# Patient Record
Sex: Female | Born: 1960 | Race: White | Hispanic: No | Marital: Married | Smoking: Current some day smoker
Health system: Southern US, Community
[De-identification: ages and names within clinical notes are randomized; demographics above are authoritative.]

## PROBLEM LIST (undated history)

## (undated) DIAGNOSIS — F329 Major depressive disorder, single episode, unspecified: Secondary | ICD-10-CM

## (undated) DIAGNOSIS — N189 Chronic kidney disease, unspecified: Secondary | ICD-10-CM

## (undated) DIAGNOSIS — E119 Type 2 diabetes mellitus without complications: Secondary | ICD-10-CM

## (undated) DIAGNOSIS — R21 Rash and other nonspecific skin eruption: Secondary | ICD-10-CM

## (undated) DIAGNOSIS — Z9981 Dependence on supplemental oxygen: Secondary | ICD-10-CM

## (undated) DIAGNOSIS — Z7901 Long term (current) use of anticoagulants: Secondary | ICD-10-CM

## (undated) DIAGNOSIS — Z9989 Dependence on other enabling machines and devices: Secondary | ICD-10-CM

## (undated) DIAGNOSIS — J449 Chronic obstructive pulmonary disease, unspecified: Secondary | ICD-10-CM

## (undated) DIAGNOSIS — K219 Gastro-esophageal reflux disease without esophagitis: Secondary | ICD-10-CM

## (undated) DIAGNOSIS — M549 Dorsalgia, unspecified: Secondary | ICD-10-CM

## (undated) DIAGNOSIS — I10 Essential (primary) hypertension: Secondary | ICD-10-CM

## (undated) DIAGNOSIS — J45909 Unspecified asthma, uncomplicated: Secondary | ICD-10-CM

## (undated) DIAGNOSIS — Z9229 Personal history of other drug therapy: Secondary | ICD-10-CM

## (undated) DIAGNOSIS — R519 Headache, unspecified: Secondary | ICD-10-CM

## (undated) DIAGNOSIS — J42 Unspecified chronic bronchitis: Secondary | ICD-10-CM

## (undated) DIAGNOSIS — I509 Heart failure, unspecified: Secondary | ICD-10-CM

## (undated) DIAGNOSIS — G4733 Obstructive sleep apnea (adult) (pediatric): Secondary | ICD-10-CM

## (undated) DIAGNOSIS — M51369 Other intervertebral disc degeneration, lumbar region without mention of lumbar back pain or lower extremity pain: Secondary | ICD-10-CM

## (undated) DIAGNOSIS — F32A Depression, unspecified: Secondary | ICD-10-CM

## (undated) DIAGNOSIS — F419 Anxiety disorder, unspecified: Secondary | ICD-10-CM

## (undated) DIAGNOSIS — I739 Peripheral vascular disease, unspecified: Secondary | ICD-10-CM

## (undated) DIAGNOSIS — R51 Headache: Secondary | ICD-10-CM

## (undated) DIAGNOSIS — G8929 Other chronic pain: Secondary | ICD-10-CM

## (undated) DIAGNOSIS — I519 Heart disease, unspecified: Secondary | ICD-10-CM

## (undated) DIAGNOSIS — M5136 Other intervertebral disc degeneration, lumbar region: Secondary | ICD-10-CM

## (undated) HISTORY — DX: Peripheral vascular disease, unspecified: I73.9

## (undated) HISTORY — DX: Morbid (severe) obesity due to excess calories: E66.01

## (undated) HISTORY — DX: Chronic obstructive pulmonary disease, unspecified: J44.9

## (undated) HISTORY — PX: TONSILLECTOMY: SUR1361

## (undated) HISTORY — PX: BREAST BIOPSY: SHX20

## (undated) HISTORY — DX: Anxiety disorder, unspecified: F41.9

## (undated) HISTORY — PX: CORONARY ANGIOPLASTY: SHX604

## (undated) HISTORY — DX: Heart failure, unspecified: I50.9

## (undated) HISTORY — DX: Major depressive disorder, single episode, unspecified: F32.9

## (undated) HISTORY — DX: Gastro-esophageal reflux disease without esophagitis: K21.9

## (undated) HISTORY — DX: Depression, unspecified: F32.A

## (undated) HISTORY — PX: CHOLECYSTECTOMY: SHX55

---

## 1998-10-18 ENCOUNTER — Inpatient Hospital Stay (HOSPITAL_COMMUNITY): Admission: EM | Admit: 1998-10-18 | Discharge: 1998-10-22 | Payer: Self-pay | Admitting: Emergency Medicine

## 1998-10-18 ENCOUNTER — Encounter: Payer: Self-pay | Admitting: Surgery

## 1998-10-20 ENCOUNTER — Encounter: Payer: Self-pay | Admitting: Surgery

## 1998-11-30 ENCOUNTER — Ambulatory Visit (HOSPITAL_COMMUNITY): Admission: RE | Admit: 1998-11-30 | Discharge: 1998-11-30 | Payer: Self-pay | Admitting: Surgery

## 1998-11-30 ENCOUNTER — Encounter: Payer: Self-pay | Admitting: Surgery

## 1999-07-11 ENCOUNTER — Observation Stay (HOSPITAL_COMMUNITY): Admission: EM | Admit: 1999-07-11 | Discharge: 1999-07-12 | Payer: Self-pay | Admitting: Emergency Medicine

## 1999-07-26 ENCOUNTER — Encounter: Payer: Self-pay | Admitting: Family Medicine

## 1999-07-26 ENCOUNTER — Encounter: Admission: RE | Admit: 1999-07-26 | Discharge: 1999-07-26 | Payer: Self-pay | Admitting: Family Medicine

## 1999-08-21 ENCOUNTER — Encounter: Admission: RE | Admit: 1999-08-21 | Discharge: 1999-11-19 | Payer: Self-pay | Admitting: Family Medicine

## 1999-08-21 ENCOUNTER — Ambulatory Visit: Admission: RE | Admit: 1999-08-21 | Discharge: 1999-08-21 | Payer: Self-pay | Admitting: Critical Care Medicine

## 1999-10-04 ENCOUNTER — Encounter: Admission: RE | Admit: 1999-10-04 | Discharge: 1999-10-04 | Payer: Self-pay | Admitting: Family Medicine

## 1999-10-04 ENCOUNTER — Encounter: Payer: Self-pay | Admitting: Family Medicine

## 2000-02-17 ENCOUNTER — Ambulatory Visit (HOSPITAL_BASED_OUTPATIENT_CLINIC_OR_DEPARTMENT_OTHER): Admission: RE | Admit: 2000-02-17 | Discharge: 2000-02-17 | Payer: Self-pay | Admitting: Family Medicine

## 2000-09-16 ENCOUNTER — Encounter: Payer: Self-pay | Admitting: Family Medicine

## 2000-09-16 ENCOUNTER — Encounter: Admission: RE | Admit: 2000-09-16 | Discharge: 2000-09-16 | Payer: Self-pay | Admitting: Family Medicine

## 2000-12-18 ENCOUNTER — Inpatient Hospital Stay (HOSPITAL_COMMUNITY): Admission: AD | Admit: 2000-12-18 | Discharge: 2000-12-21 | Payer: Self-pay | Admitting: Cardiology

## 2000-12-18 ENCOUNTER — Encounter: Payer: Self-pay | Admitting: Cardiology

## 2000-12-20 ENCOUNTER — Encounter: Payer: Self-pay | Admitting: Cardiology

## 2001-04-09 ENCOUNTER — Ambulatory Visit: Admission: RE | Admit: 2001-04-09 | Discharge: 2001-04-09 | Payer: Self-pay

## 2001-08-14 ENCOUNTER — Ambulatory Visit (HOSPITAL_COMMUNITY): Admission: RE | Admit: 2001-08-14 | Discharge: 2001-08-14 | Payer: Self-pay | Admitting: Cardiology

## 2001-08-15 ENCOUNTER — Ambulatory Visit (HOSPITAL_COMMUNITY): Admission: RE | Admit: 2001-08-15 | Discharge: 2001-08-15 | Payer: Self-pay | Admitting: Critical Care Medicine

## 2001-08-15 ENCOUNTER — Encounter: Payer: Self-pay | Admitting: Critical Care Medicine

## 2001-08-22 ENCOUNTER — Inpatient Hospital Stay (HOSPITAL_COMMUNITY): Admission: EM | Admit: 2001-08-22 | Discharge: 2001-08-25 | Payer: Self-pay | Admitting: Critical Care Medicine

## 2001-08-22 ENCOUNTER — Encounter: Payer: Self-pay | Admitting: Critical Care Medicine

## 2002-06-29 ENCOUNTER — Encounter: Payer: Self-pay | Admitting: Neurosurgery

## 2002-06-29 ENCOUNTER — Encounter: Admission: RE | Admit: 2002-06-29 | Discharge: 2002-06-29 | Payer: Self-pay | Admitting: Neurosurgery

## 2002-06-29 ENCOUNTER — Encounter: Payer: Self-pay | Admitting: Radiology

## 2002-07-16 ENCOUNTER — Encounter: Payer: Self-pay | Admitting: Neurosurgery

## 2002-07-16 ENCOUNTER — Encounter: Admission: RE | Admit: 2002-07-16 | Discharge: 2002-07-16 | Payer: Self-pay | Admitting: Neurosurgery

## 2002-07-27 ENCOUNTER — Encounter: Admission: RE | Admit: 2002-07-27 | Discharge: 2002-10-25 | Payer: Self-pay | Admitting: Endocrinology

## 2002-10-15 ENCOUNTER — Encounter: Admission: RE | Admit: 2002-10-15 | Discharge: 2002-10-15 | Payer: Self-pay | Admitting: Surgery

## 2002-10-15 ENCOUNTER — Encounter: Payer: Self-pay | Admitting: Surgery

## 2003-01-05 ENCOUNTER — Encounter: Admission: RE | Admit: 2003-01-05 | Discharge: 2003-01-27 | Payer: Self-pay | Admitting: Surgery

## 2003-01-14 ENCOUNTER — Encounter: Admission: RE | Admit: 2003-01-14 | Discharge: 2003-04-14 | Payer: Self-pay | Admitting: Endocrinology

## 2003-01-27 ENCOUNTER — Encounter: Admission: RE | Admit: 2003-01-27 | Discharge: 2003-01-27 | Payer: Self-pay | Admitting: Surgery

## 2003-02-03 ENCOUNTER — Encounter: Admission: RE | Admit: 2003-02-03 | Discharge: 2003-02-03 | Payer: Self-pay | Admitting: Surgery

## 2003-03-29 ENCOUNTER — Inpatient Hospital Stay (HOSPITAL_COMMUNITY): Admission: RE | Admit: 2003-03-29 | Discharge: 2003-04-02 | Payer: Self-pay | Admitting: Surgery

## 2003-05-20 ENCOUNTER — Encounter: Admission: RE | Admit: 2003-05-20 | Discharge: 2003-08-18 | Payer: Self-pay | Admitting: Endocrinology

## 2004-01-30 HISTORY — PX: ROUX-EN-Y GASTRIC BYPASS: SHX1104

## 2004-12-08 ENCOUNTER — Ambulatory Visit: Payer: Self-pay | Admitting: Cardiology

## 2004-12-14 ENCOUNTER — Ambulatory Visit: Payer: Self-pay

## 2004-12-15 ENCOUNTER — Ambulatory Visit: Payer: Self-pay

## 2004-12-18 ENCOUNTER — Ambulatory Visit: Payer: Self-pay | Admitting: Pulmonary Disease

## 2005-01-04 ENCOUNTER — Ambulatory Visit (HOSPITAL_COMMUNITY): Admission: RE | Admit: 2005-01-04 | Discharge: 2005-01-04 | Payer: Self-pay | Admitting: Cardiology

## 2005-01-04 ENCOUNTER — Ambulatory Visit: Payer: Self-pay | Admitting: Cardiology

## 2007-02-03 ENCOUNTER — Other Ambulatory Visit: Payer: Self-pay | Admitting: Emergency Medicine

## 2007-02-04 ENCOUNTER — Ambulatory Visit: Payer: Self-pay | Admitting: Emergency Medicine

## 2007-02-04 ENCOUNTER — Inpatient Hospital Stay (HOSPITAL_COMMUNITY): Admission: EM | Admit: 2007-02-04 | Discharge: 2007-02-08 | Payer: Self-pay | Admitting: Emergency Medicine

## 2007-03-13 DIAGNOSIS — R51 Headache: Secondary | ICD-10-CM

## 2007-03-13 DIAGNOSIS — I519 Heart disease, unspecified: Secondary | ICD-10-CM | POA: Insufficient documentation

## 2007-03-13 DIAGNOSIS — E119 Type 2 diabetes mellitus without complications: Secondary | ICD-10-CM | POA: Insufficient documentation

## 2007-03-13 DIAGNOSIS — I10 Essential (primary) hypertension: Secondary | ICD-10-CM | POA: Insufficient documentation

## 2007-03-13 DIAGNOSIS — E669 Obesity, unspecified: Secondary | ICD-10-CM

## 2007-03-13 DIAGNOSIS — R519 Headache, unspecified: Secondary | ICD-10-CM | POA: Insufficient documentation

## 2007-03-13 DIAGNOSIS — G4730 Sleep apnea, unspecified: Secondary | ICD-10-CM | POA: Insufficient documentation

## 2007-03-13 DIAGNOSIS — I200 Unstable angina: Secondary | ICD-10-CM

## 2007-03-13 DIAGNOSIS — I509 Heart failure, unspecified: Secondary | ICD-10-CM | POA: Insufficient documentation

## 2007-03-13 DIAGNOSIS — K219 Gastro-esophageal reflux disease without esophagitis: Secondary | ICD-10-CM | POA: Insufficient documentation

## 2007-04-16 ENCOUNTER — Encounter: Admission: RE | Admit: 2007-04-16 | Discharge: 2007-04-16 | Payer: Self-pay | Admitting: Family Medicine

## 2008-01-16 ENCOUNTER — Encounter: Admission: RE | Admit: 2008-01-16 | Discharge: 2008-01-16 | Payer: Self-pay | Admitting: Family Medicine

## 2008-09-23 ENCOUNTER — Encounter: Admission: RE | Admit: 2008-09-23 | Discharge: 2008-09-23 | Payer: Self-pay | Admitting: Family Medicine

## 2008-09-29 ENCOUNTER — Encounter: Admission: RE | Admit: 2008-09-29 | Discharge: 2008-11-15 | Payer: Self-pay | Admitting: Family Medicine

## 2009-09-23 ENCOUNTER — Encounter: Admission: RE | Admit: 2009-09-23 | Discharge: 2009-09-23 | Payer: Self-pay | Admitting: Gastroenterology

## 2009-09-28 ENCOUNTER — Ambulatory Visit (HOSPITAL_COMMUNITY): Admission: RE | Admit: 2009-09-28 | Discharge: 2009-09-28 | Payer: Self-pay | Admitting: Gastroenterology

## 2010-02-18 ENCOUNTER — Encounter: Payer: Self-pay | Admitting: Surgery

## 2010-02-19 ENCOUNTER — Encounter: Payer: Self-pay | Admitting: Surgery

## 2010-06-13 NOTE — H&P (Signed)
NAMEDENNICE, TINDOL                ACCOUNT NO.:  [id number]   MEDICAL RECORD NO.:  [id number]          PATIENT TYPE:  INP   LOCATION:  1825                         FACILITY:  [facility]   PHYSICIAN:  Leslye Peer, MD    DATE OF BIRTH:  03-12-1960   DATE OF ADMISSION:  02/04/2007  DATE OF DISCHARGE:                              HISTORY & PHYSICAL   HISTORY OF PRESENT ILLNESS:  Ms. Ketchem is a 50 year old Caucasian lady  with a history of diabetes mellitus, hypertension, obstructive sleep  apnea, diastolic dysfunction with ejection fraction of 60%, minimal  coronary artery disease with last catheterization done in December 2006,  showing 30% stenosis of the circumflex artery, morbid obesity with BMI  of 48, and tobacco abuse; was referred to the emergency department of  the [facility] by her primary care doctor, Dr. Gerri Spore.  It all  started with the flu-like symptoms about a week and half ago.  She has  fever, chills, sweating, crampy pain all over her body, especially in  the abdomen and the legs.  She also has increased frequency of urination  and burning micturition.  For the last 24 hours, she was also delirious  according to her husband's own words.  She was also very weak and dizzy,  but there is no loss of consciousness.  She also has a right flank pain,  which is achy in nature, which is 10/10 and radiates to the groin.  There is no aggravating or relieving factor.  She has nausea, but no  vomiting.  Her bowel movement is okay.  She also complains of having a  continuous vaginal bleeding since January 13, 2007, which is about 20  days.  The last menstrual period prior to this bleeding was about 2  years ago.  She does have a history of regular menses in the past.   ALLERGY HISTORY:  She is allergic to CODEINE, which makes her sick.   PAST MEDICAL HISTORY:  1. Diabetes mellitus.  2. Hypertension.  3. Obstructive sleep apnea.  4. Diastolic dysfunction with EF of 60%.  5. Minimal CAD with 30% stenosis of the circumflex artery per cardiac      catheterization on December 2006.  6. Morbid obesity.  7. Tobacco abuse.  8. GERD.  9. Fatty liver.  10.Status post cholecystectomy and status post laparoscopic Roux-en-Y      gastric bypass.  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  12.Complex left ovarian cyst diagnosed in 2002, by transvaginal      ultrasound.   HOME MEDICATIONS:  Neither the patient nor her husband recall what  medication she takes and they will bring the list when her family comes  to the hospital next time.   SOCIAL HISTORY:  Ms. Brasington smokes about a pack per day and had been  doing so since last 10 years.  There is a normal regular use of alcohol.  There is no illicit drugs use.   FAMILY HISTORY:  Significant for her father who was deceased at 16  because of heart attack and mother who was deceased at 23s  because of  breast cancer.  Her one sister is healthy.  She does not have any  children.   REVIEW OF SYSTEMS:  According to history of present illness and in  addition positive for sick contact, her whole family was having flu-like  symptoms recently.  Some chest pain, shortness of breath with cough, and  on and off nature of sputum without any color.  There is no bleeding  from anywhere else and there is no rash.   PHYSICAL EXAMINATION:  VITAL SIGNS:  Temperature 102.1, respirations 28,  heart rate 127, and blood pressure 101/69.  Saturating 90 on room air.  GENERAL:  Toxic looking, sweaty, in mild to moderate distress.  ENT:  Occluded tongue, dehydrated oropharynx, but otherwise no any  discharge.  NECK:  No mass.  No cervical lymphadenopathy.  CHEST:  Bilaterally clear to auscultation.  No crepitation or wheezes.  HEART:  First and second heart sounds are normal.  Regular rate and  rhythm.  No rubs, murmurs, or gallops.  ABDOMEN:  Bowel sound is normal.  Soft and nontender.  No organomegaly.  No masses.  SKIN:  No rashes.   LYMPH NODE:  Negative in the cervical area and the axillary area.  NEUROLOGIC:  She is slightly confused, but easily arousable.  She is  oriented to person and to the situation, but not to time and place.  Motor examination, power is 5/5 of all 4 limbs.  Bilateral light touch  sensory examination is symmetrical and normal.  Cranial nerves II  through XII was normal.  Cerebellar signs:  Finger-to-nose bilaterally  is normal and symmetrical.  The meningeal irritation sign is negative.  Negative Brudzinski and Kernig signs.  Neck is supple.  Babinski sign is  bilaterally downgoing.   LABS ON ADMISSION:  Blood gas with pH of 7.84, PCO2 29.1, PO2 79,  bicarbonate 21.6, and serum creatinine of 2.1.  UA positive for orange  color; turbid in appearance; 100 glucose, moderate bilirubin, 15  ketones, moderate blood, 100 protein, positive nitrite, and large  leukocytes.  Urine microscopy has few squamous epithelial cells with WBC  of 21-50 and RBC 7-10 with bacteria many.  Her CBC with the diff; WBC  29.6, hemoglobin 15.2, hematocrit 44.6, platelets are 456,000,  neutrophils 92% with absolute neutrophil count 27.2, and absolute  monocyte count high at 1.2.  Her CMP; sodium 133, potassium 3.3,  chloride 99, bicarb 23, and glucose 288, BUN 28, creatinine 2, total  bilirubin 1.4, alkaline phosphatase 64, AST 15, ALT 21, total protein  7.6, blood albumin 3.1, and calcium 8.7.  LDH 139, blood acetaminophen  level less than 10, salicylate level less than 4, alcohol level less  than 5.  Urine drug screen negative.  Urine tricyclic screen also  negative.  Venous lactic acid level 2.  First set of cardiac enzymes of  troponin 0.4, total CK 102, CK-MB 3.5 with relative index of 3.4.  BNP  38 and random cortisol level 18.1.  Portable chest x-ray showed  symmetric and bibasilar opacities.   ASSESSMENT AND PLAN:  1. Severe sepsis.  A 50 year old lady coming with urinary frequency,      dysuria, right flank  pain radiating to groin, fever, chills,      sweating, nausea, increased WBC count, increased WBC in urine,      positive leukocyte esterase and plenty of bacteria in urine, all      are in favor of urosepsis.  She also has a little  confusion and is      oriented only to person and situation as well as she has rise of      creatinine from 1.21 yesterday to 2 today and fluctuating systolic      blood pressure between 90-150, indicating some evidence of organ      dysfunction including the kidney, brain, and the cardiovascular      system.  She is not floridly in shock, but she is at the brink.  So      we will send blood cultures x2, urine culture, sputum program, and      stain culture.  We will also send urine antigen for pneumococcus      and Legionella.  She also has mild bibasilar opacities, which can      be infective in nature, but I do not believe it is the main cause      of the septic picture.  I will treat her presently empirically for      urosepsis, but we will also cover for the possible pneumonia with      Levaquin and switch the antibiotics following the sensitivity      result.  I will also put her on ICU with sepsis protocol and let me      remind if she is admitted to the ICU.  We will also check cardiac      enzymes and check an EKG once she is in the unit and also given      aspirin.  We will also give her moderate amount of fluid, and the      target is central venous pressure is between 8-12 and adjust the      fluid accordingly.  If the target could not be obtained after the      fluids, we will start her on pressors.  I will also give her oxygen      and I will put her on CPAP at night time.  2. Vaginal bleeding.  Her hemoglobin is okay at presentation, but she      is bleeding for the last 20 days and her last menstrual period was      2 years prior to this one.  So, I will just type and cross 2 units      of packed RBC, but we will not transfuse her right now and  my      threshold would be hemoglobin of 8 for transfusion.  She does need      a Gynecology referral once she is stable most probably at the time      of discharge.  3. Sleep apnea.  She will continue with the CPAP at night time.  4. Chronic obstructive pulmonary disease.  She will use the albuterol      and Atrovent nebulizers p.r.n. and I will also start her on Spiriva      inhaler.  5. Diabetes.  I will put her on sliding scale sensitive regimen.  6. Tobacco abuse.  She need some form of counseling and motivation for      smoking cessation after she is stable.  7. Hypertension and diastolic dysfunction.  We will follow her blood      pressure.  8. Gastroesophageal esophageal reflux disease.  We will give her      Protonix.  9. Degenerative spinal disease.  We will not be that moderate to give      her pain medications, as her blood pressure  still runs sometimes in      the lower range of normal.  10.Prophylaxis.  We will put her on Lovenox for DVT prophylaxis and      also put her on Protonix for a stress ulcer.  We will also check      lipase and amylase just to rule out pancreatitis for the cause of      abdominal pain, which is very unlikely.      Jason Coop, MD  Electronically Signed      Leslye Peer, MD  Electronically Signed    YP/MEDQ  D:  02/04/2007  T:  02/05/2007  Job:  [id number]   cc:   Otilio Connors. Gerri Spore, M.D.  Leslye Peer, MD

## 2010-06-13 NOTE — Discharge Summary (Signed)
NAMETACHE, BOBST                ACCOUNT NO.:  [id number]   MEDICAL RECORD NO.:  [id number]          PATIENT TYPE:  INP   LOCATION:  2622                         FACILITY:  [facility]   PHYSICIAN:  Nelda Bucks, MD DATE OF BIRTH:  1960/10/15   DATE OF ADMISSION:  02/04/2007  DATE OF DISCHARGE:  02/08/2007                               DISCHARGE SUMMARY   DISCHARGE DIAGNOSES:  1. Septic shock with urosepsis, possibly pyelonephritis.  2. Acute renal failure.  3. Vaginal bleeding unknown cause.  4. Diabetes mellitus.   OTHER INACTIVE DIAGNOSES:  1. Hypertension.  2. Obstructive sleep apnea.  3. Diastolic dysfunction with ejection fraction of 60%.  4. Minimal coronary artery disease with 30% stenosis of the circumflex      artery with cardiac catheterization done in December 2006.  5. Morbid obesity with body mass index of 48.  6. Tobacco abuse.  7. Gastroesophageal reflux disease.  8. Fatty liver, status post cholecystectomy and status post      laparoscopic Roux-en-Y gastric bypass.  9. Degenerative joint disease of the spine.   DISCHARGE MEDICATIONS:  1. Prevacid 30 mg p.o. b.i.d.  2. Ciprofloxacin 500 mg p.o. b.i.d. for a total of 7 days.  3. Spiriva 18 mcg inhalation p.o. daily.  4. Neurontin 600 mg p.o. t.i.d.  5. Percocet 5/325 mg p.o. p.r.n. q.i.d.  6. Promethazine 25 mg p.o. p.r.n. for nausea and vomiting every 6      hours.  7. Cymbalta 60 mg p.o. daily.  8. Metformin XR 500 mg p.o. 2 pills b.i.d.  9. NitroQuick 0.4 mg p.o. p.r.n. every 5 minutes x3.  10.Tizanidine HCl 2 mg p.o. b.i.d.  11.Continue her home daily vitamin B12 and iron pills.  12.Tylenol 650 mg p.o. p.r.n. for temperature more than 108 Fahrenheit      every 6 hours.   DISPOSITION AND FOLLOWUP:  Ms. Ryer is discharged home.  She will  follow up with her primary care doctor on coming Monday which is on  February 10, 2007.  His primary care doctor needs to follow up on certain  issues including  resolution of the septic shock and any urinary symptoms  following on blood pressure, temperature, and heart rate.  Ms. Gonsalves  also needs a CBC on her followup visit with BMET.  On CBC, please check  on WBC count as well as hemoglobin.  On BMET, please check on potassium  and creatinine.   She needs to be seen by Dr. Noland Fordyce from [facility] for having  vaginal bleed since January 13, 2007, after not having periods for  about 2 years.  During this hospitalization, she had transvaginal  ultrasound done which shows normal left ovary and uterus, right ovary  not visualized.  No masses or free fluid.  Her TSH was normal at 1.046,  LH at 2.2, and FSH at 4.2.  There is a concern for cancer because of the  postmenopausal bleed.   Ms. Mount is instructed to call the doctor's office if she has a  temperature of 101, and this has been greatly emphasized to  her.   PROCEDURES PERFORMED DURING THIS HOSPITALIZATION:  Renal ultrasound done  on February 05, 2007, is negative for any hydronephrosis, but there is  fatty infiltration of the liver.  Urine culture was positive for E.  coli, which the patient was sensitive to cefazolin, ceftriaxone,  ciprofloxacin, gentamicin, levofloxacin, nitrofurantoin, tobramycin, and  resistant to Bactrim and ampicillin.  Her blood cultures x2 was also  positive for E. coli, which was resistant to Bactrim and ampicillin, but  sensitive to a combination of ampicillin, sulbactam, cefazolin,  cefepime, cefotetan, ceftazidime, ceftriaxone, ciprofloxacin,  gentamicin, imipenem, Zosyn, and tobramycin.  She also had right IJ  central venous line placed on February 04, 2007, which was discharged on  February 08, 2007.   CONSULTATIONS:  Pharmacy PT/OT.   ADMITTING HISTORY AND PHYSICAL:  Ms. Chinchilla is a 50 year old Caucasian  lady with a history of diabetes mellitus, hypertension, obstructive  sleep apnea, diastolic dysfunction with EF of 60%, minimal coronary  artery  disease with last cath done in December 2006 with 30% stenosis of  the circumflex artery, morbid obesity with BMI of 48, tobacco abuse, was  referred to the emergency department of [facility] by her primary  care doctor, Dr. Gerri Spore.  It also started with flu-like symptoms  about a week and a half ago.  She has fever, chills, sweating, and  crampy pain all over her body, especially in the abdomen and the legs.  She also has a decreased frequency of urination and burning micturition.  For the last 24 hours, she was also delirious according to her husband's  own words.  She was also very weak and dizzy.  There is no loss of  consciousness.  She also had right flank pain, which is achy in nature,  which is 10/10 and radiates to the groin.  There is no aggravating or  relieving factors.  She has nausea, but no vomiting.  Her bowel movement  is okay.  She also complains of having continuous vaginal bleed since  January 13, 2007, which is about 20 days prior to admission.  The last  menstrual period prior to this bleeding was about 2 years ago.  She does  have a history of irregular menses in the past.   PHYSICAL EXAMINATION:  VITAL SIGNS:  Temperature 102.1, respirations 28,  heart rate 127, and blood pressure 101/69.  Satting 90% on room air.  GENERAL:  She was toxic looking, sweaty, in mild to moderate distress.  ENT:  Tongue was coated and dehydrated, oropharynx with otherwise no any  discharge.  NECK:  No mass.  No cervical lymphadenopathy.  CHEST:  Bilaterally clear to auscultation.  No crackles or wheezes.  HEART:  First and second heart sounds are normal.  Regular rate and  rhythm.  No rubs.  Normal gallops.  ABDOMEN:  Bowel sounds normal.  Soft and nontender.  No organomegaly.  No masses.  SKIN:  No rashes.  LYMPH NODE:  Negative in the cervical and axillary area.  NEUROLOGIC:  Confused slightly, but easily arousable.  She is oriented  to person at this situation, but not to  time and place.  Motor  examination, power is 5/5 in all 4 limbs and bilateral light touch.  Sensory examination is symmetrical and normal.  Cranial nerves II  through XII are normal.  Cerebellar signs, finger-to-nose bilaterally  normal and symmetrical.  The meningeal irritation sign is negative.  Negative Babinski and Kernig sign.   LABS AT ADMISSION:  Blood gas;  pH 7.84, pCO2 of 29.1, pO2 of 79, bicarb  21.6, and creatinine 2.1.  UA positive for orange color turbid  appearance, no glucose, moderate bilirubin, 15 ketones, moderate blood,  positive nitrite, and large leukocytes.  Urine microscopy had a few  squamous epithelial cells with wbc of 21-50 and rbc of 7-15 with  bacteria many.  CBC with WBC of 29.6, hemoglobin 15.2, hematocrit 44.6,  platelets 456,000, and absolute neutrophil count 27.2.  Sodium 133,  potassium 3.3, chloride 99, bicarb 23, glucose 288, BUN 28, creatinine  2, total bilirubin 1.4, alk phos 64, AST 15, ALT 21, total protein 7.6,  albumin 3.1, calcium 8.7, LDH 139, acetaminophen level less than 10,  salicylate level less than 4, and alcohol level less than 5.  UDS  negative.  Urine tricyclic screen negative, venous lactic acid level 2,  cardiac enzymes first set within normal limits.  BNP 13 and random  cortisol 18.1.  Portable chest x-ray showed symmetric and bibasilar  opacities.   HOSPITAL COURSE:  1. Septic shock? Pyelonephritis:  The patient's blood pressure was low      as in the lower 80s systolic, but she did not require any      pressures, and she did overall well with IV fluids and antibiotics.      She was initially started on Levaquin because her WBC count was      persistently high.  Vancomycin and ceftazidime was also added.      Consequently, her urine culture and blood culture both were      positive for E. Coli.  On day 3 of admission, antibiotics were      changed to ciprofloxacin IV, which was one of the sensitive      antibiotics.  We did an  renal ultrasound, which was negative for      any obstruction or hydronephrosis.  We also checked random cortisol      level to rule out hypercortisolism as a cause of shock, but it was      normal.  We also checked cardiac enzymes which was normal.  Her EKG      was also not impressive.  Urine drug screen including acetaminophen      blood level and alcohol level was all within normal limits.  Urine      tricyclic screen was also within normal limits.  Her lactic acid      level was also within normal limits.  Her BNP was also within      normal limits.  Her amylase and lipase level was also within normal      limits.  We also did streptococcus pneumoniae urinary antigen and      Legionella urinary antigen, both were within normal limits.  2. Acute renal failure:  When she came, her creatinine was 2.1 and on      the day of discharge, her creatinine was 1.19 and it gradually      resolved with fluid repletion and treatment of shock.  3. Vaginal bleeding:  She had vaginal bleeding for about last 28 days      after not having periods for about 2 years.  Transvaginal      ultrasound was within normal limits except the right ovary was not      visualized.  The uterus was within normal limits.  The left ovary      was within normal limits.  We also obtained FSH, LH, and TSH level.  FSH and LH were at the lower limits of normal.  TSH was also      normal.  She will follow up with Dr. Noland Fordyce from [facility]      [facility] in 2 weeks to 1 month after discharge.  4. Diabetes mellitus type 2:  We did not continue her home oral      medication.  We rather started her on a sliding scale and also      added Lantus the dose of which has to be increased because of      higher CBGs.   LABS ON DISCHARGE:  WBC 16.7, hemoglobin 11.4, MCV 95.6, RDW 13.2, and  platelets 410,000.  Sodium 137, potassium 3.6, chloride 107, bicarb 23,  glucose 223, BUN 16, creatinine 1.19, and calcium 8.2.  UA normal  except  large amount of blood.  Urine microscopy, rbc 11-20 with wbc of 3-6.   VITALS ON DISCHARGE:  Temperature 96.6, pulse 73, respirations 22, and  blood pressure 150/76.  Satting 98% on room air.   CONDITION ON DISCHARGE:  Had 1 spike of fever of 100; otherwise making a  lot amount of urine and looking a lot better than at the time of  admission.      Jason Coop, MD  Electronically Signed      Nelda Bucks, MD  Electronically Signed    YP/MEDQ  D:  02/08/2007  T:  02/09/2007  Job:  [id number]   cc:   Otilio Connors. Gerri Spore, M.D.  Leslye Peer, MD  Lendon Colonel, MD  Nelda Bucks, MD

## 2010-06-16 NOTE — H&P (Signed)
[place]. [facility]  Patient:    Stephanie Peck, Stephanie Peck                       MRN: [id number] Adm. Date:  [id number] Attending:  Doug Peck Dictator:   Stephanie Peck, N.P. CC:         Stephanie Peck. Stephanie Peck, M.D./[facility]                         History and Physical  DATE OF BIRTH:  September 01, 2060.  HISTORY OF PRESENT ILLNESS:   Stephanie Peck is a pleasant obese 50 year old female  with positive family history of CAD and positive personal use tobacco.  She has a one week complaint of malaise with initial mid-back pain described as sharp and  associated with deep inspiration.  That discomfort lasted approximately two days, then resolved.  She had no associated radiation, discomfort or diaphoresis.  She has been complaining of nausea over the last week, more severe yesterday.  Dizziness over the weekend (two days earlier), which has also resolved.  She noted her hands swelling, but denied abdominal swelling nor pedal edema.  This morning, she awoke acutely short of breath with subsequent orthopnea.  Noted mild anterior chest tightness, thought  related to her shortness of breath.  Denied palpitations.  She reported to her primary M.D.s office where a chest x-ray revealed CHF.  EMS was summoned and she was given IV Lasix 90 mg and aspirin as well as sublingual nitrates.  She subsequently diuresed approximately three liters of urine and felt much improved. O2 sat improved to 96% room air from initial 88% room air and currently feeling  okay and pain-free.  CARDIAC RISK FACTORS:  Positive family history for CAD, positive ongoing tobacco use and obesity.  PAST MEDICAL HISTORY: 1. Diverticulosis. 2. Cholecystectomy. 3. Right breast biopsy, which was benign.  The patient denies a history of diabetes mellitus, thyroid disease nor cancer.  ALLERGIES:  CODEINE causing nausea; she has photo-sensitivity causing  hives.  MEDICATIONS:  Prevacid 30 mg p.o. q.d.  SOCIAL HISTORY:  She has been married for eight years without children. Tobacco: 1/2 to 2 packs q.d. for 20 years.  ETOH negative.  Caffeine not excessive.  The  patient works for [employer].  FAMILY HISTORY:  Father deceased, age 69; myocardial infarction.  He had prior bypass surgery, with a history of diabetes mellitus and hypertension.  Mother deceased, age 66; breast cancer.  Sister is alive and well.  REVIEW OF SYSTEMS:  Full upper dentures and partial lower.  Episodic dizziness associated with decreased p.o. intake.  Denies dysphagia with food or fluids. Negative melena, no bright red blood p.r.  Episodic constipation and diarrhea.  Does complain of nocturnal leg cramps/restless legs.  Negative arthritis. Denies pedal edema.  Denies orthopnea but does sleep with head of bed elevated somewhat. Rare episodic palpitations, nonsustained, and no associated symptoms.  Denies dysuria or hematuria.  PHYSICAL EXAMINATION:  GENERAL:  She is an obese, pleasantly conversant middle-aged female in no apparent distress.  Her husband is in attendance.  VITAL SIGNS:  Blood pressure 140/90 initially and subsequently 198/105, respiratory rate 24 and room air O2 saturation initially 88% and currently 96%  HEAD, EARS, EYES, NOSE AND THROAT:  Brisk bilateral carotid upstroke without bruit.  NECK:  No significant JVD,  no thyromegaly.  CHEST:  Left base crackles; otherwise clear.  Negative CVA tenderness. Inspiratory squeak, mid and upper right lobe.  CARDIAC:  Soft S4 at base; regular rate and rhythm.  Normal S1 and S2.  ABDOMEN:  Obese, nondistended, normoactive bowel sounds.  Negative abdominal aorta, renal nor femoral bruit.  Nontender to palpation.  EXTREMITIES:  +1-2/4 pedal, femoral, dorsalis pedis and posterior tibial. Negative pedal edema.  NEUROLOGIC:  Cranial nerves II-XII grossly intact; alert and oriented x  3.  GENITORECTAL:  Exam deferred.  LABORATORY:  Today - sodium 138, K 3.6, chloride 102, CO2 was 28, BUN 7, creatinine 0.7, glucose 109.  LFTs within normal range.  CBC revealed WBC elevated at 12.7, hemoglobin 14.7, platelets elevated at 499,000.  EKG reveals NSR at 96 beats/minute with T-wave inversion in AVL; no acute ischemic changes.  CK #1 was 457 with MB fraction 1.7 and Troponin-I of 0.03.  Chest x-ray reveals CHF.  IMPRESSION:  A 50 year old female with: 1. Acute congestive heart failure - with a history of pleuritic chest pain one eek prior; some anterior chest tightness this morning.  Cardiac risk factors include: obesity and positive family history of coronary artery disease and ongoing tobacco abuse.  Differential diagnoses includes myocardial ischemia and pulmonary emboli. 2. History of diverticulosis. 3. Borderline low potassium; likely exacerbated after diuresis. 4. Thrombocytosis with platelets of 399,000.  PLAN: 1. Admit to telemetry; rule out MI protocol with serial cardiac enzymes/daily EKG. Follow O2 saturations and diurese p.r.n. 2. Topical nitrates and aspirin. 3. Will plan for coronary angiography in the morning, with possible percutaneous intervention.  Diagnostic by Stephanie Peck with possible percutaneous intervention with Stephanie Peck, if necessary. The risks, potential complications, benefits and alternatives of the procedure ere discussed in detail.  [name] indicates her questions and concerns have been  addressed and is agreeable to proceed. 4. Will check D-dimer; if positive, will anticipate chest CT - rule out pulmonary emboli protocol if cath negative. 5. Supplement K. 6. Follow-up BMET and CBC in the morning. DD:  07/11/99 TD:  07/11/99 Job: [id number]

## 2010-06-16 NOTE — Discharge Summary (Signed)
Stephanie Peck, Stephanie Peck                          ACCOUNT NO.:  [id number]   MEDICAL RECORD NO.:  [id number]                   PATIENT TYPE:  INP   LOCATION:  0479                                 FACILITY:  [facility]   PHYSICIAN:  Selina Cooley, M.D.               DATE OF BIRTH:  08-20-1960   DATE OF ADMISSION:  08/22/2001  DATE OF DISCHARGE:  08/24/2001                                 DISCHARGE SUMMARY   DISCHARGE DIAGNOSES:  1. Nausea, vomiting and lower quadrant abdominal pain, probable viral     gastroenteritis.     a. Abdominal CT negative for diverticulitis, revealing fatty infiltration        of the liver.     b. Lipase and amylase normal.     c. C. difficile study negative x 1.     d. Blood cultures no growth to date.  2. Dehydration.  3. Severe hypokalemia.  Potassium was 2.6 on admission.  3.2 at the time of     discharge.  4. Dyspnea on exertion, chronic progressive.     a. Extensive workup including a CT of the chest on July 18 negative for        PE revealing mediastinal lymphadenopathy.     b. According to patient, formal sleep study revealing sleep apnea but no        need for CPAP.     c. Admission blood gas 7.55/33/74/29 96%.     d. Cardiac workup including catheterization in 2001 with normal coronary        arteries.  Echocardiogram revealing severe left ventricular diastolic        dysfunction, EF of 50 with mild diffuse hypokinesis.  5. Obesity.  6. History of hypertension.  7. Tobacco use.  8. Fatty liver by CT this admission.  9. Status post cholecystectomy.  10.      Diabetes mellitus type 2.  11.      Gastroesophageal reflux disease.  12.      Ventral hernia by exam and confirmed by CT this admission.  13.      Admission leukocytosis of 15.7.  14.      Intentional 30 pound weight loss.   DISCHARGE MEDICATIONS:  Increase potassium chloride to 40 mEq b.i.d.  No  other medication changes.  Prevacid 30 mg p.o. b.i.d., Celexa 40 mg p.o.  q.d., Glucophage XR  1000 mg p.o. b.i.d., Diovan 80 mg p.o. q.d., Clarinex 5  mg p.o. q.d., Reglan 10 mg p.o. b.i.d. p.r.n., Lasix 80 mg p.o. t.i.d. start  7/29, Zaroxolyn 2.5 mg p.o. q.d., start 7/29, Tussionex as needed.  ADA diet  of 2000 calories and the patient was advised to stop smoking.  Follow up  with Dr. Gerri Spore on Friday, August 1 at 10:20 A.M.  Would recommend  checking a BMET specifically potassium.  Discharge potassium at 3.3.   CONDITION ON DISCHARGE:  The patient  was stable tolerating p.o. with stable  vital signs.   REASON FOR ADMISSION:  The patient is a 50 year old white female with above  mentioned medical problems who was admitted with acute onset of lower  quadrant abdominal pain, nausea and vomiting.   HOSPITAL COURSE:  1. Lower quadrant abdominal pain, nausea and vomiting.  The patient has a     diagnosis of diverticulosis with prior episodes of diverticulitis.  For     this reason she underwent an abdominopelvic CT which was negative for     diverticulitis.  It did confirm fatty liver and a ventral hernia but no     acute process.  The patient was empirically started on IV Flagyl and     Cipro.  She did improve.  The patient also reported relatively recent     antibiotics prior to admission for URI type symptoms.  C. difficile was     negative x 1.  The patient at the time of discharge was tolerating a p.o.     diet endorsing that all of her admitting symptoms of nausea, vomiting,     diarrhea, and abdominal pain had resolved.  It was felt that this is most     likely due to a viral gastroenteritis.  Therefore, antibiotics were not     continued at the time of discharge.  She will follow up with her primary     care Giann Obara to confirm cessation of these symptoms.   1. Marked dehydration with severe hypokalemia.  The patient did require a     lot of potassium chloride replacement.  She is still slightly hypokalemic     at 3.3 at the time of discharge, but will continue doubling  up on her     potassium chloride at home to 40 mEq twice a day.  I advised her to     continue doing that until she sees her physician.   1. Dyspnea on exertion.  The patient describes progressive decline in her     stamina and worsening dyspnea on exertion especially over the last year.     Reviewing her records, it seems that she has had a pretty extensive     cardio and pulmonary workup including echocardiogram, catheterization,     sleep studies, chest CT as well as blood work.  I did review all of this     data and suggested that she continue in her efforts to lose weight as I     think her obesity is contributing to certainly her pulmonary issues and     her exercise stamina.  I do not have records of her sleep study.  It may     merit being repeated if it is long ago enough so that things may have     worsened and she may benefit from BiPAP or CPAP.  I will defer to her     primary care Kenney Going to make this decision given that she probably has     those records.   Other issues were relatively stable this admission.  The patient will be  discharged to home.                                                Selina Cooley, M.D.    AB/MEDQ  D:  08/25/2001  T:  08/30/2001  Job:  [id number]   cc:   Carola J. Gerri Spore, M.D.   Charlcie Cradle Delford Field, M.D. [facility]

## 2010-06-16 NOTE — Op Note (Signed)
[name], Stephanie Peck                          ACCOUNT NO.:  [id number]   MEDICAL RECORD NO.:  [id number]                   PATIENT TYPE:  INP   LOCATION:  0153                                 FACILITY:  [facility]   PHYSICIAN:  Thornton Park. Daphine Deutscher, M.D.             DATE OF BIRTH:  September 21, 1960   DATE OF PROCEDURE:  03/30/2003  DATE OF DISCHARGE:                                 OPERATIVE REPORT   PREOPERATIVE DIAGNOSIS:  Postoperative nausea and vomiting and incarcerated  umbilical hernia.   POSTOPERATIVE DIAGNOSIS:  Incarcerated umbilical hernia with small bowel  obstruction.   SURGEON:  Thornton Park. Daphine Deutscher, M.D.   ASSISTANT:  Gita Kudo, M.D.   ANESTHESIA:  General endotracheal anesthesia.   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  She had had some nausea and  vomiting during the night and on her upper GI this morning, her pouch filled  and only partially emptied, and she had __________ and dilated loops of  small bowel.  After reviewing this and on examination, it felt like she had  periumbilical mass consistent with a recurrent incarcerated umbilical  hernia.  I discussed this with her and her husband and am bringing her to  the OR at this time to repair that.   DESCRIPTION OF PROCEDURE:  She was taken back to room #6 and given general  anesthesia.  Abdomen was prepped with Betadine and draped sterilely.  A  longitudinal incision was made over this mass and I cut down to the sac and  entered it carefully.  I found two loops of bowel; one which was more of a  purplish and a contused look was the point of obstruction along with another  dilated loop of bowel.  I opened the fascia inferiorly enough to allow this  to be free and brought the bowel up into the wound and looked at it.  I  could see the cut-off point where it was obstructed completely.  The bowel,  with a little bit of time, pinked up and  looked much better.  I returned  everything to the abdominal cavity.  I did not see any other problems that I  could appreciate including problems with trocar sites and this was well away  from her gastric anastomosis.  Jackson-Pratt drainage has been  serosanguineous.  I went ahead and then closed her with interrupted #1  Prolene from above, also closing the small umbilical hernia with this.  I  put my finger in as I tied to prevent entrapment of bowel and all sutures  were placed and examined and were in good position prior to being tied.  Multiple square knots were thrown and a good secure closure was present.  The wound was irrigated and 4-0 Vicryl was used in the subcutaneous tissue  and then the skin was closed with a  stapler.  The patient seemed to  tolerated the procedure well and will be taken to the recovery room and then  back to the ICU/step-down unit.   FINAL DIAGNOSIS:  Incarcerated umbilical hernia producing small bowel  obstruction.                                               Thornton Park Daphine Deutscher, M.D.    MBM/MEDQ  D:  03/30/2003  T:  03/30/2003  Job:  [id number]

## 2010-06-16 NOTE — H&P (Signed)
[facility]  Patient:    Stephanie Peck, Stephanie Peck Visit Number: [id number] MRN: [id number]          Service Type: MED Location: [id number] Attending Physician:  Katy Apo. Dictated by:   Charlcie Cradle Delford Field, M.D. [facility] Admit Date:  08/22/2001   CC:         Carola J. Gerri Spore, M.D.   History and Physical  CHIEF COMPLAINT:  Nausea, vomiting, diarrhea and abdominal pain.  HISTORY OF PRESENT ILLNESS:  This is a 50 year old white female whom I have been following in the pulmonary clinic for evaluation of dyspnea.  The patient was first seen in evaluation for this dyspnea on August 16, 1999.  At that time, we felt the patient had dyspnea on the basis of cardiomyopathy, also felt that the patient had evidence of reflux and did not have any primary lung process. We also thought the angiotensin converting enzyme inhibitor that she had been on previously for the cardiomyopathy resulted in a chronic cough.  The patient was seen again in March of 2003, still having dyspnea, still having cough. Again, I felt it was diastolic dysfunction, cardiomyopathy restrictive chest wall disease, obesity, gastroesophageal reflux disease and vocal cord dysfunction syndrome.  Did not think the patient had any primary intrinsic lung disease.  Subsequent to that March 2003 office visit, she was seen again in June of 2003, and at that time she was doing better. There were no new problems.  We kept her off the ACE inhibitor, doubled her Prevacid and maintained her on the Prevacid 30 mg b.i.d. which she states has helped significantly her reflux.  The lisinopril has been off and she has maintained Diovan 80 mg daily.  However, last week she had increasing shortness of breath and increasing chest congestion and a chest cold for the past three weeks. She saw a nurse practitioner who prescribed Levaquin for seven days. She was finishing, already, a Z pack from her primary care physician. She  was bringing up thick, yellow mucus. She also had a spiral CT scan done of her chest at [facility]. This showed no evidence of pulmonary emboli and no evidence of consolidation, pleural effusions, or pneumonitis.  The patient did not actually have her antibiotic filled until today and just started her first day of the antibiotic today.  Now notes increasing nausea, diarrhea, weakness, abdominal pain, unable to sit up, extreme weakness, not able to keep any food down whatsoever.  She, thus, comes to the office today as a work-in in a markedly worse state and because of this, requires hospitalization.  CURRENT MEDICATIONS:  1. Lasix 80 mg t.i.d.  2. Celexa 40 mg daily.  3. Prevacid 30 mg b.i.d.  4. Potassium 20 mEq b.i.d.  5. Glucophage XR 500 mg tablet four daily.  6. Micronase has been discontinued.  7. Aspirin has been discontinued.  8. Clarinex 5 mg daily.  9. Diovan 80 mg daily. 10. Zaroxolyn 2.5 mg daily. 11. She also was recently started on Advair one spray once daily at     100/50 strength. 12. She also has metoclopramide p.r.n. and Tussionex p.r.n.  PAST MEDICAL HISTORY:  Severe left ventricular diastolic dysfunction with history of congestive heart failure, history of type 2 diabetes, mild obstructive sleep apnea, diverticulosis, previous cholecystectomy and former smoker with 20-year smoking history.  ALLERGIES:  CODEINE.  FAMILY HISTORY:  Heart disease in the father.  SOCIAL HISTORY:  The patient smoked for 23 years, 10 cigarettes  a day.  Worked in health care in the past.  CURRENT PHYSICAL EXAMINATION:  GENERAL APPEARANCE:  This is an ill-appearing obese white female in mild distress owing to abdominal pain and severe nausea.  VITAL SIGNS:  Blood pressure 132/78, temperature 96.7, pulse 99, saturation 98% on room air.  HEENT:  Oropharynx was dry, mucous membranes dry.  Nares clear.  NECK:  Supple with no lymphadenopathy seen.  CHEST:  This was  entirely clear to auscultation and percussion.  CARDIOVASCULAR:  Regular rate and rhythm with S3, normal S1 and S2.  ABDOMEN:  Protuberant, tender, bowel sounds hypoactive, tender particularly in the lower quadrants.  RECTAL:  Examination was deferred in the office.  EXTREMITIES:  Cool with mottling.  LABORATORY DATA:  All pending at the time of dictation.  IMPRESSION AND RECOMMENDATION:  Nausea, vomiting and abdominal pain.  The patient with history of diverticular disease in the past.  This patient may have active diverticular disease at this time.  Plan will be for the patient to be admitted to the intensive care unit, give IV fluids, obtain abdominal CT scan, check amylase, lipase and routine admission labs.  Begin Cipro and Flagyl IV empirically.  May need to consider gastroenterology consultation.  From the standpoint of the cardiomyopathy and dyspnea, will monitor this while in the hospital and obtain other consultations as necessary. Dictated by:   Charlcie Cradle Delford Field, M.D. [facility] Attending Physician:  Renford Dills D. DD:  08/22/01 TD:  08/26/01 Job: [id number]

## 2010-06-16 NOTE — Op Note (Signed)
NAMECAITLAN, CHAUCA                          ACCOUNT NO.:  [id number]   MEDICAL RECORD NO.:  [id number]                   PATIENT TYPE:  INP   LOCATION:  0001                                 FACILITY:  [facility]   PHYSICIAN:  Thornton Park. Daphine Deutscher, M.D.             DATE OF BIRTH:  1961/01/21   DATE OF PROCEDURE:  03/29/2003  DATE OF DISCHARGE:                                 OPERATIVE REPORT   PREOPERATIVE DIAGNOSES:  1. Morbid obesity, body mass index 48.  2. Diabetes.   POSTOPERATIVE DIAGNOSIS:  1. Morbid obesity, body mass index 48.  2. Diabetes.   PROCEDURE:  Laparoscopic Roux-en-Y gastric bypass.   SURGEON:  Thornton Park. Daphine Deutscher, M.D.   ASSISTANT:  Sandria Bales. Ezzard Standing, M.D.   ANESTHESIA:  General endotracheal.   OPERATIVE TIME:  4 hours 30 minutes.   DESCRIPTION OF PROCEDURE:  Nolyn Eilert is a 50 year old white female who is  followed by Dr. Clyde Canterbury and has a number of medical comorbid conditions,  including a history of congestive heart failure, adult-onset diabetes  mellitus, some diastolic dysfunction of the left ventricle followed by Dr.  Andee Lineman.  She has also been struggling with this morbid obesity and has had a  laparoscopic cholecystectomy in the past with an umbilical hernia  complicating that and also was admitted back in 2000 with left-sided  diverticulitis.  The patient had extensive informed consent regarding  laparoscopic as well as open gastric bypass.   Ms. Morris was taken to room 1 and given general anesthesia.  The abdomen  was prepped widely with Betadine and draped sterilely.  I came in through  her left upper quadrant with the Optiview technique and this was performed  without difficulty.  It was surprising that her left lateral segment was so  large, extending down with this trocar site.  Multiple 10s and then, more  commonly, 12 mm trocars were placed slightly to the right of midline but  well above the umbilicus, as well as a 10 down below for camera.   Initially  we were able to pull the omentum down out of it, which was incarcerated up  in this umbilical hernia, get that out totally.  I did have to use a  Harmonic to get that out.  Then we retracted the colon up superiorly,  identified the ligament of Treitz, and then measured 40 cm and then divided  the small intestine at that point.  A Penrose drain was placed on the  ventral gastro-Roux limb side and then we measured 100 cm upstream with the  Roux.  The parallel limbs of bowel were then laid side to side, again  maintaining the Roux limb to the left of the bilopancreatic limb, and these  were sutured together and then a common channel was created with the Endo-  GIA.  The common defect was closed with two sutures of 4-0 Vicryl with some  Tisseel  on that.  The common defect between the two was closed with a  running 3-0 silk.   Next I placed the [facility] liver retractor without much avail in terms of  exposure of the liver.  I then added from the right upper quadrant another  12 mm retractor and inserted the [employer] fan that has a membrane of  fabric over it, and this is a nice, wide retractor.  This was able to  elevate the liver.  Even with this we had difficulty seeing the  esophagogastric junction.  We did, however, then place some upper ports and  we then identified the esophagogastric junction and the angle of His.  We  attempted to open that a bit.  Next we went down and measured what we  thought was about 5 cm along the lesser curvature and dissected in at that  point, got around behind the stomach, and then began creating retrogastric  space.  Again using the Endo-GIA, we applied several applications again with  all the tubes removed, fired across the stomach, and then going up to create  what looked to be more of a tubular gastric pouch, eventually exposing the  angle of His with the long finger blunt dissector and then transecting it at  the top.  There then  appeared to be completely transected stomach high.  I  did apply some Tisseel along this.  There were some bleeders along the  stomach remnant, which I controlled with the Harmonic scalpel.  An anticolic  Roux limb was then brought up with the candy cane pointed to the right and  was sutured to the back staple row of the stomach with a running 0 Vicryl  suture.  The initial one I had to place was a simple, and then I placed a  running back row using the Laparo-Ties to approximate these.  I then opened  along the stomach and the small intestine and then inserted the Endo-GIA  firing, creating the common channel.  The common defect was then closed  first from the right side, closing that toward the left and then from the  left closing down, although I had to interrupt that and eventually patch  that but got that closed.  The second row of Vicryl was used free-handed  with the Laparo-Tie on the end suturing along to imbricate this over that  staple line with the Ewald tube down and across the anastomosis.  The  patient was then scoped and saw a linear pouch, although it looked like it  was probably more like 7-8 cm long but, again, with the size of her liver  and as high as everything was, we felt that that was an adequate length for  a safe anastomosis.  The endoscope went down to the anastomosis and it was  submerged, and no bubbles were noted anteriorly.  Some gas did get through  where I had attempted to clamp off the bowel distally.  Tisseel was then  applied to the anterior anastomosis and along the Roux-Y end piece.  The  anastomoses looked good with good, healthy-looking tissue.  I placed a 19  Blake drain in the upper abdomen and brought it out through one of the ports  on the left side laterally and secured it with a 3-0 nylon.  The wounds were  then closed with 4-0 Vicryl.  The one from the left lowermost one did have  some bleeding, which required the electrocautery.  All were  closed with 4-0 Vicryl with Benzoin and Steri-Strips.  The patient seemed to tolerate the  procedure well and was taken to the recovery room in satisfactory condition.  She will go to the intensive care unit postop.                                               Thornton Park Daphine Deutscher, M.D.    MBM/MEDQ  D:  03/29/2003  T:  03/29/2003  Job:  [id number]   cc:   Otilio Connors. Gerri Spore, M.D.  [address]  [place]  [place] [address]  Fax: [phone]

## 2010-06-16 NOTE — Discharge Summary (Signed)
[name], [name]                          ACCOUNT NO.:  [id number]   MEDICAL RECORD NO.:  [id number]                   PATIENT TYPE:  INP   LOCATION:  0460                                 FACILITY:  [facility]   PHYSICIAN:  Thornton Park. Daphine Deutscher, M.D.             DATE OF BIRTH:  [birth year]   DATE OF ADMISSION:  03/29/2003  DATE OF DISCHARGE:  04/02/2003                                 DISCHARGE SUMMARY   ADMITTING DIAGNOSES:  1. Morbid obesity.  2. Sleep apnea.  3. Insulin resistant diabetes mellitus.  4. Hypertension.  5. Chronic obstructive pulmonary disease.   COURSE IN THE HOSPITAL:  Stephanie Peck is a 50 year old, morbidly obese  patient, who underwent a laparoscopic Roux-en-Y gastric bypass on March 29, 2003.  She tolerated this procedure well but postoperatively got  nauseated.  Because she did have an umbilical hernia which because of her  size, we had tried to put off and fix after she had lost weight.  But,  indeed, she became obstructed from this, and I took her back to the OR on  March 1 and did an open repair of incarcerated umbilical hernia.  Following  that, she did well.  Her diabetes was controlled, and she was prepared for  discharge on April 02, 2003.  She was given Roxicet Elixir to take for pain.  Her condition was much improved.  She was instructed to return to the office  in approximately one week for drainage removal.   FINAL DIAGNOSIS:  Morbid obesity, status post laparoscopic Roux-en-Y gastric  bypass followed by repair of incarcerated umbilical hernia.                                               Thornton Park Daphine Deutscher, M.D.    MBM/MEDQ  D:  05/03/2003  T:  05/04/2003  Job:  [id number]

## 2010-06-16 NOTE — Discharge Summary (Signed)
. [facility]  Patient:    Stephanie Peck, SWARTZENDRUBER                       MRN: [id number] Adm. Date:  [id number] Disc. Date: [id number] Attending:  Meade Maw A Dictator:   Anselm Lis, N.P. CC:         Meade Maw, M.D.             Carola J. Gerri Spore, M.D.                           Discharge Summary  DISCHARGE DIAGNOSES: 1. Chest pain, noncardiac in etiology, ruled out for myocardial infarction by    negative serial cardiac enzymes.  Nonischemic electrocardiogram. 2. Cardiac catheterization on July 12, 1999.  Left ventriculogram with mild    diffuse hypokinesis.  Ejection fraction 50%. 3. Coronary angiography with left main bifurcates with no significant disease.    Left anterior descending large diagonal I, small diagonal II, small    diagonal III which ends as apical recurrent, no significance disease.    Circumflex with moderate sized vessel, small obtuse marginal, no    significance disease. 4. 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 5. Congestive heart failure.  Admission chest x-ray revealing congestive heart    failure, admission O2 saturations of 88%.  Improvement of room air O2    saturations to 97-98% after diuresis of 3 liters.  Congestive heart failure    in setting of hypertension with systolic blood pressure ranging from    140s-200s/75-105.  Will be discharged home on hydrochlorothiazide for    improvement of blood pressure control. 6. Hypertension, discharged on hydrochlorothiazide.  Blood pressures ranging    approximately 130/70.  The patient was instructed and given guidelines for    weight reduction and no added salt with 4 g sodium per day diet.  COMPLICATIONS:  Lost placement of sheath prior to injection of right coronary artery.  We elected not proceed with replacement of sheath secondary to risk/benefit ratio.  DISPOSITION:  The patient was discharged home in stable condition.  DISCHARGE  MEDICATIONS: 1. Hydrochlorothiazide 25 mg once daily. 2. Prevacid 30 mg one p.o. q.d.  ACTIVITY:  No heavy lifting or pushing greater than 10-15 pounds.  No driving.  DIET:  Low fat, low cholesterol with no added salt recommended.  WOUND CARE:  May shower.  SPECIAL INSTRUCTIONS:  The patient should call if she develops large amounts of swelling or bruising in groin area.  FOLLOWUP:  Follow up with Dr. Clyde Canterbury at [facility] a next available appointment.  LABORATORY DATA AND X-RAY FINDINGS:  Cardiac enzymes:  Serial cardiac enzymes and troponin I negative x 3.  Admission sodium 138, potassium 3.6, supplemented for diuresis, serum potassium 4.2 on June 13, chloride 102, CO2 28, glucose 109, BUN 7, creatinine 0.7.  LFTs within normal limits.  Calcium 8.9.  PT 13.5 with INR of 1.1 and PTT of 28.  D-dimer elevated at 1.41.  WBC on admission was 12.7 with 9.2 on June 13, hemoglobin 14.7, hematocrit 42.7, platelets initially 499, 424 on June 13.  Chest x-ray showed mild CHF. Admission EKG showed normal sinus rhythm without ischemic changes.  QTC of 490 msec. DD:  07/12/99 TD:  07/16/99 Job: [id number]

## 2010-06-16 NOTE — Discharge Summary (Signed)
[place]  Patient:    Stephanie Peck, Stephanie Peck Visit Number: [id number] MRN: [id number]          Service Type: MED Location: [phone] Attending Physician:  Learta Codding Dictated by:   Lavella Hammock, P.A.-C. Admit Date:  12/18/2000 Discharge Date: 12/21/2000   CC:         Stephanie Peck, M.D.   Discharge Summary  DATE OF BIRTH:  Dec 25, 1960  PROCEDURES:  None.  HOSPITAL COURSE:  [name] is a 50 year old female with a history of severe left ventricular diastolic dysfunction and normal coronaries in June 2001, who was evaluated by Dr. Andee Lineman for worsening dyspnea, orthopnea, weight gain, and admitted to the hospital.  She was diuresed aggressively, and she had an admission weight of 339 but lost more than 10 pounds by the time of her discharge.  Her discharge weight was 338.6.  She required potassium supplementation during this for hypokalemia.  She was put on Lasix 80 mg IV t.i.d., and this did a good job of diuresing her.  Her BUN and creatinine showed no significant changes.  She tolerated the diuresis well.  She had a mildly elevated white count but was afebrile.  Per the patient, a mildly elevated white count is sometimes the first symptom or sign of diverticulitis, and she is to follow up with her primary care physician on that.  By 12/21/00, the patient was breathing much better.  She still complained of some cough and said that she was not quite at her baseline but felt she had improved significantly.  She was evaluated by the physician, and it was felt she could continue diuresis as an outpatient.  She was considered stable for discharge on 12/21/00.  LABORATORY VALUES:  Hemoglobin 14.6, hematocrit 43.3, WBC 11.6, platelets 528. Sodium 139, potassium 4.0, chloride 106, CO2 25, BUN 13, creatinine 0.7, glucose 96.  BMP 8.1.  DISCHARGE CONDITION:  Improved.  CONSULTS:  None.  COMPLICATIONS:  None.  DISCHARGE  DIAGNOSES: 1. Chest pain, cough, shortness of breath and weight gain, congestive heart    failure.  The patient is being discharged on an increased dose of Demadex. 2. Hypokalemia:  The patient is on her home dose of potassium with a potassium    of 4.1 at discharge.  She is to check a basic metabolic panel next week    when she sees her family physician. 3. Negative D-dimer, no pulmonary embolism. 4. Non-insulin dependent diabetes mellitus:  The patient is to continue her    home medications and follow up as an outpatient. 5. Mild leukocytosis:  The patient is to follow up with her family physician    for the possibility of diverticulitis, no signs of acute infection in    hospital. 6. Obesity/obstructive sleep apnea.  DISCHARGE INSTRUCTIONS: 1. Her activity level is to be as tolerated. 2. She is to call and get a BMET done next week when she sees Dr. Gerri Peck. 3. She is to follow up with Dr. Andee Lineman and call for an appointment.  DISCHARGE MEDICATIONS:  1. Demadex 100 mg q.d.  2. Lisinopril is on hold for now.  3. Glucotrol XL 20 mg q.d.  4. Aspirin 81 mg q.d.  5. Celexa 20 mg q.d.  6. K-Dur 20 mEq b.i.d.  7. Advair q.d.  8. Atrovent p.r.n.  9. Micronor q.d. 10. Prevacid 30 mg q.d. Dictated by:   Lavella Hammock, P.A.-C. Attending Physician:  Learta Codding DD:  12/21/00 TD:  12/21/00 Job: [id number]

## 2010-06-16 NOTE — Cardiovascular Report (Signed)
Stephanie Peck, Stephanie Peck                ACCOUNT NO.:  [id number]   MEDICAL RECORD NO.:  [id number]          PATIENT TYPE:  OIB   LOCATION:  2858                         FACILITY:  [facility]   PHYSICIAN:  Charlies Constable, M.D. [facility] DATE OF BIRTH:  [birth year]   DATE OF PROCEDURE:  01/04/2005  DATE OF DISCHARGE:                              CARDIAC CATHETERIZATION   CLINICAL HISTORY:  Ms. Rozas is 50 years old and had a catheterization in  2001 that shows no evidence of coronary disease.  She was recently seen in  consultation by Dr. Andee Lineman with increasing symptoms of chest pain.  He did a  Cardiolite scan which was described as being low-risk but because of  persistent symptoms, she was brought in today for catheterization.   She has had previous gastric bypass surgery for obesity.   PROCEDURE:  The procedure was performed via the right femoral artery using  an arterial sheath and 6 French preformed coronary catheters.  A front wall  arterial puncture was performed and Omnipaque contrast was used.  A distal  aortogram was performed to rule out renovascular causes for hypertension.  The patient tolerated the procedure well and left the laboratory in  satisfactory condition.   RESULTS:  1.  Left main coronary artery:  The left main coronary artery was free of      significant disease.  2.  Left anterior descending artery:  The left anterior descending gave rise      to a large septal perforator and two diagonal branches.  These and the      LAD proper were free of significant disease.  3.  Circumflex artery:  The circumflex artery gave rise to a large ramus      branch and posterolateral branch.  There was 30% narrowing in the mid-      to distal circumflex artery.  4.  Right coronary artery:  The right coronary artery was a moderate-sized      vessel that gave rise to right ventricular branches, a posterior      descending branch and a posterolateral branch.  These vessels were free      of  significant disease.  5.  Left ventriculogram:  The left ventriculogram performed in the RAO      projection showed good wall motion but no evidence of hypokinesis.  The      estimated ejection fraction was 60%.  6.  Distal aortogram:  A distal aortogram was performed, which showed patent      renal arteries.  We did not visualize the iliac vessels.  7.  The aortic pressure was 165/95 with a mean of 122, and left ventricular      pressure was 165/24.   CONCLUSION:  Minimal nonobstructive coronary artery disease with 30%  narrowing in the mid- to distal circumflex artery and no significant  obstruction in the LAD and right coronary artery, and normal left  ventricular function.   RECOMMENDATIONS:  Reassurance.  We were unable to close the right femoral  artery due to a stick below the bifurcation.  Will plan to let  the patient  go home later tonight and follow up with Dr. Andee Lineman next week to decide  about further evaluation of her chest pain.           ______________________________  Charlies Constable, M.D. [facility]     BB/MEDQ  D:  01/04/2005  T:  01/05/2005  Job:  [id number]   cc:   Learta Codding, M.D. [facility]  [address]  [address]  [place]  [place] [address]   Cardiopulmonary Lab   Otilio Connors. Gerri Spore, M.D.  Fax: [phone]

## 2010-06-16 NOTE — Cardiovascular Report (Signed)
[place]  Patient:    Stephanie Peck, Stephanie Peck                       MRN: [id number] Proc. Date: 07/12/99 Adm. Date:  [id number] Disc. Date: [id number] Attending:  Meade Maw A CC:         Carola J. Gerri Spore, M.D.                        Cardiac Catheterization  INDICATIONS FOR PROCEDURE:  Congestive heart failure.  REFERRING PHYSICIAN:  Carola J. Gerri Spore, M.D.  HISTORY:  The patient is a pleasant 50 year old female with positive family history of coronary artery disease and tobacco abuse.  She presented with complaints of severe shortness of breath at approximately 4 a.m.  She had mild anterior chest tightness thought to be related primarily to her shortness of breath.  Denied palpitations.  Chest x-ray reveals congestive heart failure. EMS was summoned.  The patient was given Lasix 90 mg, aspirin, and sublingual nitroglycerin.  She subsequently diuresed approximately 3 L and felt much improved.  Initial O2 saturation was 88%; this improved to 96% on room air.  Risks, benefits, and options were discussed with the patient and it was elected to proceed with left heart catheterization for evaluation and it was felt that Cardiolite imaging would not be successful at demonstrating the patients EF and possible ischemia secondary to the patients weight.  PROCEDURE:  After obtaining written informed consent, the patient was brought to the cardiac catheterization lab in a postabsorptive state.  Preop sedation was achieved using IV Versed and IV fentanyl.  The right femoral head was identified using radiographic technique.  Local anesthesia was achieved using 1% Xylocaine.  A #6 French hemostasis sheath was placed into the right femoral artery without difficulty using the modified Seldinger technique.  Selective coronary angiography was performed of the left coronary artery system and single-plane ventriculogram was performed using a #6 French pigtail  curved catheter.  Following engagement of the left coronary artery, the hemostasis sheath was removed following attempts to insert the JR4 while resistance was felt.  The hemostasis sheath was reviewed and was inadvertently noted to be displaced from the right femoral artery.  Based on the normal ejection fraction and no coronary artery disease in the left system, it was elected to abort the procedure based on the risk/benefit ratio.  The hemostasis sheath was removed and was inspected.  There were no holes identified in the hemostasis sheath and it was felt that the weight of the abdomen had displaced the sheath from the artery.  FINDINGS:  Aortic pressure was 194/106, LV pressure was 194/39, with an EDP of 24.  The single-plane ventriculogram revealed mild diffuse hypokinesis, ejection fraction of approximately 50%.  Left main coronary artery bifurcated into the left anterior descending and circumflex vessel.  There was no significant disease in the left main coronary artery.  The left anterior descending gave rise to large D1, small D2, small D3, ended as an apical recurrent branch. There was no significant disease in the left anterior descending.  Circumflex vessel was a moderate size vessel.  It gave rise to a small OM-1.  There was no significant disease in the circumflex vessel.  IMPRESSION:  Mild diffuse hypokinesis with an ejection fraction of 50%.  It is felt that her congestive heart failure is based on diastolic dysfunction, which is a reflection of her obesity and hypertension.  There was no significant coronary artery disease.  The patient will be transferred to her room.  She will be observed overnight due to inadvertent sheath displacement. DD:  07/12/99 TD:  07/15/99 Job: [id number]

## 2010-06-16 NOTE — Op Note (Signed)
NAMELEXI, CONATY                          ACCOUNT NO.:  [id number]   MEDICAL RECORD NO.:  [id number]                   PATIENT TYPE:  INP   LOCATION:  0001                                 FACILITY:  [facility]   PHYSICIAN:  Sandria Bales. Ezzard Standing, M.D.               DATE OF BIRTH:  10-25-1960   DATE OF PROCEDURE:  03/29/2003  DATE OF DISCHARGE:                                 OPERATIVE REPORT   PREOPERATIVE DIAGNOSIS:  Morbidly obese, status post Roux-en-Y gastric  bypass.   POSTOPERATIVE DIAGNOSIS:  Morbidly obese, status post Roux-en-Y gastric  bypass.   PROCEDURE:  Esophagogastrojejunoscopy.   SURGEON:  Sandria Bales. Ezzard Standing, M.D.   ANESTHESIA:  General.   INDICATION FOR PROCEDURE:  Ms. Fairbairn is a 50 year old white female who is  undergoing a laparoscopic Roux-en-Y gastric bypass by Thornton Park. Daphine Deutscher,  M.D.  She has a BMI of 50, and I am now performing an upper endoscopy to  document the patency of the anastomosis, check for leaks, and check for  bleeding.   The patient is in a supine position under general anesthesia.  Dr. Daphine Deutscher is  manning the upper endoscope while I do the procedure.   A flexible Olympus endoscope is passed without difficulty down her esophagus  into her stomach pouch.  I identified the gastrojejunostomy with a patent  anastomosis and took a picture of this.  Her anastomosis is about 50 cm from  her incisors.  Her stomach looked good.  I could see the staple line.  There  was no evidence of bleeding.  While I was endoscoping the patient, Dr.  Daphine Deutscher clamped off the jejunum.  I then insufflated the stomach pouch.  He  flooded the upper abdomen with saline, and we saw no evidence of any air  leak.   I then withdrew the scope back and found the GE junction at about 42-43 cm,  so she has a pouch of about 7-8 cm in length.  There was no other  abnormality of the stomach.  The scope was withdrawn into the esophagus,  which was normal.   She tolerated the procedure  well.  Dr. Daphine Deutscher will dictate the main portion  of the Roux-en-Y gastric bypass.                                               Sandria Bales. Ezzard Standing, M.D.   DHN/MEDQ  D:  03/29/2003  T:  03/29/2003  Job:  [id number]   cc:   Thornton Park Daphine Deutscher, M.D.  [address], [address]  [place]  [place] [address]  Fax: [phone]

## 2010-10-18 LAB — BASIC METABOLIC PANEL
BUN: 16
BUN: 23
CO2: 22
Calcium: 8 — ABNORMAL LOW
Calcium: 8.2 — ABNORMAL LOW
GFR calc non Af Amer: 47 — ABNORMAL LOW
GFR calc non Af Amer: 48 — ABNORMAL LOW
Glucose, Bld: 163 — ABNORMAL HIGH
Glucose, Bld: 223 — ABNORMAL HIGH
Potassium: 3.6
Sodium: 137

## 2010-10-18 LAB — URINALYSIS, ROUTINE W REFLEX MICROSCOPIC
Bilirubin Urine: NEGATIVE
Ketones, ur: 15 — AB
Nitrite: NEGATIVE
Specific Gravity, Urine: 1.012
Specific Gravity, Urine: 1.023
Urobilinogen, UA: 0.2
pH: 5
pH: 6.5

## 2010-10-18 LAB — COMPREHENSIVE METABOLIC PANEL
ALT: 22
ALT: 30
AST: 15
AST: 27
Albumin: 2.1 — ABNORMAL LOW
Albumin: 3.1 — ABNORMAL LOW
Albumin: 3.3 — ABNORMAL LOW
Alkaline Phosphatase: 55
Alkaline Phosphatase: 64
BUN: 28 — ABNORMAL HIGH
BUN: 33 — ABNORMAL HIGH
CO2: 23
Calcium: 7.8 — ABNORMAL LOW
Calcium: 8.7
Calcium: 9.2
Chloride: 110
Creatinine, Ser: 1.21 — ABNORMAL HIGH
Creatinine, Ser: 2 — ABNORMAL HIGH
GFR calc Af Amer: 51 — ABNORMAL LOW
GFR calc Af Amer: 57 — ABNORMAL LOW
GFR calc non Af Amer: 47 — ABNORMAL LOW
Glucose, Bld: 286 — ABNORMAL HIGH
Glucose, Bld: 288 — ABNORMAL HIGH
Potassium: 3.3 — ABNORMAL LOW
Potassium: 4
Sodium: 136
Sodium: 139
Total Bilirubin: 0.7
Total Protein: 6.2
Total Protein: 6.6
Total Protein: 7.6
Total Protein: 7.7

## 2010-10-18 LAB — CBC
HCT: 33.6 — ABNORMAL LOW
HCT: 35.3 — ABNORMAL LOW
HCT: 39.1
HCT: 44.6
Hemoglobin: 11.4 — ABNORMAL LOW
Hemoglobin: 13.6
Hemoglobin: 15.2 — ABNORMAL HIGH
MCHC: 34.1
MCHC: 34.3
MCHC: 34.7
MCHC: 35.1
MCV: 93.3
MCV: 93.8
MCV: 95
Platelets: 410 — ABNORMAL HIGH
Platelets: 434 — ABNORMAL HIGH
Platelets: 438 — ABNORMAL HIGH
Platelets: 456 — ABNORMAL HIGH
Platelets: 528 — ABNORMAL HIGH
RDW: 13
RDW: 13.2
RDW: 13.3
RDW: 13.3
RDW: 13.4
WBC: 16.7 — ABNORMAL HIGH
WBC: 28 — ABNORMAL HIGH

## 2010-10-18 LAB — DIFFERENTIAL
Basophils Absolute: 0
Basophils Relative: 0
Basophils Relative: 0
Basophils Relative: 0
Eosinophils Absolute: 0
Eosinophils Relative: 0
Eosinophils Relative: 0
Lymphocytes Relative: 4 — ABNORMAL LOW
Lymphocytes Relative: 8 — ABNORMAL LOW
Lymphs Abs: 1.8
Monocytes Absolute: 1.8 — ABNORMAL HIGH
Monocytes Relative: 4
Monocytes Relative: 4
Neutro Abs: 27.2 — ABNORMAL HIGH
Neutrophils Relative %: 77
Neutrophils Relative %: 92 — ABNORMAL HIGH

## 2010-10-18 LAB — TRICYCLICS SCREEN, URINE: TCA Scrn: NOT DETECTED

## 2010-10-18 LAB — CULTURE, RESPIRATORY W GRAM STAIN

## 2010-10-18 LAB — LACTATE DEHYDROGENASE: LDH: 139

## 2010-10-18 LAB — CARDIAC PANEL(CRET KIN+CKTOT+MB+TROPI)
CK, MB: 1.2
CK, MB: 1.5
CK, MB: 2.2
Relative Index: INVALID
Total CK: 67
Troponin I: 0.02
Troponin I: 0.04

## 2010-10-18 LAB — POCT I-STAT 3, ART BLOOD GAS (G3+)
O2 Saturation: 96
pCO2 arterial: 29.1 — ABNORMAL LOW
pH, Arterial: 7.484 — ABNORMAL HIGH
pO2, Arterial: 79 — ABNORMAL LOW

## 2010-10-18 LAB — PROTIME-INR: INR: 1.3

## 2010-10-18 LAB — CORTISOL: Cortisol, Plasma: 18.1

## 2010-10-18 LAB — I-STAT 8, (EC8 V) (CONVERTED LAB)
Chloride: 105
HCT: 49 — ABNORMAL HIGH
Hemoglobin: 16.7 — ABNORMAL HIGH
Operator id: 198171
Potassium: 5.4 — ABNORMAL HIGH
Sodium: 132 — ABNORMAL LOW
pCO2, Ven: 27.7 — ABNORMAL LOW

## 2010-10-18 LAB — SALICYLATE LEVEL: Salicylate Lvl: <4

## 2010-10-18 LAB — CARBOXYHEMOGLOBIN
Methemoglobin: 1.7 — ABNORMAL HIGH
Total hemoglobin: 13.9

## 2010-10-18 LAB — CULTURE, BLOOD (ROUTINE X 2)

## 2010-10-18 LAB — CROSSMATCH: ABO/RH(D): O POS

## 2010-10-18 LAB — CK TOTAL AND CKMB (NOT AT ARMC): Relative Index: 3.4 — ABNORMAL HIGH

## 2010-10-18 LAB — URINE CULTURE

## 2010-10-18 LAB — URINE MICROSCOPIC-ADD ON

## 2010-10-18 LAB — ETHANOL: Alcohol, Ethyl (B): <5

## 2010-10-18 LAB — RAPID URINE DRUG SCREEN, HOSP PERFORMED: Barbiturates: NOT DETECTED

## 2010-10-18 LAB — FIBRINOGEN: Fibrinogen: >800 — ABNORMAL HIGH

## 2010-10-18 LAB — POCT I-STAT CREATININE
Creatinine, Ser: 2.1 — ABNORMAL HIGH
Operator id: 198171

## 2010-10-18 LAB — LEGIONELLA ANTIGEN, URINE

## 2010-10-18 LAB — AMYLASE: Amylase: 17 — ABNORMAL LOW

## 2010-10-18 LAB — LACTIC ACID, PLASMA: Lactic Acid, Venous: 1.6

## 2010-11-21 ENCOUNTER — Other Ambulatory Visit (HOSPITAL_COMMUNITY)
Admission: RE | Admit: 2010-11-21 | Discharge: 2010-11-21 | Disposition: A | Discharge status: Home / Self Care | Payer: Medicare Other | Source: Ambulatory Visit | Attending: Obstetrics and Gynecology | Admitting: Obstetrics and Gynecology

## 2010-11-21 ENCOUNTER — Other Ambulatory Visit: Payer: Self-pay | Admitting: Nurse Practitioner

## 2010-11-21 ENCOUNTER — Other Ambulatory Visit: Payer: Self-pay | Admitting: Obstetrics and Gynecology

## 2010-11-21 DIAGNOSIS — Z1159 Encounter for screening for other viral diseases: Secondary | ICD-10-CM | POA: Insufficient documentation

## 2010-11-21 DIAGNOSIS — Z1231 Encounter for screening mammogram for malignant neoplasm of breast: Secondary | ICD-10-CM

## 2010-11-21 DIAGNOSIS — Z01419 Encounter for gynecological examination (general) (routine) without abnormal findings: Secondary | ICD-10-CM | POA: Insufficient documentation

## 2010-12-08 ENCOUNTER — Ambulatory Visit: Payer: Self-pay

## 2010-12-12 ENCOUNTER — Ambulatory Visit: Payer: Self-pay

## 2011-06-12 DIAGNOSIS — E1129 Type 2 diabetes mellitus with other diabetic kidney complication: Secondary | ICD-10-CM | POA: Diagnosis not present

## 2011-06-12 DIAGNOSIS — M79609 Pain in unspecified limb: Secondary | ICD-10-CM | POA: Diagnosis not present

## 2011-06-12 DIAGNOSIS — Z79899 Other long term (current) drug therapy: Secondary | ICD-10-CM | POA: Diagnosis not present

## 2011-06-15 ENCOUNTER — Encounter: Payer: Self-pay | Admitting: Physical Medicine and Rehabilitation

## 2011-06-20 DIAGNOSIS — F17200 Nicotine dependence, unspecified, uncomplicated: Secondary | ICD-10-CM | POA: Diagnosis not present

## 2011-06-20 DIAGNOSIS — I519 Heart disease, unspecified: Secondary | ICD-10-CM | POA: Diagnosis not present

## 2011-06-20 DIAGNOSIS — E669 Obesity, unspecified: Secondary | ICD-10-CM | POA: Diagnosis not present

## 2011-06-20 DIAGNOSIS — I10 Essential (primary) hypertension: Secondary | ICD-10-CM | POA: Diagnosis not present

## 2011-06-27 ENCOUNTER — Encounter
Payer: Medicare Other | Attending: Physical Medicine and Rehabilitation | Admitting: Physical Medicine and Rehabilitation

## 2011-06-27 ENCOUNTER — Encounter: Payer: Self-pay | Admitting: Physical Medicine and Rehabilitation

## 2011-06-27 VITALS — BP 134/58 | HR 88 | Resp 16 | Ht 66.0 in | Wt 280.0 lb

## 2011-06-27 DIAGNOSIS — M549 Dorsalgia, unspecified: Secondary | ICD-10-CM | POA: Diagnosis not present

## 2011-06-27 DIAGNOSIS — M79604 Pain in right leg: Secondary | ICD-10-CM

## 2011-06-27 DIAGNOSIS — M545 Low back pain, unspecified: Secondary | ICD-10-CM | POA: Diagnosis not present

## 2011-06-27 DIAGNOSIS — M79605 Pain in left leg: Secondary | ICD-10-CM | POA: Insufficient documentation

## 2011-06-27 DIAGNOSIS — M79609 Pain in unspecified limb: Secondary | ICD-10-CM | POA: Insufficient documentation

## 2011-06-27 DIAGNOSIS — M25561 Pain in right knee: Secondary | ICD-10-CM

## 2011-06-27 DIAGNOSIS — M25562 Pain in left knee: Secondary | ICD-10-CM

## 2011-06-27 DIAGNOSIS — M25569 Pain in unspecified knee: Secondary | ICD-10-CM | POA: Diagnosis not present

## 2011-06-27 DIAGNOSIS — M542 Cervicalgia: Secondary | ICD-10-CM | POA: Insufficient documentation

## 2011-06-27 NOTE — Patient Instructions (Signed)
I have ordered x-rays of your neck back and knees.  We have discussed that I will consider and MRI as well after I reviewed her x-rays.  Follow back up in my clinic in 2-3 weeks

## 2011-06-27 NOTE — Progress Notes (Signed)
Addended by: Judd Gaudier on: 06/27/2011 11:46 AM   Modules accepted: Orders

## 2011-06-27 NOTE — Progress Notes (Addendum)
Subjective:    Patient ID: Stephanie Peck, female    DOB: 1960-09-29, 51 y.o.   MRN: [id number] The patient is a 51 year old Caucasian lady  with a history of diabetes mellitus, hypertension, obstructive sleep  apnea, diastolic dysfunction with ejection fraction of 60%, minimal  coronary artery disease with last catheterization done in December 2006,  showing 30% stenosis of the circumflex artery, morbid obesity with BMI  of 48, and tobacco abuse.   Patient presents with a very long history of low back pain, she states however it got a lot worse in about 2001.  Her chief complaint is low back pain which is located at the lumbosacral junction and radiates bilaterally to the lateral hips. The pain is essentially bilateral. She reports approximately 3 month history of bilateral leg pain involving the entire leg to the toes which occurs when she is up walking  40 less than 5 minutes. Pain persists even after she sits down. No persistent numbness or tingling. Denies problems with bowel or bladder control. There is no history of trauma, motor vehicle accident, falls in the last 6 months.  Her second problem is bilateral knee pain left greater than right. She is seeing Dr. Rennis Chris who has told her she has osteo arthritis.she has undergone bilateral knee injections per Dr. Rennis Chris. These have helped the right knee but she still having problems with the left knee. She's been using a cane for ambulation for more than 10 years initially for the back down helps with her knees as well.  Her third problem is neck pain which began approximately 3 years ago. There is no history of injury. Pain is localized to the posterior neck radiates anteriorly to an area between the scapula. There is no referral of pain down either arm. Particularly painful for her approximately 3 days a week   Anselmo Pickler is present in the room for the history and physical with the permission of the patient  PAST MEDICAL HISTORY:  1.  Diabetes mellitus.  2. Hypertension.  3. Obstructive sleep apnea.  4. Diastolic dysfunction with EF of 60%.  5. Minimal CAD with 30% stenosis of the circumflex artery per cardiac  catheterization on December 2006.  6. Morbid obesity.  7. Tobacco abuse.  8. GERD.  9. Fatty liver.  10.Status post cholecystectomy and status post laparoscopic Roux-en-Y  gastric bypass.  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  12.Complex left ovarian cyst diagnosed in 2002, by transvaginal  ultrasound.      HPI Pain Inventory Average Pain 7 Pain Right Now 8 My pain is sharp, burning, stabbing and aching  In the last 24 hours, has pain interfered with the following? General activity 4 Relation with others 4 Enjoyment of life 9 What TIME of day is your pain at its worst? night Sleep (in general) Poor  Pain is worse with: walking and standing Pain improves with: rest, medication and TENS Relief from Meds: 3  Mobility walk with assistance use a cane how many minutes can you walk? 3-5 ability to climb steps?  yes do you drive?  yes transfers alone  Function disabled: date disabled 2002 I need assistance with the following:  meal prep, household duties and shopping  Neuro/Psych weakness trouble walking dizziness confusion depression  Prior Studies x-rays CT/MRI  Physicians involved in your care Primary care Dr. Clyde Canterbury Orthopedist Dr. Rennis Chris   Family History  Problem Relation Age of Onset  . Cancer Mother   . Depression Mother   . Heart disease  Father   . Diabetes Father   . Hypertension Father   . Cancer Maternal Grandmother    History   Social History  . Marital Status: Married    Spouse Name: N/A    Number of Children: N/A  . Years of Education: N/A   Social History Main Topics  . Smoking status: Current Everyday Smoker  . Smokeless tobacco: None  . Alcohol Use: None  . Drug Use: None  . Sexually Active: None   Other Topics Concern  . None   Social  History Narrative  . None   Past Surgical History  Procedure Date  . Cholecystectomy   . Gastric bypass   . Tonsillectomy    Past Medical History  Diagnosis Date  . Diabetes mellitus   . GERD (gastroesophageal reflux disease)   . Anxiety   . Depression    BP 134/58  Pulse 88  Resp 16  Ht 5\' 6"  (1.676 m)  Wt 280 lb (127.007 kg)  BMI 45.19 kg/m2  SpO2 99%      Review of Systems  Constitutional: Negative.   HENT: Negative.   Eyes: Negative.   Respiratory: Positive for apnea and shortness of breath.   Cardiovascular: Positive for leg swelling.  Gastrointestinal: Positive for nausea, abdominal pain and diarrhea.  Genitourinary: Negative.   Musculoskeletal: Positive for back pain.  Neurological: Negative.   Hematological: Negative.   Psychiatric/Behavioral: Positive for confusion.       Objective:   Physical Exam The patient is an obese white female in no apparent distress.  She is oriented x3 her speech is clear her affect is bright she's alert cooperative and pleasant  She follows commands without difficulty answers questions appropriately  Cranial nerves II through XII are grossly intact  Coordination is grossly intact  Motor strength 5/5 upper and lower extremities without obvious focal deficit  Sensory exam decreased sensation below the knees, decreased pinprick L5 distribution  Decreased vibratory sensation in feet compared to hands  Upper extremity reflexes 3+  2-3+ at the knees, absent at ankles bilaterally  Straight leg raise negative  Rises from chair slowly, gait is slow and careful, antalgic, typically uses cane  Tandem gait not tested due to safety concerns (patient reports knee pain), Romberg test performed adequately  Decreased cervical range of motion noted  Diminished lumbar range of motion, after flexing forward from the lumbar spine has difficulty returning to upright secondary to low back pain  Diffuse tenderness in the  lumbosacral area specially paraspinal musculature  No tenderness over trochanter's bilaterally  Knee exam without significant deformity or effusion, medial joint line and lateral joint line nontender, crepitus noted with flexion extension, no obvious instability appreciated AP or ML.    12/24/08 Clinical Data: Severe mid back pain.  THORACOLUMBAR SPINE - 2 VIEW  Comparison: Chest x-ray of 01/16/2008  Findings: There are degenerative disc disease changes in the mid  thoracic spine with anterior flowing osteophytes. Normal  alignment. No fracture.  Mild degenerative changes in the lower lumbar spine with anterior  osteophytes. Normal alignment. No fracture. SI joints are  symmetric.  IMPRESSION:  Mild thoracic and lumbar spondylosis.  No acute findings.  Provider: Candice Demaegd      Assessment & Plan:   1. Low back pain  2. Bilateral knee pain  3. Cervicalgia/Mid back pain  4. Gait disorder  5. Bilateral leg pain which radiates from the back to the feet especially with ambulation: considering neurogenic versus vascular causes.  Previous radiographs show flowing anterior osteophytes in the thoracic region. These were done a couple years ago, I would like to obtain radiographs of cervical and lumbar spine. I'm wondering if this is early DISH.  I think her leg pain may be multifactorial in nature. On exam she is some signs of peripheral neuropathy, her history is suggestive of claudication but she also has an overlay of some knee involvement as well.  I'm considering MRI to sort out the leg pain issue, but may consider other testing. For example EMG and NCV, possibly vascular workup.  I think she would benefit from physical therapy with an emphasis on a balance program and lower extremity strengthening. This would help prevent falls.  I discussed the above with the patient and her sister and they are in agreement with this plan so far.  I'll see her back in 2-3  weeks.

## 2011-07-02 NOTE — Progress Notes (Signed)
Stephanie Peck there are no results attached to this UDS screen note

## 2011-07-04 ENCOUNTER — Ambulatory Visit: Payer: Medicare Other | Admitting: Physical Medicine and Rehabilitation

## 2011-07-09 ENCOUNTER — Ambulatory Visit (HOSPITAL_COMMUNITY)
Admission: RE | Admit: 2011-07-09 | Discharge: 2011-07-09 | Disposition: A | Discharge status: Home / Self Care | Payer: Medicare Other | Source: Ambulatory Visit | Attending: Physical Medicine and Rehabilitation | Admitting: Physical Medicine and Rehabilitation

## 2011-07-09 DIAGNOSIS — M25562 Pain in left knee: Secondary | ICD-10-CM

## 2011-07-09 DIAGNOSIS — M47812 Spondylosis without myelopathy or radiculopathy, cervical region: Secondary | ICD-10-CM | POA: Diagnosis not present

## 2011-07-09 DIAGNOSIS — M4120 Other idiopathic scoliosis, site unspecified: Secondary | ICD-10-CM | POA: Diagnosis not present

## 2011-07-09 DIAGNOSIS — M542 Cervicalgia: Secondary | ICD-10-CM | POA: Diagnosis not present

## 2011-07-09 DIAGNOSIS — M5137 Other intervertebral disc degeneration, lumbosacral region: Secondary | ICD-10-CM | POA: Insufficient documentation

## 2011-07-09 DIAGNOSIS — M545 Low back pain, unspecified: Secondary | ICD-10-CM | POA: Insufficient documentation

## 2011-07-09 DIAGNOSIS — M25569 Pain in unspecified knee: Secondary | ICD-10-CM | POA: Diagnosis not present

## 2011-07-09 DIAGNOSIS — M79609 Pain in unspecified limb: Secondary | ICD-10-CM | POA: Insufficient documentation

## 2011-07-09 DIAGNOSIS — M51379 Other intervertebral disc degeneration, lumbosacral region without mention of lumbar back pain or lower extremity pain: Secondary | ICD-10-CM | POA: Insufficient documentation

## 2011-07-09 DIAGNOSIS — M79605 Pain in left leg: Secondary | ICD-10-CM

## 2011-07-09 DIAGNOSIS — M47817 Spondylosis without myelopathy or radiculopathy, lumbosacral region: Secondary | ICD-10-CM | POA: Diagnosis not present

## 2011-07-18 DIAGNOSIS — M79609 Pain in unspecified limb: Secondary | ICD-10-CM | POA: Diagnosis not present

## 2011-07-18 DIAGNOSIS — I10 Essential (primary) hypertension: Secondary | ICD-10-CM | POA: Diagnosis not present

## 2011-07-18 DIAGNOSIS — E7800 Pure hypercholesterolemia, unspecified: Secondary | ICD-10-CM | POA: Diagnosis not present

## 2011-07-18 DIAGNOSIS — E1129 Type 2 diabetes mellitus with other diabetic kidney complication: Secondary | ICD-10-CM | POA: Diagnosis not present

## 2011-07-30 ENCOUNTER — Encounter: Payer: Medicare Other | Admitting: Physical Medicine and Rehabilitation

## 2011-08-03 ENCOUNTER — Encounter
Payer: Medicare Other | Attending: Physical Medicine and Rehabilitation | Admitting: Physical Medicine and Rehabilitation

## 2011-08-03 ENCOUNTER — Encounter: Payer: Self-pay | Admitting: Physical Medicine and Rehabilitation

## 2011-08-03 VITALS — BP 126/62 | HR 98 | Resp 18 | Ht 66.0 in | Wt 286.0 lb

## 2011-08-03 DIAGNOSIS — M545 Low back pain, unspecified: Secondary | ICD-10-CM

## 2011-08-03 DIAGNOSIS — E119 Type 2 diabetes mellitus without complications: Secondary | ICD-10-CM | POA: Diagnosis not present

## 2011-08-03 DIAGNOSIS — M48062 Spinal stenosis, lumbar region with neurogenic claudication: Secondary | ICD-10-CM | POA: Diagnosis not present

## 2011-08-03 DIAGNOSIS — M25562 Pain in left knee: Secondary | ICD-10-CM

## 2011-08-03 DIAGNOSIS — M238X9 Other internal derangements of unspecified knee: Secondary | ICD-10-CM

## 2011-08-03 DIAGNOSIS — M79604 Pain in right leg: Secondary | ICD-10-CM

## 2011-08-03 DIAGNOSIS — S83006A Unspecified dislocation of unspecified patella, initial encounter: Secondary | ICD-10-CM | POA: Diagnosis not present

## 2011-08-03 DIAGNOSIS — R2689 Other abnormalities of gait and mobility: Secondary | ICD-10-CM

## 2011-08-03 DIAGNOSIS — R269 Unspecified abnormalities of gait and mobility: Secondary | ICD-10-CM

## 2011-08-03 DIAGNOSIS — M79609 Pain in unspecified limb: Secondary | ICD-10-CM

## 2011-08-03 DIAGNOSIS — R29818 Other symptoms and signs involving the nervous system: Secondary | ICD-10-CM

## 2011-08-03 DIAGNOSIS — G9519 Other vascular myelopathies: Secondary | ICD-10-CM

## 2011-08-03 DIAGNOSIS — M25569 Pain in unspecified knee: Secondary | ICD-10-CM

## 2011-08-03 DIAGNOSIS — M542 Cervicalgia: Secondary | ICD-10-CM | POA: Diagnosis not present

## 2011-08-03 DIAGNOSIS — K219 Gastro-esophageal reflux disease without esophagitis: Secondary | ICD-10-CM | POA: Diagnosis not present

## 2011-08-03 DIAGNOSIS — S83003A Unspecified subluxation of unspecified patella, initial encounter: Secondary | ICD-10-CM

## 2011-08-03 DIAGNOSIS — M25561 Pain in right knee: Secondary | ICD-10-CM

## 2011-08-03 DIAGNOSIS — M25361 Other instability, right knee: Secondary | ICD-10-CM

## 2011-08-03 MED ORDER — DICLOFENAC SODIUM 1 % TD GEL: 1.0000 "application " | Freq: Four times a day (QID) | TRANSDERMAL | Status: DC

## 2011-08-03 NOTE — Patient Instructions (Addendum)
I have reviewed your x-rays with you today  I have ordered physical therapy to work on your balance and to help you with your knee pain  I have ordered an MRI of your lumbar spine  Will see you back in 6 weeks  I will call you if there is any thing significant on your MRI  Have ordered Voltaren gel for your knees

## 2011-08-03 NOTE — Progress Notes (Signed)
Subjective:    Patient ID: Stephanie Peck, female    DOB: May 30, 1960, 51 y.o.   MRN: [id number]  HPI  The patient is a 51 year old Caucasian lady  with a history of diabetes mellitus, hypertension, obstructive sleep  apnea, diastolic dysfunction with ejection fraction of 60%, minimal  coronary artery disease with last catheterization done in December 2006,  showing 30% stenosis of the circumflex artery, morbid obesity with BMI  of 48, and tobacco abuse.     The patient has a long history of low back pain, she states however it got a lot worse in about 2001. Her chief complaint is low back pain which is located at the lumbosacral junction and radiates bilaterally to the lateral hips. The pain is essentially bilateral. She reports approximately 3 month history of bilateral leg pain involving the entire leg to the toes which occurs when she is up walking 40 less than 5 minutes. Pain persists even after she sits down. No persistent numbness or tingling. Denies problems with bowel or bladder control. There is no history of trauma, motor vehicle accident, falls in the last 6 months.  Her second problem is bilateral knee pain left greater than right. She is seeing Dr. Rennis Chris who has told her she has osteo arthritis.she has undergone bilateral knee injections per Dr. Rennis Chris. These have helped the right knee but she still having problems with the left knee. She's been using a cane for ambulation for more than 10 years initially for the back down helps with her knees as well.  Her third problem is neck pain which began approximately 3 years ago. There is no history of injury. Pain is localized to the posterior neck radiates anteriorly to an area between the scapula. There is no referral of pain down either arm. Particularly painful for her approximately 3 days a week   Further discussion today with the patient after reviewing x-rays of neck knees and low back, patient reports she feels her biggest issue at  this time is pain in the leg including the lower leg when she is and ambulating. She differentiates this from knee pain per se. At this time she can walk 3-5 minutes before she gets a deep ache , crampy, intense pain from her thighs to her calves and into the feet. This is been worsening over the last 6 months for her affecting her function.         Pain Inventory Average Pain 7 Pain Right Now 8 My pain is sharp, stabbing and aching  In the last 24 hours, has pain interfered with the following? General activity 8 Relation with others 7 Enjoyment of life 7 What TIME of day is your pain at its worst? evening Sleep (in general) Poor  Pain is worse with: walking and standing Pain improves with: medication Relief from Meds: 5  Mobility use a cane ability to climb steps?  no do you drive?  yes  Function disabled: date disabled 2004 I need assistance with the following:  meal prep, household duties and shopping  Neuro/Psych weakness trouble walking spasms confusion depression  Prior Studies Any changes since last visit?  no  Physicians involved in your care Any changes since last visit?  no   Family History  Problem Relation Age of Onset  . Cancer Mother   . Depression Mother   . Heart disease Father   . Diabetes Father   . Hypertension Father   . Cancer Maternal Grandmother    History  Social History  . Marital Status: Married    Spouse Name: N/A    Number of Children: N/A  . Years of Education: N/A   Social History Main Topics  . Smoking status: Current Everyday Smoker  . Smokeless tobacco: None  . Alcohol Use: None  . Drug Use: None  . Sexually Active: None   Other Topics Concern  . None   Social History Narrative  . None   Past Surgical History  Procedure Date  . Cholecystectomy   . Gastric bypass   . Tonsillectomy    Past Medical History  Diagnosis Date  . Diabetes mellitus   . GERD (gastroesophageal reflux disease)   . Anxiety     . Depression    BP 126/62  Pulse 98  Resp 18  Ht 5\' 6"  (1.676 m)  Wt 286 lb (129.729 kg)  BMI 46.16 kg/m2  SpO2 99%      Review of Systems  HENT: Negative.   Eyes: Negative.   Respiratory: Negative.   Cardiovascular: Negative.   Gastrointestinal: Positive for nausea and diarrhea.  Genitourinary: Negative.   Musculoskeletal: Positive for back pain and gait problem.  Skin: Negative.   Neurological: Positive for weakness.       Spasms  Hematological: Negative.   Psychiatric/Behavioral: Positive for confusion.       Objective:   Physical Exam  The patient is an obese white female in no apparent distress.  She is oriented x3 her speech is clear her affect is bright she's alert cooperative and pleasant  She follows commands without difficulty answers questions appropriately  Cranial nerves II through XII are grossly intact  Coordination is grossly intact  Motor strength 5/5 upper and lower extremities without obvious focal deficit  Sensory exam decreased sensation below the knees, decreased pinprick L5 distribution  Decreased vibratory sensation in feet compared to hands  Upper extremity reflexes 3+  2-3+ at the knees, absent at ankles bilaterally  Straight leg raise negative  Rises from chair slowly, gait is slow and careful, antalgic, typically uses cane  Tandem gait not tested due to safety concerns (patient reports knee pain), Romberg test performed adequately    Decreased cervical range of motion noted   Increased pain in low back with extension rather than flexion Diminished lumbar range of motion, after flexing forward from the lumbar spine has difficulty returning to upright secondary to low back pain    Diffuse tenderness in the lumbosacral area specially paraspinal musculature  No tenderness over trochanter's bilaterally   Patella alta Knee exam without significant deformity or effusion, medial joint line and lateral joint line nontender, crepitus noted  with flexion extension, no obvious instability appreciated AP or ML.  12/24/08    Difficulty appreciating DP or PT pulses, feet are warm without open areas, toes are slightly dusky how ever.  Clinical Data: Severe mid back pain.  THORACOLUMBAR SPINE - 2 VIEW  Comparison: Chest x-ray of 01/16/2008  Findings: There are degenerative disc disease changes in the mid  thoracic spine with anterior flowing osteophytes. Normal  alignment. No fracture.  Mild degenerative changes in the lower lumbar spine with anterior  osteophytes. Normal alignment. No fracture. SI joints are  symmetric.  IMPRESSION:  Mild thoracic and lumbar spondylosis.  No acute findings.  Provider: Candice Demaegd RADIOLOGY REPORT*  Clinical Data: Chronic low back pain and leg pain.  LUMBAR SPINE - COMPLETE WITH BENDING VIEWS  Comparison: None.  Findings: Minimal levoscoliosis. No definitive pars defect  (difficult  to completely exclude a left L5 pars defect).  Degenerative changes facet joints L4-5 and L5 S1. Mild L3-4 and L5-  S1 disc space narrowing. No abnormal motion between flexion and  extension.  Mild bilateral sacroiliac joint degenerative changes.  Vascular calcifications.  IMPRESSION:  Minimal levoscoliosis.  Degenerative changes facet joints L4-5 and L5 S1.  Mild L3-4 and L5-S1 disc space narrowing.  No abnormal motion between flexion and extension.  Mild bilateral sacroiliac joint degenerative changes.  Vascular calcifications.  Original Report Authenticated By: Fuller Canada, M.D.       *RADIOLOGY REPORT*  Clinical Data: Chronic neck pain. No injury.  CERVICAL SPINE COMPLETE WITH FLEXION AND EXTENSION VIEWS  Comparison: None.  Findings: Mild degenerative changes C6-7.  No bony neural foraminal narrowing.  No abnormal motion between flexion and extension.  Lung apices clear  Degenerative changes thoracic spine.  IMPRESSION:  Mild degenerative changes C6-7.  Original Report Authenticated  By: Fuller Canada, M.D.      *RADIOLOGY REPORT*  Clinical Data: Knee pain. No injury.  BILATERAL KNEES STANDING - 1 VIEW  Comparison: None.  Findings: Single frontal standing view of both knees reveals  minimal left-sided medial tibiofemoral joint degenerative changes.  In the standing position, the patellar are subluxed laterally  bilaterally.  IMPRESSION:  Single frontal standing view of both knees reveals minimal left-  sided medial tibiofemoral joint degenerative changes.  In the standing position, the patellar are subluxed laterally  bilaterally.  Original Report Authenticated By: Fuller Canada, M.D.           Assessment & Plan:  1. Low back pain  2. Bilateral knee pain  3. Cervicalgia/Mid back pain  4. Gait disorder  5. Bilateral leg pain which radiates from the back to the feet especially with ambulation: considering neurogenic versus vascular causes.     Further discussion today with the patient after reviewing x-rays of neck knees and low back, patient reports she feels her biggest issue at this time is pain in the leg including the lower leg when she is and ambulating. She differentiates this from knee pain per se. At this time she can walk 3-5 minutes before she gets a deep ache , crampy, intense pain from her thighs to her calves and into the feet. This is been worsening over the last 6 months for her affecting her function.   I think her leg pain may be multifactorial in nature. On exam she is some signs of peripheral neuropathy, her history is suggestive of claudication but she also has an overlay of some knee involvement as well.   I would like to order MRI of the lumbar spine to evaluate for lumbar spinal stenosis and moving toward lumbar epidural steroid injection. I am also still considering vascular etiology as possible causes for lower extremity pain I will start with MRI.   She is also going to be starting physical therapy to address her knee pain  which is likely related to her lateral patellar subluxation. His knee pain is separated from her lower extremity pain which extends from thighs to foot.   I think she would benefit from physical therapy with an emphasis on a balance program and lower extremity strengthening. This would help prevent falls.    I discussed the above with the patient and her sister and they are in agreement with this plan so far.  I'll see her back in 6 weeks.

## 2011-08-13 ENCOUNTER — Ambulatory Visit
Admission: RE | Admit: 2011-08-13 | Discharge: 2011-08-13 | Disposition: A | Discharge status: Home / Self Care | Payer: Medicare Other | Source: Ambulatory Visit | Attending: Physical Medicine and Rehabilitation | Admitting: Physical Medicine and Rehabilitation

## 2011-08-13 DIAGNOSIS — M48062 Spinal stenosis, lumbar region with neurogenic claudication: Secondary | ICD-10-CM

## 2011-08-13 DIAGNOSIS — M5126 Other intervertebral disc displacement, lumbar region: Secondary | ICD-10-CM | POA: Diagnosis not present

## 2011-08-13 DIAGNOSIS — M47817 Spondylosis without myelopathy or radiculopathy, lumbosacral region: Secondary | ICD-10-CM | POA: Diagnosis not present

## 2011-08-13 DIAGNOSIS — R2689 Other abnormalities of gait and mobility: Secondary | ICD-10-CM

## 2011-08-13 DIAGNOSIS — M5137 Other intervertebral disc degeneration, lumbosacral region: Secondary | ICD-10-CM | POA: Diagnosis not present

## 2011-08-13 MED ORDER — GADOBENATE DIMEGLUMINE 529 MG/ML IV SOLN
20.0000 mL | Freq: Once | INTRAVENOUS | Status: AC | PRN
  Administered 2011-08-13: 20 mL via INTRAVENOUS

## 2011-08-29 ENCOUNTER — Ambulatory Visit: Payer: Medicare Other | Attending: Physical Medicine and Rehabilitation

## 2011-08-29 DIAGNOSIS — M6281 Muscle weakness (generalized): Secondary | ICD-10-CM | POA: Insufficient documentation

## 2011-08-29 DIAGNOSIS — M25569 Pain in unspecified knee: Secondary | ICD-10-CM | POA: Insufficient documentation

## 2011-08-29 DIAGNOSIS — R269 Unspecified abnormalities of gait and mobility: Secondary | ICD-10-CM | POA: Insufficient documentation

## 2011-08-29 DIAGNOSIS — IMO0001 Reserved for inherently not codable concepts without codable children: Secondary | ICD-10-CM | POA: Diagnosis not present

## 2011-08-29 DIAGNOSIS — R262 Difficulty in walking, not elsewhere classified: Secondary | ICD-10-CM | POA: Insufficient documentation

## 2011-09-03 ENCOUNTER — Ambulatory Visit: Payer: Medicare Other | Attending: Family Medicine

## 2011-09-03 DIAGNOSIS — R262 Difficulty in walking, not elsewhere classified: Secondary | ICD-10-CM | POA: Diagnosis not present

## 2011-09-03 DIAGNOSIS — IMO0001 Reserved for inherently not codable concepts without codable children: Secondary | ICD-10-CM | POA: Insufficient documentation

## 2011-09-03 DIAGNOSIS — M6281 Muscle weakness (generalized): Secondary | ICD-10-CM | POA: Insufficient documentation

## 2011-09-03 DIAGNOSIS — M25569 Pain in unspecified knee: Secondary | ICD-10-CM | POA: Insufficient documentation

## 2011-09-03 DIAGNOSIS — R269 Unspecified abnormalities of gait and mobility: Secondary | ICD-10-CM | POA: Insufficient documentation

## 2011-09-10 ENCOUNTER — Encounter
Payer: Medicare Other | Attending: Physical Medicine and Rehabilitation | Admitting: Physical Medicine and Rehabilitation

## 2011-09-10 ENCOUNTER — Encounter: Payer: Self-pay | Admitting: Physical Medicine and Rehabilitation

## 2011-09-10 VITALS — BP 129/49 | HR 103 | Resp 16 | Ht 67.0 in | Wt 288.0 lb

## 2011-09-10 DIAGNOSIS — M79605 Pain in left leg: Secondary | ICD-10-CM

## 2011-09-10 DIAGNOSIS — M545 Low back pain, unspecified: Secondary | ICD-10-CM | POA: Insufficient documentation

## 2011-09-10 DIAGNOSIS — M542 Cervicalgia: Secondary | ICD-10-CM | POA: Diagnosis not present

## 2011-09-10 DIAGNOSIS — M25561 Pain in right knee: Secondary | ICD-10-CM

## 2011-09-10 DIAGNOSIS — M79609 Pain in unspecified limb: Secondary | ICD-10-CM | POA: Diagnosis not present

## 2011-09-10 DIAGNOSIS — M25569 Pain in unspecified knee: Secondary | ICD-10-CM | POA: Insufficient documentation

## 2011-09-10 DIAGNOSIS — M549 Dorsalgia, unspecified: Secondary | ICD-10-CM | POA: Insufficient documentation

## 2011-09-10 DIAGNOSIS — M25562 Pain in left knee: Secondary | ICD-10-CM

## 2011-09-10 DIAGNOSIS — M79604 Pain in right leg: Secondary | ICD-10-CM

## 2011-09-10 NOTE — Patient Instructions (Signed)
I reviewed your lumbar MRI  We discussed various treatment options for your low back and your leg pain. I have given you 2 brochure's today to review on epidural steroid injections and medial branch blocks. I would like you to try physical therapy before we consider injections. Your currently working on your knees and physical therapy but I think we will need to focus on your back in the upcoming months.  You have decided to focus on your knee pain at this time in our engaged in a physical therapy program.  I understand that it is important that you pace yourself in physical therapy so that you can continue to participate.  We discussed changing your gabapentin from 2 600 mg pills in the morning and 600 mg at night to taking 600 mg 3 times a day.  We have discussed your oxycodone and I understand that at this time you would prefer your primary care to continue to fill your prescription for you.

## 2011-09-10 NOTE — Progress Notes (Signed)
Subjective:    Patient ID: Stephanie Peck, female    DOB: 1960/05/30, 51 y.o.   MRN: [id number]  HPI  The patient is a 51 year old Caucasian lady  with a history of diabetes mellitus, hypertension, obstructive sleep  apnea, diastolic dysfunction with ejection fraction of 60%, minimal  coronary artery disease with last catheterization done in December 2006,  showing 30% stenosis of the circumflex artery, morbid obesity with BMI  of 48, and tobacco abuse.    The patient has a long history of low back pain, she states however it got a lot worse in about 2001. Her chief complaint is low back pain which is located at the lumbosacral junction and radiates bilaterally to the lateral hips. The pain is essentially bilateral. She reports approximately 3 month history of bilateral leg pain involving the entire leg to the toes which occurs when she is up walking 40 less than 5 minutes. Pain persists even after she sits down. No persistent numbness or tingling. Denies problems with bowel or bladder control. There is no history of trauma, motor vehicle accident, falls in the last 6 months.    Her second problem is bilateral knee pain left greater than right. She is seeing Dr. Rennis Chris who has told her she has osteo arthritis.she has undergone bilateral knee injections per Dr. Rennis Chris. These have helped the right knee but she still having problems with the left knee. She's been using a cane for ambulation for more than 10 years initially for the back down helps with her knees as well.  Her third problem is neck pain which began approximately 3 years ago. There is no history of injury. Pain is localized to the posterior neck radiates anteriorly to an area between the scapula. There is no referral of pain down either arm. Particularly painful for her approximately 3 days a week  Further discussion today with the patient after reviewing x-rays of neck knees and low back, patient reports she feels her biggest issue at  this time is pain in the leg including the lower leg when she is and ambulating. She differentiates this from knee pain per se. At this time she can walk 3-5 minutes before she gets a deep ache , crampy, intense pain from her thighs to her calves and into the feet. This is been worsening over the last 6 months for her affecting her function.            Pain Inventory Average Pain 7 Pain Right Now 8 My pain is constant and sharp  In the last 24 hours, has pain interfered with the following? General activity 10 Relation with others 0 Enjoyment of life 10 What TIME of day is your pain at its worst? night Sleep (in general) Poor  Pain is worse with: standing and some activites Pain improves with: medication Relief from Meds: 6  Mobility walk with assistance use a cane use a walker how many minutes can you walk? 3-5 ability to climb steps?  yes do you drive?  yes Do you have any goals in this area?  yes  Function disabled: date disabled 2005  Neuro/Psych weakness numbness tingling trouble walking dizziness confusion depression anxiety  Prior Studies CT/MRI  Physicians involved in your care Any changes since last visit?  no   Family History  Problem Relation Age of Onset  . Cancer Mother   . Depression Mother   . Heart disease Father   . Diabetes Father   . Hypertension Father   .  Cancer Maternal Grandmother    History   Social History  . Marital Status: Married    Spouse Name: N/A    Number of Children: N/A  . Years of Education: N/A   Social History Main Topics  . Smoking status: Current Everyday Smoker  . Smokeless tobacco: None  . Alcohol Use: None  . Drug Use: None  . Sexually Active: None   Other Topics Concern  . None   Social History Narrative  . None   Past Surgical History  Procedure Date  . Cholecystectomy   . Gastric bypass   . Tonsillectomy    Past Medical History  Diagnosis Date  . Diabetes mellitus   . GERD  (gastroesophageal reflux disease)   . Anxiety   . Depression    BP 129/49  Pulse 103  Resp 16  Ht 5\' 7"  (1.702 m)  Wt 288 lb (130.636 kg)  BMI 45.11 kg/m2  SpO2 99%     Review of Systems  HENT: Positive for neck pain.   Respiratory: Positive for shortness of breath.   Musculoskeletal: Positive for myalgias, back pain, arthralgias and gait problem.  Neurological: Positive for dizziness, weakness and numbness.  Psychiatric/Behavioral: Positive for confusion and dysphoric mood. The patient is nervous/anxious.   All other systems reviewed and are negative.       Objective:   Physical Exam The patient is an obese white female in no apparent distress.  She is oriented x3 her speech is clear her affect is bright she's alert cooperative and pleasant  She follows commands without difficulty answers questions appropriately  Cranial nerves II through XII are grossly intact  Coordination is grossly intact  Motor strength 5/5 upper and lower extremities without obvious focal deficit  Sensory exam decreased sensation below the knees, decreased pinprick L5 distribution  Decreased vibratory sensation in feet compared to hands  Upper extremity reflexes 3+  2-3+ at the knees, absent at ankles bilaterally  Straight leg raise negative  Rises from chair slowly, gait is slow and careful, antalgic, typically uses cane  Tandem gait not tested due to safety concerns (patient reports knee pain), Romberg test performed adequately  Decreased cervical range of motion noted  Increased pain in low back with extension rather than flexion  Diminished lumbar range of motion, after flexing forward from the lumbar spine has difficulty returning to upright secondary to low back pain  Diffuse tenderness in the lumbosacral area specially paraspinal musculature  No tenderness over trochanter's bilaterally  Patella alta  Knee exam without significant deformity or effusion, medial joint line and lateral joint  line nontender, crepitus noted with flexion extension, no obvious instability appreciated AP or ML.  12/24/08  Difficulty appreciating DP or PT pulses, feet are warm without open areas, toes are slightly dusky how ever.  Clinical Data: Severe mid back pain.  THORACOLUMBAR SPINE - 2 VIEW  Comparison: Chest x-ray of 01/16/2008  Findings: There are degenerative disc disease changes in the mid  thoracic spine with anterior flowing osteophytes. Normal  alignment. No fracture.  Mild degenerative changes in the lower lumbar spine with anterior  osteophytes. Normal alignment. No fracture. SI joints are  symmetric.  IMPRESSION:  Mild thoracic and lumbar spondylosis.  No acute findings.  Provider: Candice Demaegd RADIOLOGY REPORT*  Clinical Data: Chronic low back pain and leg pain.  LUMBAR SPINE - COMPLETE WITH BENDING VIEWS  Comparison: None.  Findings: Minimal levoscoliosis. No definitive pars defect  (difficult to completely exclude a left L5  pars defect).  Degenerative changes facet joints L4-5 and L5 S1. Mild L3-4 and L5-  S1 disc space narrowing. No abnormal motion between flexion and  extension.  Mild bilateral sacroiliac joint degenerative changes.  Vascular calcifications.  IMPRESSION:  Minimal levoscoliosis.  Degenerative changes facet joints L4-5 and L5 S1.  Mild L3-4 and L5-S1 disc space narrowing.  No abnormal motion between flexion and extension.  Mild bilateral sacroiliac joint degenerative changes.  Vascular calcifications.  Original Report Authenticated By: Fuller Canada, M.D.  *RADIOLOGY REPORT*  Clinical Data: Chronic neck pain. No injury.  CERVICAL SPINE COMPLETE WITH FLEXION AND EXTENSION VIEWS  Comparison: None.  Findings: Mild degenerative changes C6-7.  No bony neural foraminal narrowing.  No abnormal motion between flexion and extension.  Lung apices clear  Degenerative changes thoracic spine.  IMPRESSION:  Mild degenerative changes C6-7.  Original Report  Authenticated By: Fuller Canada, M.D.     RADIOLOGY REPORT*  Clinical Data: Knee pain. No injury.  BILATERAL KNEES STANDING - 1 VIEW  Comparison: None.  Findings: Single frontal standing view of both knees reveals  minimal left-sided medial tibiofemoral joint degenerative changes.  In the standing position, the patellar are subluxed laterally  bilaterally.  IMPRESSION:  Single frontal standing view of both knees reveals minimal left-  sided medial tibiofemoral joint degenerative changes.  In the standing position, the patellar are subluxed laterally  bilaterally.  Original Report Authenticated By: Fuller Canada, M.D.  Lumbar MRI  Clinical Data: Chronic low back pain. Bilateral leg pain and  weakness.  MRI LUMBAR SPINE WITHOUT AND WITH CONTRAST  Technique: Multiplanar and multiecho pulse sequences of the lumbar  spine were obtained without and with intravenous contrast.  Contrast: 20mL MULTIHANCE GADOBENATE DIMEGLUMINE 529 MG/ML IV SOLN  Comparison: 07/09/2011  Findings: The lowest full intervertebral disk space is labeled L5-  S1. If procedural intervention is to be performed, careful  correlation with this numbering strategy is recommended.  The conus medullaris appears unremarkable. Conus level: T12-L1.  No significant vertebral subluxation.  Inversion recovery weighted images demonstrate no significant  abnormal vertebral or periligamentous edema.  No significant abnormal vertebral enhancement noted.  Additional findings at individual levels are as follows:  L2-3: No impingement. Minimal facet arthropathy.  L3-4: No impingement. Minimal facet arthropathy.  L4-5: Mild bilateral foraminal stenosis and borderline left  subarticular lateral recess stenosis due to disc bulge.  L5-S1: Mild bilateral foraminal stenosis and mild left  subarticular lateral recess stenosis due to disc bulge, facet  arthropathy, and a left lateral recess and foraminal disc  protrusion.    IMPRESSION:  1. Mild impingement at L4-5 and L5-S1 secondary to degenerative  disc disease and facet arthropathy. A left lateral recess and  foraminal disc protrusion at L5-S1 is contributory.  Original Report Authenticated By: Dellia Cloud, M.D.      Assessment & Plan:  1. Low back pain   2. Bilateral knee pain   3. Cervicalgia/Mid back pain   4. Gait disorder   5. Bilateral leg pain which radiates from the back to the feet especially with ambulation: considering neurogenic versus vascular causes.     Biggest issue at this time is pain in the leg including the lower leg when she is and ambulating.  She differentiates this from knee pain per se. At this time she can walk 3-5 minutes before she gets a deep ache , crampy, intense pain from her thighs to her calves and into the feet. This is been  worsening over the last 6 months for her affecting her function.    I think her leg pain may be multifactorial in nature. On exam she is some signs of peripheral neuropathy, her history is suggestive of claudication but she also has an overlay of some knee involvement as well.    She is  going to physical therapy to address her knee pain which is likely related to her lateral patellar subluxation. She has had approximately 3 sessions with the physical therapist and they are addressing muscular imbalance and flexibility deficits in her lower extremities.   This knee pain is separated from her lower extremity pain which extends from thighs to foot.  Today I briefly discussed lumbar epidural steroid injections and medial branch blocks of the lumbar spine. I also talked about physical therapy for spinal stenosis. However we discussed the best option would be to change her dozing schedule of her gabapentin from 1200 mg q. a.m. and 600 mg at at bedtime to 600 mg 3 times a day.   I think she would benefit from physical therapy with an emphasis on a balance program and lower extremity  strengthening. This would help prevent falls.   Is using a cane appropriately now.  We also discussed her oxycodone prescription. At this time she prefers to have her primary care filled prescription if he is willing to continue to do so for her. She tells me she needs 1 prescription every 3 months or so.  I will see her back in one month.    I discussed the above with the patient and her sister and they are in agreement with this plan so far.  I'll see her back in 6 weeks.

## 2011-09-12 ENCOUNTER — Ambulatory Visit: Payer: Medicare Other | Admitting: Physical Therapy

## 2011-09-12 DIAGNOSIS — IMO0001 Reserved for inherently not codable concepts without codable children: Secondary | ICD-10-CM | POA: Diagnosis not present

## 2011-09-12 DIAGNOSIS — M6281 Muscle weakness (generalized): Secondary | ICD-10-CM | POA: Diagnosis not present

## 2011-09-12 DIAGNOSIS — R262 Difficulty in walking, not elsewhere classified: Secondary | ICD-10-CM | POA: Diagnosis not present

## 2011-09-12 DIAGNOSIS — R269 Unspecified abnormalities of gait and mobility: Secondary | ICD-10-CM | POA: Diagnosis not present

## 2011-09-12 DIAGNOSIS — M25569 Pain in unspecified knee: Secondary | ICD-10-CM | POA: Diagnosis not present

## 2011-09-17 ENCOUNTER — Encounter: Payer: Medicare Other | Admitting: Physical Therapy

## 2011-09-19 ENCOUNTER — Ambulatory Visit: Payer: Medicare Other

## 2011-09-19 DIAGNOSIS — R269 Unspecified abnormalities of gait and mobility: Secondary | ICD-10-CM | POA: Diagnosis not present

## 2011-09-19 DIAGNOSIS — R262 Difficulty in walking, not elsewhere classified: Secondary | ICD-10-CM | POA: Diagnosis not present

## 2011-09-19 DIAGNOSIS — M25569 Pain in unspecified knee: Secondary | ICD-10-CM | POA: Diagnosis not present

## 2011-09-19 DIAGNOSIS — IMO0001 Reserved for inherently not codable concepts without codable children: Secondary | ICD-10-CM | POA: Diagnosis not present

## 2011-09-19 DIAGNOSIS — M6281 Muscle weakness (generalized): Secondary | ICD-10-CM | POA: Diagnosis not present

## 2011-09-24 ENCOUNTER — Ambulatory Visit: Payer: Medicare Other

## 2011-10-03 DIAGNOSIS — E1129 Type 2 diabetes mellitus with other diabetic kidney complication: Secondary | ICD-10-CM | POA: Diagnosis not present

## 2011-10-03 DIAGNOSIS — IMO0002 Reserved for concepts with insufficient information to code with codable children: Secondary | ICD-10-CM | POA: Diagnosis not present

## 2011-10-03 DIAGNOSIS — I10 Essential (primary) hypertension: Secondary | ICD-10-CM | POA: Diagnosis not present

## 2011-10-03 DIAGNOSIS — E669 Obesity, unspecified: Secondary | ICD-10-CM | POA: Diagnosis not present

## 2011-10-03 DIAGNOSIS — Z23 Encounter for immunization: Secondary | ICD-10-CM | POA: Diagnosis not present

## 2011-10-03 DIAGNOSIS — E1165 Type 2 diabetes mellitus with hyperglycemia: Secondary | ICD-10-CM | POA: Diagnosis not present

## 2011-10-03 DIAGNOSIS — E7800 Pure hypercholesterolemia, unspecified: Secondary | ICD-10-CM | POA: Diagnosis not present

## 2011-10-08 ENCOUNTER — Encounter: Payer: Medicare Other | Admitting: Physical Medicine and Rehabilitation

## 2011-10-10 ENCOUNTER — Encounter
Payer: Medicare Other | Attending: Physical Medicine and Rehabilitation | Admitting: Physical Medicine and Rehabilitation

## 2011-10-10 ENCOUNTER — Encounter: Payer: Self-pay | Admitting: Physical Medicine and Rehabilitation

## 2011-10-10 VITALS — BP 105/47 | HR 88 | Resp 18 | Ht 67.0 in | Wt 286.0 lb

## 2011-10-10 DIAGNOSIS — M25569 Pain in unspecified knee: Secondary | ICD-10-CM

## 2011-10-10 DIAGNOSIS — M25562 Pain in left knee: Secondary | ICD-10-CM

## 2011-10-10 DIAGNOSIS — M79605 Pain in left leg: Secondary | ICD-10-CM

## 2011-10-10 DIAGNOSIS — M79604 Pain in right leg: Secondary | ICD-10-CM

## 2011-10-10 DIAGNOSIS — M79609 Pain in unspecified limb: Secondary | ICD-10-CM | POA: Insufficient documentation

## 2011-10-10 DIAGNOSIS — M25551 Pain in right hip: Secondary | ICD-10-CM | POA: Insufficient documentation

## 2011-10-10 DIAGNOSIS — M25559 Pain in unspecified hip: Secondary | ICD-10-CM

## 2011-10-10 DIAGNOSIS — M25552 Pain in left hip: Secondary | ICD-10-CM | POA: Insufficient documentation

## 2011-10-10 DIAGNOSIS — M25561 Pain in right knee: Secondary | ICD-10-CM

## 2011-10-10 NOTE — Progress Notes (Addendum)
Subjective:    Patient ID: Stephanie Peck, female    DOB: [birth year], 51 y.o.   MRN: [id number]  HPI  The patient is a 51 year old Caucasian lady  with a history of diabetes mellitus, hypertension, obstructive sleep  apnea, diastolic dysfunction with ejection fraction of 60%, minimal  coronary artery disease with last catheterization done in December 2006,  showing 30% stenosis of the circumflex artery, morbid obesity with BMI  of 48, and tobacco abuse.  The patient has a long history of low back pain, she states however it got a lot worse in about 2001. Her chief complaint is low back pain which is located at the lumbosacral junction and radiates bilaterally to the lateral hips. The pain is essentially bilateral. She reports approximately 3 month history of bilateral leg pain involving the entire leg to the toes which occurs when she is up walking 40 less than 5 minutes. Pain persists even after she sits down. No persistent numbness or tingling. Denies problems with bowel or bladder control. There is no history of trauma, motor vehicle accident, falls in the last 6 months.  Her second problem is bilateral knee pain left greater than right. She is seeing Dr. Rennis Chris who has told her she has osteo arthritis.she has undergone bilateral knee injections per Dr. Rennis Chris. These have helped the right knee but she still having problems with the left knee. She's been using a cane for ambulation for more than 10 years initially for the back down helps with her knees as well.  Her third problem is neck pain which began approximately 3 years ago. There is no history of injury. Pain is localized to the posterior neck radiates anteriorly to an area between the scapula. There is no referral of pain down either arm. Particularly painful for her approximately 3 days a week  Further discussion today with the patient after reviewing x-rays of neck knees and low back, patient reports she feels her biggest issue at this time  is pain in the leg including the lower leg when she is and ambulating. She differentiates this from knee pain per se. At this time she can walk 3-5 minutes before she gets a deep ache , crampy, intense pain from her thighs to her calves and into the feet. This is been worsening over the last 6 months for her affecting her function.    Pain Inventory Average Pain 6 Pain Right Now 7 My pain is sharp, tingling and aching  In the last 24 hours, has pain interfered with the following? General activity 7 Relation with others 6 Enjoyment of life 7 What TIME of day is your pain at its worst? night Sleep (in general) Poor  Pain is worse with: walking, bending and standing Pain improves with: Nothing Relief from Meds: 0  Mobility walk with assistance use a cane use a walker ability to climb steps?  no do you drive?  yes  Function disabled: date disabled 2002 I need assistance with the following:  meal prep, household duties and shopping  Neuro/Psych weakness numbness trouble walking dizziness confusion depression  Prior Studies Any changes since last visit?  no  Physicians involved in your care Any changes since last visit?  no   Family History  Problem Relation Age of Onset  . Cancer Mother   . Depression Mother   . Heart disease Father   . Diabetes Father   . Hypertension Father   . Cancer Maternal Grandmother    History   Social  History  . Marital Status: Married    Spouse Name: N/A    Number of Children: N/A  . Years of Education: N/A   Social History Main Topics  . Smoking status: Current Every Day Smoker  . Smokeless tobacco: None  . Alcohol Use: None  . Drug Use: None  . Sexually Active: None   Other Topics Concern  . None   Social History Narrative  . None   Past Surgical History  Procedure Date  . Cholecystectomy   . Gastric bypass   . Tonsillectomy    Past Medical History  Diagnosis Date  . Diabetes mellitus   . GERD  (gastroesophageal reflux disease)   . Anxiety   . Depression    BP 105/47  Pulse 88  Resp 18  Ht 5\' 7"  (1.702 m)  Wt 286 lb (129.729 kg)  BMI 44.79 kg/m2  SpO2 97%      Review of Systems  HENT: Negative.   Eyes: Negative.   Respiratory: Negative.   Cardiovascular: Negative.   Gastrointestinal: Negative.   Genitourinary: Negative.   Musculoskeletal: Positive for back pain and gait problem.  Skin: Negative.   Neurological: Positive for dizziness, weakness and numbness.  Hematological: Negative.   Psychiatric/Behavioral: Positive for confusion.       Objective:   Physical Exam  The patient is an obese white female in no apparent distress.  She is oriented x3 her speech is clear her affect is bright she's alert cooperative and pleasant  She follows commands without difficulty answers questions appropriately  Cranial nerves II through XII are grossly intact  Coordination is grossly intact  Motor strength 5/5 upper and lower extremities without obvious focal deficit  Sensory exam decreased sensation below the knees, decreased pinprick L5 distribution  Decreased vibratory sensation in feet compared to hands  Upper extremity reflexes 3+  2-3+ at the knees, absent at ankles bilaterally  Straight leg raise negative  Rises from chair slowly, gait is slow and careful, antalgic, typically uses cane  Tandem gait not tested due to safety concerns (patient reports knee pain), Romberg test performed adequately  Decreased cervical range of motion noted  Increased pain in low back with extension rather than flexion  Diminished lumbar range of motion, after flexing forward from the lumbar spine has difficulty returning to upright secondary to low back pain  Diffuse tenderness in the lumbosacral area specially paraspinal musculature  No tenderness over trochanter's bilaterally  Patella alta  Knee exam without significant deformity or effusion, medial joint line and lateral joint line  nontender, crepitus noted with flexion extension, no obvious instability appreciated AP or ML.  Hip exam reveals significant decreased range of motion with internal rotation causing groin and posterior hip pain bilaterally.   12/24/08  Difficulty appreciating DP or PT pulses, feet are warm without open areas, toes are slightly dusky how ever.  Clinical Data: Severe mid back pain.     THORACOLUMBAR SPINE - 2 VIEW  Comparison: Chest x-ray of 01/16/2008  Findings: There are degenerative disc disease changes in the mid  thoracic spine with anterior flowing osteophytes. Normal  alignment. No fracture.  Mild degenerative changes in the lower lumbar spine with anterior  osteophytes. Normal alignment. No fracture. SI joints are  symmetric.  IMPRESSION:  Mild thoracic and lumbar spondylosis.  No acute findings.  Provider: Candice Demaegd RADIOLOGY REPORT*  Clinical Data: Chronic low back pain and leg pain.    LUMBAR SPINE - COMPLETE WITH BENDING VIEWS  Comparison:  None.  Findings: Minimal levoscoliosis. No definitive pars defect  (difficult to completely exclude a left L5 pars defect).  Degenerative changes facet joints L4-5 and L5 S1. Mild L3-4 and L5-  S1 disc space narrowing. No abnormal motion between flexion and  extension.  Mild bilateral sacroiliac joint degenerative changes.  Vascular calcifications.  IMPRESSION:  Minimal levoscoliosis.  Degenerative changes facet joints L4-5 and L5 S1.  Mild L3-4 and L5-S1 disc space narrowing.  No abnormal motion between flexion and extension.  Mild bilateral sacroiliac joint degenerative changes.  Vascular calcifications.  Original Report Authenticated By: Fuller Canada, M.D.  *RADIOLOGY REPORT*  Clinical Data: Chronic neck pain. No injury.  CERVICAL SPINE COMPLETE WITH FLEXION AND EXTENSION VIEWS  Comparison: None.  Findings: Mild degenerative changes C6-7.  No bony neural foraminal narrowing.  No abnormal motion between flexion  and extension.  Lung apices clear  Degenerative changes thoracic spine.  IMPRESSION:  Mild degenerative changes C6-7.  Original Report Authenticated By: Fuller Canada, M.D.   RADIOLOGY REPORT*  Clinical Data: Knee pain. No injury.      BILATERAL KNEES STANDING - 1 VIEW  Comparison: None.  Findings: Single frontal standing view of both knees reveals  minimal left-sided medial tibiofemoral joint degenerative changes.  In the standing position, the patellar are subluxed laterally  bilaterally.  IMPRESSION:  Single frontal standing view of both knees reveals minimal left-  sided medial tibiofemoral joint degenerative changes.  In the standing position, the patellar are subluxed laterally  bilaterally.  Original Report Authenticated By: Fuller Canada, M.D.  Lumbar MRI  Clinical Data: Chronic low back pain. Bilateral leg pain and  weakness.     MRI LUMBAR SPINE WITHOUT AND WITH CONTRAST  Technique: Multiplanar and multiecho pulse sequences of the lumbar  spine were obtained without and with intravenous contrast.  Contrast: 20mL MULTIHANCE GADOBENATE DIMEGLUMINE 529 MG/ML IV SOLN  Comparison: 07/09/2011  Findings: The lowest full intervertebral disk space is labeled L5-  S1. If procedural intervention is to be performed, careful  correlation with this numbering strategy is recommended.  The conus medullaris appears unremarkable. Conus level: T12-L1.  No significant vertebral subluxation.  Inversion recovery weighted images demonstrate no significant  abnormal vertebral or periligamentous edema.  No significant abnormal vertebral enhancement noted.  Additional findings at individual levels are as follows:  L2-3: No impingement. Minimal facet arthropathy.  L3-4: No impingement. Minimal facet arthropathy.  L4-5: Mild bilateral foraminal stenosis and borderline left  subarticular lateral recess stenosis due to disc bulge.  L5-S1: Mild bilateral foraminal stenosis and mild  left  subarticular lateral recess stenosis due to disc bulge, facet  arthropathy, and a left lateral recess and foraminal disc  protrusion.        Assessment & Plan:  1. Low back pain    2. Bilateral knee pain    3. Cervicalgia/Mid back pain     4. Gait disorder     5. Bilateral leg pain which radiates from the back to the feet especially with ambulation: considering neurogenic versus vascular causes. We'll discuss further with primary care vascular calcifications noted on recent x-rays.  6. Hip pain with decreased internal range of motion and pain with range of motion will obtain hip radiographs  Biggest issue at this time is pain in the leg including the lower leg when she is and ambulating.  She differentiates this from knee pain per se. At this time she can walk 3-5 minutes before she gets a deep ache ,  crampy, intense pain from her thighs to her calves and into the feet. This is been worsening over the last 6 months for her affecting her function.    I think her leg pain may be multifactorial in nature. On exam she is some signs of peripheral neuropathy, her history is suggestive of claudication but she also has an overlay of some knee involvement as well.    She is going to physical therapy to address her knee pain which is likely related to her lateral patellar subluxation.They are addressing muscular imbalance and flexibility deficits in her lower extremities.    This knee pain is separated from her lower extremity pain which extends from thighs to foot.   I think she would benefit from physical therapy with an emphasis on a balance program and lower extremity strengthening. This would help prevent falls.  Is using a cane appropriately now.    We also discussed her oxycodone prescription. At this time she prefers to have her primary care filled prescription if he is willing to continue to do so for her. She tells me she needs 1 prescription every 3 months or so.     I will see her back in one month.  I discussed the above with the patient and her sister and they are in agreement with this plan so far.  I'll see her back in 6 weeks.   Discussed case with Dr. Rhona Leavens nurse at 2:00pm 10/10/11

## 2011-10-10 NOTE — Patient Instructions (Signed)
I am ordering hip x-rays  I am encouraging you to continue physical therapy  I will see you back in one month  I. will try to get a hold of Dr. Clyde Canterbury at [facility]

## 2011-10-17 ENCOUNTER — Other Ambulatory Visit: Payer: Self-pay | Admitting: Family Medicine

## 2011-10-17 DIAGNOSIS — I739 Peripheral vascular disease, unspecified: Secondary | ICD-10-CM

## 2011-10-18 ENCOUNTER — Inpatient Hospital Stay: Admission: RE | Admit: 2011-10-18 | Payer: Medicare Other | Source: Ambulatory Visit

## 2011-10-23 ENCOUNTER — Ambulatory Visit
Admission: RE | Admit: 2011-10-23 | Discharge: 2011-10-23 | Disposition: A | Discharge status: Home / Self Care | Payer: Medicare Other | Source: Ambulatory Visit | Attending: Family Medicine | Admitting: Family Medicine

## 2011-10-23 DIAGNOSIS — I739 Peripheral vascular disease, unspecified: Secondary | ICD-10-CM | POA: Diagnosis not present

## 2011-10-23 DIAGNOSIS — I999 Unspecified disorder of circulatory system: Secondary | ICD-10-CM | POA: Diagnosis not present

## 2011-10-25 ENCOUNTER — Other Ambulatory Visit: Payer: Self-pay

## 2011-10-25 DIAGNOSIS — I70219 Atherosclerosis of native arteries of extremities with intermittent claudication, unspecified extremity: Secondary | ICD-10-CM

## 2011-11-07 ENCOUNTER — Encounter: Payer: Medicare Other | Admitting: Physical Medicine and Rehabilitation

## 2011-11-16 ENCOUNTER — Encounter: Payer: Self-pay | Admitting: Surgery

## 2011-11-19 ENCOUNTER — Encounter: Payer: Self-pay | Admitting: Surgery

## 2011-11-19 ENCOUNTER — Ambulatory Visit (INDEPENDENT_AMBULATORY_CARE_PROVIDER_SITE_OTHER): Payer: Medicare Other | Admitting: Vascular Surgery

## 2011-11-19 ENCOUNTER — Ambulatory Visit (INDEPENDENT_AMBULATORY_CARE_PROVIDER_SITE_OTHER): Payer: Medicare Other | Admitting: Surgery

## 2011-11-19 ENCOUNTER — Encounter (INDEPENDENT_AMBULATORY_CARE_PROVIDER_SITE_OTHER): Payer: Medicare Other | Admitting: Vascular Surgery

## 2011-11-19 VITALS — BP 127/60 | HR 93 | Ht 67.0 in | Wt 277.0 lb

## 2011-11-19 DIAGNOSIS — I70219 Atherosclerosis of native arteries of extremities with intermittent claudication, unspecified extremity: Secondary | ICD-10-CM | POA: Diagnosis not present

## 2011-11-19 NOTE — Progress Notes (Unsigned)
Bilateral lower extremity arterial duplex performed@ [facility] 11/19/2011

## 2011-11-19 NOTE — Progress Notes (Signed)
Ankle brachial indices performed @ [facility] 11/19/2011

## 2011-11-19 NOTE — Progress Notes (Signed)
[facility] [place]   Patient name: Stephanie Peck MRN: [id number] DOB: 10-01-1960 Sex: female   Referred by: Dr. Clyde Canterbury  Reason for referral:  Chief Complaint  Patient presents with  . PVD    New pt, intermittent claudication/Dr, [facility]    HISTORY OF PRESENT ILLNESS: This is a very pleasant 51 year old female who is referred for evaluation of leg pain. The patient has suffered from chronic leg pain secondary to lower back issues, however approximately 6 months ago she began experiencing a new kind of leg pain. She states that her legs give out with walking at approximately 50 feet. Both legs were unaffected the same. Pain does improve with rest. She describes pain going down both legs and her legs becoming numb.  The patient is a diabetic. Her most recent hemoglobin A1c was 7.3. She is medically managed for hypertension. She suffers from congestive heart failure with diastolic dysfunction. She has COPD from smoking but quit recently.  Past Medical History  Diagnosis Date  . Diabetes mellitus   . GERD (gastroesophageal reflux disease)   . Anxiety   . Depression   . CHF (congestive heart failure)   . COPD (chronic obstructive pulmonary disease)     Past Surgical History  Procedure Date  . Cholecystectomy   . Gastric bypass 2006  . Tonsillectomy     History   Social History  . Marital Status: Married    Spouse Name: N/A    Number of Children: N/A  . Years of Education: N/A   Occupational History  . Not on file.   Social History Main Topics  . Smoking status: Former Smoker    Types: Cigarettes    Start date: 09/30/2011  . Smokeless tobacco: Never Used  . Alcohol Use: No  . Drug Use: No  . Sexually Active: Not on file   Other Topics Concern  . Not on file   Social History Narrative  . No narrative on file    Family History  Problem Relation Age of Onset  . Cancer Mother   . Depression Mother   . Heart disease Father    before age 60  . Diabetes Father   . Hypertension Father   . Hyperlipidemia Father   . Heart attack Father   . Cancer Maternal Grandmother     Allergies as of 11/19/2011 - Review Complete 11/19/2011  Allergen Reaction Noted  . Codeine      Current Outpatient Prescriptions on File Prior to Visit  Medication Sig Dispense Refill  . buPROPion (WELLBUTRIN XL) 300 MG 24 hr tablet Take 300 mg by mouth daily.      . calcium carbonate (OS-CAL) 600 MG TABS Take 600 mg by mouth 2 (two) times daily with a meal.      . diclofenac sodium (VOLTAREN) 1 % GEL Apply 1 application topically 4 (four) times daily.  3 Tube  1  . DULoxetine (CYMBALTA) 60 MG capsule Take 60 mg by mouth daily.      . ferrous fumarate (HEMOCYTE - 106 MG FE) 325 (106 FE) MG TABS Take 1 tablet by mouth.      . furosemide (LASIX) 80 MG tablet Take 80 mg by mouth as needed.      . gabapentin (NEURONTIN) 600 MG tablet Take 600 mg by mouth 4 (four) times daily.      Marland Kitchen glipiZIDE (GLUCOTROL XL) 5 MG 24 hr tablet Take 5 mg by mouth daily.      Marland Kitchen  lansoprazole (PREVACID) 30 MG capsule Take 30 mg by mouth 2 (two) times daily.      . Multiple Vitamin (MULTIVITAMIN) tablet Take 1 tablet by mouth daily.      Marland Kitchen oxycodone (OXY-IR) 5 MG capsule Take 5 mg by mouth as needed.      . promethazine (PHENERGAN) 25 MG tablet Take 25 mg by mouth as needed.      . simvastatin (ZOCOR) 20 MG tablet Take 20 mg by mouth every evening.      . sitaGLIPtan-metformin (JANUMET) 50-1000 MG per tablet Take 1 tablet by mouth 2 (two) times daily with a meal.      . SUMAtriptan (IMITREX) 25 MG tablet Take 25 mg by mouth as needed.      Marland Kitchen telmisartan-hydrochlorothiazide (MICARDIS HCT) 80-25 MG per tablet Take 1 tablet by mouth daily.      Marland Kitchen topiramate (TOPAMAX) 50 MG tablet Take 50 mg by mouth 2 (two) times daily.      . vitamin B-12 (CYANOCOBALAMIN) 250 MCG tablet Take 250 mcg by mouth daily.         REVIEW OF SYSTEMS: Cardiovascular: No chest pain, chest  pressure, palpitations, orthopnea, or dyspnea on exertion.  Pulmonary: No productive cough, asthma or wheezing. Neurologic: No weakness, paresthesias, aphasia, or amaurosis. No dizziness. Hematologic: No bleeding problems or clotting disorders. Musculoskeletal: No joint pain or joint swelling. Chronic back pain Gastrointestinal: No blood in stool or hematemesis Genitourinary: No dysuria or hematuria. Psychiatric:: No history of major depression. Integumentary: No rashes or ulcers. Constitutional: No fever or chills.  PHYSICAL EXAMINATION: General: The patient appears their stated age.  Vital signs are BP 127/60  Pulse 93  Ht 5\' 7"  (1.702 m)  Wt 277 lb (125.646 kg)  BMI 43.38 kg/m2  SpO2 99% HEENT:  No gross abnormalities Pulmonary: Respirations are non-labored Abdomen: Soft and non-tender , obese Musculoskeletal: There are no major deformities.   Neurologic: No focal weakness or paresthesias are detected, Skin: There are no ulcer or rashes noted. Psychiatric: The patient has normal affect. Cardiovascular: There is a regular rate and rhythm without significant murmur appreciated. Palpable right femoral pulse. I had difficulty palpating the left femoral pulse. Pedal pulses are not palpable.  Diagnostic Studies: Duplex was performed today which is been ordered and reviewed ABI on the right is 0.89 on the left is 0.79. There appears to be a high-grade stenosis in the mid right superficial femoral artery and within the left common femoral artery.  Outside Studies/Documentation Historical records were reviewed.  They showed peripheral vascular disease and diminished ABIs bilaterally  Medication Changes: None  Assessment:  Bilateral leg pain , somewhat atypical for vascular insufficiency Plan: The patient does describe exercised dependent leg pain. Her symptoms are somewhat atypical for claudication as she does not experience cramping. She also has symptoms from her hips down as well  as numbness. However, she does have episodes where her legs give out with activity. Ultrasound today revealed slightly diminished ABI on the right and an ABI in the claudication range on the left. Because the patient has had chronic back pain I suspected there is a component of that within her symptoms. However given that she experienced new symptoms about 6 months ago which are now related to exercise , there is probably a component of vascular insufficiency. For that reason I have recommended proceeding with angiography to better define her anatomy and to see what her options are. If she is a candidate for stenting I will  proceed at that time. We discussed the risks of the procedure including the risk of embolization as well as the risk of bleeding. I will plan on accessing the left groin, studying both legs and intervening in the right superficial femoral artery if necessary. Her procedure has been scheduled for Tuesday, October 29     V. Charlena Cross, M.D. [facility] Office: [phone] Pager:  [phone]

## 2011-11-20 ENCOUNTER — Encounter (HOSPITAL_COMMUNITY): Payer: Self-pay | Admitting: Pharmacy Technician

## 2011-11-21 ENCOUNTER — Other Ambulatory Visit: Payer: Self-pay

## 2011-11-21 DIAGNOSIS — J019 Acute sinusitis, unspecified: Secondary | ICD-10-CM | POA: Diagnosis not present

## 2011-11-21 DIAGNOSIS — J441 Chronic obstructive pulmonary disease with (acute) exacerbation: Secondary | ICD-10-CM | POA: Diagnosis not present

## 2011-12-04 ENCOUNTER — Other Ambulatory Visit: Payer: Self-pay | Admitting: *Deleted

## 2011-12-05 ENCOUNTER — Ambulatory Visit (HOSPITAL_COMMUNITY)
Admission: RE | Admit: 2011-12-05 | Discharge: 2011-12-05 | Disposition: A | Discharge status: Home / Self Care | Payer: Medicare Other | Source: Ambulatory Visit | Attending: Surgery | Admitting: Surgery

## 2011-12-05 ENCOUNTER — Encounter (HOSPITAL_COMMUNITY)
Admission: RE | Disposition: A | Discharge status: Home / Self Care | Payer: Self-pay | Source: Ambulatory Visit | Attending: Surgery

## 2011-12-05 DIAGNOSIS — J449 Chronic obstructive pulmonary disease, unspecified: Secondary | ICD-10-CM | POA: Diagnosis not present

## 2011-12-05 DIAGNOSIS — E119 Type 2 diabetes mellitus without complications: Secondary | ICD-10-CM | POA: Diagnosis not present

## 2011-12-05 DIAGNOSIS — F411 Generalized anxiety disorder: Secondary | ICD-10-CM | POA: Diagnosis not present

## 2011-12-05 DIAGNOSIS — Z79899 Other long term (current) drug therapy: Secondary | ICD-10-CM | POA: Insufficient documentation

## 2011-12-05 DIAGNOSIS — I509 Heart failure, unspecified: Secondary | ICD-10-CM | POA: Insufficient documentation

## 2011-12-05 DIAGNOSIS — I70219 Atherosclerosis of native arteries of extremities with intermittent claudication, unspecified extremity: Secondary | ICD-10-CM | POA: Insufficient documentation

## 2011-12-05 DIAGNOSIS — F329 Major depressive disorder, single episode, unspecified: Secondary | ICD-10-CM | POA: Diagnosis not present

## 2011-12-05 DIAGNOSIS — I10 Essential (primary) hypertension: Secondary | ICD-10-CM | POA: Diagnosis not present

## 2011-12-05 DIAGNOSIS — K219 Gastro-esophageal reflux disease without esophagitis: Secondary | ICD-10-CM | POA: Insufficient documentation

## 2011-12-05 DIAGNOSIS — I5030 Unspecified diastolic (congestive) heart failure: Secondary | ICD-10-CM | POA: Insufficient documentation

## 2011-12-05 DIAGNOSIS — J4489 Other specified chronic obstructive pulmonary disease: Secondary | ICD-10-CM | POA: Insufficient documentation

## 2011-12-05 DIAGNOSIS — Z7902 Long term (current) use of antithrombotics/antiplatelets: Secondary | ICD-10-CM | POA: Insufficient documentation

## 2011-12-05 DIAGNOSIS — M79609 Pain in unspecified limb: Secondary | ICD-10-CM

## 2011-12-05 DIAGNOSIS — F3289 Other specified depressive episodes: Secondary | ICD-10-CM | POA: Insufficient documentation

## 2011-12-05 HISTORY — PX: LOWER EXTREMITY ANGIOGRAM: SHX5508

## 2011-12-05 HISTORY — PX: FEMORAL ARTERY STENT: SHX1583

## 2011-12-05 LAB — POCT I-STAT, CHEM 8
BUN: 12 mg/dL (ref 6–23)
Calcium, Ion: 1.14 mmol/L (ref 1.12–1.23)
Creatinine, Ser: 1.3 mg/dL — ABNORMAL HIGH (ref 0.50–1.10)
Glucose, Bld: 200 mg/dL — ABNORMAL HIGH (ref 70–99)
Hemoglobin: 15.3 g/dL — ABNORMAL HIGH (ref 12.0–15.0)
Sodium: 141 mEq/L (ref 135–145)
TCO2: 22 mmol/L (ref 0–100)

## 2011-12-05 SURGERY — ANGIOGRAM, LOWER EXTREMITY
Anesthesia: LOCAL | Laterality: Bilateral

## 2011-12-05 MED ORDER — ACETAMINOPHEN 325 MG RE SUPP: 325.0000 mg | RECTAL | Status: DC | PRN

## 2011-12-05 MED ORDER — CLOPIDOGREL BISULFATE 75 MG PO TABS: 75.0000 mg | ORAL_TABLET | Freq: Every day | ORAL | Status: DC

## 2011-12-05 MED ORDER — HYDRALAZINE HCL 20 MG/ML IJ SOLN: 10.0000 mg | INTRAMUSCULAR | Status: DC | PRN

## 2011-12-05 MED ORDER — HEPARIN SODIUM (PORCINE) 1000 UNIT/ML IJ SOLN
INTRAMUSCULAR | Status: AC
  Filled 2011-12-05: qty 1

## 2011-12-05 MED ORDER — ACETAMINOPHEN 325 MG PO TABS
ORAL_TABLET | ORAL | Status: AC
  Filled 2011-12-05: qty 2

## 2011-12-05 MED ORDER — ALUM & MAG HYDROXIDE-SIMETH 200-200-20 MG/5ML PO SUSP: 15.0000 mL | ORAL | Status: DC | PRN

## 2011-12-05 MED ORDER — METOPROLOL TARTRATE 1 MG/ML IV SOLN: 2.0000 mg | INTRAVENOUS | Status: DC | PRN

## 2011-12-05 MED ORDER — MIDAZOLAM HCL 2 MG/2ML IJ SOLN
INTRAMUSCULAR | Status: AC
  Filled 2011-12-05: qty 2

## 2011-12-05 MED ORDER — HEPARIN (PORCINE) IN NACL 2-0.9 UNIT/ML-% IJ SOLN
INTRAMUSCULAR | Status: AC
  Filled 2011-12-05: qty 500

## 2011-12-05 MED ORDER — LIDOCAINE HCL (PF) 1 % IJ SOLN
INTRAMUSCULAR | Status: AC
  Filled 2011-12-05: qty 30

## 2011-12-05 MED ORDER — ONDANSETRON HCL 4 MG/2ML IJ SOLN: 4.0000 mg | Freq: Four times a day (QID) | INTRAMUSCULAR | Status: DC | PRN

## 2011-12-05 MED ORDER — GUAIFENESIN-DM 100-10 MG/5ML PO SYRP: 15.0000 mL | ORAL_SOLUTION | ORAL | Status: DC | PRN

## 2011-12-05 MED ORDER — LABETALOL HCL 5 MG/ML IV SOLN: 10.0000 mg | INTRAVENOUS | Status: DC | PRN

## 2011-12-05 MED ORDER — FENTANYL CITRATE 0.05 MG/ML IJ SOLN
INTRAMUSCULAR | Status: AC
  Filled 2011-12-05: qty 2

## 2011-12-05 MED ORDER — PHENOL 1.4 % MT LIQD: 1.0000 | OROMUCOSAL | Status: DC | PRN

## 2011-12-05 MED ORDER — FENTANYL CITRATE 0.05 MG/ML IJ SOLN
25.0000 ug | INTRAMUSCULAR | Status: DC | PRN
  Administered 2011-12-05: 25 ug via INTRAVENOUS

## 2011-12-05 MED ORDER — ACETAMINOPHEN 325 MG PO TABS
325.0000 mg | ORAL_TABLET | ORAL | Status: DC | PRN
  Administered 2011-12-05: 650 mg via ORAL

## 2011-12-05 MED ORDER — SODIUM CHLORIDE 0.9 % IV SOLN: 1.0000 mL/kg/h | INTRAVENOUS | Status: DC

## 2011-12-05 MED ORDER — SODIUM CHLORIDE 0.9 % IV SOLN
INTRAVENOUS | Status: DC
  Administered 2011-12-05: 07:00:00 via INTRAVENOUS

## 2011-12-05 NOTE — Interval H&P Note (Signed)
History and Physical Interval Note:  12/05/2011 8:50 AM  Stephanie Peck  has presented today for surgery, with the diagnosis of PVD  The various methods of treatment have been discussed with the patient and family. After consideration of risks, benefits and other options for treatment, the patient has consented to  Procedure(s) (LRB) with comments: LOWER EXTREMITY ANGIOGRAM (Bilateral) as a surgical intervention .  The patient's history has been reviewed, patient examined, no change in status, stable for surgery.  I have reviewed the patient's chart and labs.  Questions were answered to the patient's satisfaction.     BRABHAM IV, V. WELLS

## 2011-12-05 NOTE — H&P (View-Only) (Signed)
[facility]   Patient name: Stephanie Peck MRN: [id number] DOB: 05/14/1960 Sex: female   Referred by: Dr. Westerman  Reason for referral:  Chief Complaint  Patient presents with  . PVD    New pt, intermittent claudication/Dr, [place]    HISTORY OF PRESENT ILLNESS: This is a very pleasant 51-year-old female who is referred for evaluation of leg pain. The patient has suffered from chronic leg pain secondary to lower back issues, however approximately 6 months ago she began experiencing a new kind of leg pain. She states that her legs give out with walking at approximately 50 feet. Both legs were unaffected the same. Pain does improve with rest. She describes pain going down both legs and her legs becoming numb.  The patient is a diabetic. Her most recent hemoglobin A1c was 7.3. She is medically managed for hypertension. She suffers from congestive heart failure with diastolic dysfunction. She has COPD from smoking but quit recently.  Past Medical History  Diagnosis Date  . Diabetes mellitus   . GERD (gastroesophageal reflux disease)   . Anxiety   . Depression   . CHF (congestive heart failure)   . COPD (chronic obstructive pulmonary disease)     Past Surgical History  Procedure Date  . Cholecystectomy   . Gastric bypass 2006  . Tonsillectomy     History   Social History  . Marital Status: Married    Spouse Name: N/A    Number of Children: N/A  . Years of Education: N/A   Occupational History  . Not on file.   Social History Main Topics  . Smoking status: Former Smoker    Types: Cigarettes    Start date: 09/30/2011  . Smokeless tobacco: Never Used  . Alcohol Use: No  . Drug Use: No  . Sexually Active: Not on file   Other Topics Concern  . Not on file   Social History Narrative  . No narrative on file    Family History  Problem Relation Age of Onset  . Cancer Mother   . Depression Mother   . Heart disease Father    before age 60  . Diabetes Father   . Hypertension Father   . Hyperlipidemia Father   . Heart attack Father   . Cancer Maternal Grandmother     Allergies as of 11/19/2011 - Review Complete 11/19/2011  Allergen Reaction Noted  . Codeine      Current Outpatient Prescriptions on File Prior to Visit  Medication Sig Dispense Refill  . buPROPion (WELLBUTRIN XL) 300 MG 24 hr tablet Take 300 mg by mouth daily.      . calcium carbonate (OS-CAL) 600 MG TABS Take 600 mg by mouth 2 (two) times daily with a meal.      . diclofenac sodium (VOLTAREN) 1 % GEL Apply 1 application topically 4 (four) times daily.  3 Tube  1  . DULoxetine (CYMBALTA) 60 MG capsule Take 60 mg by mouth daily.      . ferrous fumarate (HEMOCYTE - 106 MG FE) 325 (106 FE) MG TABS Take 1 tablet by mouth.      . furosemide (LASIX) 80 MG tablet Take 80 mg by mouth as needed.      . gabapentin (NEURONTIN) 600 MG tablet Take 600 mg by mouth 4 (four) times daily.      . glipiZIDE (GLUCOTROL XL) 5 MG 24 hr tablet Take 5 mg by mouth daily.      .   lansoprazole (PREVACID) 30 MG capsule Take 30 mg by mouth 2 (two) times daily.      . Multiple Vitamin (MULTIVITAMIN) tablet Take 1 tablet by mouth daily.      . oxycodone (OXY-IR) 5 MG capsule Take 5 mg by mouth as needed.      . promethazine (PHENERGAN) 25 MG tablet Take 25 mg by mouth as needed.      . simvastatin (ZOCOR) 20 MG tablet Take 20 mg by mouth every evening.      . sitaGLIPtan-metformin (JANUMET) 50-1000 MG per tablet Take 1 tablet by mouth 2 (two) times daily with a meal.      . SUMAtriptan (IMITREX) 25 MG tablet Take 25 mg by mouth as needed.      . telmisartan-hydrochlorothiazide (MICARDIS HCT) 80-25 MG per tablet Take 1 tablet by mouth daily.      . topiramate (TOPAMAX) 50 MG tablet Take 50 mg by mouth 2 (two) times daily.      . vitamin B-12 (CYANOCOBALAMIN) 250 MCG tablet Take 250 mcg by mouth daily.         REVIEW OF SYSTEMS: Cardiovascular: No chest pain, chest  pressure, palpitations, orthopnea, or dyspnea on exertion.  Pulmonary: No productive cough, asthma or wheezing. Neurologic: No weakness, paresthesias, aphasia, or amaurosis. No dizziness. Hematologic: No bleeding problems or clotting disorders. Musculoskeletal: No joint pain or joint swelling. Chronic back pain Gastrointestinal: No blood in stool or hematemesis Genitourinary: No dysuria or hematuria. Psychiatric:: No history of major depression. Integumentary: No rashes or ulcers. Constitutional: No fever or chills.  PHYSICAL EXAMINATION: General: The patient appears their stated age.  Vital signs are BP 127/60  Pulse 93  Ht 5' 7" (1.702 m)  Wt 277 lb (125.646 kg)  BMI 43.38 kg/m2  SpO2 99% HEENT:  No gross abnormalities Pulmonary: Respirations are non-labored Abdomen: Soft and non-tender , obese Musculoskeletal: There are no major deformities.   Neurologic: No focal weakness or paresthesias are detected, Skin: There are no ulcer or rashes noted. Psychiatric: The patient has normal affect. Cardiovascular: There is a regular rate and rhythm without significant murmur appreciated. Palpable right femoral pulse. I had difficulty palpating the left femoral pulse. Pedal pulses are not palpable.  Diagnostic Studies: Duplex was performed today which is been ordered and reviewed ABI on the right is 0.89 on the left is 0.79. There appears to be a high-grade stenosis in the mid right superficial femoral artery and within the left common femoral artery.  Outside Studies/Documentation Historical records were reviewed.  They showed peripheral vascular disease and diminished ABIs bilaterally  Medication Changes: None  Assessment:  Bilateral leg pain , somewhat atypical for vascular insufficiency Plan: The patient does describe exercised dependent leg pain. Her symptoms are somewhat atypical for claudication as she does not experience cramping. She also has symptoms from her hips down as well  as numbness. However, she does have episodes where her legs give out with activity. Ultrasound today revealed slightly diminished ABI on the right and an ABI in the claudication range on the left. Because the patient has had chronic back pain I suspected there is a component of that within her symptoms. However given that she experienced new symptoms about 6 months ago which are now related to exercise , there is probably a component of vascular insufficiency. For that reason I have recommended proceeding with angiography to better define her anatomy and to see what her options are. If she is a candidate for stenting I will   proceed at that time. We discussed the risks of the procedure including the risk of embolization as well as the risk of bleeding. I will plan on accessing the left groin, studying both legs and intervening in the right superficial femoral artery if necessary. Her procedure has been scheduled for Tuesday, October 29     V. Wells Yaniv Lage IV, M.D. [facility] Office: [phone] Pager:  [phone]   

## 2011-12-05 NOTE — Op Note (Signed)
[facility]  Patient name: Stephanie Peck MRN: [id number] DOB: 1960/04/17 Sex: female  12/05/2011 Pre-operative Diagnosis: Bilateral leg pain Post-operative diagnosis:  Same Surgeon:  Jorge Ny Procedure Performed:  1.  ultrasound access left femoral artery  2.  abdominal aortogram  3.  bilateral lower extremity runoff  4.  stent, right superficial femoral artery     Indications:  I have been following the patient for atypical bilateral lower extremity leg pain. She does have evidence of arterial insufficiency however her symptoms are somewhat atypical. She is looking for any kind of relief, and therefore I have decided to proceed with angiography to further evaluate her arterial stenosis and to possibly intervene.  Procedure:  The patient was identified in the holding area and taken to room 8.  The patient was then placed supine on the table and prepped and draped in the usual sterile fashion.  A time out was called.  Ultrasound was used to evaluate the left common femoral artery.  It was patent .  A digital ultrasound image was acquired.  A micropuncture needle was used to access the left common femoral artery under ultrasound guidance.  An 018 wire was advanced without resistance and a micropuncture sheath was placed.  The 018 wire was removed and a benson wire was placed.  The micropuncture sheath was exchanged for a 5 french sheath.  An omniflush catheter was advanced over the wire to the level of L-1.  An abdominal angiogram was obtained.  Next, using the omniflush catheter and a benson wire, the aortic bifurcation was crossed and the catheter was placed into theright external iliac artery and right runoff was obtained.  left runoff was performed via retrograde sheath injections.  Findings:   Aortogram:  The visualized portions of the suprarenal abdominal aorta showed no significant disease. There is no evidence of renal artery stenosis. The  infrarenal abdominal aorta is widely patent. Bilateral common iliac arteries are widely patent. The right external iliac artery and hypogastric arteries are widely patent. The left external iliac artery is patent however the sheath is nearly occlusive.  Right Lower Extremity:  The right common femoral artery is widely patent. The right fund femoral artery is widely patent. The right superficial femoral artery is patent however there is a approximately 80% stenosis within the adductor canal for a distance of about 20 mm. There is three-vessel runoff.  Left Lower Extremity:  The left common femoral artery is occluded. The left superficial femoral and profunda femoral artery are reconstituted and widely patent. The popliteal artery is patent throughout it's course. There is three-vessel runoff.  Intervention:  Over a Amplatz superstiff wire a 5 French 45 cm Ansel 1 sheath was advanced into the right external iliac artery. The patient was fully heparinized. I then used a 014 wire to access the right superficial femoral artery. It was then advanced across the stenosis. A Cook Zilver 6 x 40 was deployed across the stenosis and dilated using a 5 mm balloon. Followup angiography revealed resolution of the stenosis. The sheath was withdrawn into the left external iliac artery. She'll be taken the holding area for sheath pull once her coag  profile corrects.  Impression:  #1   successful stenting of the  Right superficial femoral artery stenosis using a Cook Zilver 6 x 40 self-expanding stent   #2   left common femoral artery occlusion    V. Durene Cal, M.D. [facility] Office:  [phone] Pager:  [phone]

## 2011-12-06 ENCOUNTER — Telehealth: Payer: Self-pay | Admitting: Surgery

## 2011-12-06 NOTE — Telephone Encounter (Signed)
Message copied by Margaretmary Eddy on Thu Dec 06, 2011 11:10 AM ------      Message from: Melene Plan      Created: Wed Dec 05, 2011 10:18 AM                   ----- Message -----         From: Nada Libman, MD         Sent: 12/05/2011  10:07 AM           To: Reuel Derby, Melene Plan, RN            12/05/2011:             1.  ultrasound access left femoral artery       2.  abdominal aortogram       3.  bilateral lower extremity runoff       4.  stent, right superficial femoral artery                   Please schedule the patient to see me in the office in 2 weeks for discussions of a left femoral endarterectomy

## 2011-12-21 ENCOUNTER — Encounter: Payer: Self-pay | Admitting: Surgery

## 2011-12-24 ENCOUNTER — Encounter: Payer: Self-pay | Admitting: Surgery

## 2011-12-24 ENCOUNTER — Ambulatory Visit (INDEPENDENT_AMBULATORY_CARE_PROVIDER_SITE_OTHER): Payer: Medicare Other | Admitting: Surgery

## 2011-12-24 VITALS — BP 128/67 | HR 91 | Ht 67.0 in | Wt 280.0 lb

## 2011-12-24 DIAGNOSIS — I70219 Atherosclerosis of native arteries of extremities with intermittent claudication, unspecified extremity: Secondary | ICD-10-CM

## 2011-12-24 NOTE — Progress Notes (Signed)
[facility] [place]   Patient name: Stephanie Peck MRN: [id number] DOB: December 28, 1973 Sex: female     Chief Complaint  Patient presents with  . PVD    discuss left fem-endarterectomy, s/p rt superficial fem sten 12/05/2011    HISTORY OF PRESENT ILLNESS: The patient is back today for followup. She is status post right superficial femoral artery stenting on October 29. This was done in the setting of bilateral claudication. She has multiple issues for leg pain, and has been dealing with back issues for many years. She states that her pain as of late has been affecting her mobility. She is only able to ambulate approximately 50 feet before her legs give out. For that reason proceeded with her stenting. Today she states she has noticed a dramatic improvement in her right leg. Her left leg still limits her.  Past Medical History  Diagnosis Date  . Diabetes mellitus   . GERD (gastroesophageal reflux disease)   . Anxiety   . Depression   . CHF (congestive heart failure)   . COPD (chronic obstructive pulmonary disease)     Past Surgical History  Procedure Date  . Cholecystectomy   . Gastric bypass 2006  . Tonsillectomy   . Femoral artery stent 12/05/2011    right superficial     History   Social History  . Marital Status: Married    Spouse Name: N/A    Number of Children: N/A  . Years of Education: N/A   Occupational History  . Not on file.   Social History Main Topics  . Smoking status: Former Smoker    Types: Cigarettes    Start date: 09/30/2011  . Smokeless tobacco: Never Used  . Alcohol Use: No  . Drug Use: No  . Sexually Active: Not on file   Other Topics Concern  . Not on file   Social History Narrative  . No narrative on file    Family History  Problem Relation Age of Onset  . Cancer Mother   . Depression Mother   . Heart disease Father     before age 62  . Diabetes Father   . Hypertension Father   . Hyperlipidemia Father   . Heart  attack Father   . Cancer Maternal Grandmother     Allergies as of 12/24/2011 (Patient 51) - Review Complete 12/24/2011  Allergen Reaction Noted  . Codeine      Current Outpatient Prescriptions on File Prior to Visit  Medication Sig Dispense Refill  . buPROPion (WELLBUTRIN XL) 300 MG 24 hr tablet Take 300 mg by mouth daily.      . calcium carbonate (OS-CAL) 600 MG TABS Take 600 mg by mouth 2 (two) times daily with a meal.      . diclofenac sodium (VOLTAREN) 1 % GEL Apply 1 application topically 4 (four) times daily.  3 Tube  1  . DULoxetine (CYMBALTA) 60 MG capsule Take 60 mg by mouth daily.      . ferrous fumarate (HEMOCYTE - 106 MG FE) 325 (106 FE) MG TABS Take 1 tablet by mouth.      . furosemide (LASIX) 80 MG tablet Take 80 mg by mouth as needed.      . gabapentin (NEURONTIN) 600 MG tablet Take 600 mg by mouth 4 (four) times daily.      Marland Kitchen glipiZIDE (GLUCOTROL XL) 5 MG 24 hr tablet Take 5 mg by mouth daily.      . lansoprazole (PREVACID) 30 MG capsule  Take 30 mg by mouth 2 (two) times daily.      . Multiple Vitamin (MULTIVITAMIN) tablet Take 1 tablet by mouth daily.      Marland Kitchen oxycodone (OXY-IR) 5 MG capsule Take 5 mg by mouth as needed.      . promethazine (PHENERGAN) 25 MG tablet Take 25 mg by mouth as needed.      . simvastatin (ZOCOR) 20 MG tablet Take 20 mg by mouth every evening.      . sitaGLIPtan-metformin (JANUMET) 50-1000 MG per tablet Take 1 tablet by mouth 2 (two) times daily with a meal.      . SUMAtriptan (IMITREX) 25 MG tablet Take 25 mg by mouth as needed.      Marland Kitchen telmisartan-hydrochlorothiazide (MICARDIS HCT) 80-25 MG per tablet Take 1 tablet by mouth daily.      Marland Kitchen topiramate (TOPAMAX) 50 MG tablet Take 50 mg by mouth 2 (two) times daily.      . vitamin B-12 (CYANOCOBALAMIN) 250 MCG tablet Take 250 mcg by mouth daily.         REVIEW OF SYSTEMS: No changes from prior visit  PHYSICAL EXAMINATION:   Vital signs are BP 128/67  Pulse 91  Ht 5\' 7"  (1.702 m)  Wt 280 lb (127.007 kg)   BMI 43.85 kg/m2  SpO2 100% General: The patient appears their stated age. HEENT:  No gross abnormalities Pulmonary:  Non labored breathing Abdomen: Soft and non-tender Musculoskeletal: There are no major deformities. Neurologic: No focal weakness or paresthesias are detected, Skin: There are no ulcer or rashes noted. Psychiatric: The patient has normal affect. Cardiovascular: There is a regular rate and rhythm without significant murmur appreciated.   Diagnostic Studies None  Assessment: Bilateral claudication Plan: The patient has noticed a significant improvement in her right leg symptoms and is here to pursue left leg revascularization. I discussed with her that because the stenosis is in her left common femoral artery, this would require surgical intervention. Because of her body habitus she is at extremely high risk for wound complications. I stressed this with her today. Her lifestyle has been severely hindered by her inability caused from her arterial insufficiency. We discussed trying to delay this procedure however her symptoms are such that she really wants to pursue it. She is well aware of the risks of 4 and complication as well as the need for surveillance, and possible future interventions. I have scheduled her for a left femoral endarterectomy to be performed on Thursday, December 12. She'll stop her Plavix 5 days prior. I'll as her cardiologist, Dr. Anne Fu to clear her for the operation.  Jorge Ny, M.D. [facility] [place] Office: [phone] Pager:  [phone]

## 2011-12-26 ENCOUNTER — Other Ambulatory Visit: Payer: Self-pay | Admitting: *Deleted

## 2011-12-26 DIAGNOSIS — J449 Chronic obstructive pulmonary disease, unspecified: Secondary | ICD-10-CM | POA: Diagnosis not present

## 2011-12-26 DIAGNOSIS — F17200 Nicotine dependence, unspecified, uncomplicated: Secondary | ICD-10-CM | POA: Diagnosis not present

## 2011-12-26 DIAGNOSIS — I739 Peripheral vascular disease, unspecified: Secondary | ICD-10-CM | POA: Diagnosis not present

## 2011-12-26 DIAGNOSIS — R0989 Other specified symptoms and signs involving the circulatory and respiratory systems: Secondary | ICD-10-CM | POA: Diagnosis not present

## 2011-12-26 DIAGNOSIS — Z01810 Encounter for preprocedural cardiovascular examination: Secondary | ICD-10-CM | POA: Diagnosis not present

## 2011-12-26 DIAGNOSIS — I10 Essential (primary) hypertension: Secondary | ICD-10-CM | POA: Diagnosis not present

## 2011-12-26 DIAGNOSIS — I5032 Chronic diastolic (congestive) heart failure: Secondary | ICD-10-CM | POA: Diagnosis not present

## 2012-01-03 DIAGNOSIS — R0609 Other forms of dyspnea: Secondary | ICD-10-CM | POA: Diagnosis not present

## 2012-01-03 DIAGNOSIS — I5032 Chronic diastolic (congestive) heart failure: Secondary | ICD-10-CM | POA: Diagnosis not present

## 2012-01-03 DIAGNOSIS — R0989 Other specified symptoms and signs involving the circulatory and respiratory systems: Secondary | ICD-10-CM | POA: Diagnosis not present

## 2012-01-04 ENCOUNTER — Encounter (HOSPITAL_COMMUNITY): Payer: Self-pay | Admitting: Pharmacy Technician

## 2012-01-07 ENCOUNTER — Ambulatory Visit (HOSPITAL_COMMUNITY)
Admission: RE | Admit: 2012-01-07 | Discharge: 2012-01-07 | Disposition: A | Discharge status: Home / Self Care | Payer: Medicare Other | Source: Ambulatory Visit | Attending: Anesthesiology | Admitting: Anesthesiology

## 2012-01-07 ENCOUNTER — Encounter (HOSPITAL_COMMUNITY)
Admission: RE | Admit: 2012-01-07 | Discharge: 2012-01-07 | Disposition: A | Discharge status: Home / Self Care | Payer: Medicare Other | Source: Ambulatory Visit | Attending: Surgery | Admitting: Surgery

## 2012-01-07 ENCOUNTER — Encounter (HOSPITAL_COMMUNITY): Payer: Self-pay

## 2012-01-07 ENCOUNTER — Other Ambulatory Visit: Payer: Self-pay | Admitting: *Deleted

## 2012-01-07 DIAGNOSIS — I70209 Unspecified atherosclerosis of native arteries of extremities, unspecified extremity: Secondary | ICD-10-CM | POA: Diagnosis not present

## 2012-01-07 DIAGNOSIS — Z01818 Encounter for other preprocedural examination: Secondary | ICD-10-CM | POA: Diagnosis not present

## 2012-01-07 DIAGNOSIS — Z01811 Encounter for preprocedural respiratory examination: Secondary | ICD-10-CM | POA: Diagnosis not present

## 2012-01-07 DIAGNOSIS — B373 Candidiasis of vulva and vagina: Secondary | ICD-10-CM

## 2012-01-07 DIAGNOSIS — J42 Unspecified chronic bronchitis: Secondary | ICD-10-CM | POA: Diagnosis not present

## 2012-01-07 LAB — COMPREHENSIVE METABOLIC PANEL
ALT: 20 U/L (ref 0–35)
BUN: 14 mg/dL (ref 6–23)
CO2: 25 mEq/L (ref 19–32)
Calcium: 10.2 mg/dL (ref 8.4–10.5)
Creatinine, Ser: 1.19 mg/dL — ABNORMAL HIGH (ref 0.50–1.10)
GFR calc Af Amer: 59 mL/min — ABNORMAL LOW (ref >90)
GFR calc non Af Amer: 51 mL/min — ABNORMAL LOW (ref >90)
Glucose, Bld: 119 mg/dL — ABNORMAL HIGH (ref 70–99)

## 2012-01-07 LAB — CBC
HCT: 43.5 % (ref 36.0–46.0)
Hemoglobin: 14.7 g/dL (ref 12.0–15.0)
MCH: 33.2 pg (ref 26.0–34.0)
MCV: 98.2 fL (ref 78.0–100.0)
RBC: 4.43 MIL/uL (ref 3.87–5.11)

## 2012-01-07 LAB — URINALYSIS, ROUTINE W REFLEX MICROSCOPIC
Bilirubin Urine: NEGATIVE
Nitrite: NEGATIVE
Specific Gravity, Urine: 1.011 (ref 1.005–1.030)
Urobilinogen, UA: 0.2 mg/dL (ref 0.0–1.0)

## 2012-01-07 MED ORDER — NYSTATIN-TRIAMCINOLONE 100000-0.1 UNIT/GM-% EX OINT: TOPICAL_OINTMENT | Freq: Two times a day (BID) | CUTANEOUS | Status: DC

## 2012-01-07 MED ORDER — FLUCONAZOLE 200 MG PO TABS: 200.0000 mg | ORAL_TABLET | Freq: Every day | ORAL | Status: DC

## 2012-01-07 NOTE — Progress Notes (Addendum)
1420    Left message with Verdon Cummins for copy of Stress test results. (pt had it done Friday 6th at Dr. Michele Rockers office--[facility]  [phone]).  1425  Have requested sleep study from [place] at [place] ?2008...had to [facility]...DA  1500  Pt has a new yeast infection in right groin area, with some new boils appearing in perineal area.  Spoke with Dr. Carmelina Dane patient to use Nystatin Cream and he will call a script in for Diflucan...and to call his office Wednesday to tell them if its cleared....the patient understands and is aware...da

## 2012-01-07 NOTE — Pre-Procedure Instructions (Addendum)
20 Stephanie Peck  01/07/2012   Your procedure is scheduled on: Thursday, December 12TH   Report to [facility] at  5:30 AM.  Call this number if you have problems the morning of surgery: [phone]   Remember:   Do not eat food or drink any liquids:After Midnight Wednesday.    Take these medicines the morning of surgery with A SIP OF WATER: Inhalers , Neb tx, Neurontin, Prevacid, topamax   Do not wear jewelry, make-up or nail polish.  Do not wear lotions, powders, or perfumes. You may NOT wear deodorant.  Do not shave 48 hours prior to surgery.    Do not bring valuables to the hospital.  Contacts, dentures or bridgework may not be worn into surgery.   Leave suitcase in the car. After surgery it may be brought to your room.  For patients admitted to the hospital, checkout time is 11:00 AM the day of discharge.   Patients discharged the day of surgery will not be allowed to drive home.  Name and phone number of your driver:  Sammie Bench --  [place]  Special Instructions: Shower using CHG 2 nights before surgery and the night before surgery.  If you shower the day of surgery use CHG.  Use special wash - you have one bottle of CHG for all showers.  You should use approximately 1/3 of the bottle for each shower.   Please read over the following fact sheets that you were given: Pain Booklet, Coughing and Deep Breathing, MRSA Information and Surgical Site Infection Prevention                   Dr. Myra Gianotti will call in a prescription for DiFlucan.Marland KitchenMarland KitchenIn the meantime, please use Nystatin Cream....            Then call Dr. Myra Gianotti on Wednesday to tell him if infection has cleared...Marland KitchenMarland Kitchen621 -[phone]

## 2012-01-08 ENCOUNTER — Encounter (HOSPITAL_COMMUNITY): Payer: Self-pay | Admitting: Vascular Surgery

## 2012-01-08 NOTE — Consult Note (Addendum)
Anesthesia chart review: Patient is a 51 year old female scheduled for left femoral endarterectomy by Dr. Myra Gianotti on 01/10/2012. History includes former smoker, morbid obesity, diabetes mellitus type 2, diastolic CHF, OSA (severe by 10/2006 sleep study), anxiety, GERD, COPD, cholecystectomy, tonsillectomy, gastric bypass, PAD s/p right SFA stent 12/05/11. Her PAT RN notes indicate that Dr. Myra Gianotti recently started her on treatment for candidiasis in her right groin region.  She also reported new "boils" in her perineal region.  She is on Nystatin and Diflucan, and she is suppose to contact [facility] (per Dr. Myra Gianotti) on 01/09/12 if these regions have not cleared.  PCP is Dr. Carolin Coy.  She was referred to Cardiologist Dr. Donato Schultz [facility]) on 12/26/11 for a pre-operative cardiology evaluation.  He follows her for diastolic dysfunction.  EKG on 12/05/11 showed sinus tachycardia, low voltage QRS, cannot rule out inferior infarct, age undetermined, cannot rule out anterior infarct, age undetermined.  It was not felt significant ly changed since her EKG on 09/27/09.  She had a stress test done at [facility] on 01/03/12, but the report is still pending.  Echo on 11/17/10 [facility]) showed normal LV size and function, EF 60-65%, no regional wall motion abnormalities, borderline left atrial enlargement, trivial tricuspid regurgitation, findings suggestive of grade 1 diastolic dysfunction without elevated left atrial pressure.  Cardiac cath on 01/04/2005 showed: "Minimal nonobstructive coronary artery disease with 30%  narrowing in the mid- to distal circumflex artery and no significant obstruction in the LAD and right coronary artery, and normal left ventricular function."  Chest x-ray on 01/07/2012 showed chronic bronchitic changes. No acute disease.  Preoperative labs noted.  T&S for the day of surgery.  I'll follow-up cardiology records once available.  Shonna Chock, PA-C 01/08/12  1017  Addendum: 01/09/12 1540 Nuclear stress test on 01/03/2012 showed moderate breast attenuation. No significant ischemia identified. Sensitivity and specificity of study reduced by noted attenuation. Normal LV ejection fraction. EF 66%. Overall low risk nuclear stress test. Based on this study, Dr. Anne Fu felt patient may proceed with surgery with risk of MI less than 2%.  Of note, Dr. Myra Gianotti has rescheduled this procedure for 02/22/2012 to allow more time for patient's groin candidiasis to clear.

## 2012-01-10 DIAGNOSIS — I70219 Atherosclerosis of native arteries of extremities with intermittent claudication, unspecified extremity: Secondary | ICD-10-CM | POA: Diagnosis not present

## 2012-01-10 DIAGNOSIS — E119 Type 2 diabetes mellitus without complications: Secondary | ICD-10-CM | POA: Diagnosis not present

## 2012-01-10 DIAGNOSIS — Z6841 Body Mass Index (BMI) 40.0 and over, adult: Secondary | ICD-10-CM | POA: Diagnosis not present

## 2012-01-10 DIAGNOSIS — I743 Embolism and thrombosis of arteries of the lower extremities: Secondary | ICD-10-CM | POA: Diagnosis not present

## 2012-01-16 DIAGNOSIS — I10 Essential (primary) hypertension: Secondary | ICD-10-CM | POA: Diagnosis not present

## 2012-01-16 DIAGNOSIS — I739 Peripheral vascular disease, unspecified: Secondary | ICD-10-CM | POA: Diagnosis not present

## 2012-01-16 DIAGNOSIS — E1129 Type 2 diabetes mellitus with other diabetic kidney complication: Secondary | ICD-10-CM | POA: Diagnosis not present

## 2012-01-16 DIAGNOSIS — E669 Obesity, unspecified: Secondary | ICD-10-CM | POA: Diagnosis not present

## 2012-01-16 DIAGNOSIS — IMO0002 Reserved for concepts with insufficient information to code with codable children: Secondary | ICD-10-CM | POA: Diagnosis not present

## 2012-01-16 DIAGNOSIS — F329 Major depressive disorder, single episode, unspecified: Secondary | ICD-10-CM | POA: Diagnosis not present

## 2012-01-16 DIAGNOSIS — J449 Chronic obstructive pulmonary disease, unspecified: Secondary | ICD-10-CM | POA: Diagnosis not present

## 2012-01-16 DIAGNOSIS — E1165 Type 2 diabetes mellitus with hyperglycemia: Secondary | ICD-10-CM | POA: Diagnosis not present

## 2012-02-01 MED ORDER — MORPHINE SULFATE 2 MG/ML IJ SOLN
INTRAMUSCULAR | Status: AC
  Filled 2012-02-01: qty 1

## 2012-02-06 ENCOUNTER — Ambulatory Visit (INDEPENDENT_AMBULATORY_CARE_PROVIDER_SITE_OTHER): Payer: Medicare Other | Admitting: Internal Medicine

## 2012-02-06 DIAGNOSIS — R0602 Shortness of breath: Secondary | ICD-10-CM | POA: Diagnosis not present

## 2012-02-06 LAB — PULMONARY FUNCTION TEST

## 2012-02-06 NOTE — Progress Notes (Signed)
PFT done today. 

## 2012-02-12 ENCOUNTER — Encounter (HOSPITAL_COMMUNITY): Payer: Self-pay

## 2012-02-12 ENCOUNTER — Encounter (HOSPITAL_COMMUNITY)
Admission: RE | Admit: 2012-02-12 | Discharge: 2012-02-12 | Disposition: A | Discharge status: Home / Self Care | Payer: Medicare Other | Source: Ambulatory Visit | Attending: Surgery | Admitting: Surgery

## 2012-02-12 ENCOUNTER — Other Ambulatory Visit: Payer: Self-pay | Admitting: *Deleted

## 2012-02-12 LAB — URINE MICROSCOPIC-ADD ON

## 2012-02-12 LAB — COMPREHENSIVE METABOLIC PANEL
AST: 18 U/L (ref 0–37)
Albumin: 3.7 g/dL (ref 3.5–5.2)
Alkaline Phosphatase: 71 U/L (ref 39–117)
BUN: 16 mg/dL (ref 6–23)
Chloride: 101 mEq/L (ref 96–112)
Potassium: 4.1 mEq/L (ref 3.5–5.1)
Sodium: 140 mEq/L (ref 135–145)
Total Protein: 7.6 g/dL (ref 6.0–8.3)

## 2012-02-12 LAB — SURGICAL PCR SCREEN: Staphylococcus aureus: NEGATIVE

## 2012-02-12 LAB — CBC
HCT: 42.9 % (ref 36.0–46.0)
MCHC: 32.9 g/dL (ref 30.0–36.0)
Platelets: 490 10*3/uL — ABNORMAL HIGH (ref 150–400)
RDW: 13.6 % (ref 11.5–15.5)
WBC: 13.1 10*3/uL — ABNORMAL HIGH (ref 4.0–10.5)

## 2012-02-12 LAB — URINALYSIS, ROUTINE W REFLEX MICROSCOPIC
Glucose, UA: 100 mg/dL — AB
Hgb urine dipstick: NEGATIVE
Ketones, ur: NEGATIVE mg/dL
pH: 5.5 (ref 5.0–8.0)

## 2012-02-12 LAB — PROTIME-INR
INR: 0.96 (ref 0.00–1.49)
Prothrombin Time: 12.7 seconds (ref 11.6–15.2)

## 2012-02-12 LAB — APTT: aPTT: 35 seconds (ref 24–37)

## 2012-02-12 NOTE — Pre-Procedure Instructions (Addendum)
Stephanie Peck  02/12/2012   Your procedure is scheduled on:  January 24  Report to [facility] at 05:30 AM.  Call this number if you have problems the morning of surgery: [phone]   Remember:   Do not eat food or drink liquids after midnight.   Take these medicines the morning of surgery with A SIP OF WATER: Albuterol, Wellbutrin, Cymbalta, Prevacid, Topamax, Oxycodone (if needed), Promethazine (if needed),      STOP Multiple Vitamin, Iron, Calcium after January 17  Do not wear jewelry, make-up or nail polish.  Do not wear lotions, powders, or perfumes. You may wear deodorant.  Do not shave 48 hours prior to surgery. Men may shave face and neck.  Do not bring valuables to the hospital.  Contacts, dentures or bridgework may not be worn into surgery.  Leave suitcase in the car. After surgery it may be brought to your room.  For patients admitted to the hospital, checkout time is 11:00 AM the day of discharge.   Special Instructions: Shower using CHG 2 nights before surgery and the night before surgery.  If you shower the day of surgery use CHG.  Use special wash - you have one bottle of CHG for all showers.  You should use approximately 1/3 of the bottle for each shower.   Please read over the following fact sheets that you were given: Pain Booklet, Coughing and Deep Breathing, Blood Transfusion Information and Surgical Site Infection Prevention

## 2012-02-13 LAB — URINE CULTURE

## 2012-02-21 MED ORDER — DEXTROSE 5 % IV SOLN
1.5000 g | INTRAVENOUS | Status: AC
  Administered 2012-02-22: 1.5 g via INTRAVENOUS
  Filled 2012-02-21: qty 1.5

## 2012-02-22 ENCOUNTER — Encounter (HOSPITAL_COMMUNITY): Payer: Self-pay | Admitting: *Deleted

## 2012-02-22 ENCOUNTER — Encounter (HOSPITAL_COMMUNITY)
Admission: RE | Disposition: A | Discharge status: Home / Self Care | Payer: Self-pay | Source: Ambulatory Visit | Attending: Surgery

## 2012-02-22 ENCOUNTER — Inpatient Hospital Stay (HOSPITAL_COMMUNITY)
Admission: RE | Admit: 2012-02-22 | Discharge: 2012-02-23 | DRG: 253 | Disposition: A | Discharge status: Home / Self Care | Payer: Medicare Other | Source: Ambulatory Visit | Attending: Surgery | Admitting: Surgery

## 2012-02-22 ENCOUNTER — Inpatient Hospital Stay (HOSPITAL_COMMUNITY): Payer: Medicare Other | Admitting: Vascular Surgery

## 2012-02-22 ENCOUNTER — Inpatient Hospital Stay (HOSPITAL_COMMUNITY): Payer: Medicare Other

## 2012-02-22 ENCOUNTER — Encounter (HOSPITAL_COMMUNITY): Payer: Self-pay | Admitting: Vascular Surgery

## 2012-02-22 DIAGNOSIS — Z87891 Personal history of nicotine dependence: Secondary | ICD-10-CM

## 2012-02-22 DIAGNOSIS — J449 Chronic obstructive pulmonary disease, unspecified: Secondary | ICD-10-CM | POA: Diagnosis present

## 2012-02-22 DIAGNOSIS — G4730 Sleep apnea, unspecified: Secondary | ICD-10-CM | POA: Diagnosis present

## 2012-02-22 DIAGNOSIS — J4489 Other specified chronic obstructive pulmonary disease: Secondary | ICD-10-CM | POA: Diagnosis present

## 2012-02-22 DIAGNOSIS — Z79899 Other long term (current) drug therapy: Secondary | ICD-10-CM | POA: Diagnosis not present

## 2012-02-22 DIAGNOSIS — Z9884 Bariatric surgery status: Secondary | ICD-10-CM | POA: Diagnosis not present

## 2012-02-22 DIAGNOSIS — F329 Major depressive disorder, single episode, unspecified: Secondary | ICD-10-CM | POA: Diagnosis present

## 2012-02-22 DIAGNOSIS — F411 Generalized anxiety disorder: Secondary | ICD-10-CM | POA: Diagnosis present

## 2012-02-22 DIAGNOSIS — R0989 Other specified symptoms and signs involving the circulatory and respiratory systems: Secondary | ICD-10-CM | POA: Diagnosis not present

## 2012-02-22 DIAGNOSIS — I10 Essential (primary) hypertension: Secondary | ICD-10-CM | POA: Diagnosis present

## 2012-02-22 DIAGNOSIS — Z01812 Encounter for preprocedural laboratory examination: Secondary | ICD-10-CM | POA: Diagnosis not present

## 2012-02-22 DIAGNOSIS — I517 Cardiomegaly: Secondary | ICD-10-CM | POA: Diagnosis not present

## 2012-02-22 DIAGNOSIS — I509 Heart failure, unspecified: Secondary | ICD-10-CM | POA: Diagnosis present

## 2012-02-22 DIAGNOSIS — K219 Gastro-esophageal reflux disease without esophagitis: Secondary | ICD-10-CM | POA: Diagnosis present

## 2012-02-22 DIAGNOSIS — Z23 Encounter for immunization: Secondary | ICD-10-CM | POA: Diagnosis not present

## 2012-02-22 DIAGNOSIS — I743 Embolism and thrombosis of arteries of the lower extremities: Secondary | ICD-10-CM | POA: Diagnosis present

## 2012-02-22 DIAGNOSIS — E119 Type 2 diabetes mellitus without complications: Secondary | ICD-10-CM | POA: Diagnosis present

## 2012-02-22 DIAGNOSIS — M79609 Pain in unspecified limb: Secondary | ICD-10-CM | POA: Diagnosis not present

## 2012-02-22 DIAGNOSIS — I70219 Atherosclerosis of native arteries of extremities with intermittent claudication, unspecified extremity: Secondary | ICD-10-CM | POA: Diagnosis not present

## 2012-02-22 DIAGNOSIS — I82819 Embolism and thrombosis of superficial veins of unspecified lower extremities: Secondary | ICD-10-CM | POA: Diagnosis not present

## 2012-02-22 DIAGNOSIS — F3289 Other specified depressive episodes: Secondary | ICD-10-CM | POA: Diagnosis present

## 2012-02-22 DIAGNOSIS — Z6841 Body Mass Index (BMI) 40.0 and over, adult: Secondary | ICD-10-CM | POA: Diagnosis not present

## 2012-02-22 DIAGNOSIS — I739 Peripheral vascular disease, unspecified: Secondary | ICD-10-CM | POA: Diagnosis not present

## 2012-02-22 DIAGNOSIS — J9819 Other pulmonary collapse: Secondary | ICD-10-CM | POA: Diagnosis not present

## 2012-02-22 HISTORY — PX: ENDARTERECTOMY FEMORAL: SHX5804

## 2012-02-22 HISTORY — PX: APPLICATION OF WOUND VAC: SHX5189

## 2012-02-22 HISTORY — PX: PATCH ANGIOPLASTY: SHX6230

## 2012-02-22 LAB — GLUCOSE, CAPILLARY
Glucose-Capillary: 182 mg/dL — ABNORMAL HIGH (ref 70–99)
Glucose-Capillary: 265 mg/dL — ABNORMAL HIGH (ref 70–99)
Glucose-Capillary: 183 mg/dL — ABNORMAL HIGH (ref 70–99)

## 2012-02-22 LAB — BLOOD GAS, ARTERIAL
Drawn by: 331001
Expiratory PAP: 8
Inspiratory PAP: 8
pCO2 arterial: 44.8 mmHg (ref 35.0–45.0)
pH, Arterial: 7.331 — ABNORMAL LOW (ref 7.350–7.450)
pO2, Arterial: 65.2 mmHg — ABNORMAL LOW (ref 80.0–100.0)

## 2012-02-22 SURGERY — ENDARTERECTOMY, FEMORAL
Anesthesia: General | Site: Leg Upper | Laterality: Left | Wound class: Clean

## 2012-02-22 MED ORDER — ALUM & MAG HYDROXIDE-SIMETH 200-200-20 MG/5ML PO SUSP: 15.0000 mL | ORAL | Status: DC | PRN

## 2012-02-22 MED ORDER — METFORMIN HCL 500 MG PO TABS
500.0000 mg | ORAL_TABLET | Freq: Two times a day (BID) | ORAL | Status: DC
  Administered 2012-02-22 – 2012-02-23 (×2): 500 mg via ORAL
  Filled 2012-02-22 (×4): qty 1

## 2012-02-22 MED ORDER — METOPROLOL TARTRATE 1 MG/ML IV SOLN
2.0000 mg | INTRAVENOUS | Status: DC | PRN
Start: 2012-02-22 — End: 2012-02-23

## 2012-02-22 MED ORDER — CALCIUM CARBONATE 1250 (500 CA) MG PO TABS
1.0000 | ORAL_TABLET | Freq: Two times a day (BID) | ORAL | Status: DC
  Administered 2012-02-22 – 2012-02-23 (×2): 500 mg via ORAL
  Filled 2012-02-22 (×6): qty 1

## 2012-02-22 MED ORDER — IRBESARTAN 300 MG PO TABS
300.0000 mg | ORAL_TABLET | Freq: Every day | ORAL | Status: DC
  Administered 2012-02-22 – 2012-02-23 (×2): 300 mg via ORAL
  Filled 2012-02-22 (×2): qty 1

## 2012-02-22 MED ORDER — LABETALOL HCL 5 MG/ML IV SOLN
INTRAVENOUS | Status: DC | PRN
  Administered 2012-02-22 (×2): 5 mg via INTRAVENOUS

## 2012-02-22 MED ORDER — PROTAMINE SULFATE 10 MG/ML IV SOLN
INTRAVENOUS | Status: DC | PRN
  Administered 2012-02-22: 50 mg via INTRAVENOUS

## 2012-02-22 MED ORDER — SUMATRIPTAN SUCCINATE 25 MG PO TABS
25.0000 mg | ORAL_TABLET | ORAL | Status: DC | PRN
  Filled 2012-02-22: qty 1

## 2012-02-22 MED ORDER — MIDAZOLAM HCL 5 MG/5ML IJ SOLN
INTRAMUSCULAR | Status: DC | PRN
  Administered 2012-02-22: 2 mg via INTRAVENOUS

## 2012-02-22 MED ORDER — SITAGLIPTIN-METFORMIN HCL 50-1000 MG PO TABS
1.0000 | ORAL_TABLET | Freq: Two times a day (BID) | ORAL | Status: DC
Start: 2012-02-22 — End: 2012-02-22

## 2012-02-22 MED ORDER — GUAIFENESIN-DM 100-10 MG/5ML PO SYRP: 15.0000 mL | ORAL_SOLUTION | ORAL | Status: DC | PRN

## 2012-02-22 MED ORDER — MAGNESIUM SULFATE 40 MG/ML IJ SOLN
2.0000 g | Freq: Once | INTRAMUSCULAR | Status: AC | PRN
  Filled 2012-02-22: qty 50

## 2012-02-22 MED ORDER — LACTATED RINGERS IV SOLN
INTRAVENOUS | Status: DC | PRN
  Administered 2012-02-22 (×3): via INTRAVENOUS

## 2012-02-22 MED ORDER — 0.9 % SODIUM CHLORIDE (POUR BTL) OPTIME
TOPICAL | Status: DC | PRN
  Administered 2012-02-22: 2000 mL

## 2012-02-22 MED ORDER — CALCIUM CARBONATE 600 MG PO TABS
600.0000 mg | ORAL_TABLET | Freq: Two times a day (BID) | ORAL | Status: DC
  Filled 2012-02-22 (×2): qty 1

## 2012-02-22 MED ORDER — SIMVASTATIN 20 MG PO TABS
20.0000 mg | ORAL_TABLET | Freq: Every evening | ORAL | Status: DC
  Administered 2012-02-22: 20 mg via ORAL
  Filled 2012-02-22 (×2): qty 1

## 2012-02-22 MED ORDER — FENTANYL CITRATE 0.05 MG/ML IJ SOLN
INTRAMUSCULAR | Status: DC | PRN
  Administered 2012-02-22: 100 ug via INTRAVENOUS
  Administered 2012-02-22: 150 ug via INTRAVENOUS

## 2012-02-22 MED ORDER — MORPHINE SULFATE 2 MG/ML IJ SOLN
2.0000 mg | INTRAMUSCULAR | Status: DC | PRN
  Administered 2012-02-22: 2 mg via INTRAVENOUS
  Filled 2012-02-22: qty 1

## 2012-02-22 MED ORDER — FUROSEMIDE 80 MG PO TABS
80.0000 mg | ORAL_TABLET | Freq: Every day | ORAL | Status: DC
  Filled 2012-02-22: qty 1

## 2012-02-22 MED ORDER — DEXTROSE 5 % IV SOLN: 1.5000 g | INTRAVENOUS | Status: DC

## 2012-02-22 MED ORDER — SODIUM CHLORIDE 0.9 % IR SOLN
Status: DC | PRN
  Administered 2012-02-22: 09:00:00

## 2012-02-22 MED ORDER — PHENYLEPHRINE HCL 10 MG/ML IJ SOLN
10.0000 mg | INTRAVENOUS | Status: DC | PRN
  Administered 2012-02-22: 10 ug/min via INTRAVENOUS

## 2012-02-22 MED ORDER — NEOSTIGMINE METHYLSULFATE 1 MG/ML IJ SOLN
INTRAMUSCULAR | Status: DC | PRN
  Administered 2012-02-22: 4 mg via INTRAVENOUS

## 2012-02-22 MED ORDER — SENNOSIDES-DOCUSATE SODIUM 8.6-50 MG PO TABS
1.0000 | ORAL_TABLET | Freq: Every evening | ORAL | Status: DC | PRN
  Filled 2012-02-22: qty 1

## 2012-02-22 MED ORDER — CLOPIDOGREL BISULFATE 75 MG PO TABS
75.0000 mg | ORAL_TABLET | Freq: Every day | ORAL | Status: DC
  Administered 2012-02-23: 75 mg via ORAL
  Filled 2012-02-22 (×2): qty 1

## 2012-02-22 MED ORDER — PNEUMOCOCCAL VAC POLYVALENT 25 MCG/0.5ML IJ INJ
0.5000 mL | INJECTION | INTRAMUSCULAR | Status: AC
  Administered 2012-02-23: 0.5 mL via INTRAMUSCULAR
  Filled 2012-02-22: qty 0.5

## 2012-02-22 MED ORDER — HEPARIN SODIUM (PORCINE) 1000 UNIT/ML IJ SOLN
INTRAMUSCULAR | Status: DC | PRN
  Administered 2012-02-22: 9000 [IU] via INTRAVENOUS
  Administered 2012-02-22: 1000 [IU] via INTRAVENOUS

## 2012-02-22 MED ORDER — OXYCODONE HCL 5 MG PO TABS: 5.0000 mg | ORAL_TABLET | Freq: Once | ORAL | Status: DC | PRN

## 2012-02-22 MED ORDER — GLYCOPYRROLATE 0.2 MG/ML IJ SOLN
INTRAMUSCULAR | Status: DC | PRN
  Administered 2012-02-22: 0.6 mg via INTRAVENOUS

## 2012-02-22 MED ORDER — OXYCODONE-ACETAMINOPHEN 5-325 MG PO TABS
1.0000 | ORAL_TABLET | ORAL | Status: DC | PRN
  Administered 2012-02-22 – 2012-02-23 (×2): 2 via ORAL
  Filled 2012-02-22 (×2): qty 2

## 2012-02-22 MED ORDER — LIDOCAINE HCL (CARDIAC) 20 MG/ML IV SOLN
INTRAVENOUS | Status: DC | PRN
  Administered 2012-02-22: 60 mg via INTRAVENOUS

## 2012-02-22 MED ORDER — GABAPENTIN 600 MG PO TABS
600.0000 mg | ORAL_TABLET | Freq: Two times a day (BID) | ORAL | Status: DC
  Administered 2012-02-22 – 2012-02-23 (×2): 600 mg via ORAL
  Filled 2012-02-22 (×3): qty 1

## 2012-02-22 MED ORDER — SODIUM CHLORIDE 0.9 % IV SOLN: INTRAVENOUS | Status: DC

## 2012-02-22 MED ORDER — POTASSIUM CHLORIDE CRYS ER 20 MEQ PO TBCR: 20.0000 meq | EXTENDED_RELEASE_TABLET | Freq: Once | ORAL | Status: AC | PRN

## 2012-02-22 MED ORDER — HYDROMORPHONE HCL PF 1 MG/ML IJ SOLN: 0.2500 mg | INTRAMUSCULAR | Status: DC | PRN

## 2012-02-22 MED ORDER — TELMISARTAN-HCTZ 80-25 MG PO TABS
1.0000 | ORAL_TABLET | Freq: Every day | ORAL | Status: DC
Start: 2012-02-22 — End: 2012-02-22

## 2012-02-22 MED ORDER — SODIUM CHLORIDE 0.9 % IV SOLN
INTRAVENOUS | Status: DC
  Administered 2012-02-22: 125 mL/h via INTRAVENOUS
  Administered 2012-02-23: 01:00:00 via INTRAVENOUS

## 2012-02-22 MED ORDER — BUPROPION HCL ER (XL) 300 MG PO TB24
300.0000 mg | ORAL_TABLET | Freq: Every day | ORAL | Status: DC
  Administered 2012-02-23: 300 mg via ORAL
  Filled 2012-02-22: qty 1

## 2012-02-22 MED ORDER — ONDANSETRON HCL 4 MG/2ML IJ SOLN
INTRAMUSCULAR | Status: DC | PRN
  Administered 2012-02-22 (×2): 4 mg via INTRAVENOUS

## 2012-02-22 MED ORDER — ONDANSETRON HCL 4 MG/2ML IJ SOLN: 4.0000 mg | Freq: Four times a day (QID) | INTRAMUSCULAR | Status: DC | PRN

## 2012-02-22 MED ORDER — FENTANYL CITRATE 0.05 MG/ML IJ SOLN: INTRAMUSCULAR | Status: DC | PRN

## 2012-02-22 MED ORDER — SODIUM CHLORIDE 0.9 % IV SOLN: 500.0000 mL | Freq: Once | INTRAVENOUS | Status: AC | PRN

## 2012-02-22 MED ORDER — PANTOPRAZOLE SODIUM 40 MG PO TBEC
40.0000 mg | DELAYED_RELEASE_TABLET | Freq: Every day | ORAL | Status: DC
  Administered 2012-02-23: 40 mg via ORAL
  Filled 2012-02-22: qty 1

## 2012-02-22 MED ORDER — GLIPIZIDE 5 MG PO TABS
5.0000 mg | ORAL_TABLET | Freq: Two times a day (BID) | ORAL | Status: DC
  Administered 2012-02-22 – 2012-02-23 (×2): 5 mg via ORAL
  Filled 2012-02-22 (×4): qty 1

## 2012-02-22 MED ORDER — ACETAMINOPHEN 650 MG RE SUPP: 325.0000 mg | RECTAL | Status: DC | PRN

## 2012-02-22 MED ORDER — HYDROCHLOROTHIAZIDE 25 MG PO TABS
25.0000 mg | ORAL_TABLET | Freq: Every day | ORAL | Status: DC
  Administered 2012-02-22 – 2012-02-23 (×2): 25 mg via ORAL
  Filled 2012-02-22 (×3): qty 1

## 2012-02-22 MED ORDER — PROPOFOL 10 MG/ML IV BOLUS
INTRAVENOUS | Status: DC | PRN
  Administered 2012-02-22: 50 mg via INTRAVENOUS
  Administered 2012-02-22: 150 mg via INTRAVENOUS

## 2012-02-22 MED ORDER — TOPIRAMATE 25 MG PO TABS
50.0000 mg | ORAL_TABLET | Freq: Two times a day (BID) | ORAL | Status: DC
  Administered 2012-02-22 – 2012-02-23 (×2): 50 mg via ORAL
  Filled 2012-02-22 (×3): qty 2

## 2012-02-22 MED ORDER — ALBUMIN HUMAN 5 % IV SOLN
INTRAVENOUS | Status: DC | PRN
  Administered 2012-02-22: 09:00:00 via INTRAVENOUS

## 2012-02-22 MED ORDER — LABETALOL HCL 5 MG/ML IV SOLN: 10.0000 mg | INTRAVENOUS | Status: DC | PRN

## 2012-02-22 MED ORDER — ALBUTEROL SULFATE (5 MG/ML) 0.5% IN NEBU: 2.5000 mg | INHALATION_SOLUTION | Freq: Four times a day (QID) | RESPIRATORY_TRACT | Status: DC | PRN

## 2012-02-22 MED ORDER — FERROUS FUMARATE 325 (106 FE) MG PO TABS
1.0000 | ORAL_TABLET | Freq: Every day | ORAL | Status: DC
  Filled 2012-02-22 (×2): qty 1

## 2012-02-22 MED ORDER — DULOXETINE HCL 60 MG PO CPEP
60.0000 mg | ORAL_CAPSULE | Freq: Every day | ORAL | Status: DC
  Administered 2012-02-23: 60 mg via ORAL
  Filled 2012-02-22: qty 1

## 2012-02-22 MED ORDER — ARTIFICIAL TEARS OP OINT
TOPICAL_OINTMENT | OPHTHALMIC | Status: DC | PRN
  Administered 2012-02-22: 1 via OPHTHALMIC

## 2012-02-22 MED ORDER — SCOPOLAMINE 1 MG/3DAYS TD PT72
1.0000 | MEDICATED_PATCH | TRANSDERMAL | Status: DC
  Administered 2012-02-22: 1 via TRANSDERMAL
  Filled 2012-02-22: qty 1

## 2012-02-22 MED ORDER — SCOPOLAMINE 1 MG/3DAYS TD PT72
MEDICATED_PATCH | TRANSDERMAL | Status: AC
  Filled 2012-02-22: qty 1

## 2012-02-22 MED ORDER — PANTOPRAZOLE SODIUM 40 MG PO TBEC: 40.0000 mg | DELAYED_RELEASE_TABLET | Freq: Every day | ORAL | Status: DC

## 2012-02-22 MED ORDER — HYDRALAZINE HCL 20 MG/ML IJ SOLN: 10.0000 mg | INTRAMUSCULAR | Status: DC | PRN

## 2012-02-22 MED ORDER — BISACODYL 10 MG RE SUPP: 10.0000 mg | Freq: Every day | RECTAL | Status: DC | PRN

## 2012-02-22 MED ORDER — INSULIN ASPART 100 UNIT/ML ~~LOC~~ SOLN
0.0000 [IU] | SUBCUTANEOUS | Status: DC
  Administered 2012-02-22 – 2012-02-23 (×3): 4 [IU] via SUBCUTANEOUS

## 2012-02-22 MED ORDER — ACETAMINOPHEN 325 MG PO TABS: 325.0000 mg | ORAL_TABLET | ORAL | Status: DC | PRN

## 2012-02-22 MED ORDER — OXYCODONE HCL 5 MG/5ML PO SOLN: 5.0000 mg | Freq: Once | ORAL | Status: DC | PRN

## 2012-02-22 MED ORDER — DEXTROSE 5 % IV SOLN
1.5000 g | Freq: Two times a day (BID) | INTRAVENOUS | Status: AC
  Administered 2012-02-22 – 2012-02-23 (×2): 1.5 g via INTRAVENOUS
  Filled 2012-02-22 (×2): qty 1.5

## 2012-02-22 MED ORDER — ROCURONIUM BROMIDE 100 MG/10ML IV SOLN
INTRAVENOUS | Status: DC | PRN
  Administered 2012-02-22: 50 mg via INTRAVENOUS
  Administered 2012-02-22: 20 mg via INTRAVENOUS
  Administered 2012-02-22: 10 mg via INTRAVENOUS
  Administered 2012-02-22: 20 mg via INTRAVENOUS

## 2012-02-22 MED ORDER — PHENOL 1.4 % MT LIQD: 1.0000 | OROMUCOSAL | Status: DC | PRN

## 2012-02-22 MED ORDER — DOPAMINE-DEXTROSE 3.2-5 MG/ML-% IV SOLN: 3.0000 ug/kg/min | INTRAVENOUS | Status: DC

## 2012-02-22 MED ORDER — DOCUSATE SODIUM 100 MG PO CAPS
100.0000 mg | ORAL_CAPSULE | Freq: Every day | ORAL | Status: DC
  Administered 2012-02-23: 100 mg via ORAL
  Filled 2012-02-22: qty 1

## 2012-02-22 MED ORDER — ALBUTEROL SULFATE HFA 108 (90 BASE) MCG/ACT IN AERS: 2.0000 | INHALATION_SPRAY | Freq: Four times a day (QID) | RESPIRATORY_TRACT | Status: DC | PRN

## 2012-02-22 MED ORDER — TOPIRAMATE 25 MG PO TABS: 50.0000 mg | ORAL_TABLET | Freq: Two times a day (BID) | ORAL | Status: DC

## 2012-02-22 MED ORDER — LINAGLIPTIN 5 MG PO TABS
5.0000 mg | ORAL_TABLET | Freq: Two times a day (BID) | ORAL | Status: DC
  Administered 2012-02-22 – 2012-02-23 (×2): 5 mg via ORAL
  Filled 2012-02-22 (×4): qty 1

## 2012-02-22 MED ORDER — PHENYLEPHRINE HCL 10 MG/ML IJ SOLN
INTRAMUSCULAR | Status: DC | PRN
  Administered 2012-02-22 (×3): 80 ug via INTRAVENOUS
  Administered 2012-02-22: 160 ug via INTRAVENOUS
  Administered 2012-02-22: 80 ug via INTRAVENOUS
  Administered 2012-02-22 (×2): 160 ug via INTRAVENOUS

## 2012-02-22 SURGICAL SUPPLY — 59 items
ADH SKN CLS APL DERMABOND .7 (GAUZE/BANDAGES/DRESSINGS) ×2
BANDAGE ELASTIC 4 VELCRO ST LF (GAUZE/BANDAGES/DRESSINGS) IMPLANT
CANISTER SUCTION 2500CC (MISCELLANEOUS) ×3 IMPLANT
CANISTER WOUND CARE 500ML ATS (WOUND CARE) ×1 IMPLANT
CATH EMB 5FR 80CM (CATHETERS) ×1 IMPLANT
CLIP TI MEDIUM 24 (CLIP) ×3 IMPLANT
CLIP TI WIDE RED SMALL 24 (CLIP) ×3 IMPLANT
CLOTH BEACON ORANGE TIMEOUT ST (SAFETY) ×3 IMPLANT
COVER SURGICAL LIGHT HANDLE (MISCELLANEOUS) ×3 IMPLANT
DERMABOND ADVANCED (GAUZE/BANDAGES/DRESSINGS) ×1
DERMABOND ADVANCED .7 DNX12 (GAUZE/BANDAGES/DRESSINGS) ×2 IMPLANT
DRAIN CHANNEL 15F RND FF W/TCR (WOUND CARE) IMPLANT
DRAPE WARM FLUID 44X44 (DRAPE) ×3 IMPLANT
DRAPE X-RAY CASS 24X20 (DRAPES) IMPLANT
DRSG ADAPTIC 3X8 NADH LF (GAUZE/BANDAGES/DRESSINGS) ×2 IMPLANT
DRSG COVADERM 4X10 (GAUZE/BANDAGES/DRESSINGS) IMPLANT
DRSG COVADERM 4X8 (GAUZE/BANDAGES/DRESSINGS) IMPLANT
DRSG VAC ATS SM SENSATRAC (GAUZE/BANDAGES/DRESSINGS) ×2 IMPLANT
ELECT REM PT RETURN 9FT ADLT (ELECTROSURGICAL) ×3
ELECTRODE REM PT RTRN 9FT ADLT (ELECTROSURGICAL) ×2 IMPLANT
EVACUATOR SILICONE 100CC (DRAIN) IMPLANT
GLOVE BIOGEL PI IND STRL 6.5 (GLOVE) IMPLANT
GLOVE BIOGEL PI IND STRL 7.5 (GLOVE) ×2 IMPLANT
GLOVE BIOGEL PI INDICATOR 6.5 (GLOVE) ×4
GLOVE BIOGEL PI INDICATOR 7.5 (GLOVE) ×2
GLOVE ECLIPSE 6.5 STRL STRAW (GLOVE) ×2 IMPLANT
GLOVE ECLIPSE 7.0 STRL STRAW (GLOVE) ×1 IMPLANT
GLOVE ECLIPSE 7.5 STRL STRAW (GLOVE) ×1 IMPLANT
GLOVE SURG SS PI 7.5 STRL IVOR (GLOVE) ×3 IMPLANT
GOWN PREVENTION PLUS XLARGE (GOWN DISPOSABLE) ×1 IMPLANT
GOWN PREVENTION PLUS XXLARGE (GOWN DISPOSABLE) ×3 IMPLANT
GOWN STRL NON-REIN LRG LVL3 (GOWN DISPOSABLE) ×7 IMPLANT
HEMOSTAT SNOW SURGICEL 2X4 (HEMOSTASIS) ×1 IMPLANT
HEMOSTAT SURGICEL 2X14 (HEMOSTASIS) IMPLANT
KIT BASIN OR (CUSTOM PROCEDURE TRAY) ×3 IMPLANT
KIT ROOM TURNOVER OR (KITS) ×3 IMPLANT
NS IRRIG 1000ML POUR BTL (IV SOLUTION) ×6 IMPLANT
PACK PERIPHERAL VASCULAR (CUSTOM PROCEDURE TRAY) ×3 IMPLANT
PAD ARMBOARD 7.5X6 YLW CONV (MISCELLANEOUS) ×6 IMPLANT
PATCH VASCULAR VASCU GUARD 1X6 (Vascular Products) ×1 IMPLANT
SET COLLECT BLD 21X3/4 12 (NEEDLE) IMPLANT
SPONGE INTESTINAL PEANUT (DISPOSABLE) ×3 IMPLANT
STAPLER VISISTAT 35W (STAPLE) IMPLANT
STOPCOCK 4 WAY LG BORE MALE ST (IV SETS) ×1 IMPLANT
SUT ETHILON 3 0 PS 1 (SUTURE) IMPLANT
SUT PROLENE 5 0 C 1 24 (SUTURE) ×2 IMPLANT
SUT PROLENE 6 0 BV (SUTURE) ×3 IMPLANT
SUT VIC AB 2-0 CT1 27 (SUTURE) ×9
SUT VIC AB 2-0 CT1 TAPERPNT 27 (SUTURE) ×2 IMPLANT
SUT VIC AB 3-0 SH 27 (SUTURE) ×3
SUT VIC AB 3-0 SH 27X BRD (SUTURE) ×2 IMPLANT
SUT VICRYL 4-0 PS2 18IN ABS (SUTURE) ×1 IMPLANT
SYR 5ML LL (SYRINGE) ×1 IMPLANT
TOWEL OR 17X24 6PK STRL BLUE (TOWEL DISPOSABLE) ×6 IMPLANT
TOWEL OR 17X26 10 PK STRL BLUE (TOWEL DISPOSABLE) ×3 IMPLANT
TRAY FOLEY CATH 14FRSI W/METER (CATHETERS) ×3 IMPLANT
TUBING EXTENTION W/L.L. (IV SETS) IMPLANT
UNDERPAD 30X30 INCONTINENT (UNDERPADS AND DIAPERS) ×3 IMPLANT
WATER STERILE IRR 1000ML POUR (IV SOLUTION) ×3 IMPLANT

## 2012-02-22 NOTE — Anesthesia Preprocedure Evaluation (Signed)
Anesthesia Evaluation  Patient identified by MRN, date of birth, ID band Patient awake    Reviewed: Allergy & Precautions, H&P , NPO status , Patient's Chart, lab work & pertinent test results  Airway Mallampati: II TM Distance: >3 FB Neck ROM: Full    Dental No notable dental hx. (+) Edentulous Upper, Edentulous Lower and Dental Advisory Given   Pulmonary sleep apnea and Continuous Positive Airway Pressure Ventilation , COPD breath sounds clear to auscultation  Pulmonary exam normal       Cardiovascular hypertension, On Medications + Peripheral Vascular Disease and +CHF Rhythm:Regular Rate:Normal     Neuro/Psych PSYCHIATRIC DISORDERS negative neurological ROS     GI/Hepatic Neg liver ROS, GERD-  Medicated and Controlled,  Endo/Other  diabetes, Type 2, Oral Hypoglycemic AgentsMorbid obesity  Renal/GU negative Renal ROS  negative genitourinary   Musculoskeletal   Abdominal   Peds  Hematology negative hematology ROS (+)   Anesthesia Other Findings   Reproductive/Obstetrics negative OB ROS                           Anesthesia Physical Anesthesia Plan  ASA: III  Anesthesia Plan: General   Post-op Pain Management:    Induction: Intravenous  Airway Management Planned: Oral ETT  Additional Equipment:   Intra-op Plan:   Post-operative Plan: Extubation in OR  Informed Consent: I have reviewed the patients History and Physical, chart, labs and discussed the procedure including the risks, benefits and alternatives for the proposed anesthesia with the patient or authorized representative who has indicated his/her understanding and acceptance.   Dental advisory given  Plan Discussed with: CRNA  Anesthesia Plan Comments:         Anesthesia Quick Evaluation

## 2012-02-22 NOTE — Transfer of Care (Signed)
Immediate Anesthesia Transfer of Care Note  Patient: Stephanie Peck  Procedure(s) Performed: Procedure(s) (LRB) with comments: ENDARTERECTOMY FEMORAL (Left) PATCH ANGIOPLASTY (Left) - left femoral patch angioplasty APPLICATION OF WOUND VAC (Left) - application wound vac  Patient Location: PACU  Anesthesia Type:General  Level of Consciousness: awake, alert , oriented and sedated  Airway & Oxygen Therapy: Patient Spontanous Breathing and Patient connected to face mask oxygen  Post-op Assessment: Report given to PACU RN, Post -op Vital signs reviewed and stable and Patient moving all extremities  Post vital signs: Reviewed and stable  Complications: No apparent anesthesia complications

## 2012-02-22 NOTE — Progress Notes (Signed)
Admitted from PACU, no complaints. Patient drowsy but oriented.  Only able to doppler Dorsalis Pedis pulse with good doppler. Incisional vac intact at 136mm/hg, no air leak.  Continue to watch.

## 2012-02-22 NOTE — Anesthesia Postprocedure Evaluation (Signed)
  Anesthesia Post-op Note  Patient: Stephanie Peck  Procedure(s) Performed: Procedure(s) (LRB) with comments: ENDARTERECTOMY FEMORAL (Left) PATCH ANGIOPLASTY (Left) - left femoral patch angioplasty APPLICATION OF WOUND VAC (Left) - application wound vac  Patient Location: PACU  Anesthesia Type:General  Level of Consciousness: awake and alert   Airway and Oxygen Therapy: Patient Spontanous Breathing and Patient connected to face mask oxygen  Post-op Pain: none  Post-op Assessment: Post-op Vital signs reviewed, Patient's Cardiovascular Status Stable, Respiratory Function Stable, Patent Airway and No signs of Nausea or vomiting  Post-op Vital Signs: Reviewed and stable  Complications: No apparent anesthesia complications

## 2012-02-22 NOTE — Progress Notes (Signed)
Utilization Review Completed.   Leontina Skidmore, RN, BSN Nurse Case Manager  [phone]  

## 2012-02-22 NOTE — Preoperative (Signed)
Beta Blockers   Reason not to administer Beta Blockers:Not Applicable 

## 2012-02-22 NOTE — Op Note (Signed)
[facility] [place]  Patient name: Stephanie Peck MRN: [id number] DOB: 1960-10-31 Sex: female  02/22/2012 Pre-operative Diagnosis: Left leg claudication Post-operative diagnosis:  Same Surgeon:  Jorge Ny Assistants:   Narda Amber Procedure:   Left common femoral, superficial femoral, profundofemoral endarterectomy with patch angioplasty (bovine) Anesthesia:  Gen. Blood Loss:  See anesthesia record Specimens:  Femoral plaque  Findings:  The patient had an what appeared to be in a ruptured plaque in her distal common femoral artery which extended into the origin of the superficial femoral and profunda femoral artery. There was the appearance of old blood creating a significant stenosis.  Indications:  The patient suffers from lifestyle limiting left leg claudication. She had similar symptoms in her right leg which was able to be treated percutaneously. The stenosis in her left leg is at the femoral bifurcation, and therefore she was not a candidate for percutaneous intervention. Because of her body habitus she understands that she is at high risk for wound issues. However, because of her symptoms, she wishes to proceed.  Procedure:  The patient was identified in the holding area and taken to [facility] OR ROOM 12  The patient was then placed supine on the table. general anesthesia was administered.  The patient was prepped and draped in the usual sterile fashion.  A time out was called and antibiotics were administered.  A longitudinal incision was made in the left groin. Cautery was used to divide the subcutaneous tissue. The patient had multiple large venous tributaries which required ligation between silk ties. The femoral sheath was identified and opened sharply. And the distal external iliac artery was exposed. I also dissected out the superficial femoral and profunda femoral arteries. Due to the patient's body habitus the dissection was extremely difficult. I was able to  get access to the external iliac artery under the inguinal ligament. Once full exposure was obtained, the patient was fully heparinized. This was confirmed with an ACT being greater than 230. Next the vessels were occluded with vascular clamps. A #11 blade was used to make an arteriotomy which was extended longitudinally with Potts scissors. The arteriotomy with onto the superficial femoral artery for a distance of approximately 1 cm. 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 This appeared to be associated with a ruptured plaque. An elevator was used to perform endarterectomy. I was able to get a good distal endpoint in the superficial femoral artery as well as in the profunda femoral artery. I reached up with a hemostat to remove residual plaque within the distal external iliac artery. I inserted a #5 Fogarty catheter for proximal control to make sure I was able to evacuate all occlusive material. After these maneuvers excellent inflow was established. Next, a bovine pericardial patch was selected. Patch angioplasty was performed using a running 5-0 Prolene. Prior to completion the appropriate flushing maneuvers were performed and the anastomosis was completed. Hand-held Doppler was used to evaluate the signals in the common femoral, superficial femoral, and profunda femoral artery. They each had an multiphasic signals. Next, the patient's heparin was reversed with 50 mg of protamine. Hemostasis was achieved.  The femoral sheath was reapproximated with 2-0 Vicryl. The subcutaneous tissue was closed with multiple layers of 2-0 Vicryl. Subcutaneous tissue was closed with 3-0 Vicryl and the skin was closed with 4-0 Vicryl. An incisional wound VAC was placed over top of the wound   Disposition:  To PACU in stable condition.  Theotis Burrow, M.D. [facility] [place] Office: [phone] Pager:  [phone]

## 2012-02-22 NOTE — H&P (Signed)
[facility] [place]  Patient name: Stephanie Peck MRN: [id number] DOB: 03/15/1960 Sex: female  Chief Complaint   Patient presents with   .  PVD     discuss left fem-endarterectomy, s/p rt superficial fem sten 12/05/2011    HISTORY OF PRESENT ILLNESS:  The patient is back today for followup. She is status post right superficial femoral artery stenting on October 29. This was done in the setting of bilateral claudication. She has multiple issues for leg pain, and has been dealing with back issues for many years. She states that her pain as of late has been affecting her mobility. She is only able to ambulate approximately 50 feet before her legs give out. For that reason proceeded with her stenting. Today she states she has noticed a dramatic improvement in her right leg. Her left leg still limits her.  Past Medical History   Diagnosis  Date   .  Diabetes mellitus    .  GERD (gastroesophageal reflux disease)    .  Anxiety    .  Depression    .  CHF (congestive heart failure)    .  COPD (chronic obstructive pulmonary disease)     Past Surgical History   Procedure  Date   .  Cholecystectomy    .  Gastric bypass  2006   .  Tonsillectomy    .  Femoral artery stent  12/05/2011     right superficial    History    Social History   .  Marital Status:  Married     Spouse Name:  N/A     Number of Children:  N/A   .  Years of Education:  N/A    Occupational History   .  Not on file.    Social History Main Topics   .  Smoking status:  Former Smoker     Types:  Cigarettes     Start date:  09/30/2011   .  Smokeless tobacco:  Never Used   .  Alcohol Use:  No   .  Drug Use:  No   .  Sexually Active:  Not on file    Other Topics  Concern   .  Not on file    Social History Narrative   .  No narrative on file    Family History   Problem  Relation  Age of Onset   .  Cancer  Mother    .  Depression  Mother    .  Heart disease  Father       before age 55    .   Diabetes  Father    .  Hypertension  Father    .  Hyperlipidemia  Father    .  Heart attack  Father    .  Cancer  Maternal Grandmother     Allergies as of 12/24/2011 - Review Complete 12/24/2011   Allergen  Reaction  Noted   .  Codeine      Current Outpatient Prescriptions on File Prior to Visit   Medication  Sig  Dispense  Refill   .  buPROPion (WELLBUTRIN XL) 300 MG 24 hr tablet  Take 300 mg by mouth daily.     .  calcium carbonate (OS-CAL) 600 MG TABS  Take 600 mg by mouth 2 (two) times daily with a meal.     .  diclofenac sodium (VOLTAREN) 1 % GEL  Apply 1 application topically 4 (four)  times daily.  3 Tube  1   .  DULoxetine (CYMBALTA) 60 MG capsule  Take 60 mg by mouth daily.     .  ferrous fumarate (HEMOCYTE - 106 MG FE) 325 (106 FE) MG TABS  Take 1 tablet by mouth.     .  furosemide (LASIX) 80 MG tablet  Take 80 mg by mouth as needed.     .  gabapentin (NEURONTIN) 600 MG tablet  Take 600 mg by mouth 4 (four) times daily.     Marland Kitchen  glipiZIDE (GLUCOTROL XL) 5 MG 24 hr tablet  Take 5 mg by mouth daily.     .  lansoprazole (PREVACID) 30 MG capsule  Take 30 mg by mouth 2 (two) times daily.     .  Multiple Vitamin (MULTIVITAMIN) tablet  Take 1 tablet by mouth daily.     Marland Kitchen  oxycodone (OXY-IR) 5 MG capsule  Take 5 mg by mouth as needed.     .  promethazine (PHENERGAN) 25 MG tablet  Take 25 mg by mouth as needed.     .  simvastatin (ZOCOR) 20 MG tablet  Take 20 mg by mouth every evening.     .  sitaGLIPtan-metformin (JANUMET) 50-1000 MG per tablet  Take 1 tablet by mouth 2 (two) times daily with a meal.     .  SUMAtriptan (IMITREX) 25 MG tablet  Take 25 mg by mouth as needed.     Marland Kitchen  telmisartan-hydrochlorothiazide (MICARDIS HCT) 80-25 MG per tablet  Take 1 tablet by mouth daily.     Marland Kitchen  topiramate (TOPAMAX) 50 MG tablet  Take 50 mg by mouth 2 (two) times daily.     .  vitamin B-12 (CYANOCOBALAMIN) 250 MCG tablet  Take 250 mcg by mouth daily.      REVIEW OF SYSTEMS:  No changes from prior  visit  PHYSICAL EXAMINATION:  Vital signs are BP 128/67  Pulse 91  Ht 5\' 7"  (1.702 m)  Wt 280 lb (127.007 kg)  BMI 43.85 kg/m2  SpO2 100%  General: The patient appears their stated age.  HEENT: No gross abnormalities  Pulmonary: Non labored breathing  Abdomen: Soft and non-tender  Musculoskeletal: There are no major deformities.  Neurologic: No focal weakness or paresthesias are detected,  Skin: There are no ulcer or rashes noted.  Psychiatric: The patient has normal affect.  Cardiovascular: There is a regular rate and rhythm without significant murmur appreciated.  Diagnostic Studies  None  Assessment:  Bilateral claudication  Plan:  The patient has noticed a significant improvement in her right leg symptoms and is here to pursue left leg revascularization. I discussed with her that because the stenosis is in her left common femoral artery, this would require surgical intervention. Because of her body habitus she is at extremely high risk for wound complications. I stressed this with her today. Her lifestyle has been severely hindered by her inability caused from her arterial insufficiency. We discussed trying to delay this procedure however her symptoms are such that she really wants to pursue it. She is well aware of the risks of 4 and complication as well as the need for surveillance, and possible future interventions. I have scheduled her for a left femoral endarterectomy to be performed on Thursday, December 12. She'll stop her Plavix 5 days prior. I'll as her cardiologist, Dr. Anne Fu to clear her for the operation.   Initial operation cancelled due to yeast infection in left groin.  I have evaluated  this site and it looks ok to proceed today. Jorge Ny, M.D.  [facility]  Office: [phone]  Pager: [phone]

## 2012-02-22 NOTE — Anesthesia Procedure Notes (Signed)
Procedure Name: Intubation Date/Time: 02/22/2012 7:41 AM Performed by: Fransisca Kaufmann Pre-anesthesia Checklist: Patient identified, Emergency Drugs available, Suction available, Patient being monitored and Timeout performed Patient Re-evaluated:Patient Re-evaluated prior to inductionOxygen Delivery Method: Circle system utilized Preoxygenation: Pre-oxygenation with 100% oxygen Intubation Type: IV induction Ventilation: Mask ventilation without difficulty Laryngoscope Size: Miller and 3 Grade View: Grade II Tube type: Oral Tube size: 7.5 mm Number of attempts: 1 Airway Equipment and Method: Stylet Placement Confirmation: ETT inserted through vocal cords under direct vision,  positive ETCO2 and breath sounds checked- equal and bilateral Secured at: 23 cm Tube secured with: Tape Dental Injury: Teeth and Oropharynx as per pre-operative assessment

## 2012-02-22 NOTE — Progress Notes (Signed)
OOB to bathroom and A-line out, no bleeding. Site unremarkable. Pt without complaints.

## 2012-02-23 LAB — BASIC METABOLIC PANEL
Chloride: 105 mEq/L (ref 96–112)
Creatinine, Ser: 1.75 mg/dL — ABNORMAL HIGH (ref 0.50–1.10)
GFR calc Af Amer: 37 mL/min — ABNORMAL LOW (ref >90)
Potassium: 4.3 mEq/L (ref 3.5–5.1)

## 2012-02-23 LAB — GLUCOSE, CAPILLARY
Glucose-Capillary: 182 mg/dL — ABNORMAL HIGH (ref 70–99)
Glucose-Capillary: 112 mg/dL — ABNORMAL HIGH (ref 70–99)

## 2012-02-23 LAB — CBC
MCV: 99.1 fL (ref 78.0–100.0)
Platelets: 309 10*3/uL (ref 150–400)
RDW: 13.5 % (ref 11.5–15.5)
WBC: 14.4 10*3/uL — ABNORMAL HIGH (ref 4.0–10.5)

## 2012-02-23 LAB — POCT ACTIVATED CLOTTING TIME: Activated Clotting Time: 225 seconds

## 2012-02-23 MED ORDER — OXYCODONE HCL 5 MG PO TABS: 5.0000 mg | ORAL_TABLET | Freq: Four times a day (QID) | ORAL | Status: DC | PRN

## 2012-02-23 NOTE — Discharge Summary (Signed)
[facility] Discharge Summary  Stephanie Peck Nov 16, 1960 53 y.o. female  [id number]  Admission Date: 02/22/2012  Discharge Date: 02/23/12  Physician: Stephanie Libman, MD  Admission Diagnosis: PVD   HPI:   This is a 52 y.o. female who is back today for followup. She is status post right superficial femoral artery stenting on October 29. This was done in the setting of bilateral claudication. She has multiple issues for leg pain, and has been dealing with back issues for many years. She states that her pain as of late has been affecting her mobility. She is only able to ambulate approximately 50 feet before her legs give out. For that reason proceeded with her stenting. Today she states she has noticed a dramatic improvement in her right leg. Her left leg still limits her.   Hospital Course:  The patient was admitted to the hospital and taken to the operating room on 02/22/2012 and underwent  Left common femoral, superficial femoral, profundofemoral endarterectomy with patch angioplasty (bovine)  With placement of incisional wound vac.  The pt tolerated the procedure well and was transported to the PACU in good condition.   By POD 1, she was doing well.  Her wound vac was removed and dermabond was placed to the wound.  It was stressed to the pt about wound care and the risk of wound infection.  She was given instructions on this.  The remainder of the hospital course consisted of increasing mobilization and increasing intake of solids without difficulty.  CBC    Component Value Date/Time   WBC 13.1* 02/12/2012 0950   RBC 4.36 02/12/2012 0950   HGB 14.1 02/12/2012 0950   HCT 42.9 02/12/2012 0950   PLT 490* 02/12/2012 0950   MCV 98.4 02/12/2012 0950   MCH 32.3 02/12/2012 0950   MCHC 32.9 02/12/2012 0950   RDW 13.6 02/12/2012 0950   LYMPHSABS 3.0 02/05/2007 0319   MONOABS 1.8* 02/05/2007 0319   EOSABS 0.0 02/05/2007 0319   BASOSABS 0.0 02/05/2007 0319    BMET    Component  Value Date/Time   NA 140 02/12/2012 0950   K 4.1 02/12/2012 0950   CL 101 02/12/2012 0950   CO2 23 02/12/2012 0950   GLUCOSE 185* 02/12/2012 0950   BUN 16 02/12/2012 0950   CREATININE 1.36* 02/12/2012 0950   CALCIUM 9.8 02/12/2012 0950   GFRNONAA 44* 02/12/2012 0950   GFRAA 50* 02/12/2012 0950     Discharge Instructions:   The patient is discharged to home with extensive instructions on wound care and progressive ambulation.  They are instructed not to drive or perform any heavy lifting until returning to see the physician in his office.  Discharge Orders    Future Orders Please Complete By Expires   Resume previous diet      Driving Restrictions      Comments:   No driving for 2 weeks   Lifting restrictions      Comments:   No lifting for 4 weeks   Call MD for:  temperature >100.5      Call MD for:  redness, tenderness, or signs of infection (pain, swelling, bleeding, redness, odor or green/yellow discharge around incision site)      Call MD for:  severe or increased pain, loss or decreased feeling  in affected limb(s)      Discharge wound care:      Comments:   Shower daily with soap and water starting 02/23/12  Discharge Diagnosis:  PVD  Secondary Diagnosis: Patient Active Problem List  Diagnosis  . DIABETES MELLITUS, TYPE II  . OBESITY  . HYPERTENSION  . UNSTABLE ANGINA  . CONGESTIVE HEART FAILURE  . DIASTOLIC DYSFUNCTION  . GERD  . SLEEP APNEA, MILD  . HEADACHE, CHRONIC  . Leg pain, bilateral  . Low back pain  . Bilateral knee pain  . Hip pain, bilateral  . Atherosclerosis of native arteries of the extremities with intermittent claudication   Past Medical History  Diagnosis Date  . Diabetes mellitus   . GERD (gastroesophageal reflux disease)   . Anxiety   . Depression   . CHF (congestive heart failure)   . COPD (chronic obstructive pulmonary disease)   . Sleep apnea     severe OSA by 09/08/06 sleep study Stephanie Peck)      Stephanie, Peck  Home Medication  Instructions [id number]   Printed on:02/23/12 [phone]  Medication Information                    furosemide (LASIX) 80 MG tablet Take 80 mg by mouth daily as needed. For fluid retention           lansoprazole (PREVACID) 30 MG capsule Take 30 mg by mouth 2 (two) times daily.           promethazine (PHENERGAN) 25 MG tablet Take 25 mg by mouth every 6 (six) hours as needed. For nausea           DULoxetine (CYMBALTA) 60 MG capsule Take 60 mg by mouth daily.           sitaGLIPtan-metformin (JANUMET) 50-1000 MG per tablet Take 1 tablet by mouth 2 (two) times daily with a meal.           telmisartan-hydrochlorothiazide (MICARDIS HCT) 80-25 MG per tablet Take 1 tablet by mouth daily.           simvastatin (ZOCOR) 20 MG tablet Take 20 mg by mouth every evening.           buPROPion (WELLBUTRIN XL) 300 MG 24 hr tablet Take 300 mg by mouth daily.           topiramate (TOPAMAX) 50 MG tablet Take 50 mg by mouth 2 (two) times daily.           SUMAtriptan (IMITREX) 25 MG tablet Take 25 mg by mouth every 2 (two) hours as needed. For migraines           Multiple Vitamin (MULTIVITAMIN) tablet Take 1 tablet by mouth daily.           ferrous fumarate (HEMOCYTE - 106 MG FE) 325 (106 FE) MG TABS Take 1 tablet by mouth.           calcium carbonate (OS-CAL) 600 MG TABS Take 600 mg by mouth 2 (two) times daily with a meal.           clopidogrel (PLAVIX) 75 MG tablet Take 1 tablet by mouth daily.           glipiZIDE (GLUCOTROL) 5 MG tablet Take 5 mg by mouth 2 (two) times daily before a meal.           gabapentin (NEURONTIN) 600 MG tablet Take 600 mg by mouth 2 (two) times daily.            albuterol (PROVENTIL HFA;VENTOLIN HFA) 108 (90 BASE) MCG/ACT inhaler Inhale 2 puffs into the lungs every 6 (six) hours as needed. For wheezing  and shortness of breath.           albuterol (PROVENTIL) (5 MG/ML) 0.5% nebulizer solution Take 2.5 mg by nebulization every 6 (six) hours as needed. For  wheezing and shortness of breath.           oxyCODONE (OXY IR/ROXICODONE) 5 MG immediate release tablet Take 1 tablet (5 mg total) by mouth every 6 (six) hours as needed. For pain 30 NR            Disposition: home  Patient's condition: is Good  Follow up: 1. Dr. Myra Peck in 2 weeks   [name], PA-C [facility] [phone] 02/23/2012  5:19 AM

## 2012-02-23 NOTE — Progress Notes (Signed)
Discharge instructions given to pt -- verbalizes understanding of follow-up appts, wound care, s/s of complications, when to call MD/EMS and home meds. Renette Butters, Viona Gilmore

## 2012-02-23 NOTE — Progress Notes (Addendum)
[facility] Progress Note  02/23/2012 7:37 AM POD 1  Subjective:  Ready to go home  Afebrile 100-120's systolic HR  90's regulear 100% CPAP  Filed Vitals:   02/23/12 0400  BP: 112/49  Pulse: 84  Temp: 98.1 F (36.7 C)  Resp: 18    Physical Exam: Incisions:  With wound vac on incision Extremities:  Left foot is warm and well perfused.  + doppler signal left DP/PT  CBC    Component Value Date/Time   WBC 14.4* 02/23/2012 0450   RBC 3.52* 02/23/2012 0450   HGB 11.5* 02/23/2012 0450   HCT 34.9* 02/23/2012 0450   PLT 309 02/23/2012 0450   MCV 99.1 02/23/2012 0450   MCH 32.7 02/23/2012 0450   MCHC 33.0 02/23/2012 0450   RDW 13.5 02/23/2012 0450   LYMPHSABS 3.0 02/05/2007 0319   MONOABS 1.8* 02/05/2007 0319   EOSABS 0.0 02/05/2007 0319   BASOSABS 0.0 02/05/2007 0319    BMET    Component Value Date/Time   NA 140 02/23/2012 0450   K 4.3 02/23/2012 0450   CL 105 02/23/2012 0450   CO2 27 02/23/2012 0450   GLUCOSE 131* 02/23/2012 0450   BUN 20 02/23/2012 0450   CREATININE 1.75* 02/23/2012 0450   CALCIUM 8.8 02/23/2012 0450   GFRNONAA 32* 02/23/2012 0450   GFRAA 37* 02/23/2012 0450    INR    Component Value Date/Time   INR 0.96 02/12/2012 0950     Intake/Output Summary (Last 24 hours) at 02/23/12 0737 Last data filed at 02/23/12 0400  Gross per 24 hour  Intake   3675 ml  Output   2550 ml  Net   1125 ml     Assessment/Plan:  52 y.o. female is s/p:  Left common femoral, superficial femoral, profundofemoral endarterectomy with patch angioplasty (bovine)   POD 1  -doing well this am. -wants to go home -remove incisional vac -discussed importance of wound care and keeping groin area clean and dry to prevent wound infection. -she needs to void before discharge after foley is removed.   [name], PA-C [facility] [phone] 02/23/2012 7:37 AM    Wound vac removed with minimal drainage Good distal pulses, faintly  palpable Discussed importance of maticulous wound care dermabond placed on wound Patient wants to go home  Stephanie Peck

## 2012-02-25 ENCOUNTER — Encounter (HOSPITAL_COMMUNITY): Payer: Self-pay | Admitting: Surgery

## 2012-02-25 ENCOUNTER — Telehealth: Payer: Self-pay | Admitting: Surgery

## 2012-02-25 NOTE — Telephone Encounter (Addendum)
Message copied by Rosalyn Charters on Mon Feb 25, 2012  9:21 AM ------      Message from: [place], [place] K      Created: Mon Feb 25, 2012  8:10 AM      Regarding: schedule                   ----- Message -----         From: Dara Lords, PA         Sent: 02/23/2012   8:02 AM           To: Sharee Pimple, CMA            S/p Left common femoral, superficial femoral, profundofemoral endarterectomy with patch angioplasty (bovine) on 02/22/12 by Dr. Myra Gianotti.  F/u with him in  2 weeks.            Thanks,      Samantha  notified patient of fu appt. with dr. Myra Gianotti on 03-10-12 at 8:45 am

## 2012-02-26 ENCOUNTER — Other Ambulatory Visit: Payer: Self-pay | Admitting: Family Medicine

## 2012-02-26 DIAGNOSIS — R0600 Dyspnea, unspecified: Secondary | ICD-10-CM

## 2012-02-28 NOTE — Discharge Summary (Signed)
I agree with the above  Stephanie Peck 

## 2012-02-29 ENCOUNTER — Encounter: Payer: Self-pay | Admitting: Neurosurgery

## 2012-02-29 ENCOUNTER — Ambulatory Visit: Payer: Medicare Other | Admitting: Neurosurgery

## 2012-02-29 ENCOUNTER — Telehealth: Payer: Self-pay

## 2012-02-29 ENCOUNTER — Encounter (HOSPITAL_COMMUNITY): Payer: Self-pay

## 2012-02-29 ENCOUNTER — Inpatient Hospital Stay (HOSPITAL_COMMUNITY)
Admission: AD | Admit: 2012-02-29 | Discharge: 2012-03-07 | DRG: 571 | Disposition: A | Discharge status: Home / Self Care | Payer: Medicare Other | Source: Ambulatory Visit | Attending: Surgery | Admitting: Surgery

## 2012-02-29 DIAGNOSIS — Z833 Family history of diabetes mellitus: Secondary | ICD-10-CM

## 2012-02-29 DIAGNOSIS — E119 Type 2 diabetes mellitus without complications: Secondary | ICD-10-CM | POA: Diagnosis not present

## 2012-02-29 DIAGNOSIS — L988 Other specified disorders of the skin and subcutaneous tissue: Principal | ICD-10-CM | POA: Diagnosis present

## 2012-02-29 DIAGNOSIS — K219 Gastro-esophageal reflux disease without esophagitis: Secondary | ICD-10-CM | POA: Diagnosis present

## 2012-02-29 DIAGNOSIS — F3289 Other specified depressive episodes: Secondary | ICD-10-CM | POA: Diagnosis present

## 2012-02-29 DIAGNOSIS — T8140XA Infection following a procedure, unspecified, initial encounter: Secondary | ICD-10-CM | POA: Diagnosis not present

## 2012-02-29 DIAGNOSIS — J449 Chronic obstructive pulmonary disease, unspecified: Secondary | ICD-10-CM | POA: Diagnosis present

## 2012-02-29 DIAGNOSIS — S31109A Unspecified open wound of abdominal wall, unspecified quadrant without penetration into peritoneal cavity, initial encounter: Secondary | ICD-10-CM | POA: Diagnosis not present

## 2012-02-29 DIAGNOSIS — J4489 Other specified chronic obstructive pulmonary disease: Secondary | ICD-10-CM | POA: Diagnosis present

## 2012-02-29 DIAGNOSIS — Z87891 Personal history of nicotine dependence: Secondary | ICD-10-CM

## 2012-02-29 DIAGNOSIS — E669 Obesity, unspecified: Secondary | ICD-10-CM | POA: Diagnosis present

## 2012-02-29 DIAGNOSIS — Z8249 Family history of ischemic heart disease and other diseases of the circulatory system: Secondary | ICD-10-CM | POA: Diagnosis not present

## 2012-02-29 DIAGNOSIS — Z9884 Bariatric surgery status: Secondary | ICD-10-CM

## 2012-02-29 DIAGNOSIS — F411 Generalized anxiety disorder: Secondary | ICD-10-CM | POA: Diagnosis present

## 2012-02-29 DIAGNOSIS — Z6841 Body Mass Index (BMI) 40.0 and over, adult: Secondary | ICD-10-CM

## 2012-02-29 DIAGNOSIS — I739 Peripheral vascular disease, unspecified: Secondary | ICD-10-CM

## 2012-02-29 DIAGNOSIS — F329 Major depressive disorder, single episode, unspecified: Secondary | ICD-10-CM | POA: Diagnosis present

## 2012-02-29 DIAGNOSIS — R1030 Lower abdominal pain, unspecified: Secondary | ICD-10-CM

## 2012-02-29 DIAGNOSIS — G4733 Obstructive sleep apnea (adult) (pediatric): Secondary | ICD-10-CM | POA: Diagnosis present

## 2012-02-29 LAB — COMPREHENSIVE METABOLIC PANEL
Alkaline Phosphatase: 60 U/L (ref 39–117)
BUN: 23 mg/dL (ref 6–23)
CO2: 22 mEq/L (ref 19–32)
Chloride: 101 mEq/L (ref 96–112)
GFR calc Af Amer: 43 mL/min — ABNORMAL LOW (ref >90)
Glucose, Bld: 114 mg/dL — ABNORMAL HIGH (ref 70–99)
Potassium: 4.8 mEq/L (ref 3.5–5.1)
Total Bilirubin: 0.2 mg/dL — ABNORMAL LOW (ref 0.3–1.2)

## 2012-02-29 LAB — CBC
HCT: 36.3 % (ref 36.0–46.0)
Hemoglobin: 11.9 g/dL — ABNORMAL LOW (ref 12.0–15.0)
RBC: 3.74 MIL/uL — ABNORMAL LOW (ref 3.87–5.11)
WBC: 15.3 10*3/uL — ABNORMAL HIGH (ref 4.0–10.5)

## 2012-02-29 LAB — URINALYSIS, ROUTINE W REFLEX MICROSCOPIC
Bilirubin Urine: NEGATIVE
Glucose, UA: NEGATIVE mg/dL
Hgb urine dipstick: NEGATIVE
Protein, ur: NEGATIVE mg/dL
Specific Gravity, Urine: 1.01 (ref 1.005–1.030)
Urobilinogen, UA: 0.2 mg/dL (ref 0.0–1.0)

## 2012-02-29 LAB — URINE MICROSCOPIC-ADD ON

## 2012-02-29 LAB — PROTIME-INR: Prothrombin Time: 13.5 seconds (ref 11.6–15.2)

## 2012-02-29 LAB — GLUCOSE, CAPILLARY

## 2012-02-29 MED ORDER — ALBUTEROL SULFATE HFA 108 (90 BASE) MCG/ACT IN AERS
2.0000 | INHALATION_SPRAY | Freq: Four times a day (QID) | RESPIRATORY_TRACT | Status: DC | PRN
  Filled 2012-02-29: qty 6.7

## 2012-02-29 MED ORDER — PROMETHAZINE HCL 25 MG PO TABS: 25.0000 mg | ORAL_TABLET | Freq: Four times a day (QID) | ORAL | Status: DC | PRN

## 2012-02-29 MED ORDER — SODIUM CHLORIDE 0.9 % IJ SOLN
3.0000 mL | Freq: Two times a day (BID) | INTRAMUSCULAR | Status: DC
  Administered 2012-03-02 – 2012-03-06 (×6): 3 mL via INTRAVENOUS

## 2012-02-29 MED ORDER — PHENOL 1.4 % MT LIQD
1.0000 | OROMUCOSAL | Status: DC | PRN
  Filled 2012-02-29: qty 177

## 2012-02-29 MED ORDER — LABETALOL HCL 5 MG/ML IV SOLN
10.0000 mg | INTRAVENOUS | Status: DC | PRN
  Filled 2012-02-29: qty 4

## 2012-02-29 MED ORDER — SENNA 8.6 MG PO TABS
1.0000 | ORAL_TABLET | Freq: Two times a day (BID) | ORAL | Status: DC
  Administered 2012-02-29 – 2012-03-06 (×7): 8.6 mg via ORAL
  Filled 2012-02-29 (×15): qty 1

## 2012-02-29 MED ORDER — GLIPIZIDE 5 MG PO TABS
5.0000 mg | ORAL_TABLET | Freq: Two times a day (BID) | ORAL | Status: DC
  Administered 2012-03-01 – 2012-03-07 (×12): 5 mg via ORAL
  Filled 2012-02-29 (×14): qty 1

## 2012-02-29 MED ORDER — FERROUS FUMARATE 325 (106 FE) MG PO TABS
1.0000 | ORAL_TABLET | Freq: Every day | ORAL | Status: DC
  Filled 2012-02-29: qty 1

## 2012-02-29 MED ORDER — VANCOMYCIN HCL 10 G IV SOLR
1750.0000 mg | INTRAVENOUS | Status: DC
  Administered 2012-02-29 – 2012-03-06 (×7): 1750 mg via INTRAVENOUS
  Filled 2012-02-29 (×8): qty 1750

## 2012-02-29 MED ORDER — METOPROLOL TARTRATE 1 MG/ML IV SOLN: 2.0000 mg | INTRAVENOUS | Status: DC | PRN

## 2012-02-29 MED ORDER — ACETAMINOPHEN 325 MG PO TABS: 325.0000 mg | ORAL_TABLET | ORAL | Status: DC | PRN

## 2012-02-29 MED ORDER — PANTOPRAZOLE SODIUM 20 MG PO TBEC
20.0000 mg | DELAYED_RELEASE_TABLET | Freq: Every day | ORAL | Status: DC
  Administered 2012-03-02 – 2012-03-06 (×5): 20 mg via ORAL
  Filled 2012-02-29 (×8): qty 1

## 2012-02-29 MED ORDER — PANTOPRAZOLE SODIUM 40 MG PO TBEC: 40.0000 mg | DELAYED_RELEASE_TABLET | Freq: Every day | ORAL | Status: DC

## 2012-02-29 MED ORDER — LINAGLIPTIN 5 MG PO TABS
5.0000 mg | ORAL_TABLET | Freq: Every day | ORAL | Status: DC
  Administered 2012-03-01 – 2012-03-07 (×7): 5 mg via ORAL
  Filled 2012-02-29 (×7): qty 1

## 2012-02-29 MED ORDER — TOPIRAMATE 25 MG PO TABS: 50.0000 mg | ORAL_TABLET | Freq: Two times a day (BID) | ORAL | Status: DC

## 2012-02-29 MED ORDER — OXYCODONE HCL 5 MG PO TABS
5.0000 mg | ORAL_TABLET | Freq: Four times a day (QID) | ORAL | Status: DC | PRN
  Filled 2012-02-29: qty 1

## 2012-02-29 MED ORDER — CLOPIDOGREL BISULFATE 75 MG PO TABS
75.0000 mg | ORAL_TABLET | Freq: Every day | ORAL | Status: DC
  Administered 2012-03-02 – 2012-03-07 (×6): 75 mg via ORAL
  Filled 2012-02-29 (×8): qty 1

## 2012-02-29 MED ORDER — MORPHINE SULFATE 2 MG/ML IJ SOLN
2.0000 mg | INTRAMUSCULAR | Status: DC | PRN
  Administered 2012-02-29: 2 mg via INTRAVENOUS
  Administered 2012-03-02: 4 mg via INTRAVENOUS
  Administered 2012-03-02: 2 mg via INTRAVENOUS
  Administered 2012-03-02 – 2012-03-04 (×7): 4 mg via INTRAVENOUS
  Administered 2012-03-04: 2 mg via INTRAVENOUS
  Administered 2012-03-04 – 2012-03-05 (×2): 4 mg via INTRAVENOUS
  Administered 2012-03-05: 2 mg via INTRAVENOUS
  Administered 2012-03-06: 4 mg via INTRAVENOUS
  Administered 2012-03-06: 2 mg via INTRAVENOUS
  Filled 2012-02-29: qty 1
  Filled 2012-02-29: qty 2
  Filled 2012-02-29 (×2): qty 1
  Filled 2012-02-29 (×4): qty 2
  Filled 2012-02-29: qty 4
  Filled 2012-02-29: qty 1
  Filled 2012-02-29 (×3): qty 2
  Filled 2012-02-29 (×2): qty 1
  Filled 2012-02-29 (×3): qty 2

## 2012-02-29 MED ORDER — TEMAZEPAM 15 MG PO CAPS: 15.0000 mg | ORAL_CAPSULE | Freq: Every evening | ORAL | Status: DC | PRN

## 2012-02-29 MED ORDER — CALCIUM CARBONATE 600 MG PO TABS
600.0000 mg | ORAL_TABLET | Freq: Two times a day (BID) | ORAL | Status: DC
  Filled 2012-02-29 (×2): qty 1

## 2012-02-29 MED ORDER — ADULT MULTIVITAMIN W/MINERALS CH
1.0000 | ORAL_TABLET | Freq: Every day | ORAL | Status: DC
  Administered 2012-03-01 – 2012-03-07 (×7): 1 via ORAL
  Filled 2012-02-29 (×8): qty 1

## 2012-02-29 MED ORDER — IRBESARTAN 300 MG PO TABS
300.0000 mg | ORAL_TABLET | Freq: Every day | ORAL | Status: DC
  Administered 2012-03-01 – 2012-03-07 (×7): 300 mg via ORAL
  Filled 2012-02-29 (×8): qty 1

## 2012-02-29 MED ORDER — ACETAMINOPHEN 650 MG RE SUPP: 325.0000 mg | RECTAL | Status: DC | PRN

## 2012-02-29 MED ORDER — SIMVASTATIN 20 MG PO TABS
20.0000 mg | ORAL_TABLET | Freq: Every evening | ORAL | Status: DC
  Administered 2012-02-29 – 2012-03-06 (×7): 20 mg via ORAL
  Filled 2012-02-29 (×8): qty 1

## 2012-02-29 MED ORDER — ONE-DAILY MULTI VITAMINS PO TABS: 1.0000 | ORAL_TABLET | Freq: Every day | ORAL | Status: DC

## 2012-02-29 MED ORDER — GUAIFENESIN-DM 100-10 MG/5ML PO SYRP: 15.0000 mL | ORAL_SOLUTION | ORAL | Status: DC | PRN

## 2012-02-29 MED ORDER — GABAPENTIN 600 MG PO TABS
600.0000 mg | ORAL_TABLET | Freq: Two times a day (BID) | ORAL | Status: DC
  Administered 2012-02-29 – 2012-03-07 (×14): 600 mg via ORAL
  Filled 2012-02-29 (×15): qty 1

## 2012-02-29 MED ORDER — HYDRALAZINE HCL 20 MG/ML IJ SOLN
10.0000 mg | INTRAMUSCULAR | Status: DC | PRN
  Filled 2012-02-29: qty 0.5

## 2012-02-29 MED ORDER — POTASSIUM CHLORIDE CRYS ER 20 MEQ PO TBCR: 20.0000 meq | EXTENDED_RELEASE_TABLET | Freq: Once | ORAL | Status: DC

## 2012-02-29 MED ORDER — HYDROCHLOROTHIAZIDE 25 MG PO TABS
25.0000 mg | ORAL_TABLET | Freq: Every day | ORAL | Status: DC
  Administered 2012-03-01 – 2012-03-07 (×7): 25 mg via ORAL
  Filled 2012-02-29 (×8): qty 1

## 2012-02-29 MED ORDER — BUPROPION HCL ER (XL) 300 MG PO TB24
300.0000 mg | ORAL_TABLET | Freq: Every day | ORAL | Status: DC
  Administered 2012-03-01 – 2012-03-07 (×7): 300 mg via ORAL
  Filled 2012-02-29 (×8): qty 1

## 2012-02-29 MED ORDER — TOPIRAMATE 25 MG PO TABS
50.0000 mg | ORAL_TABLET | Freq: Two times a day (BID) | ORAL | Status: DC
  Administered 2012-02-29 – 2012-03-07 (×13): 50 mg via ORAL
  Filled 2012-02-29 (×15): qty 2

## 2012-02-29 MED ORDER — FE FUMARATE-B12-VIT C-FA-IFC PO CAPS
1.0000 | ORAL_CAPSULE | Freq: Every day | ORAL | Status: DC
  Administered 2012-03-01 – 2012-03-07 (×7): 1 via ORAL
  Filled 2012-02-29 (×7): qty 1

## 2012-02-29 MED ORDER — ONDANSETRON HCL 4 MG/2ML IJ SOLN: 4.0000 mg | Freq: Four times a day (QID) | INTRAMUSCULAR | Status: DC | PRN

## 2012-02-29 MED ORDER — SODIUM CHLORIDE 0.9 % IV SOLN
250.0000 mL | INTRAVENOUS | Status: DC | PRN
  Administered 2012-02-29: 250 mL via INTRAVENOUS
  Administered 2012-03-01: 09:00:00 via INTRAVENOUS
  Administered 2012-03-06: 250 mL via INTRAVENOUS

## 2012-02-29 MED ORDER — SODIUM CHLORIDE 0.9 % IJ SOLN
3.0000 mL | INTRAMUSCULAR | Status: DC | PRN
  Administered 2012-03-01 – 2012-03-03 (×3): 3 mL via INTRAVENOUS

## 2012-02-29 MED ORDER — DULOXETINE HCL 60 MG PO CPEP
60.0000 mg | ORAL_CAPSULE | Freq: Every day | ORAL | Status: DC
  Administered 2012-03-01 – 2012-03-07 (×7): 60 mg via ORAL
  Filled 2012-02-29 (×8): qty 1

## 2012-02-29 MED ORDER — PIPERACILLIN-TAZOBACTAM 3.375 G IVPB
3.3750 g | Freq: Three times a day (TID) | INTRAVENOUS | Status: DC
  Administered 2012-02-29 – 2012-03-07 (×20): 3.375 g via INTRAVENOUS
  Filled 2012-02-29 (×23): qty 50

## 2012-02-29 MED ORDER — OXYCODONE HCL 5 MG PO TABS
5.0000 mg | ORAL_TABLET | ORAL | Status: DC | PRN
  Administered 2012-02-29 – 2012-03-01 (×2): 10 mg via ORAL
  Administered 2012-03-01 – 2012-03-02 (×3): 5 mg via ORAL
  Administered 2012-03-02: 10 mg via ORAL
  Administered 2012-03-02: 5 mg via ORAL
  Administered 2012-03-02 – 2012-03-03 (×8): 10 mg via ORAL
  Administered 2012-03-04: 5 mg via ORAL
  Administered 2012-03-04 – 2012-03-06 (×8): 10 mg via ORAL
  Administered 2012-03-06: 5 mg via ORAL
  Administered 2012-03-06: 10 mg via ORAL
  Administered 2012-03-07: 5 mg via ORAL
  Filled 2012-02-29 (×21): qty 2
  Filled 2012-02-29: qty 1
  Filled 2012-02-29 (×4): qty 2

## 2012-02-29 MED ORDER — TELMISARTAN-HCTZ 80-25 MG PO TABS: 1.0000 | ORAL_TABLET | Freq: Every day | ORAL | Status: DC

## 2012-02-29 MED ORDER — SITAGLIPTIN PHOS-METFORMIN HCL 50-1000 MG PO TABS: 1.0000 | ORAL_TABLET | Freq: Two times a day (BID) | ORAL | Status: DC

## 2012-02-29 NOTE — Telephone Encounter (Signed)
Phone call from pt. C/o  Left groin incision has a pus-like appearance to it.  Describes a "large knot @ base of incision, and a raised tubular shape to left of incision.  C/o throbbing pain.  States it is "a little red and warm."  Denies fever or chills. Appt. Given for eval. Today at 4:00 PM.  Agrees with plan.

## 2012-02-29 NOTE — Progress Notes (Unsigned)
[facility] [place] HISTORY AND PHYSICAL   CC:  Non healing wound Stephanie Peck, *  HPI: This is a 52 y.o. female  Who underwent left CFA, SFA and profundofemoral endarterectomy with patch angioplasty (bovine).  She did have an incisional wound vac that was removed on POD 1 and dermabond was placed on her incision.  She was given instructions on meticulous wound care before discharge.  She presents today with pain and foul smelling odor of her left groin incision.  She denies fevers, but states that she has just been feeling bad.  Past Medical History  Diagnosis Date  . Diabetes mellitus   . GERD (gastroesophageal reflux disease)   . Anxiety   . Depression   . CHF (congestive heart failure)   . COPD (chronic obstructive pulmonary disease)   . Sleep apnea     severe OSA by 09/08/06 sleep study [name])   Past Surgical History  Procedure Date  . Cholecystectomy   . Gastric bypass 2006  . Tonsillectomy   . Femoral artery stent 12/05/2011    right superficial   . Endarterectomy femoral 02/22/2012    Procedure: ENDARTERECTOMY FEMORAL;  Surgeon: Nada Libman, MD;  Location: [facility] OR;  Service: Vascular;  Laterality: Left;  . Patch angioplasty 02/22/2012    Procedure: PATCH ANGIOPLASTY;  Surgeon: Nada Libman, MD;  Location: [facility] OR;  Service: Vascular;  Laterality: Left;  left femoral patch angioplasty  . Application of wound vac 02/22/2012    Procedure: APPLICATION OF WOUND VAC;  Surgeon: Nada Libman, MD;  Location: [facility] OR;  Service: Vascular;  Laterality: Left;  application wound vac    Allergies  Allergen Reactions  . Codeine Nausea Only    Current Outpatient Prescriptions  Medication Sig Dispense Refill  . albuterol (PROVENTIL HFA;VENTOLIN HFA) 108 (90 BASE) MCG/ACT inhaler Inhale 2 puffs into the lungs every 6 (six) hours as needed. For wheezing and shortness of breath.      Marland Kitchen albuterol (PROVENTIL) (5 MG/ML) 0.5% nebulizer solution Take 2.5 mg by  nebulization every 6 (six) hours as needed. For wheezing and shortness of breath.      Marland Kitchen buPROPion (WELLBUTRIN XL) 300 MG 24 hr tablet Take 300 mg by mouth daily.      . calcium carbonate (OS-CAL) 600 MG TABS Take 600 mg by mouth 2 (two) times daily with a meal.      . clopidogrel (PLAVIX) 75 MG tablet Take 1 tablet by mouth daily.      . DULoxetine (CYMBALTA) 60 MG capsule Take 60 mg by mouth daily.      . ferrous fumarate (HEMOCYTE - 106 MG FE) 325 (106 FE) MG TABS Take 1 tablet by mouth.      . furosemide (LASIX) 80 MG tablet Take 80 mg by mouth daily as needed. For fluid retention      . gabapentin (NEURONTIN) 600 MG tablet Take 600 mg by mouth 2 (two) times daily.       Marland Kitchen glipiZIDE (GLUCOTROL) 5 MG tablet Take 5 mg by mouth 2 (two) times daily before a meal.      . lansoprazole (PREVACID) 30 MG capsule Take 30 mg by mouth 2 (two) times daily.      . Multiple Vitamin (MULTIVITAMIN) tablet Take 1 tablet by mouth daily.      Marland Kitchen oxyCODONE (OXY IR/ROXICODONE) 5 MG immediate release tablet Take 1 tablet (5 mg total) by mouth every 6 (six) hours as needed. For  pain  30 tablet  0  . promethazine (PHENERGAN) 25 MG tablet Take 25 mg by mouth every 6 (six) hours as needed. For nausea      . simvastatin (ZOCOR) 20 MG tablet Take 20 mg by mouth every evening.      . sitaGLIPtan-metformin (JANUMET) 50-1000 MG per tablet Take 1 tablet by mouth 2 (two) times daily with a meal.      . SUMAtriptan (IMITREX) 25 MG tablet Take 25 mg by mouth every 2 (two) hours as needed. For migraines      . telmisartan-hydrochlorothiazide (MICARDIS HCT) 80-25 MG per tablet Take 1 tablet by mouth daily.      Marland Kitchen topiramate (TOPAMAX) 50 MG tablet Take 50 mg by mouth 2 (two) times daily.        Family History  Problem Relation Age of Onset  . Cancer Mother   . Depression Mother   . Heart disease Father     before age 69  . Diabetes Father   . Hypertension Father   . Hyperlipidemia Father   . Heart attack Father   . Cancer  Maternal Grandmother     History   Social History  . Marital Status: Married    Spouse Name: N/A    Number of Children: N/A  . Years of Education: N/A   Occupational History  . Not on file.   Social History Main Topics  . Smoking status: Former Smoker -- 2.0 packs/day for 20 years    Types: Cigarettes    Start date: 09/30/2011    Quit date: 10/30/2011  . Smokeless tobacco: Never Used  . Alcohol Use: No  . Drug Use: No  . Sexually Active: Not on file   Other Topics Concern  . Not on file   Social History Narrative  . No narrative on file     ROS: [x]  Positive   [ ]  Negative   [ ]  All sytems reviewed and are negative [x]  see HPI  General: [ ]  Weight loss, [ ]  Weight gain, [ ]   Loss of appetite, [ ]  Fever Neurologic: [ ]  Dizziness, [ ]  Blackouts, [ ]  Headaches, [ ]  Seizure, Stroke, [ ]  "Mini stroke", [ ]  Slurred speech, [ ]  Temporary blindness Ear/Nose/Throat: [ ]  Change in eyesight, [ ]  Change in hearing, [ ]  Nose bleeds, [ ]  Sore throat Vascular: [ ]  Pain in legs with walking, [ ]  Pain in feet while lying flat, [ ]  Non-healing ulcer,  [ ]  Blood clot in vein, [ ]  Phlebitis Pulmonary: [ ]  Home oxygen, [ ]  Productive cough, [ ]  Bronchitis, [ ]  Coughing up blood,  [ ]  Asthma, [ ]  Wheezing Musculoskeletal: [ ]  Arthritis, [ ]  Joint pain, [ ]  Muscle pain Cardiac: [ ]  Chest pain, [ ]  Chest tightness/pressure, [ ]  Shortness of breath when lying flat, [ ]  Shortness of breath with exertion, [ ]  Palpitations, [ ]  Heart murmur, [ ]  Arrythmia,  [ ]  Atrial fibrillation Hematologic: [ ]  Bleeding problems, [ ]  Clotting disorder, [ ]  Anemia Psychiatric:  [ ]  Depression, [ ]  Anxiety Gastrointestinal:  [ ]  Black stool,[ ]   Blood in stool, [ ]  Peptic ulcer disease, [ ]  Reflux, [ ]  Hiatal hernia, [ ]  Trouble swallowing, [ ]  Diarrhea, [ ]  Constipation Urinary:  [ ]  Kidney disease, [ ]  Burning with urination, [ ]  nocturia, [ ]  Difficulty urinating Endocrine: [ ]  hx diabetes, [ ]  hx thyroid  disease Skin: [ ]  Ulcers, [ ]  Rashes  PHYSICAL EXAMINATION:  There were no vitals filed for this visit. There is no height or weight on file to calculate BMI.  General:  WDWN in NAD Gait: Normal HENT: WNL Eyes: PERRL Pulmonary: normal non-labored breathing , without Rales, rhonchi,  wheezing Cardiac: RRR, without  Murmurs, rubs or gallops; No carotid bruits Abdomen: soft, NT, no masses Skin: no rashes, ulcers noted Vascular Exam/Pulses: left groin with purulent drainage and necrosis around the edges of the incisions.  It is tender with palpation. Extremities: without ischemic changes, no Gangrene , no cellulitis; no open wounds;  Musculoskeletal: no muscle wasting or atrophy  Neurologic: A&O X 3; Appropriate Affect ; SENSATION: normal; MOTOR FUNCTION:  moving all extremities equally. Speech is fluent/normal   Non-Invasive Vascular Imaging:  None   CBC    Component Value Date/Time   WBC 14.4* 02/23/2012 0450   RBC 3.52* 02/23/2012 0450   HGB 11.5* 02/23/2012 0450   HCT 34.9* 02/23/2012 0450   PLT 309 02/23/2012 0450   MCV 99.1 02/23/2012 0450   MCH 32.7 02/23/2012 0450   MCHC 33.0 02/23/2012 0450   RDW 13.5 02/23/2012 0450   LYMPHSABS 3.0 02/05/2007 0319   MONOABS 1.8* 02/05/2007 0319   EOSABS 0.0 02/05/2007 0319   BASOSABS 0.0 02/05/2007 0319    BMET    Component Value Date/Time   NA 140 02/23/2012 0450   K 4.3 02/23/2012 0450   CL 105 02/23/2012 0450   CO2 27 02/23/2012 0450   GLUCOSE 131* 02/23/2012 0450   BUN 20 02/23/2012 0450   CREATININE 1.75* 02/23/2012 0450   CALCIUM 8.8 02/23/2012 0450   GFRNONAA 32* 02/23/2012 0450   GFRAA 37* 02/23/2012 0450     ASSESSMENT/PLAN: 52 y.o. female Left common femoral, superficial femoral, profundofemoral endarterectomy with patch angioplasty (bovine) With placement of incisional wound vac.  -she presents to the office today with a wound infection and necrosis of the wound edges.  -we will admit her to 2000 and start broad spectrum  ABx -she will need wound debridement in the OR.  She has eaten today at 1pm.   [name], PA-C [facility] [phone]  Clinic MD Imogene Burn

## 2012-02-29 NOTE — Progress Notes (Signed)
PHARMACIST - PHYSICIAN COMMUNICATION  CONCERNING:  METFORMIN SAFE ADMINISTRATION POLICY  RECOMMENDATION: Metformin has been placed on DISCONTINUE (rejected order) STATUS and should be reordered only after any of the conditions below are ruled out.  Current safety recommendations include avoiding metformin for a minimum of 48 hours after the patient's exposure to intravenous contrast media.  DESCRIPTION:  The Pharmacy Committee has adopted a policy that restricts the use of metformin in hospitalized patients until all the contraindications to administration have been ruled out. Specific contraindications are: []  Serum creatinine ? 1.5 for males [x]  Serum creatinine ? 1.4 for females []  Shock, acute MI, sepsis, hypoxemia, dehydration []  Planned administration of intravenous iodinated contrast media []  Heart Failure patients with low EF []  Acute or chronic metabolic acidosis (including DKA)      Thank you, Harland German, Pharm D 02/29/2012 9:12 PM

## 2012-02-29 NOTE — Progress Notes (Signed)
ANTIBIOTIC CONSULT NOTE - INITIAL  Pharmacy Consult for  Vancomycin and Zosyn Indication:  Left groin wound infection  Allergies  Allergen Reactions  . Codeine Nausea Only   Patient Measurements: Weight: 276 lb 3.2 oz (125.283 kg) IBW: 66 kg Adjusted Body Weight: 90 kg Height:  67 inches  Vital Signs: Temp: 98.2 F (36.8 C) (01/31 1723) Temp src: Oral (01/31 1723) BP: 119/60 mmHg (01/31 1723) Pulse Rate: 92  (01/31 1723)  Labs:  Basename 02/29/12 1808  WBC 15.3*  HGB 11.9*  PLT 419*  LABCREA --  CREATININE --   Microbiology: Recent Results (from the past 720 hour(s))  SURGICAL PCR SCREEN     Status: Normal   Collection Time   02/12/12  8:33 AM      Component Value Range Status Comment   MRSA, PCR NEGATIVE  NEGATIVE Final    Staphylococcus aureus NEGATIVE  NEGATIVE Final   URINE CULTURE     Status: Normal   Collection Time   02/12/12 10:00 AM      Component Value Range Status Comment   Specimen Description URINE, CLEAN CATCH   Final    Special Requests NONE   Final    Culture  Setup Time 02/12/2012 11:57   Final    Colony Count 45,000 COLONIES/ML   Final    Culture     Final    Value: Multiple bacterial morphotypes present, none predominant. Suggest appropriate recollection if clinically indicated.   Report Status 02/13/2012 FINAL   Final     Medical History: Past Medical History  Diagnosis Date  . Diabetes mellitus   . GERD (gastroesophageal reflux disease)   . Anxiety   . Depression   . CHF (congestive heart failure)   . COPD (chronic obstructive pulmonary disease)   . Sleep apnea     severe OSA by 09/08/06 sleep study [name])    Medications:  Anti-infectives    None     Assessment: 52 yo female who presents with pain and foul smelling odor of her left groin incision.  She recently had a endarterectomy and an incisional wound vac that was removed on POD1.  She is being admitted for broad spectrum antibiotic coverage with plans for I&D of the left  groin wound site.  She is morbidly obese with an adjusted weight of 90 kg.  She has a creatinine of 1.55 and an estimated clearance of ~ 40 ml/min.  She has some mild leukocytosis as well with a WBC of 15.3K.  She is currently afebrile.  Goal of Therapy:  Vancomycin trough level 10-15 mcg/ml  Plan:  Will begin Vancomycin at 1750 mg every 24 hours Will start IV Zosyn at 3.75 gm every 8 hours. F/U renal function to ensure appropriate clearance Monitor s/s levels if IV Vancomycin to continue > 96 hours.  Nadara Mustard, PharmD., MS Clinical Pharmacist Pager:  [phone] Thank you for allowing pharmacy to be part of this patients care team. 02/29/2012,6:54 PM

## 2012-02-29 NOTE — H&P (Signed)
[facility] [place]  HISTORY AND PHYSICAL   CC: Non healing wound   Stephanie Peck, *   HPI: This is a 52 y.o. female Who underwent left CFA, SFA and profundofemoral endarterectomy with patch angioplasty (bovine). She did have an incisional wound vac that was removed on POD 1 and dermabond was placed on her incision. She was given instructions on meticulous wound care before discharge. She presents today with pain and foul smelling odor of her left groin incision. She denies fevers, but states that she has just been feeling bad.   Past Medical History   Diagnosis  Date   .  Diabetes mellitus    .  GERD (gastroesophageal reflux disease)    .  Anxiety    .  Depression    .  CHF (congestive heart failure)    .  COPD (chronic obstructive pulmonary disease)    .  Sleep apnea      severe OSA by 09/08/06 sleep study [name])    Past Surgical History   Procedure  Date   .  Cholecystectomy    .  Gastric bypass  2006   .  Tonsillectomy    .  Femoral artery stent  12/05/2011     right superficial   .  Endarterectomy femoral  02/22/2012     Procedure: ENDARTERECTOMY FEMORAL; Surgeon: Nada Libman, MD; Location: [facility] OR; Service: Vascular; Laterality: Left;   .  Patch angioplasty  02/22/2012     Procedure: PATCH ANGIOPLASTY; Surgeon: Nada Libman, MD; Location: [facility] OR; Service: Vascular; Laterality: Left; left femoral patch angioplasty   .  Application of wound vac  02/22/2012     Procedure: APPLICATION OF WOUND VAC; Surgeon: Nada Libman, MD; Location: [facility] OR; Service: Vascular; Laterality: Left; application wound vac    Allergies   Allergen  Reactions   .  Codeine  Nausea Only    Current Outpatient Prescriptions   Medication  Sig  Dispense  Refill   .  albuterol (PROVENTIL HFA;VENTOLIN HFA) 108 (90 BASE) MCG/ACT inhaler  Inhale 2 puffs into the lungs every 6 (six) hours as needed. For wheezing and shortness of breath.     Marland Kitchen  albuterol (PROVENTIL) (5 MG/ML)  0.5% nebulizer solution  Take 2.5 mg by nebulization every 6 (six) hours as needed. For wheezing and shortness of breath.     Marland Kitchen  buPROPion (WELLBUTRIN XL) 300 MG 24 hr tablet  Take 300 mg by mouth daily.     .  calcium carbonate (OS-CAL) 600 MG TABS  Take 600 mg by mouth 2 (two) times daily with a meal.     .  clopidogrel (PLAVIX) 75 MG tablet  Take 1 tablet by mouth daily.     .  DULoxetine (CYMBALTA) 60 MG capsule  Take 60 mg by mouth daily.     .  ferrous fumarate (HEMOCYTE - 106 MG FE) 325 (106 FE) MG TABS  Take 1 tablet by mouth.     .  furosemide (LASIX) 80 MG tablet  Take 80 mg by mouth daily as needed. For fluid retention     .  gabapentin (NEURONTIN) 600 MG tablet  Take 600 mg by mouth 2 (two) times daily.     Marland Kitchen  glipiZIDE (GLUCOTROL) 5 MG tablet  Take 5 mg by mouth 2 (two) times daily before a meal.     .  lansoprazole (PREVACID) 30 MG capsule  Take 30 mg by mouth 2 (  two) times daily.     .  Multiple Vitamin (MULTIVITAMIN) tablet  Take 1 tablet by mouth daily.     Marland Kitchen  oxyCODONE (OXY IR/ROXICODONE) 5 MG immediate release tablet  Take 1 tablet (5 mg total) by mouth every 6 (six) hours as needed. For pain  30 tablet  0   .  promethazine (PHENERGAN) 25 MG tablet  Take 25 mg by mouth every 6 (six) hours as needed. For nausea     .  simvastatin (ZOCOR) 20 MG tablet  Take 20 mg by mouth every evening.     .  sitaGLIPtan-metformin (JANUMET) 50-1000 MG per tablet  Take 1 tablet by mouth 2 (two) times daily with a meal.     .  SUMAtriptan (IMITREX) 25 MG tablet  Take 25 mg by mouth every 2 (two) hours as needed. For migraines     .  telmisartan-hydrochlorothiazide (MICARDIS HCT) 80-25 MG per tablet  Take 1 tablet by mouth daily.     Marland Kitchen  topiramate (TOPAMAX) 50 MG tablet  Take 50 mg by mouth 2 (two) times daily.      Family History   Problem  Relation  Age of Onset   .  Cancer  Mother    .  Depression  Mother    .  Heart disease  Father       before age 43    .  Diabetes  Father    .   Hypertension  Father    .  Hyperlipidemia  Father    .  Heart attack  Father    .  Cancer  Maternal Grandmother     History    Social History   .  Marital Status:  Married     Spouse Name:  N/A     Number of Children:  N/A   .  Years of Education:  N/A    Occupational History   .  Not on file.    Social History Main Topics   .  Smoking status:  Former Smoker -- 2.0 packs/day for 20 years     Types:  Cigarettes     Start date:  09/30/2011     Quit date:  10/30/2011   .  Smokeless tobacco:  Never Used   .  Alcohol Use:  No   .  Drug Use:  No   .  Sexually Active:  Not on file    Other Topics  Concern   .  Not on file    Social History Narrative   .  No narrative on file   ROS: [x]  Positive [ ]  Negative [ ]  All sytems reviewed and are negative [x]  see HPI  General: [ ]  Weight loss, [ ]  Weight gain, [ ]  Loss of appetite, [ ]  Fever  Neurologic: [ ]  Dizziness, [ ]  Blackouts, [ ]  Headaches, [ ]  Seizure, Stroke, [ ]  "Mini stroke", [ ]  Slurred speech, [ ]  Temporary blindness  Ear/Nose/Throat: [ ]  Change in eyesight, [ ]  Change in hearing, [ ]  Nose bleeds, [ ]  Sore throat  Vascular: [ ]  Pain in legs with walking, [ ]  Pain in feet while lying flat, [ ]  Non-healing ulcer, [ ]  Blood clot in vein, [ ]  Phlebitis  Pulmonary: [ ]  Home oxygen, [ ]  Productive cough, [ ]  Bronchitis, [ ]  Coughing up blood,  [ ]  Asthma, [ ]  Wheezing  Musculoskeletal: [ ]  Arthritis, [ ]  Joint pain, [ ]  Muscle pain  Cardiac: [ ]   Chest pain, [ ]  Chest tightness/pressure, [ ]  Shortness of breath when lying flat, [ ]  Shortness of breath with exertion, [ ]  Palpitations, [ ]  Heart murmur, [ ]  Arrythmia,  [ ]  Atrial fibrillation  Hematologic: [ ]  Bleeding problems, [ ]  Clotting disorder, [ ]  Anemia  Psychiatric: [ ]  Depression, [ ]  Anxiety  Gastrointestinal: [ ]  Black stool,[ ]  Blood in stool, [ ]  Peptic ulcer disease, [ ]  Reflux,  [ ]  Hiatal hernia, [ ]  Trouble swallowing, [ ]  Diarrhea, [ ]  Constipation  Urinary: [ ]   Kidney disease, [ ]  Burning with urination, [ ]  nocturia, [ ]  Difficulty urinating  Endocrine: [ ]  hx diabetes, [ ]  hx thyroid disease  Skin: [ ]  Ulcers, [ ]  Rashes  PHYSICAL EXAMINATION:  There were no vitals filed for this visit.  There is no height or weight on file to calculate BMI.  General: WDWN in NAD  Gait: Normal  HENT: WNL  Eyes: PERRL  Pulmonary: normal non-labored breathing , without Rales, rhonchi, wheezing  Cardiac: RRR, without Murmurs, rubs or gallops; No carotid bruits  Abdomen: soft, NT, no masses  Skin: no rashes, ulcers noted  Vascular Exam/Pulses: left groin with purulent drainage and necrosis around the edges of the incisions. It is tender with palpation.  Extremities: without ischemic changes, no Gangrene , no cellulitis; no open wounds;  Musculoskeletal: no muscle wasting or atrophy  Neurologic: A&O X 3; Appropriate Affect ; SENSATION: normal; MOTOR FUNCTION: moving all extremities equally. Speech is fluent/normal  Non-Invasive Vascular Imaging: None  CBC    Component  Value  Date/Time    WBC  14.4*  02/23/2012 0450    RBC  3.52*  02/23/2012 0450    HGB  11.5*  02/23/2012 0450    HCT  34.9*  02/23/2012 0450    PLT  309  02/23/2012 0450    MCV  99.1  02/23/2012 0450    MCH  32.7  02/23/2012 0450    MCHC  33.0  02/23/2012 0450    RDW  13.5  02/23/2012 0450    LYMPHSABS  3.0  02/05/2007 0319    MONOABS  1.8*  02/05/2007 0319    EOSABS  0.0  02/05/2007 0319    BASOSABS  0.0  02/05/2007 0319   BMET    Component  Value  Date/Time    NA  140  02/23/2012 0450    K  4.3  02/23/2012 0450    CL  105  02/23/2012 0450    CO2  27  02/23/2012 0450    GLUCOSE  131*  02/23/2012 0450    BUN  20  02/23/2012 0450    CREATININE  1.75*  02/23/2012 0450    CALCIUM  8.8  02/23/2012 0450    GFRNONAA  32*  02/23/2012 0450    GFRAA  37*  02/23/2012 0450   ASSESSMENT/PLAN: 52 y.o. female Left common femoral, superficial femoral, profundofemoral endarterectomy with patch angioplasty (bovine) With  placement of incisional wound vac.  -she presents to the office today with a wound infection and necrosis of the wound edges.  -we will admit her to 2000 and start broad spectrum ABx  -she will need wound debridement in the OR. She has eaten today at 1pm.   [name], PA-C  [facility]  [phone]  Clinic MD Imogene Burn  Addendum  I have independently interviewed and examined the patient, and I agree with the physician assistant's findings.  L groin demonstrates superificial  skin necrosis suggests possible underlying virulent infection.  Admit to hospital for IV abx, IV pain meds, and surgical debridement of left groin tomorrow as pt already ate today.  Leonides Sake, MD [facility] [place] Office: [phone] Pager: [phone]  02/29/2012, 5:25 PM

## 2012-02-29 NOTE — Progress Notes (Signed)
Patient says she sleeps with cpap or a little 02 at nite, requested oxygen be placed on her.    02 per cannula at 2L/m now. 02 sat 96%.

## 2012-03-01 ENCOUNTER — Encounter (HOSPITAL_COMMUNITY): Payer: Self-pay | Admitting: Anesthesiology

## 2012-03-01 ENCOUNTER — Inpatient Hospital Stay (HOSPITAL_COMMUNITY): Payer: Medicare Other | Admitting: Anesthesiology

## 2012-03-01 ENCOUNTER — Encounter (HOSPITAL_COMMUNITY)
Admission: AD | Disposition: A | Discharge status: Home / Self Care | Payer: Self-pay | Source: Ambulatory Visit | Attending: Surgery

## 2012-03-01 ENCOUNTER — Inpatient Hospital Stay: Admit: 2012-03-01 | Payer: Self-pay | Admitting: Vascular Surgery

## 2012-03-01 DIAGNOSIS — Z6841 Body Mass Index (BMI) 40.0 and over, adult: Secondary | ICD-10-CM | POA: Diagnosis not present

## 2012-03-01 DIAGNOSIS — I739 Peripheral vascular disease, unspecified: Secondary | ICD-10-CM

## 2012-03-01 DIAGNOSIS — K219 Gastro-esophageal reflux disease without esophagitis: Secondary | ICD-10-CM | POA: Diagnosis not present

## 2012-03-01 DIAGNOSIS — E119 Type 2 diabetes mellitus without complications: Secondary | ICD-10-CM | POA: Diagnosis not present

## 2012-03-01 DIAGNOSIS — L988 Other specified disorders of the skin and subcutaneous tissue: Secondary | ICD-10-CM | POA: Diagnosis not present

## 2012-03-01 DIAGNOSIS — S31109A Unspecified open wound of abdominal wall, unspecified quadrant without penetration into peritoneal cavity, initial encounter: Secondary | ICD-10-CM | POA: Diagnosis not present

## 2012-03-01 HISTORY — PX: GROIN DEBRIDEMENT: SHX5159

## 2012-03-01 LAB — CBC
Hemoglobin: 12 g/dL (ref 12.0–15.0)
MCH: 32.4 pg (ref 26.0–34.0)
MCV: 97 fL (ref 78.0–100.0)
Platelets: 457 10*3/uL — ABNORMAL HIGH (ref 150–400)
RDW: 13 % (ref 11.5–15.5)

## 2012-03-01 LAB — GLUCOSE, CAPILLARY
Glucose-Capillary: 179 mg/dL — ABNORMAL HIGH (ref 70–99)
Glucose-Capillary: 194 mg/dL — ABNORMAL HIGH (ref 70–99)
Glucose-Capillary: 190 mg/dL — ABNORMAL HIGH (ref 70–99)

## 2012-03-01 LAB — BASIC METABOLIC PANEL
BUN: 23 mg/dL (ref 6–23)
CO2: 23 mEq/L (ref 19–32)
GFR calc non Af Amer: 37 mL/min — ABNORMAL LOW (ref >90)
Glucose, Bld: 188 mg/dL — ABNORMAL HIGH (ref 70–99)
Potassium: 4.2 mEq/L (ref 3.5–5.1)

## 2012-03-01 LAB — PROTIME-INR: INR: 1.08 (ref 0.00–1.49)

## 2012-03-01 SURGERY — DEBRIDEMENT, INGUINAL REGION
Anesthesia: General | Site: Leg Upper | Laterality: Left | Wound class: Dirty or Infected

## 2012-03-01 MED ORDER — HYDROMORPHONE HCL PF 1 MG/ML IJ SOLN
0.2500 mg | INTRAMUSCULAR | Status: DC | PRN
Start: 2012-03-01 — End: 2012-03-01

## 2012-03-01 MED ORDER — ONDANSETRON HCL 4 MG/2ML IJ SOLN: 4.0000 mg | Freq: Once | INTRAMUSCULAR | Status: DC | PRN

## 2012-03-01 MED ORDER — FENTANYL CITRATE 0.05 MG/ML IJ SOLN
INTRAMUSCULAR | Status: DC | PRN
  Administered 2012-03-01: 25 ug via INTRAVENOUS
  Administered 2012-03-01: 50 ug via INTRAVENOUS
  Administered 2012-03-01: 25 ug via INTRAVENOUS
  Administered 2012-03-01 (×2): 75 ug via INTRAVENOUS

## 2012-03-01 MED ORDER — 0.9 % SODIUM CHLORIDE (POUR BTL) OPTIME
TOPICAL | Status: DC | PRN
  Administered 2012-03-01: 1000 mL

## 2012-03-01 MED ORDER — CALCIUM CARBONATE 1250 (500 CA) MG PO TABS
1.0000 | ORAL_TABLET | Freq: Two times a day (BID) | ORAL | Status: DC
  Administered 2012-03-01 – 2012-03-07 (×11): 500 mg via ORAL
  Filled 2012-03-01 (×15): qty 1

## 2012-03-01 MED ORDER — INSULIN ASPART 100 UNIT/ML ~~LOC~~ SOLN
0.0000 [IU] | Freq: Three times a day (TID) | SUBCUTANEOUS | Status: DC
  Administered 2012-03-01 – 2012-03-02 (×3): 3 [IU] via SUBCUTANEOUS
  Administered 2012-03-02: 2 [IU] via SUBCUTANEOUS
  Administered 2012-03-02: 3 [IU] via SUBCUTANEOUS
  Administered 2012-03-03: 5 [IU] via SUBCUTANEOUS
  Administered 2012-03-03: 2 [IU] via SUBCUTANEOUS
  Administered 2012-03-03 – 2012-03-04 (×3): 3 [IU] via SUBCUTANEOUS
  Administered 2012-03-04: 5 [IU] via SUBCUTANEOUS
  Administered 2012-03-05: 3 [IU] via SUBCUTANEOUS
  Administered 2012-03-05: 5 [IU] via SUBCUTANEOUS
  Administered 2012-03-06 (×2): 3 [IU] via SUBCUTANEOUS
  Administered 2012-03-06: 5 [IU] via SUBCUTANEOUS
  Administered 2012-03-07 (×2): 3 [IU] via SUBCUTANEOUS

## 2012-03-01 MED ORDER — PROPOFOL 10 MG/ML IV BOLUS
INTRAVENOUS | Status: DC | PRN
  Administered 2012-03-01: 170 mg via INTRAVENOUS

## 2012-03-01 MED ORDER — LIDOCAINE HCL (CARDIAC) 20 MG/ML IV SOLN
INTRAVENOUS | Status: DC | PRN
  Administered 2012-03-01: 100 mg via INTRAVENOUS

## 2012-03-01 SURGICAL SUPPLY — 37 items
BANDAGE ELASTIC 4 VELCRO ST LF (GAUZE/BANDAGES/DRESSINGS) IMPLANT
BANDAGE ELASTIC 6 VELCRO ST LF (GAUZE/BANDAGES/DRESSINGS) IMPLANT
BANDAGE GAUZE ELAST BULKY 4 IN (GAUZE/BANDAGES/DRESSINGS) ×1 IMPLANT
CANISTER SUCTION 2500CC (MISCELLANEOUS) ×2 IMPLANT
CANISTER WOUND CARE 500ML ATS (WOUND CARE) IMPLANT
CLIP LIGATING EXTRA MED SLVR (CLIP) ×1 IMPLANT
CLIP LIGATING EXTRA SM BLUE (MISCELLANEOUS) ×1 IMPLANT
CLOTH BEACON ORANGE TIMEOUT ST (SAFETY) ×2 IMPLANT
COVER SURGICAL LIGHT HANDLE (MISCELLANEOUS) ×2 IMPLANT
DRSG PAD ABDOMINAL 8X10 ST (GAUZE/BANDAGES/DRESSINGS) ×1 IMPLANT
DRSG VAC ATS MED SENSATRAC (GAUZE/BANDAGES/DRESSINGS) IMPLANT
DRSG VAC ATS SM SENSATRAC (GAUZE/BANDAGES/DRESSINGS) IMPLANT
ELECT REM PT RETURN 9FT ADLT (ELECTROSURGICAL) ×2
ELECTRODE REM PT RTRN 9FT ADLT (ELECTROSURGICAL) ×1 IMPLANT
GLOVE SS BIOGEL STRL SZ 7.5 (GLOVE) ×1 IMPLANT
GLOVE SUPERSENSE BIOGEL SZ 7.5 (GLOVE) ×1
GOWN STRL NON-REIN LRG LVL3 (GOWN DISPOSABLE) ×5 IMPLANT
KIT BASIN OR (CUSTOM PROCEDURE TRAY) ×2 IMPLANT
NS IRRIG 1000ML POUR BTL (IV SOLUTION) ×2 IMPLANT
PACK GENERAL/GYN (CUSTOM PROCEDURE TRAY) ×1 IMPLANT
PACK SURGICAL SETUP 50X90 (CUSTOM PROCEDURE TRAY) ×1 IMPLANT
PACK UNIVERSAL I (CUSTOM PROCEDURE TRAY) ×2 IMPLANT
PAD ARMBOARD 7.5X6 YLW CONV (MISCELLANEOUS) ×4 IMPLANT
PENCIL BUTTON HOLSTER BLD 10FT (ELECTRODE) ×1 IMPLANT
SPONGE GAUZE 4X4 12PLY (GAUZE/BANDAGES/DRESSINGS) ×1 IMPLANT
SPONGE LAP 18X18 X RAY DECT (DISPOSABLE) ×1 IMPLANT
STAPLER VISISTAT 35W (STAPLE) IMPLANT
SUT ETHILON 3 0 PS 1 (SUTURE) IMPLANT
SUT VIC AB 2-0 CTX 36 (SUTURE) IMPLANT
SUT VIC AB 3-0 SH 27 (SUTURE)
SUT VIC AB 3-0 SH 27X BRD (SUTURE) IMPLANT
TAPE CLOTH SURG 4X10 WHT LF (GAUZE/BANDAGES/DRESSINGS) ×1 IMPLANT
TOWEL OR 17X24 6PK STRL BLUE (TOWEL DISPOSABLE) ×2 IMPLANT
TOWEL OR 17X26 10 PK STRL BLUE (TOWEL DISPOSABLE) ×2 IMPLANT
TUBE ANAEROBIC SPECIMEN COL (MISCELLANEOUS) ×1 IMPLANT
TUBING BULK SUCTION (MISCELLANEOUS) ×1 IMPLANT
WATER STERILE IRR 1000ML POUR (IV SOLUTION) ×1 IMPLANT

## 2012-03-01 NOTE — Interval H&P Note (Signed)
History and Physical Interval Note:  03/01/2012 8:26 AM  Stephanie Peck  has presented today for surgery, with the diagnosis of GROIN WOUND  The various methods of treatment have been discussed with the patient and family. After consideration of risks, benefits and other options for treatment, the patient has consented to  Procedure(s) (LRB) with comments: GROIN DEBRIDEMENT (Left) - VAC PLACEMENT as a surgical intervention .  The patient's history has been reviewed, patient examined, no change in status, stable for surgery.  I have reviewed the patient's chart and labs.  Questions were answered to the patient's satisfaction.     Illa Enlow

## 2012-03-01 NOTE — Op Note (Signed)
OPERATIVE REPORT  DATE OF SURGERY: 03/01/2012  PATIENT: Stephanie Peck, 52 y.o. female MRN: [id number]  DOB: 01/04/1961  PRE-OPERATIVE DIAGNOSIS: Skin and fat necrosis left groin status post left femoral endarterectomy and bovine pericardial patch  POST-OPERATIVE DIAGNOSIS:  Same  PROCEDURE: Debridement of skin and subcutaneous fat necrosis  SURGEON:  Gretta Began, M.D. ASSISTANT: Nurse  ANESTHESIA:  Gen.  EBL: Minimal ml  Total I/O In: 450 [I.V.:450] Out: 15 [Blood:15]  BLOOD ADMINISTERED: None  DRAINS: None  SPECIMEN: Aerobic and anaerobic cultures of groin  COUNTS CORRECT:  YES  PLAN OF CARE: PACU   PATIENT DISPOSITION:  PACU - hemodynamically stable  PROCEDURE DETAILS: The patient is status post left femoral endarterectomy and patch angioplasty by Dr. Myra Gianotti proximally one month ago. She is morbidly obese. She presented to our office yesterday afternoon with fat necrosis and evidence of infection in the groin. She was admitted for debridement.  The patient was placed in supine position the area the left groin was prepped and draped in usual sterile fashion. There was necrotic skin and subcutaneous fat over the extent of the incision. This was sharply debrided. The subcutaneous Vicryl sutures were also removed. There was no evidence of this extended down to the femoral artery. The deeper fat did appear to be viable. All necrotic debris was removed and the wound was irrigated with saline. The wound was packed with a Betadine gauze. She was transferred to the PACU in stable condition   Gretta Began, M.D. 03/01/2012 9:45 AM

## 2012-03-01 NOTE — Anesthesia Preprocedure Evaluation (Addendum)
Anesthesia Evaluation  Patient identified by MRN, date of birth, ID band Patient awake    Reviewed: Allergy & Precautions, H&P , NPO status , Patient's Chart, lab work & pertinent test results  Airway Mallampati: I TM Distance: >3 FB Neck ROM: full    Dental  (+) Dental Advisory Given   Pulmonary sleep apnea and Continuous Positive Airway Pressure Ventilation , COPDformer smoker,          Cardiovascular hypertension, + angina + Peripheral Vascular Disease and +CHF Rhythm:regular Rate:Normal     Neuro/Psych  Headaches, Anxiety Depression    GI/Hepatic GERD-  ,  Endo/Other  diabetes, Type 2, Oral Hypoglycemic Agents  Renal/GU      Musculoskeletal   Abdominal   Peds  Hematology   Anesthesia Other Findings   Reproductive/Obstetrics                          Anesthesia Physical Anesthesia Plan  ASA: III  Anesthesia Plan: General   Post-op Pain Management:    Induction: Intravenous  Airway Management Planned: Oral ETT and LMA  Additional Equipment:   Intra-op Plan:   Post-operative Plan: Extubation in OR  Informed Consent: I have reviewed the patients History and Physical, chart, labs and discussed the procedure including the risks, benefits and alternatives for the proposed anesthesia with the patient or authorized representative who has indicated his/her understanding and acceptance.     Plan Discussed with: CRNA, Anesthesiologist and Surgeon  Anesthesia Plan Comments:         Anesthesia Quick Evaluation

## 2012-03-01 NOTE — Transfer of Care (Signed)
Immediate Anesthesia Transfer of Care Note  Patient: Stephanie Peck  Procedure(s) Performed: Procedure(s) (LRB) with comments: GROIN DEBRIDEMENT (Left)  Patient Location: PACU  Anesthesia Type:General  Level of Consciousness: sedated  Airway & Oxygen Therapy: Patient Spontanous Breathing and Patient connected to nasal cannula oxygen  Post-op Assessment: Report given to PACU RN and Post -op Vital signs reviewed and stable  Post vital signs: Reviewed and stable  Complications: No apparent anesthesia complications

## 2012-03-01 NOTE — Plan of Care (Signed)
Problem: Diagnosis - Type of Surgery Goal: General Surgical Patient Education (See Patient Education module for education specifics)  Outcome: Completed/Met Date Met:  03/01/12 Educated patient on Debridement surgery prior to signing consent

## 2012-03-01 NOTE — Anesthesia Procedure Notes (Addendum)
Procedure Name: LMA Insertion Date/Time: 03/01/2012 8:52 AM Performed by: Alanda Amass A Pre-anesthesia Checklist: Patient identified, Timeout performed, Emergency Drugs available, Suction available and Patient being monitored Patient Re-evaluated:Patient Re-evaluated prior to inductionOxygen Delivery Method: Circle system utilized Preoxygenation: Pre-oxygenation with 100% oxygen Intubation Type: IV induction LMA: LMA inserted LMA Size: 4.0 Tube type: Oral Number of attempts: 1 Placement Confirmation: positive ETCO2 and breath sounds checked- equal and bilateral Tube secured with: Tape Dental Injury: Teeth and Oropharynx as per pre-operative assessment    Performed by: Melina Schools

## 2012-03-01 NOTE — Anesthesia Postprocedure Evaluation (Signed)
  Anesthesia Post-op Note  Patient: Stephanie Peck  Procedure(s) Performed: Procedure(s) (LRB) with comments: GROIN DEBRIDEMENT (Left)  Patient Location: PACU  Anesthesia Type:General  Level of Consciousness: awake, oriented, sedated and patient cooperative  Airway and Oxygen Therapy: Patient Spontanous Breathing  Post-op Pain: mild  Post-op Assessment: Post-op Vital signs reviewed, Patient's Cardiovascular Status Stable, Respiratory Function Stable, Patent Airway, No signs of Nausea or vomiting and Pain level controlled  Post-op Vital Signs: stable  Complications: No apparent anesthesia complications

## 2012-03-02 LAB — GLUCOSE, CAPILLARY
Glucose-Capillary: 98 mg/dL (ref 70–99)
Glucose-Capillary: 181 mg/dL — ABNORMAL HIGH (ref 70–99)

## 2012-03-03 ENCOUNTER — Encounter (HOSPITAL_COMMUNITY): Payer: Self-pay | Admitting: Vascular Surgery

## 2012-03-03 LAB — GLUCOSE, CAPILLARY
Glucose-Capillary: 212 mg/dL — ABNORMAL HIGH (ref 70–99)
Glucose-Capillary: 137 mg/dL — ABNORMAL HIGH (ref 70–99)

## 2012-03-03 LAB — WOUND CULTURE

## 2012-03-03 NOTE — Progress Notes (Signed)
[facility] [place]  Subjective  - POD #1 s/p I&D Left groin  No complaints this am   Physical Exam:  Dressing changed.  No evidence of infection, but pale appearing tissue without significant granulation.  Patch on artery is NOT exposed       Assessment/Plan:    Wound Cx's negative to date.  con't Vanco/Zpsyn until cx final Repeat labs in am to monitor Cr TID dressing changes until wound looks better, then can consider vac OOB Tid  Ernisha Sorn IV, V. WELLS 03/03/2012 7:36 AM --  Filed Vitals:   03/03/12 0432  BP: 131/62  Pulse: 83  Temp: 97.6 F (36.4 C)  Resp: 18    Intake/Output Summary (Last 24 hours) at 03/03/12 0736 Last data filed at 03/03/12 0700  Gross per 24 hour  Intake 2043.33 ml  Output   3000 ml  Net -956.67 ml     Laboratory CBC    Component Value Date/Time   WBC 14.0* 03/01/2012 0630   HGB 12.0 03/01/2012 0630   HCT 35.9* 03/01/2012 0630   PLT 457* 03/01/2012 0630    BMET    Component Value Date/Time   NA 138 03/01/2012 0630   K 4.2 03/01/2012 0630   CL 104 03/01/2012 0630   CO2 23 03/01/2012 0630   GLUCOSE 188* 03/01/2012 0630   BUN 23 03/01/2012 0630   CREATININE 1.57* 03/01/2012 0630   CALCIUM 9.5 03/01/2012 0630   GFRNONAA 37* 03/01/2012 0630   GFRAA 42* 03/01/2012 0630    COAG Lab Results  Component Value Date   INR 1.08 03/01/2012   INR 1.04 02/29/2012   INR 0.96 02/12/2012   No results found for this basename: PTT    Antibiotics Anti-infectives     Start     Dose/Rate Route Frequency Ordered Stop   02/29/12 2100   vancomycin (VANCOCIN) 1,750 mg in sodium chloride 0.9 % 500 mL IVPB        1,750 mg 250 mL/hr over 120 Minutes Intravenous Every 24 hours 02/29/12 1906     02/29/12 2000  piperacillin-tazobactam (ZOSYN) IVPB 3.375 g       3.375 g 12.5 mL/hr over 240 Minutes Intravenous 3 times per day 02/29/12 1906             Jorge Ny, M.D. [facility] [place] Office:  [phone] Pager:  [phone]

## 2012-03-03 NOTE — Progress Notes (Deleted)
[facility] [place]  Subjective  -   Wants to get up Was in chair last night abd pain resolved   Physical Exam:  Left flank ecchymosis abd soft CV:  RRR Pulm :  CTA       Assessment/Plan:  RP Hematoma  HCT stable past 24 hours D/c foley OOB with PT Possibly d/c tomorrow if ambulating and HCT unchanged  Nickolaos Brallier IV, V. WELLS 03/03/2012 7:23 AM --  Filed Vitals:   03/03/12 0432  BP: 131/62  Pulse: 83  Temp: 97.6 F (36.4 C)  Resp: 18    Intake/Output Summary (Last 24 hours) at 03/03/12 0723 Last data filed at 03/03/12 0431  Gross per 24 hour  Intake 1973.33 ml  Output   3000 ml  Net -1026.67 ml     Laboratory CBC    Component Value Date/Time   WBC 14.0* 03/01/2012 0630   HGB 12.0 03/01/2012 0630   HCT 35.9* 03/01/2012 0630   PLT 457* 03/01/2012 0630    BMET    Component Value Date/Time   NA 138 03/01/2012 0630   K 4.2 03/01/2012 0630   CL 104 03/01/2012 0630   CO2 23 03/01/2012 0630   GLUCOSE 188* 03/01/2012 0630   BUN 23 03/01/2012 0630   CREATININE 1.57* 03/01/2012 0630   CALCIUM 9.5 03/01/2012 0630   GFRNONAA 37* 03/01/2012 0630   GFRAA 42* 03/01/2012 0630    COAG Lab Results  Component Value Date   INR 1.08 03/01/2012   INR 1.04 02/29/2012   INR 0.96 02/12/2012   No results found for this basename: PTT    Antibiotics Anti-infectives     Start     Dose/Rate Route Frequency Ordered Stop   02/29/12 2100   vancomycin (VANCOCIN) 1,750 mg in sodium chloride 0.9 % 500 mL IVPB        1,750 mg 250 mL/hr over 120 Minutes Intravenous Every 24 hours 02/29/12 1906     02/29/12 2000  piperacillin-tazobactam (ZOSYN) IVPB 3.375 g       3.375 g 12.5 mL/hr over 240 Minutes Intravenous 3 times per day 02/29/12 1906             Jorge Ny, M.D. [facility] [place] Office: [phone] Pager:  [phone]

## 2012-03-04 LAB — BASIC METABOLIC PANEL
BUN: 21 mg/dL (ref 6–23)
Chloride: 104 mEq/L (ref 96–112)
GFR calc Af Amer: 42 mL/min — ABNORMAL LOW (ref >90)
Glucose, Bld: 185 mg/dL — ABNORMAL HIGH (ref 70–99)
Potassium: 4.1 mEq/L (ref 3.5–5.1)

## 2012-03-04 LAB — CBC
HCT: 42.4 % (ref 36.0–46.0)
Hemoglobin: 11.8 g/dL — ABNORMAL LOW (ref 12.0–15.0)
WBC: 10.6 10*3/uL — ABNORMAL HIGH (ref 4.0–10.5)

## 2012-03-04 LAB — GLUCOSE, CAPILLARY
Glucose-Capillary: 183 mg/dL — ABNORMAL HIGH (ref 70–99)
Glucose-Capillary: 162 mg/dL — ABNORMAL HIGH (ref 70–99)

## 2012-03-04 NOTE — Progress Notes (Signed)
Pt wet to dry dressing changed per hospital, pt tolerated well.  

## 2012-03-04 NOTE — Progress Notes (Addendum)
[facility] Progress Note  03/04/2012 7:49 AM POD 3  Subjective:  States she is feeling better.  Still sore, but different than pre op debridement.  Afebrile x 24 hrs VSS  Filed Vitals:   03/04/12 0547  BP: 106/63  Pulse: 81  Temp: 97.5 F (36.4 C)  Resp: 16    Physical Exam: Incisions:  Left groin incision packing removed-there is some granulation tissue around the superior portion   CBC    Component Value Date/Time   WBC 10.6* 03/04/2012 0450   RBC 3.67* 03/04/2012 0450   HGB 11.8* 03/04/2012 0450   HCT 42.4 03/04/2012 0450   PLT 520* 03/04/2012 0450   MCV 115.5* 03/04/2012 0450   MCH 32.2 03/04/2012 0450   MCHC 27.8* 03/04/2012 0450   RDW 18.7* 03/04/2012 0450   LYMPHSABS 3.0 02/05/2007 0319   MONOABS 1.8* 02/05/2007 0319   EOSABS 0.0 02/05/2007 0319   BASOSABS 0.0 02/05/2007 0319    BMET    Component Value Date/Time   NA 138 03/04/2012 0450   K 4.1 03/04/2012 0450   CL 104 03/04/2012 0450   CO2 23 03/04/2012 0450   GLUCOSE 185* 03/04/2012 0450   BUN 21 03/04/2012 0450   CREATININE 1.58* 03/04/2012 0450   CALCIUM 9.2 03/04/2012 0450   GFRNONAA 36* 03/04/2012 0450   GFRAA 42* 03/04/2012 0450    INR    Component Value Date/Time   INR 1.08 03/01/2012 0630     Intake/Output Summary (Last 24 hours) at 03/04/12 0749 Last data filed at 03/04/12 0400  Gross per 24 hour  Intake   1170 ml  Output   2801 ml  Net  -1631 ml   Culture on day of admission 02/29/12: Specimen Description WOUND LEFT GROIN  Special Requests NONE  Gram Stain FEW WBC PRESENT,BOTH PMN AND MONONUCLEAR RARE SQUAMOUS EPITHELIAL CELLS PRESENT MODERATE GRAM NEGATIVE RODS MODERATE GRAM NEGATIVE COCCOBACILLI RARE GRAM POSITIVE RODS  Culture MULTIPLE ORGANISMS PRESENT, NONE PREDOMINANT Note: NO STAPHYLOCOCCUS AUREUS ISOLATED NO GROUP A STREP (S.PYOGENES) ISOLATED  Culture 03/01/12 (intraop): Specimen Description WOUND LEFT GROIN  Special Requests PT ON VANCO AND ZOSYN  Gram Stain NO WBC SEEN NO SQUAMOUS  EPITHELIAL CELLS SEEN NO ORGANISMS SEEN Culture MULTIPLE ORGANISMS PRESENT, NONE PREDOMINANT Note: NO STAPHYLOCOCCUS AUREUS ISOLATED NO GROUP A STREP (S.PYOGENES) ISOLATED  Anaerobic culture 03/01/12 (intraop): Specimen Description WOUND LEFT GROIN  Special Requests PT ON VANCO AND ZOSYN  Gram Stain NO WBC SEEN NO SQUAMOUS EPITHELIAL CELLS SEEN NO ORGANISMS SEEN Culture NO ANAEROBES ISOLATED; CULTURE IN PROGRESS FOR 5 DAYS Report Status PENDING  Assessment/Plan:  52 y.o. female is s/p:  I&D left groin  POD 3  -no evidence of infection -continue tid wet to dry dressing changes.  Possibly place vac later this week. -Cx on day of admission revealed moderate GNR and moderate GN coccobacilli as well as rare GPR-final report is still pending. -pt is afebrile and WBC continues to improve-continue ABx until final results from Cx -Creatinine stable   [name], PA-C [facility] [phone] 03/04/2012 7:49 AM

## 2012-03-04 NOTE — Progress Notes (Signed)
Pt wet to dry dressing changed per hospital, pt tolerated well.

## 2012-03-04 NOTE — Progress Notes (Addendum)
ANTIBIOTIC CONSULT NOTE   Pharmacy Consult for  Vancomycin and Zosyn Indication:  Left groin wound infection  Allergies  Allergen Reactions  . Codeine Nausea Only   Labs:  Basename 03/04/12 0450  WBC 10.6*  HGB 11.8*  PLT 520*  LABCREA --  CREATININE 1.58*    Assessment: 52 year old female s/p I+D of left groin (Pod 3), currently afebrile with stable renal function.  Zosyn and Vancomycin Day 5 of therapy  Cultures without Staph aureus, no predominant species  Goal of Therapy:  Vancomycin trough level 10-15 mcg/ml  Plan:  Continue Vancomycin and Zosyn at current doses Follow up for discontinuation of antibiotics / change to po  Thank you. Okey Regal, PharmD [phone]  03/04/2012,9:40 AM

## 2012-03-05 LAB — GLUCOSE, CAPILLARY
Glucose-Capillary: 175 mg/dL — ABNORMAL HIGH (ref 70–99)
Glucose-Capillary: 111 mg/dL — ABNORMAL HIGH (ref 70–99)

## 2012-03-05 NOTE — Clinical Documentation Improvement (Signed)
EXCISIONAL DEBRIDEMENT DOCUMENTATION CLARIFICATION  THIS DOCUMENT IS NOT A PERMANENT PART OF THE MEDICAL RECORD  TO RESPOND TO THE THIS QUERY, FOLLOW THE INSTRUCTIONS BELOW:  1. If needed, update documentation for the patient's encounter via the notes activity.  2. Access this query again and click edit on the In [employer].  3. After updating, or not, click F2 to complete all highlighted (required) fields concerning your review. Select "additional documentation in the medical record" OR "no additional documentation provided".  4. Click Sign note button.  5. The deficiency will fall out of your In Basket *Please let us know if you are not able to complete this workflow by phone or e-mail (listed below).  Please update your documentation within the medical record to reflect your response to this query.                                                                                        03/05/12  Dear Dr. [facility] [place] [facility]  Because that is documentation in the medical record of "debridement" clarification is needed.  Please document the below (4) key elements in a progress note:  1.  Type of Debridement:          Excisional Debridement-  Cutting away necrotic, devitalized tissue or slough to  Level of viable tissue using a sharp instrument (example, scalpel, scissors, etc).           NON Excisional Debridement-  The removal of necrotic, devitalized tissue or slough by means of scraping, mechanical brushing, flushing, or washing. (example: irrigation, whirlpool);  Minor removal of loose fragments  2.  Please indicate instrument used:  Scissors, Scalpel, Curette, or other  3.  Please document depth of the debridement:             Partial Thickness  Full Thickness  Skin and Subcutaneous Tissue  Subcutaneous Tissue and Muscle  Subcutaneous Tissue, Muscle and Bone  Other  4.  Document the size of the debridement.  You may use possible, probable, or  suspect with inpatient documentation. possible, probable, suspected diagnoses MUST be documented at the time of discharge  In a better effort to capture your patient's severity of illness, reflect appropriate length of stay and utilization of resources, a review of the patient medical record has revealed the following indicators. Based on your clinical judgment, please clarify and document in a progress note and/or discharge summary the clinical condition associated with the following supporting information:In responding to this query please exercise your independent judgment.  The fact that a query is asked, does not imply that any particular answer is desired or expected.  Reviewed: additional documentation in the medical record  Thank You,  Amada Kingfisher  RN, BSN, CCM  Clinical Documentation Specialist: Health Information Management [facility] [phone] [email or web address]     This patient had an excisional debridement using scissors and scalpel. This was skin and subcutaneous tissue. The size was approximately 2 x 5 cm.

## 2012-03-05 NOTE — Care Management Note (Signed)
    Page 1 of 2   03/07/2012     3:45:09 PM   CARE MANAGEMENT NOTE 03/07/2012  Patient:  Stephanie Peck, Stephanie Peck   Account Number:  [id number]  Date Initiated:  03/05/2012  Documentation initiated by:  Icey Tello  Subjective/Objective Assessment:   PT ADM 02/29/12 WITH INFECTED LT GROIN REQUIRING I&D IN OR. PTA, PT INDEPENDENT, LIVES WITH HUSBAND.     Action/Plan:   MET WITH PT TO DISCUSS DC PLANS.  VAC TO BE PLACED POSSIBLY LATER IN THE WEEK.  PT WISHES TO GO HOME WITH HH CARE AND HUSBAND.  WILL FOLLOW FOR HOME NEEDS. RECOMMEND P.T. CONSULT.   Anticipated DC Date:  03/09/2012   Anticipated DC Plan:  HOME W HOME HEALTH SERVICES      DC Planning Services  CM consult      [facility] Choice  HOME HEALTH   Choice offered to / List presented to:  C-1 Patient   DME arranged  NEGATIVE PRESSURE WOUND DEVICE      DME agency  [facility]     [facility] arranged  HH-1 RN      [facility] agency  [facility]   Status of service:  Completed, signed off Medicare Important Message given?   (If response is "NO", the following Medicare IM given date fields will be blank) Date Medicare IM given:   Date Additional Medicare IM given:    Discharge Disposition:  HOME W HOME HEALTH SERVICES  Per UR Regulation:  Reviewed for med. necessity/level of care/duration of stay  If discussed at Long Length of Stay Meetings, dates discussed:    Comments:  03/07/12 Rosalita Chessman [phone] PT FOR DC HOME TODAY.  WILL DC HOME WITH WET TO DRY DRSG; [facility] TO FOLLOW AT HOME TOMORROW TO APPLY NEG PRESSURE WOUND DEVICE.

## 2012-03-05 NOTE — Progress Notes (Signed)
Placed patient on CPAP via auto-mode with a minimum pressure of 5cm and a maximum pressure of 20cm. Oxygen was placed inline at 2lpm with Sp02=97% ]

## 2012-03-05 NOTE — Progress Notes (Signed)
Agree with above Superficial fat necrosis debrided at bedside Continue wet to dry dressing changes Dr. Arbie Cookey to see tomorrow, I will re-evaluate on Friday.  At that time I will make the decision to send her home with wet to dry dressing changes or a wound vac  Wells Brabham

## 2012-03-06 LAB — GLUCOSE, CAPILLARY
Glucose-Capillary: 171 mg/dL — ABNORMAL HIGH (ref 70–99)
Glucose-Capillary: 151 mg/dL — ABNORMAL HIGH (ref 70–99)

## 2012-03-06 LAB — ANAEROBIC CULTURE: Gram Stain: NONE SEEN

## 2012-03-06 NOTE — Progress Notes (Signed)
Subjective: Interval History: none. Comfortable.   Objective: Vital signs in last 24 hours: Temp:  [97.5 F (36.4 C)-98 F (36.7 C)] 97.7 F (36.5 C) (02/06 0428) Pulse Rate:  [74-100] 74  (02/06 0428) Resp:  [18-20] 20  (02/06 0428) BP: (119-129)/(44-60) 119/44 mmHg (02/06 0428) SpO2:  [97 %-100 %] 100 % (02/06 0428)  Intake/Output from previous day: 02/05 0701 - 02/06 0700 In: 720 [P.O.:720] Out: 1301 [Urine:1300; Stool:1] Intake/Output this shift:    Left groin wound with better granulation tissue at the base  Lab Results:  [facility] 03/04/12 0450  WBC 10.6*  HGB 11.8*  HCT 42.4  PLT 520*   BMET  Basename 03/04/12 0450  NA 138  K 4.1  CL 104  CO2 23  GLUCOSE 185*  BUN 21  CREATININE 1.58*  CALCIUM 9.2    Studies/Results: Dg Chest Port 1 View  02/22/2012  *RADIOLOGY REPORT*  Clinical Data: Postop endarterectomy.  PORTABLE CHEST - 1 VIEW  Comparison: 01/07/2012.  Findings: The heart is enlarged.  The mediastinal and hilar contours are slightly prominent but unchanged.  There is central vascular congestion and bibasilar atelectasis.  Possible small left effusion.  No pneumothorax.  IMPRESSION: Cardiac enlargement with vascular congestion, bibasilar atelectasis and probable small left effusion.   Original Report Authenticated By: Rudie Meyer, M.D.    Anti-infectives: Anti-infectives     Start     Dose/Rate Route Frequency Ordered Stop   02/29/12 2100   vancomycin (VANCOCIN) 1,750 mg in sodium chloride 0.9 % 500 mL IVPB        1,750 mg 250 mL/hr over 120 Minutes Intravenous Every 24 hours 02/29/12 1906     02/29/12 2000   piperacillin-tazobactam (ZOSYN) IVPB 3.375 g        3.375 g 12.5 mL/hr over 240 Minutes Intravenous 3 times per day 02/29/12 1906            Assessment/Plan: s/p Procedure(s) (LRB) with comments: GROIN DEBRIDEMENT (Left) Continue wet to dry saline. Possible VAC dressing tomorrow   LOS: 6 days   Chelsea Pedretti 03/06/2012, 6:38  AM

## 2012-03-06 NOTE — Progress Notes (Signed)
Rt groin dressing change wet / dry

## 2012-03-06 NOTE — Progress Notes (Signed)
ANTIBIOTIC CONSULT NOTE - FOLLOW UP  Pharmacy Consult for Vancomycin and Zosyn Indication: Left groin wound infection  Allergies  Allergen Reactions  . Codeine Nausea Only    Patient Measurements: Height: 5\' 7"  (170.2 cm) Weight: 276 lb 3.2 oz (125.283 kg) IBW/kg (Calculated) : 61.6   Vital Signs: Temp: 98 F (36.7 C) (02/06 1948) Temp src: Oral (02/06 1948) BP: 128/67 mmHg (02/06 1948) Pulse Rate: 70  (02/06 1948) Intake/Output from previous day: 02/05 0701 - 02/06 0700 In: 720 [P.O.:720] Out: 1301 [Urine:1300; Stool:1] Intake/Output from this shift:    Labs:  [facility] 03/04/12 0450  WBC 10.6*  HGB 11.8*  PLT 520*  LABCREA --  CREATININE 1.58*   Estimated Creatinine Clearance: 56.6 ml/min (by C-G formula based on Cr of 1.58).  Basename 03/06/12 2001  VANCOTROUGH 16.7     Microbiology: Recent Results (from the past 720 hour(s))  SURGICAL PCR SCREEN     Status: Normal   Collection Time   02/12/12  8:33 AM      Component Value Range Status Comment   MRSA, PCR NEGATIVE  NEGATIVE Final    Staphylococcus aureus NEGATIVE  NEGATIVE Final   URINE CULTURE     Status: Normal   Collection Time   02/12/12 10:00 AM      Component Value Range Status Comment   Specimen Description URINE, CLEAN CATCH   Final    Special Requests NONE   Final    Culture  Setup Time 02/12/2012 11:57   Final    Colony Count 45,000 COLONIES/ML   Final    Culture     Final    Value: Multiple bacterial morphotypes present, none predominant. Suggest appropriate recollection if clinically indicated.   Report Status 02/13/2012 FINAL   Final   WOUND CULTURE     Status: Normal   Collection Time   02/29/12  6:51 PM      Component Value Range Status Comment   Specimen Description WOUND LEFT GROIN   Final    Special Requests NONE   Final    Gram Stain     Final    Value: FEW WBC PRESENT,BOTH PMN AND MONONUCLEAR     RARE SQUAMOUS EPITHELIAL CELLS PRESENT     MODERATE GRAM NEGATIVE RODS   MODERATE GRAM NEGATIVE COCCOBACILLI     RARE GRAM POSITIVE RODS   Culture     Final    Value: MULTIPLE ORGANISMS PRESENT, NONE PREDOMINANT     Note: NO STAPHYLOCOCCUS AUREUS ISOLATED NO GROUP A STREP (S.PYOGENES) ISOLATED   Report Status 03/03/2012 FINAL   Final   URINE CULTURE     Status: Normal   Collection Time   02/29/12  6:53 PM      Component Value Range Status Comment   Specimen Description URINE, CLEAN CATCH   Final    Special Requests NONE   Final    Culture  Setup Time 02/29/2012 19:27   Final    Colony Count NO GROWTH   Final    Culture NO GROWTH   Final    Report Status 03/02/2012 FINAL   Final   WOUND CULTURE     Status: Normal   Collection Time   03/01/12  9:23 AM      Component Value Range Status Comment   Specimen Description WOUND LEFT GROIN   Final    Special Requests PT ON VANCO AND ZOSYN   Final    Gram Stain     Final  Value: NO WBC SEEN     NO SQUAMOUS EPITHELIAL CELLS SEEN     NO ORGANISMS SEEN   Culture     Final    Value: MULTIPLE ORGANISMS PRESENT, NONE PREDOMINANT     Note: NO STAPHYLOCOCCUS AUREUS ISOLATED NO GROUP A STREP (S.PYOGENES) ISOLATED   Report Status 03/03/2012 FINAL   Final   ANAEROBIC CULTURE     Status: Normal   Collection Time   03/01/12  9:23 AM      Component Value Range Status Comment   Specimen Description WOUND LEFT GROIN   Final    Special Requests PT ON VANCO AND ZOSYN   Final    Gram Stain     Final    Value: NO WBC SEEN     NO SQUAMOUS EPITHELIAL CELLS SEEN     NO ORGANISMS SEEN   Culture NO ANAEROBES ISOLATED   Final    Report Status 03/06/2012 FINAL   Final     Anti-infectives     Start     Dose/Rate Route Frequency Ordered Stop   02/29/12 2100   vancomycin (VANCOCIN) 1,750 mg in sodium chloride 0.9 % 500 mL IVPB        1,750 mg 250 mL/hr over 120 Minutes Intravenous Every 24 hours 02/29/12 1906     02/29/12 2000  piperacillin-tazobactam (ZOSYN) IVPB 3.375 g       3.375 g 12.5 mL/hr over 240 Minutes Intravenous 3  times per day 02/29/12 1906            Assessment: 52 yo F on day #7 of antibiotic therapy with Vancomycin and Zosyn for L groin wound infection.  Vancomycin trough is slightly above goal.  Anticipate true trough is within therapeutic range.  Renal function remains stable with CrCl ~ 50.  Goal of Therapy:  Vancomycin trough level 10-15 mcg/ml  Plan:  Continue Vancomycin 1750 mg IV q24 hours. Continue Zosyn 3.375 gm IV q8h (4 hour infusion). Fllow-up plans re: length of therapy vs. switch to oral antibiotics.  [employer], Pharm.D., BCPS Clinical Pharmacist Pager [phone] 03/06/2012 9:19 PM

## 2012-03-06 NOTE — Progress Notes (Signed)
Placed pt on cpap. Pt. Is on auto titrate tolerating well at this time. Pt. States she wore cpap lastnight with the same settings and that it worked well for her. RT will continue to monitor pt.

## 2012-03-06 NOTE — Evaluation (Signed)
Physical Therapy Evaluation Patient Details Name: Stephanie Peck MRN: [id number] DOB: August 20, 1960 Today's Date: 03/06/2012 Time: 8295-6213 PT Time Calculation (min): 14 min  PT Assessment / Plan / Recommendation Clinical Impression  pt rpesents with L groin wound.  pt moving better than baseline per pt and is ModI for all mobility in hospital.  Discussed with pt going to OPPT after D/C to continue to work on balance.  Encouraged pt to continue ambulating and no further need for acute PT.  Will sign off.      PT Assessment  All further PT needs can be met in the next venue of care    Follow Up Recommendations  Outpatient PT    Does the patient have the potential to tolerate intense rehabilitation      Barriers to Discharge        Equipment Recommendations  None recommended by PT    Recommendations for Other Services     Frequency      Precautions / Restrictions Precautions Precautions: None Restrictions Weight Bearing Restrictions: No   Pertinent Vitals/Pain Indicates only minimal pain in L groin.        Mobility  Bed Mobility Bed Mobility: Supine to Sit;Sitting - Scoot to Edge of Bed Supine to Sit: 6: Modified independent (Device/Increase time) Sitting - Scoot to Edge of Bed: 6: Modified independent (Device/Increase time) Details for Bed Mobility Assistance: pt moves slowly, but no A needed.   Transfers Transfers: Sit to Stand;Stand to Sit Sit to Stand: 6: Modified independent (Device/Increase time);With upper extremity assist;From bed Stand to Sit: 6: Modified independent (Device/Increase time);With upper extremity assist;To toilet Details for Transfer Assistance: Again moves slowly, but no A needed.   Ambulation/Gait Ambulation/Gait Assistance: 6: Modified independent (Device/Increase time) Ambulation Distance (Feet): 200 Feet Assistive device: Straight cane Ambulation/Gait Assistance Details: pt holds cane in L UE and moves at same time as L LE.  pt immediately  states she knows this is wrong, but has had so much trouble ttrying to coordinate the cane the proper way.  pt moves slowly and has moderate lateral sway.  pt indicates that she ahs done that since the groin wound started.   Gait Pattern: Step-through pattern;Lateral trunk lean to right;Lateral trunk lean to left Stairs: No Wheelchair Mobility Wheelchair Mobility: No    Exercises     PT Diagnosis:    PT Problem List: Decreased activity tolerance;Decreased balance;Decreased knowledge of use of DME PT Treatment Interventions:     PT Goals    Visit Information  Last PT Received On: 03/06/12 Assistance Needed: +1    Subjective Data  Subjective: Since the surgery, I can move easier, it doesn't hurt as bad.   Patient Stated Goal: Home   Prior Functioning  Home Living Lives With: Spouse Available Help at Discharge: Family;Available 24 hours/day Type of Home: House Home Access: Ramped entrance Home Layout: One level Home Adaptive Equipment: Straight cane Prior Function Level of Independence: Independent with assistive device(s) Able to Take Stairs?: Yes Communication Communication: No difficulties    Cognition  Cognition Overall Cognitive Status: Appears within functional limits for tasks assessed/performed Arousal/Alertness: Awake/alert Orientation Level: Appears intact for tasks assessed Behavior During Session: [facility] for tasks performed    Extremity/Trunk Assessment Right Lower Extremity Assessment RLE ROM/Strength/Tone: [facility] for tasks assessed RLE Sensation: WFL - Light Touch Left Lower Extremity Assessment LLE ROM/Strength/Tone: WFL for tasks assessed LLE Sensation: WFL - Light Touch Trunk Assessment Trunk Assessment: Normal   Balance Balance Balance Assessed: Yes High  Level Balance High Level Balance Activites: Side stepping;Direction changes;Turns;Sudden stops;Head turns  End of Session PT - End of Session Equipment Utilized During Treatment: Gait belt Activity  Tolerance: Patient tolerated treatment well Patient left: in bed;with call bell/phone within reach Nurse Communication: Mobility status  GP     Sunny Schlein, [place] [phone] 03/06/2012, 9:12 AM

## 2012-03-07 ENCOUNTER — Encounter: Payer: Self-pay | Admitting: Surgery

## 2012-03-07 ENCOUNTER — Telehealth: Payer: Self-pay | Admitting: Surgery

## 2012-03-07 LAB — GLUCOSE, CAPILLARY
Glucose-Capillary: 161 mg/dL — ABNORMAL HIGH (ref 70–99)
Glucose-Capillary: 151 mg/dL — ABNORMAL HIGH (ref 70–99)

## 2012-03-07 MED ORDER — LEVOFLOXACIN 500 MG PO TABS: 500.0000 mg | ORAL_TABLET | Freq: Every day | ORAL | Status: DC

## 2012-03-07 MED ORDER — INDOMETHACIN 50 MG PO CAPS: 50.0000 mg | ORAL_CAPSULE | Freq: Two times a day (BID) | ORAL | Status: DC

## 2012-03-07 NOTE — Discharge Summary (Signed)
[facility] Discharge Summary  Stephanie Peck January 08, 1961 52 y.o. female  [id number]  Admission Date: 02/29/2012  Discharge Date: 03/07/12  Physician: Nada Libman, MD  Admission Diagnosis: Infection left groin wound GROIN WOUND   HPI:   This is a 52 y.o. female Who underwent left CFA, SFA and profundofemoral endarterectomy with patch angioplasty (bovine). She did have an incisional wound vac that was removed on POD 1 and dermabond was placed on her incision. She was given instructions on meticulous wound care before discharge. She presents today with pain and foul smelling odor of her left groin incision. She denies fevers, but states that she has just been feeling bad.  Hospital Course:  The patient was admitted to the hospital and was placed on IV ABx.  She also had a wound culture, which revealed the following: MULTIPLE ORGANISMS PRESENT, NONE PREDOMINANT Note: NO STAPHYLOCOCCUS AUREUS ISOLATED NO GROUP A STREP (S.PYOGENES) ISOLATED  She also underwent tid wet to dry saline dressing changes.  Her wound has continued to improve.  She is discharged home with a 10 day course of Levaquin.  The remainder of the hospital course consisted of increasing mobilization and increasing intake of solids without difficulty.  CBC    Component Value Date/Time   WBC 10.6* 03/04/2012 0450   RBC 3.67* 03/04/2012 0450   HGB 11.8* 03/04/2012 0450   HCT 42.4 03/04/2012 0450   PLT 520* 03/04/2012 0450   MCV 115.5* 03/04/2012 0450   MCH 32.2 03/04/2012 0450   MCHC 27.8* 03/04/2012 0450   RDW 18.7* 03/04/2012 0450   LYMPHSABS 3.0 02/05/2007 0319   MONOABS 1.8* 02/05/2007 0319   EOSABS 0.0 02/05/2007 0319   BASOSABS 0.0 02/05/2007 0319    BMET    Component Value Date/Time   NA 138 03/04/2012 0450   K 4.1 03/04/2012 0450   CL 104 03/04/2012 0450   CO2 23 03/04/2012 0450   GLUCOSE 185* 03/04/2012 0450   BUN 21 03/04/2012 0450   CREATININE 1.58* 03/04/2012 0450   CALCIUM 9.2 03/04/2012 0450   GFRNONAA 36*  03/04/2012 0450   GFRAA 42* 03/04/2012 0450     Discharge Instructions:   The patient is discharged to home with extensive instructions on wound care and progressive ambulation.  They are instructed not to drive or perform any heavy lifting until returning to see the physician in his office.  Discharge Orders    Future Appointments: Provider: Department: Dept Phone: Center:   03/10/2012 8:45 AM Nada Libman, MD [facility] [phone] [facility]     Future Orders Please Complete By Expires   Resume previous diet      Driving Restrictions      Comments:   No driving until coming back to see Dr. Myra Gianotti   Lifting restrictions      Comments:   No lifting for 6 weeks   Call MD for:  temperature >100.5      Call MD for:  redness, tenderness, or signs of infection (pain, swelling, bleeding, redness, odor or green/yellow discharge around incision site)      Call MD for:  severe or increased pain, loss or decreased feeling  in affected limb(s)         Discharge Diagnosis:  Infection left groin wound GROIN WOUND  Secondary Diagnosis: Patient Active Problem List  Diagnosis  . DIABETES MELLITUS, TYPE II  . OBESITY  . HYPERTENSION  . UNSTABLE ANGINA  . CONGESTIVE HEART FAILURE  .  DIASTOLIC DYSFUNCTION  . GERD  . SLEEP APNEA, MILD  . HEADACHE, CHRONIC  . Leg pain, bilateral  . Low back pain  . Bilateral knee pain  . Hip pain, bilateral  . Atherosclerosis of native arteries of the extremities with intermittent claudication  . Peripheral vascular disease, unspecified  . Pain in the groin   Past Medical History  Diagnosis Date  . Diabetes mellitus   . GERD (gastroesophageal reflux disease)   . Anxiety   . Depression   . CHF (congestive heart failure)   . COPD (chronic obstructive pulmonary disease)   . Sleep apnea     severe OSA by 09/08/06 sleep study [name])      Demetric, [place]  Home Medication Instructions [id number]   Printed on:03/07/12  1452  Medication Information                    furosemide (LASIX) 80 MG tablet Take 80 mg by mouth daily as needed. For fluid retention           lansoprazole (PREVACID) 30 MG capsule Take 30 mg by mouth 2 (two) times daily.           promethazine (PHENERGAN) 25 MG tablet Take 25 mg by mouth every 6 (six) hours as needed. For nausea           DULoxetine (CYMBALTA) 60 MG capsule Take 60 mg by mouth daily.           sitaGLIPtan-metformin (JANUMET) 50-1000 MG per tablet Take 1 tablet by mouth 2 (two) times daily with a meal.           telmisartan-hydrochlorothiazide (MICARDIS HCT) 80-25 MG per tablet Take 1 tablet by mouth daily.           simvastatin (ZOCOR) 20 MG tablet Take 20 mg by mouth every evening.           buPROPion (WELLBUTRIN XL) 300 MG 24 hr tablet Take 300 mg by mouth daily.           topiramate (TOPAMAX) 50 MG tablet Take 50 mg by mouth 2 (two) times daily.           SUMAtriptan (IMITREX) 25 MG tablet Take 25 mg by mouth every 2 (two) hours as needed. For migraines           Multiple Vitamin (MULTIVITAMIN) tablet Take 1 tablet by mouth daily.           ferrous fumarate (HEMOCYTE - 106 MG FE) 325 (106 FE) MG TABS Take 1 tablet by mouth.           calcium carbonate (OS-CAL) 600 MG TABS Take 600 mg by mouth 2 (two) times daily with a meal.           clopidogrel (PLAVIX) 75 MG tablet Take 1 tablet by mouth daily.           glipiZIDE (GLUCOTROL) 5 MG tablet Take 5 mg by mouth 2 (two) times daily before a meal.           gabapentin (NEURONTIN) 600 MG tablet Take 600 mg by mouth 2 (two) times daily.            albuterol (PROVENTIL HFA;VENTOLIN HFA) 108 (90 BASE) MCG/ACT inhaler Inhale 2 puffs into the lungs every 6 (six) hours as needed. For wheezing and shortness of breath.           albuterol (PROVENTIL) (5 MG/ML) 0.5% nebulizer solution Take 2.5 mg  by nebulization every 6 (six) hours as needed. For wheezing and shortness of breath.           oxyCODONE  (OXY IR/ROXICODONE) 5 MG immediate release tablet Take 1 tablet (5 mg total) by mouth every 6 (six) hours as needed. For pain           levofloxacin (LEVAQUIN) 500 MG tablet Take 1 tablet (500 mg total) by mouth daily. #10 NR (sent directly to pharmacy via EPIC)            Disposition: [facility]  Patient's condition: is Good  Follow up: 1. Dr. Myra Gianotti in 2 weeks   [name], PA-C [facility] [phone] 03/07/2012  2:52 PM

## 2012-03-07 NOTE — Progress Notes (Signed)
[facility] Progress Note  03/07/2012 8:01 AM POD 8    afebrile  Filed Vitals:   03/07/12 0406  BP: 130/64  Pulse: 72  Temp: 97.6 F (36.4 C)  Resp: 18     CBC    Component Value Date/Time   WBC 10.6* 03/04/2012 0450   RBC 3.67* 03/04/2012 0450   HGB 11.8* 03/04/2012 0450   HCT 42.4 03/04/2012 0450   PLT 520* 03/04/2012 0450   MCV 115.5* 03/04/2012 0450   MCH 32.2 03/04/2012 0450   MCHC 27.8* 03/04/2012 0450   RDW 18.7* 03/04/2012 0450   LYMPHSABS 3.0 02/05/2007 0319   MONOABS 1.8* 02/05/2007 0319   EOSABS 0.0 02/05/2007 0319   BASOSABS 0.0 02/05/2007 0319    BMET    Component Value Date/Time   NA 138 03/04/2012 0450   K 4.1 03/04/2012 0450   CL 104 03/04/2012 0450   CO2 23 03/04/2012 0450   GLUCOSE 185* 03/04/2012 0450   BUN 21 03/04/2012 0450   CREATININE 1.58* 03/04/2012 0450   CALCIUM 9.2 03/04/2012 0450   GFRNONAA 36* 03/04/2012 0450   GFRAA 42* 03/04/2012 0450    INR    Component Value Date/Time   INR 1.08 03/01/2012 0630     Intake/Output Summary (Last 24 hours) at 03/07/12 0801 Last data filed at 03/06/12 2100  Gross per 24 hour  Intake   1440 ml  Output    701 ml  Net    739 ml     Assessment/Plan:  52 y.o. female is s/p:  left CFA, SFA and profundofemoral endarterectomy with patch angioplasty (bovine) and readmitted with left groin wound infection  HD 8  -will check wound later today and evaluate for wound VAC  [name], PA-C [facility] [phone] 03/07/2012 8:01 AM

## 2012-03-07 NOTE — Progress Notes (Signed)
Discharged to home with family office visits in place teaching done  

## 2012-03-07 NOTE — Telephone Encounter (Addendum)
Message copied by Shari Prows on Fri Mar 07, 2012  3:38 PM ------      Message from: Sharee Pimple      Created: Fri Mar 07, 2012  1:46 PM      Regarding: schedule                   ----- Message -----         From: Dara Lords, PA         Sent: 03/07/2012   1:40 PM           To: Sharee Pimple, CMA            S/p left groin wound infection.  F/u with VB in 1 week.            Thanks,      Lelon Mast  Per Samantha's phone call this pt can keep scheduled appt for Monday 03/10/12 at 8:45am. She will let pt know about appt as she is being discharged from [facility] today/awt

## 2012-03-08 DIAGNOSIS — F341 Dysthymic disorder: Secondary | ICD-10-CM | POA: Diagnosis not present

## 2012-03-08 DIAGNOSIS — I509 Heart failure, unspecified: Secondary | ICD-10-CM | POA: Diagnosis not present

## 2012-03-08 DIAGNOSIS — T8189XA Other complications of procedures, not elsewhere classified, initial encounter: Secondary | ICD-10-CM | POA: Diagnosis not present

## 2012-03-08 DIAGNOSIS — E119 Type 2 diabetes mellitus without complications: Secondary | ICD-10-CM | POA: Diagnosis not present

## 2012-03-08 DIAGNOSIS — J449 Chronic obstructive pulmonary disease, unspecified: Secondary | ICD-10-CM | POA: Diagnosis not present

## 2012-03-08 DIAGNOSIS — R5381 Other malaise: Secondary | ICD-10-CM | POA: Diagnosis not present

## 2012-03-09 ENCOUNTER — Encounter (HOSPITAL_COMMUNITY): Payer: Self-pay | Admitting: Nurse Practitioner

## 2012-03-09 ENCOUNTER — Emergency Department (HOSPITAL_COMMUNITY)
Admission: EM | Admit: 2012-03-09 | Discharge: 2012-03-09 | Disposition: A | Discharge status: Home / Self Care | Payer: Medicare Other | Attending: Emergency Medicine | Admitting: Emergency Medicine

## 2012-03-09 DIAGNOSIS — I509 Heart failure, unspecified: Secondary | ICD-10-CM | POA: Diagnosis not present

## 2012-03-09 DIAGNOSIS — Z8659 Personal history of other mental and behavioral disorders: Secondary | ICD-10-CM | POA: Insufficient documentation

## 2012-03-09 DIAGNOSIS — IMO0001 Reserved for inherently not codable concepts without codable children: Secondary | ICD-10-CM

## 2012-03-09 DIAGNOSIS — J449 Chronic obstructive pulmonary disease, unspecified: Secondary | ICD-10-CM | POA: Insufficient documentation

## 2012-03-09 DIAGNOSIS — G4733 Obstructive sleep apnea (adult) (pediatric): Secondary | ICD-10-CM | POA: Insufficient documentation

## 2012-03-09 DIAGNOSIS — E119 Type 2 diabetes mellitus without complications: Secondary | ICD-10-CM | POA: Diagnosis not present

## 2012-03-09 DIAGNOSIS — Z87891 Personal history of nicotine dependence: Secondary | ICD-10-CM | POA: Diagnosis not present

## 2012-03-09 DIAGNOSIS — Z4801 Encounter for change or removal of surgical wound dressing: Secondary | ICD-10-CM | POA: Diagnosis not present

## 2012-03-09 DIAGNOSIS — J4489 Other specified chronic obstructive pulmonary disease: Secondary | ICD-10-CM | POA: Insufficient documentation

## 2012-03-09 DIAGNOSIS — Z79899 Other long term (current) drug therapy: Secondary | ICD-10-CM | POA: Diagnosis not present

## 2012-03-09 DIAGNOSIS — K219 Gastro-esophageal reflux disease without esophagitis: Secondary | ICD-10-CM | POA: Insufficient documentation

## 2012-03-09 NOTE — ED Notes (Addendum)
PER  Nurse first - DR. Dixon will see PT to change dsy.

## 2012-03-09 NOTE — ED Notes (Signed)
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 Home health nurse was supposed to place wound vac last night but told the pt the wound has gotten worse and " now has tunneling." dr Durwin Nora vascular doc spoke to home health nurse and told pt to come to er to see him

## 2012-03-09 NOTE — ED Provider Notes (Signed)
History     CSN: [id number]  Arrival date & time 03/09/12  1025   First MD Initiated Contact with Patient 03/09/12 1134      Chief Complaint  Patient presents with  . Wound Check    (Consider location/radiation/quality/duration/timing/severity/associated sxs/prior treatment) HPI Comments: Patient told to come here by vascular surgery for wound check.  She is to see Dr. Durwin Nora.  Otherwise no complaints.    The history is provided by the patient.    Past Medical History  Diagnosis Date  . Diabetes mellitus   . GERD (gastroesophageal reflux disease)   . Anxiety   . Depression   . CHF (congestive heart failure)   . COPD (chronic obstructive pulmonary disease)   . Sleep apnea     severe OSA by 09/08/06 sleep study [name])    Past Surgical History  Procedure Laterality Date  . Cholecystectomy    . Gastric bypass  2006  . Tonsillectomy    . Femoral artery stent  12/05/2011    right superficial   . Endarterectomy femoral  02/22/2012    Procedure: ENDARTERECTOMY FEMORAL;  Surgeon: Nada Libman, MD;  Location: [facility] OR;  Service: Vascular;  Laterality: Left;  . Patch angioplasty  02/22/2012    Procedure: PATCH ANGIOPLASTY;  Surgeon: Nada Libman, MD;  Location: [facility] OR;  Service: Vascular;  Laterality: Left;  left femoral patch angioplasty  . Application of wound vac  02/22/2012    Procedure: APPLICATION OF WOUND VAC;  Surgeon: Nada Libman, MD;  Location: [facility] OR;  Service: Vascular;  Laterality: Left;  application wound vac  . Groin debridement  03/01/2012    Procedure: GROIN DEBRIDEMENT;  Surgeon: Larina Earthly, MD;  Location: [facility] OR;  Service: Vascular;  Laterality: Left;    Family History  Problem Relation Age of Onset  . Cancer Mother   . Depression Mother   . Heart disease Father     before age 18  . Diabetes Father   . Hypertension Father   . Hyperlipidemia Father   . Heart attack Father   . Cancer Maternal Grandmother     History  Substance Use Topics  . Smoking  status: Former Smoker -- 2.00 packs/day for 20 years    Types: Cigarettes    Start date: 09/30/2011    Quit date: 10/30/2011  . Smokeless tobacco: Never Used  . Alcohol Use: No    OB History   Grav Para Term Preterm Abortions TAB SAB Ect Mult Living                  Review of Systems  All other systems reviewed and are negative.    Allergies  Codeine  Home Medications   Current Outpatient Rx  Name  Route  Sig  Dispense  Refill  . albuterol (PROVENTIL HFA;VENTOLIN HFA) 108 (90 BASE) MCG/ACT inhaler   Inhalation   Inhale 2 puffs into the lungs every 6 (six) hours as needed. For wheezing and shortness of breath.         Marland Kitchen albuterol (PROVENTIL) (5 MG/ML) 0.5% nebulizer solution   Nebulization   Take 2.5 mg by nebulization every 6 (six) hours as needed. For wheezing and shortness of breath.         Marland Kitchen buPROPion (WELLBUTRIN XL) 300 MG 24 hr tablet   Oral   Take 300 mg by mouth daily.         . calcium carbonate (OS-CAL) 600 MG TABS   Oral  Take 600 mg by mouth 2 (two) times daily with a meal.         . clopidogrel (PLAVIX) 75 MG tablet   Oral   Take 1 tablet by mouth daily.         . DULoxetine (CYMBALTA) 60 MG capsule   Oral   Take 60 mg by mouth daily.         . ferrous fumarate (HEMOCYTE - 106 MG FE) 325 (106 FE) MG TABS   Oral   Take 1 tablet by mouth.         . gabapentin (NEURONTIN) 600 MG tablet   Oral   Take 600 mg by mouth 2 (two) times daily.          Marland Kitchen glipiZIDE (GLUCOTROL) 5 MG tablet   Oral   Take 5 mg by mouth 2 (two) times daily before a meal.         . indomethacin (INDOCIN) 50 MG capsule   Oral   Take 50 mg by mouth 2 (two) times daily with a meal. Will begin today         . lansoprazole (PREVACID) 30 MG capsule   Oral   Take 30 mg by mouth 2 (two) times daily.         Marland Kitchen levofloxacin (LEVAQUIN) 500 MG tablet   Oral   Take 500 mg by mouth daily. Will start today         . Multiple Vitamin (MULTIVITAMIN)  tablet   Oral   Take 1 tablet by mouth daily.         Marland Kitchen oxycodone (OXY-IR) 5 MG capsule   Oral   Take 5 mg by mouth every 6 (six) hours as needed. For pain         . promethazine (PHENERGAN) 25 MG tablet   Oral   Take 25 mg by mouth every 6 (six) hours as needed. For nausea         . simvastatin (ZOCOR) 20 MG tablet   Oral   Take 20 mg by mouth every evening.         . sitaGLIPtan-metformin (JANUMET) 50-1000 MG per tablet   Oral   Take 1 tablet by mouth 2 (two) times daily with a meal.         . telmisartan-hydrochlorothiazide (MICARDIS HCT) 80-25 MG per tablet   Oral   Take 1 tablet by mouth daily.         Marland Kitchen topiramate (TOPAMAX) 50 MG tablet   Oral   Take 50 mg by mouth 2 (two) times daily.           BP 116/68  Pulse 91  Temp(Src) 97.1 F (36.2 C) (Oral)  Resp 20  SpO2 100%  Physical Exam  Nursing note and vitals reviewed. Constitutional: She is oriented to person, place, and time. She appears well-developed and well-nourished. No distress.  HENT:  Head: Normocephalic and atraumatic.  Neck: Normal range of motion. Neck supple.  Neurological: She is alert and oriented to person, place, and time.  Skin: Skin is warm and dry. She is not diaphoretic.    ED Course  Procedures (including critical care time)  Labs Reviewed - No data to display No results found.   No diagnosis found.    MDM  Patient stable.  Wound addressed by Dr. Durwin Nora.  No other complaints.  Follow up as per Dr. Darletta Moll instructions.          Geoffery Lyons, MD  03/09/12 1202 

## 2012-03-09 NOTE — Consult Note (Signed)
VASCULAR SURGERY: This is a pleasant 52 year old woman who underwent a left common femoral artery and superficial femoral artery endarterectomy with bovine pericardial patch angioplasty on 02/22/2012. She is morbidly obese. She developed a wound problem and the wound was debrided in the operating room on 03/01/2012. She was in the hospital for a week and then discharged with home health doing dressing changes. The home health nurse came to her house last night with plans to place a VAC. However, she did not feel comfortable proceeding and she had probed the wound and "saw something shiny" at the base of the wound. I spoke with a home health nurse over the phone today when she called and given her concern I felt that the patient should be evaluated in the emergency department. I suspicion was that she had some exposed bovine pericardial patch in the base of the wound. Patient denies any significant pain in the wound except for the tape from the dressing changes. She denies fever or chills. She has not had any significant drainage.  Physical exam  Temperature 97.1 heart rate is 91 blood pressure is 116/68. I inspected the wound in the left groin. There is good granulation tissue around 60% of the wound. There is adipose tissue at the base of the wound. I do not see any evidence of exposed bovine pericardial patch or native artery. I could see part of the Vicryl suture from the deep layer closure.  I redressed the wound with hydrogel and moist 4 x 4's.  I have asked her to keep her appointment which had been scheduled for tomorrow with Dr. Myra Gianotti. Pending his approval I think it would be reasonable to proceed with placement of the [facility].  Cari Caraway Beeper [phone] 03/09/2012

## 2012-03-10 ENCOUNTER — Ambulatory Visit (INDEPENDENT_AMBULATORY_CARE_PROVIDER_SITE_OTHER): Payer: Medicare Other | Admitting: Surgery

## 2012-03-10 ENCOUNTER — Encounter: Payer: Self-pay | Admitting: Surgery

## 2012-03-10 VITALS — BP 104/62 | HR 104 | Temp 97.5°F | Ht 67.0 in | Wt 276.6 lb

## 2012-03-10 DIAGNOSIS — I70219 Atherosclerosis of native arteries of extremities with intermittent claudication, unspecified extremity: Secondary | ICD-10-CM

## 2012-03-10 NOTE — Progress Notes (Signed)
The patient is status post left femoral endarterectomy with patch angioplasty. She was readmitted for a left groin incisional breakdown. She was taken to the operating room for debridement. There was good tissue coverage over the patch, which could not be visualized. The majority was patent necrosis. She was kept in the hospital for approximately 5 days for dressing changes. She did undergo debridement at the bedside for additional necrotic fat. She was discharged to home this past Friday. The plan was for her to place a wound VAC. However, went home health saw the wound on Saturday they were uncomfortable with this and wanted her to go to the emergency department. She was evaluated in the emergency department. At that time, the wound looked as it did the day prior.  I again examined the wound today. There is healthy granulation tissue within the wound. The graft is not exposed. There is fat within the wound.  I feel that it is safe to proceed with placing a wound VAC at -75 pressure. She will followup with me in one month. I gave her 40 oxycodone 5 mg tablets today

## 2012-03-11 DIAGNOSIS — R5381 Other malaise: Secondary | ICD-10-CM | POA: Diagnosis not present

## 2012-03-11 DIAGNOSIS — J449 Chronic obstructive pulmonary disease, unspecified: Secondary | ICD-10-CM | POA: Diagnosis not present

## 2012-03-11 DIAGNOSIS — E119 Type 2 diabetes mellitus without complications: Secondary | ICD-10-CM | POA: Diagnosis not present

## 2012-03-11 DIAGNOSIS — T8189XA Other complications of procedures, not elsewhere classified, initial encounter: Secondary | ICD-10-CM | POA: Diagnosis not present

## 2012-03-11 DIAGNOSIS — I509 Heart failure, unspecified: Secondary | ICD-10-CM | POA: Diagnosis not present

## 2012-03-11 DIAGNOSIS — F341 Dysthymic disorder: Secondary | ICD-10-CM | POA: Diagnosis not present

## 2012-03-14 ENCOUNTER — Telehealth: Payer: Self-pay | Admitting: *Deleted

## 2012-03-14 DIAGNOSIS — E119 Type 2 diabetes mellitus without complications: Secondary | ICD-10-CM | POA: Diagnosis not present

## 2012-03-14 DIAGNOSIS — F341 Dysthymic disorder: Secondary | ICD-10-CM | POA: Diagnosis not present

## 2012-03-14 DIAGNOSIS — I509 Heart failure, unspecified: Secondary | ICD-10-CM | POA: Diagnosis not present

## 2012-03-14 DIAGNOSIS — J449 Chronic obstructive pulmonary disease, unspecified: Secondary | ICD-10-CM | POA: Diagnosis not present

## 2012-03-14 DIAGNOSIS — R5383 Other fatigue: Secondary | ICD-10-CM | POA: Diagnosis not present

## 2012-03-14 DIAGNOSIS — T8189XA Other complications of procedures, not elsewhere classified, initial encounter: Secondary | ICD-10-CM | POA: Diagnosis not present

## 2012-03-14 NOTE — Telephone Encounter (Signed)
Called [facility] to instruct setting of vac to 70.

## 2012-03-14 NOTE — Discharge Summary (Signed)
Agree with above Will place wound vac and have her follow up with me in 3 days in the office  Stephanie Peck

## 2012-03-14 NOTE — Telephone Encounter (Signed)
Message copied by Shanon Rosser on Fri Mar 14, 2012  1:18 PM ------      Message from: Melene Plan      Created: Fri Mar 14, 2012  1:11 PM      Regarding: FW: Wound Vac Setting       DO YOU KNOW ANYTHING ABOUT THIS?SINCE [facility] ISN'T HERE CAN YOU TAKE CARE OF IT?      THANKS      JUDY      ----- Message -----         From: Nada Libman, MD         Sent: 03/14/2012   1:34 AM           To: Erenest Blank, RN      Subject: RE: Wound Vac Setting                                    70      ----- Message -----         From: Erenest Blank, RN         Sent: 03/12/2012  10:51 AM           To: Nada Libman, MD      Subject: Wound Vac Setting                                        Per [facility] RN, the setting choices for Wound Vac are "70 or 80".  You wrote order for "75".  Which pressure setting do you want?             ------

## 2012-03-15 ENCOUNTER — Other Ambulatory Visit: Payer: Self-pay

## 2012-03-17 DIAGNOSIS — I509 Heart failure, unspecified: Secondary | ICD-10-CM | POA: Diagnosis not present

## 2012-03-17 DIAGNOSIS — R5383 Other fatigue: Secondary | ICD-10-CM | POA: Diagnosis not present

## 2012-03-17 DIAGNOSIS — F341 Dysthymic disorder: Secondary | ICD-10-CM | POA: Diagnosis not present

## 2012-03-17 DIAGNOSIS — T8189XA Other complications of procedures, not elsewhere classified, initial encounter: Secondary | ICD-10-CM | POA: Diagnosis not present

## 2012-03-17 DIAGNOSIS — E119 Type 2 diabetes mellitus without complications: Secondary | ICD-10-CM | POA: Diagnosis not present

## 2012-03-17 DIAGNOSIS — J449 Chronic obstructive pulmonary disease, unspecified: Secondary | ICD-10-CM | POA: Diagnosis not present

## 2012-03-19 DIAGNOSIS — J449 Chronic obstructive pulmonary disease, unspecified: Secondary | ICD-10-CM | POA: Diagnosis not present

## 2012-03-19 DIAGNOSIS — F341 Dysthymic disorder: Secondary | ICD-10-CM | POA: Diagnosis not present

## 2012-03-19 DIAGNOSIS — R5383 Other fatigue: Secondary | ICD-10-CM | POA: Diagnosis not present

## 2012-03-19 DIAGNOSIS — E119 Type 2 diabetes mellitus without complications: Secondary | ICD-10-CM | POA: Diagnosis not present

## 2012-03-19 DIAGNOSIS — I509 Heart failure, unspecified: Secondary | ICD-10-CM | POA: Diagnosis not present

## 2012-03-19 DIAGNOSIS — T8189XA Other complications of procedures, not elsewhere classified, initial encounter: Secondary | ICD-10-CM | POA: Diagnosis not present

## 2012-03-19 DIAGNOSIS — R5381 Other malaise: Secondary | ICD-10-CM | POA: Diagnosis not present

## 2012-03-21 DIAGNOSIS — E119 Type 2 diabetes mellitus without complications: Secondary | ICD-10-CM | POA: Diagnosis not present

## 2012-03-21 DIAGNOSIS — I509 Heart failure, unspecified: Secondary | ICD-10-CM | POA: Diagnosis not present

## 2012-03-21 DIAGNOSIS — F341 Dysthymic disorder: Secondary | ICD-10-CM | POA: Diagnosis not present

## 2012-03-21 DIAGNOSIS — J449 Chronic obstructive pulmonary disease, unspecified: Secondary | ICD-10-CM | POA: Diagnosis not present

## 2012-03-21 DIAGNOSIS — T8189XA Other complications of procedures, not elsewhere classified, initial encounter: Secondary | ICD-10-CM | POA: Diagnosis not present

## 2012-03-21 DIAGNOSIS — R5381 Other malaise: Secondary | ICD-10-CM | POA: Diagnosis not present

## 2012-03-24 ENCOUNTER — Telehealth: Payer: Self-pay | Admitting: *Deleted

## 2012-03-24 DIAGNOSIS — T8189XA Other complications of procedures, not elsewhere classified, initial encounter: Secondary | ICD-10-CM | POA: Diagnosis not present

## 2012-03-24 DIAGNOSIS — R5383 Other fatigue: Secondary | ICD-10-CM | POA: Diagnosis not present

## 2012-03-24 DIAGNOSIS — F341 Dysthymic disorder: Secondary | ICD-10-CM | POA: Diagnosis not present

## 2012-03-24 DIAGNOSIS — I509 Heart failure, unspecified: Secondary | ICD-10-CM | POA: Diagnosis not present

## 2012-03-24 DIAGNOSIS — E119 Type 2 diabetes mellitus without complications: Secondary | ICD-10-CM | POA: Diagnosis not present

## 2012-03-24 DIAGNOSIS — J449 Chronic obstructive pulmonary disease, unspecified: Secondary | ICD-10-CM | POA: Diagnosis not present

## 2012-03-24 NOTE — Telephone Encounter (Signed)
Patient requesting levaquin for yeast infection. Per Dr Myra Gianotti call in Diflucan 200mg  po for three days.  I called in RX to [facility] [phone].

## 2012-03-26 DIAGNOSIS — R5383 Other fatigue: Secondary | ICD-10-CM | POA: Diagnosis not present

## 2012-03-26 DIAGNOSIS — J449 Chronic obstructive pulmonary disease, unspecified: Secondary | ICD-10-CM | POA: Diagnosis not present

## 2012-03-26 DIAGNOSIS — T8189XA Other complications of procedures, not elsewhere classified, initial encounter: Secondary | ICD-10-CM | POA: Diagnosis not present

## 2012-03-26 DIAGNOSIS — F341 Dysthymic disorder: Secondary | ICD-10-CM | POA: Diagnosis not present

## 2012-03-26 DIAGNOSIS — I509 Heart failure, unspecified: Secondary | ICD-10-CM | POA: Diagnosis not present

## 2012-03-26 DIAGNOSIS — E119 Type 2 diabetes mellitus without complications: Secondary | ICD-10-CM | POA: Diagnosis not present

## 2012-03-28 DIAGNOSIS — I509 Heart failure, unspecified: Secondary | ICD-10-CM | POA: Diagnosis not present

## 2012-03-28 DIAGNOSIS — J449 Chronic obstructive pulmonary disease, unspecified: Secondary | ICD-10-CM | POA: Diagnosis not present

## 2012-03-28 DIAGNOSIS — R5381 Other malaise: Secondary | ICD-10-CM | POA: Diagnosis not present

## 2012-03-28 DIAGNOSIS — F341 Dysthymic disorder: Secondary | ICD-10-CM | POA: Diagnosis not present

## 2012-03-28 DIAGNOSIS — E119 Type 2 diabetes mellitus without complications: Secondary | ICD-10-CM | POA: Diagnosis not present

## 2012-03-28 DIAGNOSIS — T8189XA Other complications of procedures, not elsewhere classified, initial encounter: Secondary | ICD-10-CM | POA: Diagnosis not present

## 2012-03-31 ENCOUNTER — Telehealth: Payer: Self-pay

## 2012-03-31 ENCOUNTER — Encounter: Payer: Self-pay | Admitting: Surgery

## 2012-03-31 ENCOUNTER — Inpatient Hospital Stay (HOSPITAL_COMMUNITY)
Admission: AD | Admit: 2012-03-31 | Discharge: 2012-04-09 | DRG: 254 | Disposition: A | Discharge status: Home / Self Care | Payer: Medicare Other | Source: Ambulatory Visit | Attending: Surgery | Admitting: Surgery

## 2012-03-31 ENCOUNTER — Other Ambulatory Visit: Payer: Self-pay | Admitting: Thoracic Diseases

## 2012-03-31 ENCOUNTER — Inpatient Hospital Stay (HOSPITAL_COMMUNITY): Payer: Medicare Other

## 2012-03-31 ENCOUNTER — Ambulatory Visit (INDEPENDENT_AMBULATORY_CARE_PROVIDER_SITE_OTHER): Payer: Medicare Other | Admitting: Surgery

## 2012-03-31 VITALS — BP 96/62 | HR 93 | Temp 98.2°F | Resp 16 | Ht 67.0 in | Wt 280.0 lb

## 2012-03-31 DIAGNOSIS — F341 Dysthymic disorder: Secondary | ICD-10-CM | POA: Diagnosis not present

## 2012-03-31 DIAGNOSIS — T82598A Other mechanical complication of other cardiac and vascular devices and implants, initial encounter: Secondary | ICD-10-CM | POA: Diagnosis not present

## 2012-03-31 DIAGNOSIS — I739 Peripheral vascular disease, unspecified: Secondary | ICD-10-CM | POA: Diagnosis not present

## 2012-03-31 DIAGNOSIS — I743 Embolism and thrombosis of arteries of the lower extremities: Secondary | ICD-10-CM | POA: Diagnosis not present

## 2012-03-31 DIAGNOSIS — J4489 Other specified chronic obstructive pulmonary disease: Secondary | ICD-10-CM | POA: Diagnosis present

## 2012-03-31 DIAGNOSIS — M79609 Pain in unspecified limb: Secondary | ICD-10-CM | POA: Diagnosis not present

## 2012-03-31 DIAGNOSIS — G4733 Obstructive sleep apnea (adult) (pediatric): Secondary | ICD-10-CM | POA: Diagnosis present

## 2012-03-31 DIAGNOSIS — R5381 Other malaise: Secondary | ICD-10-CM | POA: Diagnosis not present

## 2012-03-31 DIAGNOSIS — J449 Chronic obstructive pulmonary disease, unspecified: Secondary | ICD-10-CM | POA: Diagnosis present

## 2012-03-31 DIAGNOSIS — I509 Heart failure, unspecified: Secondary | ICD-10-CM | POA: Diagnosis not present

## 2012-03-31 DIAGNOSIS — K219 Gastro-esophageal reflux disease without esophagitis: Secondary | ICD-10-CM | POA: Diagnosis present

## 2012-03-31 DIAGNOSIS — T82898A Other specified complication of vascular prosthetic devices, implants and grafts, initial encounter: Secondary | ICD-10-CM | POA: Diagnosis not present

## 2012-03-31 DIAGNOSIS — E119 Type 2 diabetes mellitus without complications: Secondary | ICD-10-CM | POA: Diagnosis present

## 2012-03-31 DIAGNOSIS — T8189XA Other complications of procedures, not elsewhere classified, initial encounter: Secondary | ICD-10-CM | POA: Diagnosis not present

## 2012-03-31 DIAGNOSIS — Y849 Medical procedure, unspecified as the cause of abnormal reaction of the patient, or of later complication, without mention of misadventure at the time of the procedure: Secondary | ICD-10-CM | POA: Diagnosis present

## 2012-03-31 DIAGNOSIS — I999 Unspecified disorder of circulatory system: Secondary | ICD-10-CM | POA: Diagnosis not present

## 2012-03-31 LAB — URINALYSIS, ROUTINE W REFLEX MICROSCOPIC
Bilirubin Urine: NEGATIVE
Glucose, UA: NEGATIVE mg/dL
Ketones, ur: NEGATIVE mg/dL
Nitrite: NEGATIVE
Specific Gravity, Urine: 1.012 (ref 1.005–1.030)
pH: 5.5 (ref 5.0–8.0)

## 2012-03-31 LAB — PROTIME-INR: INR: 1.03 (ref 0.00–1.49)

## 2012-03-31 LAB — COMPREHENSIVE METABOLIC PANEL
ALT: 12 U/L (ref 0–35)
AST: 10 U/L (ref 0–37)
Albumin: 3.1 g/dL — ABNORMAL LOW (ref 3.5–5.2)
CO2: 23 mEq/L (ref 19–32)
Chloride: 104 mEq/L (ref 96–112)
Creatinine, Ser: 1.55 mg/dL — ABNORMAL HIGH (ref 0.50–1.10)
GFR calc non Af Amer: 37 mL/min — ABNORMAL LOW (ref >90)
Sodium: 138 mEq/L (ref 135–145)
Total Bilirubin: 0.1 mg/dL — ABNORMAL LOW (ref 0.3–1.2)

## 2012-03-31 LAB — CBC
Platelets: 379 10*3/uL (ref 150–400)
RBC: 3.7 MIL/uL — ABNORMAL LOW (ref 3.87–5.11)
RDW: 13.7 % (ref 11.5–15.5)
WBC: 11.7 10*3/uL — ABNORMAL HIGH (ref 4.0–10.5)

## 2012-03-31 LAB — GLUCOSE, CAPILLARY
Glucose-Capillary: 132 mg/dL — ABNORMAL HIGH (ref 70–99)
Glucose-Capillary: 106 mg/dL — ABNORMAL HIGH (ref 70–99)

## 2012-03-31 LAB — URINE MICROSCOPIC-ADD ON

## 2012-03-31 MED ORDER — OXYCODONE HCL 5 MG PO TABS
5.0000 mg | ORAL_TABLET | ORAL | Status: DC | PRN
  Administered 2012-03-31 – 2012-04-01 (×4): 10 mg via ORAL
  Filled 2012-03-31 (×4): qty 2

## 2012-03-31 MED ORDER — LABETALOL HCL 5 MG/ML IV SOLN
10.0000 mg | INTRAVENOUS | Status: DC | PRN
  Filled 2012-03-31: qty 4

## 2012-03-31 MED ORDER — SODIUM CHLORIDE 0.9 % IJ SOLN: 3.0000 mL | Freq: Two times a day (BID) | INTRAMUSCULAR | Status: DC

## 2012-03-31 MED ORDER — HYDRALAZINE HCL 20 MG/ML IJ SOLN
10.0000 mg | INTRAMUSCULAR | Status: DC | PRN
  Filled 2012-03-31: qty 0.5

## 2012-03-31 MED ORDER — IOHEXOL 350 MG/ML SOLN
100.0000 mL | Freq: Once | INTRAVENOUS | Status: AC | PRN
Start: 2012-03-31 — End: 2012-03-31
  Administered 2012-03-31: 100 mL via INTRAVENOUS

## 2012-03-31 MED ORDER — PANTOPRAZOLE SODIUM 40 MG PO TBEC
40.0000 mg | DELAYED_RELEASE_TABLET | Freq: Every day | ORAL | Status: DC
  Administered 2012-04-01: 40 mg via ORAL
  Filled 2012-03-31: qty 1

## 2012-03-31 MED ORDER — ONDANSETRON HCL 4 MG/2ML IJ SOLN: 4.0000 mg | Freq: Four times a day (QID) | INTRAMUSCULAR | Status: DC | PRN

## 2012-03-31 MED ORDER — METOPROLOL TARTRATE 1 MG/ML IV SOLN: 2.0000 mg | INTRAVENOUS | Status: DC | PRN

## 2012-03-31 MED ORDER — SODIUM CHLORIDE 0.9 % IV SOLN: 250.0000 mL | INTRAVENOUS | Status: DC | PRN

## 2012-03-31 MED ORDER — GUAIFENESIN-DM 100-10 MG/5ML PO SYRP: 15.0000 mL | ORAL_SOLUTION | ORAL | Status: DC | PRN

## 2012-03-31 MED ORDER — INSULIN ASPART 100 UNIT/ML ~~LOC~~ SOLN
0.0000 [IU] | Freq: Three times a day (TID) | SUBCUTANEOUS | Status: DC
  Administered 2012-04-01: 2 [IU] via SUBCUTANEOUS
  Administered 2012-04-01: 5 [IU] via SUBCUTANEOUS
  Administered 2012-04-01: 2 [IU] via SUBCUTANEOUS

## 2012-03-31 MED ORDER — MORPHINE SULFATE 2 MG/ML IJ SOLN
2.0000 mg | INTRAMUSCULAR | Status: DC | PRN
  Administered 2012-03-31: 2 mg via INTRAVENOUS
  Filled 2012-03-31 (×2): qty 2

## 2012-03-31 MED ORDER — SODIUM CHLORIDE 0.9 % IJ SOLN: 3.0000 mL | INTRAMUSCULAR | Status: DC | PRN

## 2012-03-31 MED ORDER — ACETAMINOPHEN 650 MG RE SUPP: 325.0000 mg | RECTAL | Status: DC | PRN

## 2012-03-31 MED ORDER — ACETAMINOPHEN 325 MG PO TABS: 325.0000 mg | ORAL_TABLET | ORAL | Status: DC | PRN

## 2012-03-31 MED ORDER — PHENOL 1.4 % MT LIQD: 1.0000 | OROMUCOSAL | Status: DC | PRN

## 2012-03-31 MED ORDER — DEXTROSE-NACL 5-0.45 % IV SOLN
INTRAVENOUS | Status: DC
  Administered 2012-03-31: 22:00:00 via INTRAVENOUS

## 2012-03-31 NOTE — Progress Notes (Signed)
[facility] [place]  Progress Note Bypass Surgery  Date of Surgery: Jan 2014 Left common femoral, superficial femoral, profundofemoral endarterectomy with patch angioplasty (bovine)  Feb 1 ,2014 Debridement of skin and subcutaneous fat necrosis  Surgeon: Vivi Martens, MD  History of Present Illness  Stephanie Peck is a 52 y.o. female who is S/P  Left fem endarterectomy and then debridement left groin wound. she had been home and having wound vac changes Q M,W,F. Nurse today noted increased brownish drainage from the wound like old blood. No active bright red bleading. Pt notes the left leg is now numb and has been that way since this am. She denies pain in the leg and has good motion. She states her toes become ruberous when in the dependent position.   Past Medical History  Diagnosis Date  . Diabetes mellitus   . GERD (gastroesophageal reflux disease)   . Anxiety   . Depression   . CHF (congestive heart failure)   . COPD (chronic obstructive pulmonary disease)   . Sleep apnea     severe OSA by 09/08/06 sleep study [name])   Past Surgical History  Procedure Laterality Date  . Cholecystectomy    . Gastric bypass  2006  . Tonsillectomy    . Femoral artery stent  12/05/2011    right superficial   . Endarterectomy femoral  02/22/2012    Procedure: ENDARTERECTOMY FEMORAL;  Surgeon: Nada Libman, MD;  Location: [facility] OR;  Service: Vascular;  Laterality: Left;  . Patch angioplasty  02/22/2012    Procedure: PATCH ANGIOPLASTY;  Surgeon: Nada Libman, MD;  Location[facility] OR;  Service: Vascular;  Laterality: Left;  left femoral patch angioplasty  . Application of wound vac  02/22/2012    Procedure: APPLICATION OF WOUND VAC;  Surgeon: Nada Libman, MD;  Location: [facility] OR;  Service: Vascular;  Laterality: Left;  application wound vac  . Groin debridement  03/01/2012    Procedure: GROIN DEBRIDEMENT;  Surgeon: Larina Earthly, MD;  Location: [facility] OR;  Service: Vascular;  Laterality:  Left;   Current Outpatient Prescriptions  Medication Sig Dispense Refill  . albuterol (PROVENTIL HFA;VENTOLIN HFA) 108 (90 BASE) MCG/ACT inhaler Inhale 2 puffs into the lungs every 6 (six) hours as needed. For wheezing and shortness of breath.      Marland Kitchen albuterol (PROVENTIL) (5 MG/ML) 0.5% nebulizer solution Take 2.5 mg by nebulization every 6 (six) hours as needed. For wheezing and shortness of breath.      Marland Kitchen buPROPion (WELLBUTRIN XL) 300 MG 24 hr tablet Take 300 mg by mouth daily.      . calcium carbonate (OS-CAL) 600 MG TABS Take 600 mg by mouth 2 (two) times daily with a meal.      . clopidogrel (PLAVIX) 75 MG tablet Take 1 tablet by mouth daily.      . DULoxetine (CYMBALTA) 60 MG capsule Take 60 mg by mouth daily.      . ferrous fumarate (HEMOCYTE - 106 MG FE) 325 (106 FE) MG TABS Take 1 tablet by mouth.      . gabapentin (NEURONTIN) 600 MG tablet Take 600 mg by mouth 2 (two) times daily.       Marland Kitchen glipiZIDE (GLUCOTROL) 5 MG tablet Take 5 mg by mouth 2 (two) times daily before a meal.      . indomethacin (INDOCIN) 50 MG capsule Take 50 mg by mouth 2 (two) times daily with a meal. Will begin today      .  lansoprazole (PREVACID) 30 MG capsule Take 30 mg by mouth 2 (two) times daily.      Marland Kitchen levofloxacin (LEVAQUIN) 500 MG tablet Take 500 mg by mouth daily. Will start today      . Multiple Vitamin (MULTIVITAMIN) tablet Take 1 tablet by mouth daily.      Marland Kitchen oxycodone (OXY-IR) 5 MG capsule Take 5 mg by mouth every 6 (six) hours as needed. For pain      . promethazine (PHENERGAN) 25 MG tablet Take 25 mg by mouth every 6 (six) hours as needed. For nausea      . simvastatin (ZOCOR) 20 MG tablet Take 20 mg by mouth every evening.      . sitaGLIPtan-metformin (JANUMET) 50-1000 MG per tablet Take 1 tablet by mouth 2 (two) times daily with a meal.      . telmisartan-hydrochlorothiazide (MICARDIS HCT) 80-25 MG per tablet Take 1 tablet by mouth daily.      Marland Kitchen topiramate (TOPAMAX) 50 MG tablet Take 50 mg by mouth  2 (two) times daily.       No current facility-administered medications for this visit.     Physical Examination  BP Readings from Last 3 Encounters:  03/31/12 96/62  03/10/12 104/62  03/09/12 116/68   Temp Readings from Last 3 Encounters:  03/31/12 98.2 F (36.8 C) Oral  03/10/12 97.5 F (36.4 C) Oral  03/09/12 97.1 F (36.2 C) Oral   SpO2 Readings from Last 3 Encounters:  03/31/12 98%  03/10/12 100%  03/09/12 100%   Pulse Readings from Last 3 Encounters:  03/31/12 93  03/10/12 104  03/09/12 91    Pt is A&O x 3 left lower extremity: Incision/s is/are separated but clean,dry., and  Healing There was some old clot at base of wound, brown in color No tract to deep incision Wound base is clean without hematoma, erythema or drainage Limb is warm; with good color to ankle Toes are cool and cyanotic but unchanged Decreased sensation in left as compared to right  Right Dorsalis Pedis pulse is biphasic by Doppler Right Posterior tibial pulse is  monophasic by Doppler  Left Dorsalis Pedis pulse is absent Left Posterior tibial pulse is  Absent Left peroneal is weak per doppler   Assessment/Plan: Pt. With left open groin wound  Does not appear to tract down to graft Numbness in left leg and decreased pulses per doppler Will admit to hospital to assess patency of CFA Will order cta abd /pelvis with runoff to left Continue wound care as ordered  [name] [phone] 03/31/2012 4:00 PM    I agree with the above. I am recommending admission to the hospital for a CT angiogram of the left lower extremity. I want to rule out nerve compression from possible hematoma. I also want to ensure that her surgical repair remains patent.

## 2012-03-31 NOTE — H&P (Signed)
[facility] [place]  H&P  Date of Surgery: Jan 2014 Left common femoral, superficial femoral, profundofemoral endarterectomy with patch angioplasty (bovine)  Feb 1 ,2014 Debridement of skin and subcutaneous fat necrosis  Surgeon: Vivi Martens, MD  History of Present Illness  Stephanie Peck is a 52 y.o. female who is S/P  Left fem endarterectomy and then debridement left groin wound. she had been home and having wound vac changes Q M,W,F. Nurse today noted increased brownish drainage from the wound like old blood. No active bright red bleading. Pt notes the left leg is now numb and has been that way since this am. She denies pain in the leg and has good motion. She states her toes become ruberous when in the dependent position.   Past Medical History  Diagnosis Date  . Diabetes mellitus   . GERD (gastroesophageal reflux disease)   . Anxiety   . Depression   . CHF (congestive heart failure)   . COPD (chronic obstructive pulmonary disease)   . Sleep apnea     severe OSA by 09/08/06 sleep study [name])   Past Surgical History  Procedure Laterality Date  . Cholecystectomy    . Gastric bypass  2006  . Tonsillectomy    . Femoral artery stent  12/05/2011    right superficial   . Endarterectomy femoral  02/22/2012    Procedure: ENDARTERECTOMY FEMORAL;  Surgeon: Nada Libman, MD;  Location: [facility] OR;  Service: Vascular;  Laterality: Left;  . Patch angioplasty  02/22/2012    Procedure: PATCH ANGIOPLASTY;  Surgeon: Nada Libman, MD;  Location: [facility] OR;  Service: Vascular;  Laterality: Left;  left femoral patch angioplasty  . Application of wound vac  02/22/2012    Procedure: APPLICATION OF WOUND VAC;  Surgeon: Nada Libman, MD;  Location: [facility] OR;  Service: Vascular;  Laterality: Left;  application wound vac  . Groin debridement  03/01/2012    Procedure: GROIN DEBRIDEMENT;  Surgeon: Larina Earthly, MD;  Location: [facility] OR;  Service: Vascular;  Laterality: Left;   Current  Outpatient Prescriptions  Medication Sig Dispense Refill  . albuterol (PROVENTIL HFA;VENTOLIN HFA) 108 (90 BASE) MCG/ACT inhaler Inhale 2 puffs into the lungs every 6 (six) hours as needed. For wheezing and shortness of breath.      Marland Kitchen albuterol (PROVENTIL) (5 MG/ML) 0.5% nebulizer solution Take 2.5 mg by nebulization every 6 (six) hours as needed. For wheezing and shortness of breath.      Marland Kitchen buPROPion (WELLBUTRIN XL) 300 MG 24 hr tablet Take 300 mg by mouth daily.      . calcium carbonate (OS-CAL) 600 MG TABS Take 600 mg by mouth 2 (two) times daily with a meal.      . clopidogrel (PLAVIX) 75 MG tablet Take 1 tablet by mouth daily.      . DULoxetine (CYMBALTA) 60 MG capsule Take 60 mg by mouth daily.      . ferrous fumarate (HEMOCYTE - 106 MG FE) 325 (106 FE) MG TABS Take 1 tablet by mouth.      . gabapentin (NEURONTIN) 600 MG tablet Take 600 mg by mouth 2 (two) times daily.       Marland Kitchen glipiZIDE (GLUCOTROL) 5 MG tablet Take 5 mg by mouth 2 (two) times daily before a meal.      . indomethacin (INDOCIN) 50 MG capsule Take 50 mg by mouth 2 (two) times daily with a meal. Will begin today      .  lansoprazole (PREVACID) 30 MG capsule Take 30 mg by mouth 2 (two) times daily.      Marland Kitchen levofloxacin (LEVAQUIN) 500 MG tablet Take 500 mg by mouth daily. Will start today      . Multiple Vitamin (MULTIVITAMIN) tablet Take 1 tablet by mouth daily.      Marland Kitchen oxycodone (OXY-IR) 5 MG capsule Take 5 mg by mouth every 6 (six) hours as needed. For pain      . promethazine (PHENERGAN) 25 MG tablet Take 25 mg by mouth every 6 (six) hours as needed. For nausea      . simvastatin (ZOCOR) 20 MG tablet Take 20 mg by mouth every evening.      . sitaGLIPtan-metformin (JANUMET) 50-1000 MG per tablet Take 1 tablet by mouth 2 (two) times daily with a meal.      . telmisartan-hydrochlorothiazide (MICARDIS HCT) 80-25 MG per tablet Take 1 tablet by mouth daily.      Marland Kitchen topiramate (TOPAMAX) 50 MG tablet Take 50 mg by mouth 2 (two) times  daily.       No current facility-administered medications for this visit.     Physical Examination  BP Readings from Last 3 Encounters:  03/31/12 96/62  03/10/12 104/62  03/09/12 116/68   Temp Readings from Last 3 Encounters:  03/31/12 98.2 F (36.8 C) Oral  03/10/12 97.5 F (36.4 C) Oral  03/09/12 97.1 F (36.2 C) Oral   SpO2 Readings from Last 3 Encounters:  03/31/12 98%  03/10/12 100%  03/09/12 100%   Pulse Readings from Last 3 Encounters:  03/31/12 93  03/10/12 104  03/09/12 91    Pt is A&O x 3 left lower extremity: Incision/s is/are separated but clean,dry., and  Healing There was some old clot at base of wound, brown in color No tract to deep incision Wound base is clean without hematoma, erythema or drainage Limb is warm; with good color to ankle Toes are cool and cyanotic but unchanged Decreased sensation in left as compared to right  Right Dorsalis Pedis pulse is biphasic by Doppler Right Posterior tibial pulse is  monophasic by Doppler  Left Dorsalis Pedis pulse is absent Left Posterior tibial pulse is  Absent Left peroneal is weak per doppler   Assessment/Plan: Pt. With left open groin wound  Does not appear to tract down to graft Numbness in left leg and decreased pulses per doppler Will admit to hospital to assess patency of CFA Will order cta abd /pelvis with runoff to left Continue wound care as ordered  [facility] J [phone] 03/31/2012 4:00 PM

## 2012-03-31 NOTE — Telephone Encounter (Signed)
Rec'd call from both pt. and home health nurse to report the following; nurse reported that in removing the wound vac today, noted large amt. frank bleeding from left groin wound.  Home care nurse stated that there was also redness in the tissue of the peri-wound.   Stated she was not reapplying the wound vac today, but would dress the left groin with wet-dry NS gauze, and cover with pressure dressing.  Requesting appt. for evaluation of wound.  The pt. called approx. 1 hr. after the home care nurse, stating that "since the nurse had applied the bandage, my left leg and foot feels numb, like it is asleep."  Stated she tried to move it around, but the numbness is still there.   Advised pt. To come in to office now for evaluation.  Agrees w/ plan.

## 2012-04-01 ENCOUNTER — Encounter (HOSPITAL_COMMUNITY): Payer: Self-pay | Admitting: Radiology

## 2012-04-01 DIAGNOSIS — M79609 Pain in unspecified limb: Secondary | ICD-10-CM | POA: Diagnosis not present

## 2012-04-01 LAB — GLUCOSE, CAPILLARY: Glucose-Capillary: 161 mg/dL — ABNORMAL HIGH (ref 70–99)

## 2012-04-01 LAB — HEMOGLOBIN A1C
Hgb A1c MFr Bld: 7.8 % — ABNORMAL HIGH (ref <5.7)
Mean Plasma Glucose: 177 mg/dL — ABNORMAL HIGH (ref <117)

## 2012-04-01 MED ORDER — BUPROPION HCL ER (XL) 300 MG PO TB24
300.0000 mg | ORAL_TABLET | Freq: Every day | ORAL | Status: DC
  Administered 2012-04-01 – 2012-04-09 (×8): 300 mg via ORAL
  Filled 2012-04-01 (×9): qty 1

## 2012-04-01 MED ORDER — TOPIRAMATE 25 MG PO TABS
50.0000 mg | ORAL_TABLET | Freq: Two times a day (BID) | ORAL | Status: DC
  Administered 2012-04-01 – 2012-04-09 (×16): 50 mg via ORAL
  Filled 2012-04-01 (×19): qty 2

## 2012-04-01 MED ORDER — PROMETHAZINE HCL 25 MG PO TABS
25.0000 mg | ORAL_TABLET | Freq: Four times a day (QID) | ORAL | Status: DC | PRN
  Administered 2012-04-04: 25 mg via ORAL
  Filled 2012-04-01: qty 1

## 2012-04-01 MED ORDER — HEPARIN (PORCINE) IN NACL 100-0.45 UNIT/ML-% IJ SOLN
1450.0000 [IU]/h | INTRAMUSCULAR | Status: DC
  Administered 2012-04-01: 1450 [IU]/h via INTRAVENOUS
  Filled 2012-04-01 (×2): qty 250

## 2012-04-01 MED ORDER — DULOXETINE HCL 60 MG PO CPEP
60.0000 mg | ORAL_CAPSULE | Freq: Every day | ORAL | Status: DC
  Administered 2012-04-01 – 2012-04-09 (×8): 60 mg via ORAL
  Filled 2012-04-01 (×9): qty 1

## 2012-04-01 MED ORDER — OXYCODONE HCL 5 MG PO TABS
5.0000 mg | ORAL_TABLET | ORAL | Status: DC | PRN
  Administered 2012-04-01 – 2012-04-09 (×14): 10 mg via ORAL
  Filled 2012-04-01 (×13): qty 2

## 2012-04-01 MED ORDER — HEPARIN BOLUS VIA INFUSION
2500.0000 [IU] | Freq: Once | INTRAVENOUS | Status: AC
  Administered 2012-04-01: 2500 [IU] via INTRAVENOUS
  Filled 2012-04-01: qty 2500

## 2012-04-01 MED ORDER — VANCOMYCIN HCL 10 G IV SOLR
2500.0000 mg | Freq: Once | INTRAVENOUS | Status: AC
  Administered 2012-04-01: 2500 mg via INTRAVENOUS
  Filled 2012-04-01: qty 2500

## 2012-04-01 MED ORDER — ALBUTEROL SULFATE (5 MG/ML) 0.5% IN NEBU: 2.5000 mg | INHALATION_SOLUTION | Freq: Four times a day (QID) | RESPIRATORY_TRACT | Status: DC | PRN

## 2012-04-01 MED ORDER — ALBUTEROL SULFATE HFA 108 (90 BASE) MCG/ACT IN AERS: 2.0000 | INHALATION_SPRAY | Freq: Four times a day (QID) | RESPIRATORY_TRACT | Status: DC | PRN

## 2012-04-01 MED ORDER — IRBESARTAN 300 MG PO TABS
300.0000 mg | ORAL_TABLET | Freq: Every day | ORAL | Status: DC
  Administered 2012-04-01: 300 mg via ORAL
  Filled 2012-04-01 (×2): qty 1

## 2012-04-01 MED ORDER — CALCIUM CARBONATE 1250 (500 CA) MG PO TABS
1.0000 | ORAL_TABLET | Freq: Two times a day (BID) | ORAL | Status: DC
  Administered 2012-04-01 – 2012-04-09 (×15): 500 mg via ORAL
  Filled 2012-04-01 (×21): qty 1

## 2012-04-01 MED ORDER — HYDROCHLOROTHIAZIDE 25 MG PO TABS
25.0000 mg | ORAL_TABLET | Freq: Every day | ORAL | Status: DC
  Administered 2012-04-01 – 2012-04-02 (×2): 25 mg via ORAL
  Filled 2012-04-01 (×3): qty 1

## 2012-04-01 MED ORDER — VANCOMYCIN HCL 10 G IV SOLR
1750.0000 mg | INTRAVENOUS | Status: DC
  Administered 2012-04-02 – 2012-04-08 (×7): 1750 mg via INTRAVENOUS
  Filled 2012-04-01 (×9): qty 1750

## 2012-04-01 MED ORDER — HEPARIN BOLUS VIA INFUSION
4000.0000 [IU] | Freq: Once | INTRAVENOUS | Status: AC
  Administered 2012-04-01: 4000 [IU] via INTRAVENOUS
  Filled 2012-04-01: qty 4000

## 2012-04-01 MED ORDER — FERROUS FUMARATE 325 (106 FE) MG PO TABS
1.0000 | ORAL_TABLET | Freq: Every day | ORAL | Status: DC
  Administered 2012-04-01 – 2012-04-09 (×8): 106 mg via ORAL
  Filled 2012-04-01 (×9): qty 1

## 2012-04-01 MED ORDER — OXYCODONE HCL 5 MG PO TABS
5.0000 mg | ORAL_TABLET | ORAL | Status: DC | PRN
  Filled 2012-04-01: qty 2

## 2012-04-01 MED ORDER — HEPARIN (PORCINE) IN NACL 100-0.45 UNIT/ML-% IJ SOLN
2800.0000 [IU]/h | INTRAMUSCULAR | Status: DC
  Administered 2012-04-01: 1700 [IU]/h via INTRAVENOUS
  Administered 2012-04-02: 2300 [IU]/h via INTRAVENOUS
  Administered 2012-04-02: 1700 [IU]/h via INTRAVENOUS
  Administered 2012-04-02: 2000 [IU]/h via INTRAVENOUS
  Administered 2012-04-02: 2300 [IU]/h via INTRAVENOUS
  Administered 2012-04-03: 2500 [IU]/h via INTRAVENOUS
  Administered 2012-04-03 (×2): 2800 [IU]/h via INTRAVENOUS
  Filled 2012-04-01 (×12): qty 250

## 2012-04-01 MED ORDER — GABAPENTIN 600 MG PO TABS
600.0000 mg | ORAL_TABLET | Freq: Two times a day (BID) | ORAL | Status: DC
  Administered 2012-04-01 – 2012-04-09 (×16): 600 mg via ORAL
  Filled 2012-04-01 (×19): qty 1

## 2012-04-01 MED ORDER — PIPERACILLIN-TAZOBACTAM 3.375 G IVPB
3.3750 g | Freq: Three times a day (TID) | INTRAVENOUS | Status: DC
  Administered 2012-04-01 – 2012-04-09 (×23): 3.375 g via INTRAVENOUS
  Filled 2012-04-01 (×27): qty 50

## 2012-04-01 MED ORDER — MORPHINE SULFATE 4 MG/ML IJ SOLN
3.0000 mg | INTRAMUSCULAR | Status: DC | PRN
  Administered 2012-04-02 (×2): 4 mg via INTRAVENOUS
  Administered 2012-04-04: 3 mg via INTRAVENOUS
  Administered 2012-04-05 (×2): 4 mg via INTRAVENOUS
  Administered 2012-04-06: 3 mg via INTRAVENOUS
  Administered 2012-04-08: 4 mg via INTRAVENOUS
  Filled 2012-04-01 (×8): qty 1

## 2012-04-01 MED ORDER — SODIUM CHLORIDE 0.9 % IV SOLN
INTRAVENOUS | Status: DC
  Administered 2012-04-01 – 2012-04-04 (×6): via INTRAVENOUS

## 2012-04-01 MED ORDER — TELMISARTAN-HCTZ 80-25 MG PO TABS: 1.0000 | ORAL_TABLET | Freq: Every day | ORAL | Status: DC

## 2012-04-01 MED ORDER — CALCIUM CARBONATE 600 MG PO TABS: 600.0000 mg | ORAL_TABLET | Freq: Two times a day (BID) | ORAL | Status: DC

## 2012-04-01 MED ORDER — SIMVASTATIN 20 MG PO TABS
20.0000 mg | ORAL_TABLET | Freq: Every day | ORAL | Status: DC
  Administered 2012-04-01 – 2012-04-08 (×7): 20 mg via ORAL
  Filled 2012-04-01 (×10): qty 1

## 2012-04-01 NOTE — Progress Notes (Signed)
UR Completed Rejeana Fadness Graves-Bigelow, RN,BSN [phone]  

## 2012-04-01 NOTE — Progress Notes (Addendum)
[facility] [place]  Progress Note Bypass Surgery   History of Present Illness  Stephanie Peck is a 52 y.o. female who is S/P  Left, CFA end with patch .  The patient's symptoms of numbness in the left foot are Unchanged . Patients pain is well controlled.     Imaging: Ct Angio Ao+bifem W/cm &/or Wo/cm  04/01/2012  *RADIOLOGY REPORT*  Clinical Data:  Left leg ischemia  CT ANGIOGRAPHY OF ABDOMINAL AORTA WITH ILIOFEMORAL RUNOFF  Technique:  Multidetector CT imaging of the abdomen, pelvis and lower extremities was performed using the standard protocol during bolus administration of intravenous contrast.  Multiplanar CT image reconstructions including MIPs were obtained to evaluate the vascular anatomy.  Contrast: OMNIPAQUE IOHEXOL 350 MG/ML SOLN  Comparison:  Report from aortogram dated 12/05/2011 describes patency but significant narrowing of the left external iliac artery.  There is also report of occlusion of the left superficial femoral artery and significant narrowing in the right superficial femoral artery.  Images are not available at this time.  Findings:  Opacification of the vascular system is markedly limited.  The study is diagnostic within the abdomen.  It is somewhat diagnostic in the pelvis.  It is nondiagnostic below the inguinal ligaments.  The aorta is widely patent and nonaneurysmal.  Celiac and SMA are widely patent.  Branch vessels of the celiac and SMA are also widely patent.  A single left renal artery is widely patent.  There are two right renal arteries.  The more diminutive inferior right renal artery is patent.  There is significant narrowing of the more dominant and superior right renal artery just beyond its takeoff.  There is faint opacification of the iliac arteries.  The right common, internal, and external iliac arteries are grossly patent. The left common iliac artery is patent.  Left internal iliac artery is ectatic and patent with a diameter of  8 mm.  Left common iliac artery is occluded just beyond its takeoff.  Below the inguinal ligaments, the CT angiogram is virtually nondiagnostic.  The right common femoral artery faintly opacifies and is grossly patent.  Profunda femoral artery is grossly patent. There is felt to be diffuse disease of the superficial femoral artery and a significant narrowing cannot be excluded.  The stented portion of the superficial femoral artery is difficult to evaluate. Opacification of the popliteal and tibial arteries is very poor.  The left common femoral artery faintly reconstitutes.  There is felt to be severe narrowing at the origin of the left superficial femoral artery.  The superficial femoral artery faintly opacifies throughout the remainder of its course.  The popliteal and tibial arteries faintly opacified. The left inguinal surgical site is noted.  A surgical wound extends into the subcutaneous fat of the left inguinal region.  No evidence of abscess formation. Inflammatory lymph nodes are noted.  Of note, the greater saphenous veins are dilated bilaterally. Varicosities are seen in the superficial fat of the calves bilaterally.  These findings suggest venous insufficiency in the greater saphenous veins.  Post cholecystectomy.  Postoperative changes in the stomach are noted.  Chronic changes of the kidneys are present with lobulation. The spleen, pancreas are within normal limits.  There is prominence of the adrenal gland without focal nodule.  A small lipoma is present within the left adrenal gland which has a benign appearance.  Negative free fluid.  Bladder is mildly distended.  Uterus and adnexa are within normal limits.  Degenerative disc  disease at L5-S1 results and foraminal osteophytes causing L5 nerve root encroachment.  Degenerative change of the SI joints left greater than right.   Review of the MIP images confirms the above findings.  IMPRESSION: The arterial portion of the study is limited.  The  abdominal exam is diagnostic.  The pelvic CT a portion is somewhat diagnostic. Below the inguinal ligaments, the study is nondiagnostic.  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  Significant narrowing in the dominant right renal artery.  Severe narrowing at the origin of the left superficial femoral artery.  Left inguinal surgical changes and healing wound as described.  No abscess.  Venous insufficiency within both greater saphenous veins is suspected.  Chronic and postoperative changes.   Original Report Authenticated By: Jolaine Click, M.D.     Significant Diagnostic Studies: CBC Lab Results  Component Value Date   WBC 11.7* 03/31/2012   HGB 11.9* 03/31/2012   HCT 34.9* 03/31/2012   MCV 94.3 03/31/2012   PLT 379 03/31/2012    BMET     Component Value Date/Time   NA 138 03/31/2012 2056   K 4.6 03/31/2012 2056   CL 104 03/31/2012 2056   CO2 23 03/31/2012 2056   GLUCOSE 115* 03/31/2012 2056   BUN 21 03/31/2012 2056   CREATININE 1.55* 03/31/2012 2056   CALCIUM 9.1 03/31/2012 2056   GFRNONAA 37* 03/31/2012 2056   GFRAA 43* 03/31/2012 2056    COAG Lab Results  Component Value Date   INR 1.03 03/31/2012   INR 1.08 03/01/2012   INR 1.04 02/29/2012   No results found for this basename: PTT    Physical Examination  BP Readings from Last 3 Encounters:  04/01/12 99/46  03/31/12 96/62  03/10/12 104/62   Temp Readings from Last 3 Encounters:  04/01/12 98.1 F (36.7 C) Oral  03/31/12 98.2 F (36.8 C) Oral  03/10/12 97.5 F (36.4 C) Oral   SpO2 Readings from Last 3 Encounters:  04/01/12 92%  03/31/12 98%  03/10/12 100%   Pulse Readings from Last 3 Encounters:  04/01/12 84  03/31/12 93  03/10/12 104    Pt is A&O x 3 left lower extremity: Incision/s is/are separated, and  healing without hematoma, erythema or drainage Limb is warm; with good color, toes less cyanotic Still with decreased sensation in the left leg  Left Dorsalis Pedis pulse is absent Left Posterior tibial pulse is   monophasic by Doppler   Assessment/Plan: Pt. Continues to have numbness left leg, color, doppler signal better  pain is controlled Wounds are healing well - open left groin wound, continue dressing changes CTA as above. 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 There is no evidence of renal artery stenosis. The infrarenal abdominal aorta is widely patent. Bilateral common iliac arteries are widely patent. The right external iliac artery and hypogastric arteries are widely patent. The left external iliac artery is patent however the sheath is nearly occlusive.  Right Lower Extremity: The right common femoral artery is widely patent. The right fund femoral artery is widely patent. The right superficial femoral artery is patent however there is a approximately 80% stenosis within the adductor canal for a distance of about 20 mm. There is three-vessel runoff.  Left Lower Extremity: The left common femoral artery is occluded. The left superficial femoral and profunda femoral artery are reconstituted and widely patent. The popliteal artery  is patent throughout it's course. There is three-vessel runoff.     [name] [phone] 04/01/2012 8:41 AM  I agree with the above.  The patient has been seen and examined.  I have reviewed her CTA which shows occlusion of her previous repair..  She continues to have an open left groin wound.  This is a very complicated situation.  The patient has signs of ischemic changes to her left foot (numbness).  Fortunately, she has full motor function.  She is at high risk for wound complications given her current exam and her weight.  I feel that I must attempt revascularization, despite her groin wound as she has numbness to her left foot likely from poor circulation.  It is unclear at this time why her previous repair failed, but  infection is a factor.  I am planning on attempting revascularization which will be re-exploration of the left groin with thrombectomy.  I will close the arteriotomy with either vein or CorMAtrix.  The existing patch will be removed.  I have asked Dr. Kelly Splinter to evaluate her for a muscle flap to cover the artery repair.  Our schedules dictate that this will be done on Friday.  She will be kept in the hospital on Heparin and antibiotics until that time with wet to dry dressing changes.  Stephanie Peck

## 2012-04-01 NOTE — H&P (Signed)
Progress Note Bypass Surgery  Date of Surgery: Jan 2014  Left common femoral, superficial femoral, profundofemoral endarterectomy with patch angioplasty (bovine)  Feb 1 ,2014 Debridement of skin and subcutaneous fat necrosis  Surgeon: Stephanie Martens, MD  History of Present Illness  Stephanie Peck is a 52 y.o. female who is S/P Left fem endarterectomy and then debridement left groin wound. she had been home and having wound vac changes Q M,W,F. Nurse today noted increased brownish drainage from the wound like old blood. No active bright red bleading. Pt notes the left leg is now numb and has been that way since this am. She denies pain in the leg and has good motion. She states her toes become ruberous when in the dependent position.  Past Medical History   Diagnosis  Date   .  Diabetes mellitus    .  GERD (gastroesophageal reflux disease)    .  Anxiety    .  Depression    .  CHF (congestive heart failure)    .  COPD (chronic obstructive pulmonary disease)    .  Sleep apnea      severe OSA by 09/08/06 sleep study Stephanie Peck)    Past Surgical History   Procedure  Laterality  Date   .  Cholecystectomy     .  Gastric bypass   2006   .  Tonsillectomy     .  Femoral artery stent   12/05/2011     right superficial   .  Endarterectomy femoral   02/22/2012     Procedure: ENDARTERECTOMY FEMORAL; Surgeon: Stephanie Libman, MD; Location: [facility] OR; Service: Vascular; Laterality: Left;   .  Patch angioplasty   02/22/2012     Procedure: PATCH ANGIOPLASTY; Surgeon: Stephanie Libman, MD; Location: [facility] OR; Service: Vascular; Laterality: Left; left femoral patch angioplasty   .  Application of wound vac   02/22/2012     Procedure: APPLICATION OF WOUND VAC; Surgeon: Stephanie Libman, MD; Location: [facility] OR; Service: Vascular; Laterality: Left; application wound vac   .  Groin debridement   03/01/2012     Procedure: GROIN DEBRIDEMENT; Surgeon: Stephanie Earthly, MD; Location: [facility] OR; Service: Vascular; Laterality: Left;    Current  Outpatient Prescriptions   Medication  Sig  Dispense  Refill   .  albuterol (PROVENTIL HFA;VENTOLIN HFA) 108 (90 BASE) MCG/ACT inhaler  Inhale 2 puffs into the lungs every 6 (six) hours as needed. For wheezing and shortness of breath.     Marland Kitchen  albuterol (PROVENTIL) (5 MG/ML) 0.5% nebulizer solution  Take 2.5 mg by nebulization every 6 (six) hours as needed. For wheezing and shortness of breath.     Marland Kitchen  buPROPion (WELLBUTRIN XL) 300 MG 24 hr tablet  Take 300 mg by mouth daily.     .  calcium carbonate (OS-CAL) 600 MG TABS  Take 600 mg by mouth 2 (two) times daily with a meal.     .  clopidogrel (PLAVIX) 75 MG tablet  Take 1 tablet by mouth daily.     .  DULoxetine (CYMBALTA) 60 MG capsule  Take 60 mg by mouth daily.     .  ferrous fumarate (HEMOCYTE - 106 MG FE) 325 (106 FE) MG TABS  Take 1 tablet by mouth.     .  gabapentin (NEURONTIN) 600 MG tablet  Take 600 mg by mouth 2 (two) times daily.     Marland Kitchen  glipiZIDE (GLUCOTROL) 5 MG tablet  Take 5 mg by  mouth 2 (two) times daily before a meal.     .  indomethacin (INDOCIN) 50 MG capsule  Take 50 mg by mouth 2 (two) times daily with a meal. Will begin today     .  lansoprazole (PREVACID) 30 MG capsule  Take 30 mg by mouth 2 (two) times daily.     Marland Kitchen  levofloxacin (LEVAQUIN) 500 MG tablet  Take 500 mg by mouth daily. Will start today     .  Multiple Vitamin (MULTIVITAMIN) tablet  Take 1 tablet by mouth daily.     Marland Kitchen  oxycodone (OXY-IR) 5 MG capsule  Take 5 mg by mouth every 6 (six) hours as needed. For pain     .  promethazine (PHENERGAN) 25 MG tablet  Take 25 mg by mouth every 6 (six) hours as needed. For nausea     .  simvastatin (ZOCOR) 20 MG tablet  Take 20 mg by mouth every evening.     .  sitaGLIPtan-metformin (JANUMET) 50-1000 MG per tablet  Take 1 tablet by mouth 2 (two) times daily with a meal.     .  telmisartan-hydrochlorothiazide (MICARDIS HCT) 80-25 MG per tablet  Take 1 tablet by mouth daily.     Marland Kitchen  topiramate (TOPAMAX) 50 MG tablet  Take 50 mg by  mouth 2 (two) times daily.      No current facility-administered medications for this visit.   Physical Examination  BP Readings from Last 3 Encounters:   03/31/12  96/62   03/10/12  104/62   03/09/12  116/68    Temp Readings from Last 3 Encounters:   03/31/12  98.2 F (36.8 C) Oral   03/10/12  97.5 F (36.4 C) Oral   03/09/12  97.1 F (36.2 C) Oral    SpO2 Readings from Last 3 Encounters:   03/31/12  98%   03/10/12  100%   03/09/12  100%    Pulse Readings from Last 3 Encounters:   03/31/12  93   03/10/12  104   03/09/12  91   Pt is A&O x 3  left lower extremity: Incision/s is/are separated but clean,dry., and Healing  There was some old clot at base of wound, brown in color  No tract to deep incision  Wound base is clean  without hematoma, erythema or drainage  Limb is warm; with good color to ankle  Toes are cool and cyanotic but unchanged  Decreased sensation in left as compared to right  Right Dorsalis Pedis pulse is biphasic by Doppler  Right Posterior tibial pulse is monophasic by Doppler  Left Dorsalis Pedis pulse is absent  Left Posterior tibial pulse is Absent  Left peroneal is weak per doppler  Assessment/Plan:  Pt. With left open groin wound  Does not appear to tract down to graft  Numbness in left leg and decreased pulses per doppler  Will admit to hospital to assess patency of CFA  Will order cta abd /pelvis with runoff to left  Continue wound care as ordered  Stephanie Peck  [phone]  03/31/2012  4:00 PM  I agree with the above. I am recommending admission to the hospital for a CT angiogram of the left lower extremity. I want to rule out nerve compression from possible hematoma. I also want to ensure that her surgical repair remains patent.

## 2012-04-01 NOTE — Progress Notes (Signed)
[facility]  Patient Status: Active (receiving services up to time of hospitalization)  [facility] is providing the following services: RN  If patient discharges after hours, please call [phone].   Stephanie Peck 04/01/2012, 2:52 PM

## 2012-04-01 NOTE — Progress Notes (Signed)
ANTICOAGULATION CONSULT NOTE - Initial Consult  Pharmacy Consult for Heparin Indication: Lower Extremity Ischemia  Allergies  Allergen Reactions  . Codeine Nausea Only    Patient Measurements: Height: 5\' 7"  (170.2 cm) Weight: 278 lb 10.6 oz (126.4 kg) IBW/kg (Calculated) : 61.6 Heparin Dosing Weight: 92 kg  Vital Signs: Temp: 97.9 F (36.6 C) (03/04 0800) Temp src: Oral (03/04 0800) BP: 110/64 mmHg (03/04 0800) Pulse Rate: 84 (03/04 0800)  Labs:  Recent Labs  03/31/12 2056  HGB 11.9*  HCT 34.9*  PLT 379  LABPROT 13.4  INR 1.03  CREATININE 1.55*    Estimated Creatinine Clearance: 58 ml/min (by C-G formula based on Cr of 1.55).   Medical History: Past Medical History  Diagnosis Date  . Diabetes mellitus   . GERD (gastroesophageal reflux disease)   . Anxiety   . CHF (congestive heart failure)   . COPD (chronic obstructive pulmonary disease)   . Sleep apnea     severe OSA by 09/08/06 sleep study ([facility])  . Depression     Medications:  Infusions:  . dextrose 5 % and 0.45% NaCl 75 mL/hr at 03/31/12 2207    Assessment: 51 year old female who is s/p left femoral endarterectomy 02/22/12 and left groin wound debridement 03/01/12 who is admitted with left foot numbness.  She is to begin anticoagulation with Heparin for lower extremity ischemia.  Goal of Therapy:  Heparin level 0.3-0.7 units/ml Monitor platelets by anticoagulation protocol: Yes   Plan:  Heparin 4000 units IV bolus x 1 Start Heparin infusion at 1450 units/hr Check Heparin level in 6 hours Daily Heparin level and CBC  Estella Husk, Pharm.D., BCPS Clinical Pharmacist  Phone [phone] Pager [phone] 04/01/2012, 11:10 AM

## 2012-04-01 NOTE — Progress Notes (Signed)
ANTIBIOTIC CONSULT NOTE - INITIAL  Pharmacy Consult for Vancomycin and Zosyn Indication: infected groin  Allergies  Allergen Reactions  . Codeine Nausea Only    Patient Measurements: Height: 5\' 7"  (170.2 cm) Weight: 278 lb 10.6 oz (126.4 kg) IBW/kg (Calculated) : 61.6  Vital Signs: Temp: 98.3 F (36.8 C) (03/04 2024) Temp src: Oral (03/04 2024) BP: 118/61 mmHg (03/04 2024) Pulse Rate: 86 (03/04 2024) Intake/Output from previous day: 03/03 0701 - 03/04 0700 In: 675 [I.V.:675] Out: 1100 [Urine:1100] Intake/Output from this shift: Total I/O In: 360 [P.O.:360] Out: -   Labs:  Recent Labs  03/31/12 2056  WBC 11.7*  HGB 11.9*  PLT 379  CREATININE 1.55*   Estimated Creatinine Clearance: 58 ml/min (by C-G formula based on Cr of 1.55).  Microbiology: Recent Results (from the past 720 hour(s))  MRSA PCR SCREENING     Status: None   Collection Time    03/31/12  5:44 PM      Result Value Range Status   MRSA by PCR NEGATIVE  NEGATIVE Final   Comment:            The GeneXpert MRSA Assay (FDA     approved for NASAL specimens     only), is one component of a     comprehensive MRSA colonization     surveillance program. It is not     intended to diagnose MRSA     infection nor to guide or     monitor treatment for     MRSA infections.    Medical History: Past Medical History  Diagnosis Date  . Diabetes mellitus   . GERD (gastroesophageal reflux disease)   . Anxiety   . CHF (congestive heart failure)   . COPD (chronic obstructive pulmonary disease)   . Sleep apnea     severe OSA by 09/08/06 sleep study ([facility])  . Depression    Assessment: 51yof known to pharmacy for heparin dosing to now begin broad spectrum antibiotics for an infected left groin. Renal insufficiency noted. Will use obesity nomogram given weight of 126kg.  Goal of Therapy:  Vancomycin trough level 10-15  Appropriate zosyn dosing  Plan:  1) Vancomycin 2500mg  IV x 1 then 1750mg  IV q24 2)  Zosyn 3.375g IV q8 (4 hour infusion) 3) Follow renal function, cultures, trough at steady state  Fredrik Rigger 04/01/2012,8:36 PM

## 2012-04-01 NOTE — Care Management Note (Signed)
    Page 1 of 2   04/10/2012     4:38:27 PM   CARE MANAGEMENT NOTE 04/10/2012  Patient:  Stephanie Peck, Stephanie Peck   Account Number:  [id number]  Date Initiated:  04/01/2012  Documentation initiated by:  GRAVES-BIGELOW,BRENDA  Subjective/Objective Assessment:   Pt admitted with numbness of leg. Pt was active with [facility] for RN services for wound VAC care.     Action/Plan:   CM will continue to monitor for dispositon needs. Pt will need resumption orders for [facility] services is this is the plan at d/c when stable.   Anticipated DC Date:  04/04/2012   Anticipated DC Plan:  HOME W HOME HEALTH SERVICES      DC Planning Services  CM consult      [facility] Choice  Resumption Of Svcs/PTA Provider   Choice offered to / List presented to:  C-1 Patient        HH arranged  HH-1 RN  HH-2 PT  HH-3 OT      [facility] agency  [facility]   Status of service:  Completed, signed off Medicare Important Message given?   (If response is "NO", the following Medicare IM given date fields will be blank) Date Medicare IM given:   Date Additional Medicare IM given:    Discharge Disposition:  HOME W HOME HEALTH SERVICES  Per UR Regulation:  Reviewed for med. necessity/level of care/duration of stay  If discussed at Long Length of Stay Meetings, dates discussed:    Comments:  04/09/12 JULIE AMERSON,RN,BSN [phone] PT FOR DC HOME TODAY WITH SPOUSE AND HH SERVICES.  NOTIFIED [facility] OF DC TODAY AND RESUMPTION OF HH SERVICES.  PT CONSIDERED POSSIBLE SNF PLACEMENT, BUT FEELS THAT SHE WILL BE ABLE TO GO HOME.  SHE STATES FAMILY AND FRIENDS WILL FOLLOW UP WITH HER AT HOME TO ASSIST.  04/03/12 JULIE AMERSON,RN,BSN [phone] PT STATES SHE HAS RW, BSC, AND [facility] WOUND VAC AT HOME.  WILL FOLLOW FOR HOME NEEDS.  SCHEDULED FOR OR TOMORROW 04/04/12.

## 2012-04-01 NOTE — Plan of Care (Signed)
Problem: Phase I Progression Outcomes Goal: Pain controlled with appropriate interventions Outcome: Progressing vss Goal: Voiding-avoid urinary catheter unless indicated Outcome: Progressing voiding Goal: Hemodynamically stable Outcome: Progressing vss

## 2012-04-01 NOTE — Progress Notes (Signed)
Patient being transferred to unit 2000 per MD order. Gave report to [place], RN, patient will travel in wheelchair, alert and oriented, all vital signs within normal limits.

## 2012-04-01 NOTE — Progress Notes (Signed)
ANTICOAGULATION CONSULT NOTE - Follow Up Consult  Pharmacy Consult for Heparin Indication: Lower Extremity Ischemia  Allergies  Allergen Reactions  . Codeine Nausea Only    Patient Measurements: Height: 5\' 7"  (170.2 cm) Weight: 278 lb 10.6 oz (126.4 kg) IBW/kg (Calculated) : 61.6 Heparin Dosing Weight: 92kg  Vital Signs: Temp: 97.6 F (36.4 C) (03/04 1753) Temp src: Oral (03/04 1600) BP: 127/49 mmHg (03/04 1753) Pulse Rate: 91 (03/04 1753)  Labs:  Recent Labs  03/31/12 2056 04/01/12 1827  HGB 11.9*  --   HCT 34.9*  --   PLT 379  --   LABPROT 13.4  --   INR 1.03  --   HEPARINUNFRC  --  0.14*  CREATININE 1.55*  --     Estimated Creatinine Clearance: 58 ml/min (by C-G formula based on Cr of 1.55).   Medications:  Heparin @ 1450 units/hr  Assessment: 52 year old female who is s/p left femoral endarterectomy 02/22/12 and left groin wound debridement 03/01/12 was started on heparin earlier today for left foot numbness due to ischemia. Initial heparin level is subtherapeutic. No issues with infusion. No bleeding reported.  Goal of Therapy:  Heparin level 0.3-0.7 units/ml Monitor platelets by anticoagulation protocol: Yes   Plan:  1) Heparin bolus 2500 units x 1 2) Increase heparin to 1700 units/hr 3) 6 hour heparin level  Fredrik Rigger 04/01/2012,7:05 PM

## 2012-04-02 LAB — BASIC METABOLIC PANEL
Calcium: 8.8 mg/dL (ref 8.4–10.5)
GFR calc non Af Amer: 34 mL/min — ABNORMAL LOW (ref >90)
Glucose, Bld: 126 mg/dL — ABNORMAL HIGH (ref 70–99)
Sodium: 139 mEq/L (ref 135–145)

## 2012-04-02 LAB — CBC
HCT: 34.9 % — ABNORMAL LOW (ref 36.0–46.0)
Hemoglobin: 11.9 g/dL — ABNORMAL LOW (ref 12.0–15.0)
MCV: 93.1 fL (ref 78.0–100.0)
RBC: 3.75 MIL/uL — ABNORMAL LOW (ref 3.87–5.11)
WBC: 8.7 10*3/uL (ref 4.0–10.5)

## 2012-04-02 LAB — GLUCOSE, CAPILLARY
Glucose-Capillary: 128 mg/dL — ABNORMAL HIGH (ref 70–99)
Glucose-Capillary: 170 mg/dL — ABNORMAL HIGH (ref 70–99)

## 2012-04-02 LAB — HEPARIN LEVEL (UNFRACTIONATED): Heparin Unfractionated: 0.18 IU/mL — ABNORMAL LOW (ref 0.30–0.70)

## 2012-04-02 MED ORDER — HEPARIN BOLUS VIA INFUSION
2500.0000 [IU] | Freq: Once | INTRAVENOUS | Status: AC
  Administered 2012-04-02: 2500 [IU] via INTRAVENOUS
  Filled 2012-04-02: qty 2500

## 2012-04-02 NOTE — Progress Notes (Signed)
ANTICOAGULATION CONSULT NOTE - Follow Up Consult  Pharmacy Consult for Heparin Indication: Lower Extremity Ischemia  Allergies  Allergen Reactions  . Codeine Nausea Only    Patient Measurements: Height: 5\' 7"  (170.2 cm) Weight: 278 lb 10.6 oz (126.4 kg) IBW/kg (Calculated) : 61.6 Heparin Dosing Weight: 92kg  Vital Signs: Temp: 98.3 F (36.8 C) (03/04 2024) Temp src: Oral (03/04 2024) BP: 118/61 mmHg (03/04 2024) Pulse Rate: 86 (03/04 2024)  Labs:  Recent Labs  03/31/12 2056 04/01/12 1827 04/02/12 0100  HGB 11.9*  --  11.9*  HCT 34.9*  --  34.9*  PLT 379  --  359  LABPROT 13.4  --   --   INR 1.03  --   --   HEPARINUNFRC  --  0.14* 0.20*  CREATININE 1.55*  --  1.66*    Estimated Creatinine Clearance: 54.1 ml/min (by C-G formula based on Cr of 1.66).   Medications:  Heparin @ 1700 units/hr  Assessment: 52 year old female who is s/p left femoral endarterectomy 02/22/12 and left groin wound debridement 03/01/12 was started on heparin for left foot numbness due to ischemia. Heparin level (0.20) is below-goal on 1700 units/hr. No problem with line / infusion and no bleeding per RN.   Goal of Therapy:  Heparin level 0.3-0.7 units/ml Monitor platelets by anticoagulation protocol: Yes   Plan:  1. Heparin bolus 2500 units x 1 2. Increase IV heparin to 2000 units/hr 3. Heparin level in 6 hours  Emeline Gins 04/02/2012,2:16 AM

## 2012-04-02 NOTE — Progress Notes (Signed)
[facility] Progress Note  04/02/2012 7:40 AM HD 2  Subjective:  No complaints  Afebrile x 24 hrs VSS 94% RA   Filed Vitals:   04/02/12 0432  BP: 110/63  Pulse: 77  Temp: 98 F (36.7 C)  Resp: 20    Physical Exam: Cardiac:  regular Lungs:  Non labored Extremities:  Left DP + doppler signal; left PT is absent  CBC    Component Value Date/Time   WBC 8.7 04/02/2012 0100   RBC 3.75* 04/02/2012 0100   HGB 11.9* 04/02/2012 0100   HCT 34.9* 04/02/2012 0100   PLT 359 04/02/2012 0100   MCV 93.1 04/02/2012 0100   MCH 31.7 04/02/2012 0100   MCHC 34.1 04/02/2012 0100   RDW 13.8 04/02/2012 0100   LYMPHSABS 3.0 02/05/2007 0319   MONOABS 1.8* 02/05/2007 0319   EOSABS 0.0 02/05/2007 0319   BASOSABS 0.0 02/05/2007 0319    BMET    Component Value Date/Time   NA 139 04/02/2012 0100   K 4.3 04/02/2012 0100   CL 107 04/02/2012 0100   CO2 22 04/02/2012 0100   GLUCOSE 126* 04/02/2012 0100   BUN 21 04/02/2012 0100   CREATININE 1.66* 04/02/2012 0100   CALCIUM 8.8 04/02/2012 0100   GFRNONAA 34* 04/02/2012 0100   GFRAA 40* 04/02/2012 0100    INR    Component Value Date/Time   INR 1.03 03/31/2012 2056     Intake/Output Summary (Last 24 hours) at 04/02/12 0740 Last data filed at 04/02/12 0545  Gross per 24 hour  Intake 4408.7 ml  Output      2 ml  Net 4406.7 ml   CTA 04/01/12:  IMPRESSION:  The arterial portion of the study is limited. The abdominal exam  is diagnostic. The pelvic CT a portion is somewhat diagnostic.  Below the inguinal ligaments, the study is nondiagnostic.  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  Significant narrowing in the dominant right renal artery.  Severe narrowing at the origin of the left superficial femoral  artery.  Left inguinal surgical changes and healing wound as described. No  abscess.  Venous insufficiency within both greater saphenous veins is  suspected.  Chronic and postoperative changes.  Assessment/Plan:  52 y.o. female is:   Date of Surgery:  Jan 2014  Left common femoral, superficial femoral, profundofemoral endarterectomy with patch angioplasty (bovine)  Feb 1 ,2014 Debridement of skin and subcutaneous fat necrosis    HD 2  -continue heparin per pharmacy -continue vanc/zosyn -plan for re-vascularization per Dr. Myra Gianotti -plastics consult for muscle flap-surgery Thursday or Friday -creatinine up slightly 1.66 from 1.55-pt received 100 cc of IV contrast with CTA last pm. Will discontinue Avapro at this time-restart when Creatinine improved.   -Continue maintenance IVF for now -check labs in am   [name], [place] [facility] [phone] 04/02/2012 7:40 AM  Agree with above Plans for surgery Friday   Durene Cal

## 2012-04-02 NOTE — Progress Notes (Signed)
Received call from RN this morning regarding setting up a CPAP for OSA history. Per RN, pt wears CPAP at home and wants to be sure she can have one here as well. Documentation of OSA history in pt's chart, but no active order at this time for CPAP while pt is admitted. RT notified RN this afternoon around 1450 to request order from MD for CPAP. RT will continue to monitor.

## 2012-04-02 NOTE — Progress Notes (Signed)
ANTICOAGULATION CONSULT NOTE - Follow Up Consult  Pharmacy Consult for Heparin Indication: Lower Extremity Ischemia  Allergies  Allergen Reactions  . Codeine Nausea Only    Patient Measurements: Height: 5\' 7"  (170.2 cm) Weight: 278 lb 10.6 oz (126.4 kg) IBW/kg (Calculated) : 61.6 Heparin Dosing Weight: 92kg  Vital Signs: Temp: 97.8 F (36.6 C) (03/05 1345) Temp src: Oral (03/05 1345) BP: 134/56 mmHg (03/05 1345) Pulse Rate: 93 (03/05 1345)  Labs:  Recent Labs  03/31/12 2056  04/02/12 0100 04/02/12 0836 04/02/12 1751  HGB 11.9*  --  11.9*  --   --   HCT 34.9*  --  34.9*  --   --   PLT 379  --  359  --   --   LABPROT 13.4  --   --   --   --   INR 1.03  --   --   --   --   HEPARINUNFRC  --   < > 0.20* 0.18* 0.33  CREATININE 1.55*  --  1.66*  --   --   < > = values in this interval not displayed.  Estimated Creatinine Clearance: 54.1 ml/min (by C-G formula based on Cr of 1.66).   Medications:  Heparin @ 2300 units/hr  Assessment: 52 year old female who is s/p left femoral endarterectomy 02/22/12 and left groin wound debridement 03/01/12 continues on heparin for left foot numbness due to ischemia. Heparin level is now therapeutic after multiple re-boluses and rate increases. No bleeding reported.  Goal of Therapy:  Heparin level 0.3-0.7 units/ml Monitor platelets by anticoagulation protocol: Yes   Plan:  1) Continue heparin @ 2300 units/hr 2) 6 hour heparin level to confirm 3) Follow up plans for re-vascularization  Fredrik Rigger 04/02/2012,6:28 PM

## 2012-04-02 NOTE — Progress Notes (Signed)
ANTICOAGULATION CONSULT NOTE - Follow Up Consult  Pharmacy Consult for heparin Indication: Lower Extremity Ischemia  Heparin Dosing Weight: 92 kg  Labs:  Recent Labs  03/31/12 2056 04/01/12 1827 04/02/12 0100 04/02/12 0836  HGB 11.9*  --  11.9*  --   HCT 34.9*  --  34.9*  --   PLT 379  --  359  --   LABPROT 13.4  --   --   --   INR 1.03  --   --   --   HEPARINUNFRC  --  0.14* 0.20* 0.18*  CREATININE 1.55*  --  1.66*  --    Lab Results  Component Value Date   INR 1.03 03/31/2012   INR 1.08 03/01/2012   INR 1.04 02/29/2012   Medications:  Infusions:  . heparin 2,000 Units/hr (04/02/12 0229)  . [DISCONTINUED] heparin 1,450 Units/hr (04/01/12 1800)   Assessment:  52 y/o female s/p L-Femoral Endarterectomy and L-groin wound debridement who has been placed on Heparin for Lower Extremity Ischemia.  Heparin level remains SUBtherapeutic despite infusion rate increases. No significant bleeding complications noted   Goal:  Heparin level 0.3 - 0.7  Plan:  Heparin 2500 unit bolus, the increase Heparin rate to 2300 units/hr.  Next Heparin level in 6 hours. Daily Heparin level and CBC while on Heparin.  Stramoski, Deetta Perla.D 04/02/2012, 11:27 AM

## 2012-04-03 ENCOUNTER — Other Ambulatory Visit: Payer: Self-pay | Admitting: Plastic Surgery

## 2012-04-03 DIAGNOSIS — S3991XA Unspecified injury of abdomen, initial encounter: Secondary | ICD-10-CM

## 2012-04-03 LAB — BASIC METABOLIC PANEL
CO2: 23 mEq/L (ref 19–32)
Chloride: 107 mEq/L (ref 96–112)
Sodium: 141 mEq/L (ref 135–145)

## 2012-04-03 LAB — GLUCOSE, CAPILLARY
Glucose-Capillary: 129 mg/dL — ABNORMAL HIGH (ref 70–99)
Glucose-Capillary: 155 mg/dL — ABNORMAL HIGH (ref 70–99)
Glucose-Capillary: 153 mg/dL — ABNORMAL HIGH (ref 70–99)

## 2012-04-03 LAB — CBC
HCT: 35.6 % — ABNORMAL LOW (ref 36.0–46.0)
MCV: 96 fL (ref 78.0–100.0)
RDW: 13.7 % (ref 11.5–15.5)
WBC: 9.1 10*3/uL (ref 4.0–10.5)

## 2012-04-03 LAB — HEPARIN LEVEL (UNFRACTIONATED)
Heparin Unfractionated: 0.29 IU/mL — ABNORMAL LOW (ref 0.30–0.70)
Heparin Unfractionated: 0.3 IU/mL (ref 0.30–0.70)
Heparin Unfractionated: 0.41 IU/mL (ref 0.30–0.70)

## 2012-04-03 LAB — PREPARE RBC (CROSSMATCH)

## 2012-04-03 NOTE — Progress Notes (Addendum)
[facility] Progress Note  04/03/2012 9:56 AM HD 3  Subjective:  No complaints.  Afebrile x 24 hrs VSS 96%RA   ABx: Vanc/Zosyn  Gtts: Heparin   Filed Vitals:   04/03/12 0418  BP: 128/75  Pulse: 77  Temp: 97.9 F (36.6 C)  Resp: 18    Physical Exam: Cardiac:  regular Lungs:  Non labored Extremities:  Left groin with good granulation tissue.  CBC    Component Value Date/Time   WBC 9.1 04/03/2012 0505   RBC 3.71* 04/03/2012 0505   HGB 11.7* 04/03/2012 0505   HCT 35.6* 04/03/2012 0505   PLT 387 04/03/2012 0505   MCV 96.0 04/03/2012 0505   MCH 31.5 04/03/2012 0505   MCHC 32.9 04/03/2012 0505   RDW 13.7 04/03/2012 0505   LYMPHSABS 3.0 02/05/2007 0319   MONOABS 1.8* 02/05/2007 0319   EOSABS 0.0 02/05/2007 0319   BASOSABS 0.0 02/05/2007 0319    BMET    Component Value Date/Time   NA 141 04/03/2012 0505   K 4.2 04/03/2012 0505   CL 107 04/03/2012 0505   CO2 23 04/03/2012 0505   GLUCOSE 149* 04/03/2012 0505   BUN 15 04/03/2012 0505   CREATININE 1.58* 04/03/2012 0505   CALCIUM 9.1 04/03/2012 0505   GFRNONAA 36* 04/03/2012 0505   GFRAA 42* 04/03/2012 0505    INR    Component Value Date/Time   INR 1.03 03/31/2012 2056     Intake/Output Summary (Last 24 hours) at 04/03/12 0956 Last data filed at 04/03/12 0511  Gross per 24 hour  Intake 2913.1 ml  Output      0 ml  Net 2913.1 ml     Assessment/Plan:  52 y.o. female is   Date of Surgery: Jan 2014  Left common femoral, superficial femoral, profundofemoral endarterectomy with patch angioplasty (bovine)  Feb 1 ,2014 Debridement of skin and subcutaneous fat necrosis    HD 3  -Creatinine improved from yesterday 1.58 from 1.66 -pt for surgery tomorrow with Dr. Myra Gianotti and plastics (muscle flap) -continue ABx -continue heparin-will d/c on call to OR -pt may shower.  May be disconnected from tele and gtts for shower today  [name], PA-C [facility] [phone] 04/03/2012 9:56 AM     Agree with  above  Plan for revascularization tomorrow.  Discussed risks of infection wound care from surgery.  SHe understands.  Appreciate Dr. Leonie Green willingness to participate  Durene Cal

## 2012-04-03 NOTE — Progress Notes (Signed)
ANTICOAGULATION CONSULT NOTE - Follow Up Consult  Pharmacy Consult for Heparin Indication: Lower Extremity Ischemia  Allergies  Allergen Reactions  . Codeine Nausea Only    Patient Measurements: Height: 5\' 7"  (170.2 cm) Weight: 278 lb 10.6 oz (126.4 kg) IBW/kg (Calculated) : 61.6 Heparin Dosing Weight: 92kg  Vital Signs: Temp: 98 F (36.7 C) (03/06 1330) Temp src: Oral (03/06 1330) BP: 104/60 mmHg (03/06 1330) Pulse Rate: 77 (03/06 1330)  Labs:  Recent Labs  03/31/12 2056  04/02/12 0100  04/02/12 2342 04/03/12 0505 04/03/12 0718 04/03/12 1655  HGB 11.9*  --  11.9*  --   --  11.7*  --   --   HCT 34.9*  --  34.9*  --   --  35.6*  --   --   PLT 379  --  359  --   --  387  --   --   LABPROT 13.4  --   --   --   --   --   --   --   INR 1.03  --   --   --   --   --   --   --   HEPARINUNFRC  --   < > 0.20*  < > 0.29*  --  0.30 0.41  CREATININE 1.55*  --  1.66*  --   --  1.58*  --   --   < > = values in this interval not displayed.  Estimated Creatinine Clearance: 56.9 ml/min (by C-G formula based on Cr of 1.58).   Medications:  Heparin @ 2800 units/hr  Assessment: 52 year old female who is s/p left femoral endarterectomy 02/22/12 and left groin wound debridement 03/01/12 continues on heparin for left foot numbness due to ischemia.   Heparin level therapeutic now at 2800 units / hr  Goal of Therapy:  Heparin level 0.3-0.7 units/ml Monitor platelets by anticoagulation protocol: Yes   Plan:  1. Continue heparin at 2800 units / hr  2. Follow up am heparin level, CBC  Thank you. Okey Regal, PharmD [phone]   04/03/2012,5:40 PM

## 2012-04-03 NOTE — Progress Notes (Signed)
ANTICOAGULATION CONSULT NOTE - Follow Up Consult  Pharmacy Consult for Heparin Indication: Lower Extremity Ischemia  Heparin Dosing Weight: 92 kg  Labs:  Recent Labs  03/31/12 2056  04/02/12 0100  04/02/12 1751 04/02/12 2342 04/03/12 0505 04/03/12 0718  HGB 11.9*  --  11.9*  --   --   --  11.7*  --   HCT 34.9*  --  34.9*  --   --   --  35.6*  --   PLT 379  --  359  --   --   --  387  --   LABPROT 13.4  --   --   --   --   --   --   --   INR 1.03  --   --   --   --   --   --   --   HEPARINUNFRC  --   < > 0.20*  < > 0.33 0.29*  --  0.30  CREATININE 1.55*  --  1.66*  --   --   --  1.58*  --     Medications:   Heparin Infusion at 2500 units/hr  Assessment:  Despite Heparin rate increases, Heparin levels remain relatively unchanged at low end of therapeutic range.  If Heparin levels remain low despite further rate increases, may check AT III levels.  Goal of Therapy:  Heparin level 0.3-0.7 units/ml   Plan:  Increase Heparin infusion by ~ 3 units/kg/hour > 2800 units/hr.  Will check next Heparin level in 6 hours.  Stramoski, Deetta Perla.D 04/03/2012, 9:15 AM

## 2012-04-03 NOTE — Progress Notes (Signed)
ANTICOAGULATION CONSULT NOTE - Follow Up Consult  Pharmacy Consult for Heparin Indication: Lower Extremity Ischemia  Allergies  Allergen Reactions  . Codeine Nausea Only    Patient Measurements: Height: 5\' 7"  (170.2 cm) Weight: 278 lb 10.6 oz (126.4 kg) IBW/kg (Calculated) : 61.6 Heparin Dosing Weight: 92kg  Vital Signs: Temp: 97.7 F (36.5 C) (03/05 2101) Temp src: Oral (03/05 2101) BP: 142/72 mmHg (03/05 2101) Pulse Rate: 95 (03/05 2101)  Labs:  Recent Labs  03/31/12 2056  04/02/12 0100 04/02/12 0836 04/02/12 1751 04/02/12 2342  HGB 11.9*  --  11.9*  --   --   --   HCT 34.9*  --  34.9*  --   --   --   PLT 379  --  359  --   --   --   LABPROT 13.4  --   --   --   --   --   INR 1.03  --   --   --   --   --   HEPARINUNFRC  --   < > 0.20* 0.18* 0.33 0.29*  CREATININE 1.55*  --  1.66*  --   --   --   < > = values in this interval not displayed.  Estimated Creatinine Clearance: 54.1 ml/min (by C-G formula based on Cr of 1.66).   Medications:  Heparin @ 2300 units/hr  Assessment: 52 year old female who is s/p left femoral endarterectomy 02/22/12 and left groin wound debridement 03/01/12 continues on heparin for left foot numbness due to ischemia.   Heparin level (0.29) is slightly-below goal on 2300 units/hr. No problem with line / infusion and no bleeding per RN.  Goal of Therapy:  Heparin level 0.3-0.7 units/ml Monitor platelets by anticoagulation protocol: Yes   Plan:  1. Increase IV heparin to 2500 units/hr.  2. Heparin level in 6 hours.   Emeline Gins 04/03/2012,1:01 AM

## 2012-04-04 ENCOUNTER — Encounter (HOSPITAL_COMMUNITY)
Admission: AD | Disposition: A | Discharge status: Home / Self Care | Payer: Self-pay | Source: Ambulatory Visit | Attending: Surgery

## 2012-04-04 ENCOUNTER — Encounter (HOSPITAL_COMMUNITY): Payer: Self-pay | Admitting: Plastic Surgery

## 2012-04-04 ENCOUNTER — Inpatient Hospital Stay (HOSPITAL_COMMUNITY): Payer: Medicare Other | Admitting: Anesthesiology

## 2012-04-04 ENCOUNTER — Encounter (HOSPITAL_COMMUNITY): Payer: Self-pay | Admitting: Anesthesiology

## 2012-04-04 DIAGNOSIS — E119 Type 2 diabetes mellitus without complications: Secondary | ICD-10-CM | POA: Diagnosis not present

## 2012-04-04 DIAGNOSIS — T82898A Other specified complication of vascular prosthetic devices, implants and grafts, initial encounter: Secondary | ICD-10-CM | POA: Diagnosis not present

## 2012-04-04 DIAGNOSIS — K219 Gastro-esophageal reflux disease without esophagitis: Secondary | ICD-10-CM | POA: Diagnosis not present

## 2012-04-04 DIAGNOSIS — I743 Embolism and thrombosis of arteries of the lower extremities: Secondary | ICD-10-CM

## 2012-04-04 DIAGNOSIS — I999 Unspecified disorder of circulatory system: Secondary | ICD-10-CM | POA: Diagnosis not present

## 2012-04-04 DIAGNOSIS — I509 Heart failure, unspecified: Secondary | ICD-10-CM | POA: Diagnosis not present

## 2012-04-04 DIAGNOSIS — I739 Peripheral vascular disease, unspecified: Secondary | ICD-10-CM | POA: Diagnosis not present

## 2012-04-04 HISTORY — PX: MUSCLE FLAP CLOSURE: SHX2054

## 2012-04-04 HISTORY — PX: PATCH ANGIOPLASTY: SHX6230

## 2012-04-04 HISTORY — PX: AORTOGRAM: SHX6300

## 2012-04-04 LAB — CBC
MCH: 32.1 pg (ref 26.0–34.0)
Platelets: 347 10*3/uL (ref 150–400)
RBC: 3.55 MIL/uL — ABNORMAL LOW (ref 3.87–5.11)
WBC: 8.7 10*3/uL (ref 4.0–10.5)

## 2012-04-04 LAB — GLUCOSE, CAPILLARY
Glucose-Capillary: 227 mg/dL — ABNORMAL HIGH (ref 70–99)
Glucose-Capillary: 168 mg/dL — ABNORMAL HIGH (ref 70–99)

## 2012-04-04 LAB — HEPARIN LEVEL (UNFRACTIONATED): Heparin Unfractionated: <0.1 IU/mL — ABNORMAL LOW (ref 0.30–0.70)

## 2012-04-04 LAB — GRAM STAIN

## 2012-04-04 LAB — BASIC METABOLIC PANEL
Chloride: 108 mEq/L (ref 96–112)
Creatinine, Ser: 1.46 mg/dL — ABNORMAL HIGH (ref 0.50–1.10)
GFR calc Af Amer: 46 mL/min — ABNORMAL LOW (ref >90)

## 2012-04-04 SURGERY — THROMBECTOMY, ARTERY, FEMORAL
Anesthesia: General | Site: Leg Upper | Laterality: Left | Wound class: Dirty or Infected

## 2012-04-04 MED ORDER — LIDOCAINE-EPINEPHRINE (PF) 1 %-1:200000 IJ SOLN
INTRAMUSCULAR | Status: AC
  Filled 2012-04-04: qty 10

## 2012-04-04 MED ORDER — LIDOCAINE-EPINEPHRINE 1 %-1:100000 IJ SOLN
INTRAMUSCULAR | Status: DC | PRN
  Administered 2012-04-04: 20 mL

## 2012-04-04 MED ORDER — MIDAZOLAM HCL 5 MG/5ML IJ SOLN
INTRAMUSCULAR | Status: DC | PRN
  Administered 2012-04-04: 2 mg via INTRAVENOUS

## 2012-04-04 MED ORDER — PROPOFOL 10 MG/ML IV BOLUS
INTRAVENOUS | Status: DC | PRN
  Administered 2012-04-04: 110 mg via INTRAVENOUS

## 2012-04-04 MED ORDER — SODIUM CHLORIDE 0.9 % IR SOLN
Status: DC | PRN
  Administered 2012-04-04: 10:00:00

## 2012-04-04 MED ORDER — PIPERACILLIN-TAZOBACTAM 3.375 G IVPB 30 MIN
3.3750 g | INTRAVENOUS | Status: AC
  Administered 2012-04-04: 3.375 g via INTRAVENOUS
  Filled 2012-04-04: qty 50

## 2012-04-04 MED ORDER — LIDOCAINE HCL 4 % MT SOLN
OROMUCOSAL | Status: DC | PRN
  Administered 2012-04-04: 4 mL via TOPICAL

## 2012-04-04 MED ORDER — 0.9 % SODIUM CHLORIDE (POUR BTL) OPTIME
TOPICAL | Status: DC | PRN
  Administered 2012-04-04: 3000 mL

## 2012-04-04 MED ORDER — EVICEL 5 ML EX KIT
PACK | CUTANEOUS | Status: AC
  Filled 2012-04-04: qty 1

## 2012-04-04 MED ORDER — HEPARIN SODIUM (PORCINE) 1000 UNIT/ML IJ SOLN
INTRAMUSCULAR | Status: DC | PRN
  Administered 2012-04-04 (×2): 3000 [IU] via INTRAVENOUS
  Administered 2012-04-04: 10000 [IU] via INTRAVENOUS

## 2012-04-04 MED ORDER — SODIUM CHLORIDE 0.9 % IV SOLN
INTRAVENOUS | Status: DC
  Administered 2012-04-04: 20:00:00 via INTRAVENOUS
  Administered 2012-04-05: 10 mL via INTRAVENOUS

## 2012-04-04 MED ORDER — ARTIFICIAL TEARS OP OINT
TOPICAL_OINTMENT | OPHTHALMIC | Status: DC | PRN
  Administered 2012-04-04: 1 via OPHTHALMIC

## 2012-04-04 MED ORDER — BUPIVACAINE HCL (PF) 0.25 % IJ SOLN
INTRAMUSCULAR | Status: AC
  Filled 2012-04-04: qty 30

## 2012-04-04 MED ORDER — OXYCODONE HCL 5 MG PO TABS
ORAL_TABLET | ORAL | Status: AC
  Administered 2012-04-04: 5 mg via ORAL
  Filled 2012-04-04: qty 1

## 2012-04-04 MED ORDER — LIDOCAINE HCL (CARDIAC) 20 MG/ML IV SOLN
INTRAVENOUS | Status: DC | PRN
  Administered 2012-04-04: 100 mg via INTRAVENOUS

## 2012-04-04 MED ORDER — GLIPIZIDE 5 MG PO TABS
5.0000 mg | ORAL_TABLET | Freq: Two times a day (BID) | ORAL | Status: DC
  Administered 2012-04-05 – 2012-04-09 (×9): 5 mg via ORAL
  Filled 2012-04-04 (×11): qty 1

## 2012-04-04 MED ORDER — LABETALOL HCL 5 MG/ML IV SOLN
INTRAVENOUS | Status: DC | PRN
  Administered 2012-04-04: 10 mg via INTRAVENOUS

## 2012-04-04 MED ORDER — ONDANSETRON HCL 4 MG/2ML IJ SOLN: 4.0000 mg | Freq: Once | INTRAMUSCULAR | Status: DC | PRN

## 2012-04-04 MED ORDER — IOHEXOL 300 MG/ML  SOLN
INTRAMUSCULAR | Status: DC | PRN
  Administered 2012-04-04: 50 mL via INTRA_ARTERIAL

## 2012-04-04 MED ORDER — ONDANSETRON HCL 4 MG/2ML IJ SOLN
INTRAMUSCULAR | Status: DC | PRN
  Administered 2012-04-04: 4 mg via INTRAVENOUS

## 2012-04-04 MED ORDER — BACITRACIN-NEOMYCIN-POLYMYXIN OINTMENT TUBE
TOPICAL_OINTMENT | Freq: Two times a day (BID) | CUTANEOUS | Status: DC
  Administered 2012-04-05 – 2012-04-09 (×9): via TOPICAL
  Filled 2012-04-04: qty 15

## 2012-04-04 MED ORDER — LIDOCAINE-EPINEPHRINE 1 %-1:100000 IJ SOLN
INTRAMUSCULAR | Status: AC
  Filled 2012-04-04: qty 1

## 2012-04-04 MED ORDER — OXYCODONE HCL 5 MG PO TABS: 5.0000 mg | ORAL_TABLET | Freq: Once | ORAL | Status: AC | PRN

## 2012-04-04 MED ORDER — PHENYLEPHRINE HCL 10 MG/ML IJ SOLN
INTRAMUSCULAR | Status: DC | PRN
  Administered 2012-04-04 (×3): 80 ug via INTRAVENOUS
  Administered 2012-04-04: 40 ug via INTRAVENOUS

## 2012-04-04 MED ORDER — LACTATED RINGERS IV SOLN
INTRAVENOUS | Status: DC | PRN
  Administered 2012-04-04: 08:00:00 via INTRAVENOUS

## 2012-04-04 MED ORDER — HYDROMORPHONE HCL PF 1 MG/ML IJ SOLN
INTRAMUSCULAR | Status: AC
  Filled 2012-04-04: qty 1

## 2012-04-04 MED ORDER — OXYCODONE HCL 5 MG/5ML PO SOLN: 5.0000 mg | Freq: Once | ORAL | Status: AC | PRN

## 2012-04-04 MED ORDER — ROCURONIUM BROMIDE 100 MG/10ML IV SOLN
INTRAVENOUS | Status: DC | PRN
  Administered 2012-04-04: 30 mg via INTRAVENOUS
  Administered 2012-04-04: 20 mg via INTRAVENOUS
  Administered 2012-04-04 (×2): 50 mg via INTRAVENOUS

## 2012-04-04 MED ORDER — BACITRACIN-NEOMYCIN-POLYMYXIN 400-5-5000 EX OINT
TOPICAL_OINTMENT | CUTANEOUS | Status: DC | PRN
  Administered 2012-04-04: 1 via TOPICAL

## 2012-04-04 MED ORDER — HYDROMORPHONE HCL PF 1 MG/ML IJ SOLN
0.2500 mg | INTRAMUSCULAR | Status: DC | PRN
  Administered 2012-04-04 (×2): 0.25 mg via INTRAVENOUS

## 2012-04-04 MED ORDER — FENTANYL CITRATE 0.05 MG/ML IJ SOLN
INTRAMUSCULAR | Status: DC | PRN
  Administered 2012-04-04 (×2): 150 ug via INTRAVENOUS
  Administered 2012-04-04 (×2): 100 ug via INTRAVENOUS
  Administered 2012-04-04: 25 ug via INTRAVENOUS
  Administered 2012-04-04: 150 ug via INTRAVENOUS
  Administered 2012-04-04: 100 ug via INTRAVENOUS

## 2012-04-04 MED ORDER — BACITRACIN-NEOMYCIN-POLYMYXIN 400-5-5000 EX OINT
TOPICAL_OINTMENT | CUTANEOUS | Status: AC
  Filled 2012-04-04: qty 1

## 2012-04-04 MED ORDER — MEPERIDINE HCL 25 MG/ML IJ SOLN: 6.2500 mg | INTRAMUSCULAR | Status: DC | PRN

## 2012-04-04 SURGICAL SUPPLY — 104 items
ADH SKN CLS APL DERMABOND .7 (GAUZE/BANDAGES/DRESSINGS) ×1
BAG SNAP BAND KOVER 36X36 (MISCELLANEOUS) ×1 IMPLANT
BALLN MUSTANG 7.0X40 75 (BALLOONS) ×2
BALLOON MUSTANG 7.0X40 75 (BALLOONS) IMPLANT
BANDAGE ELASTIC 4 VELCRO ST LF (GAUZE/BANDAGES/DRESSINGS) IMPLANT
BANDAGE ESMARK 6X9 LF (GAUZE/BANDAGES/DRESSINGS) IMPLANT
BINDER BREAST XXLRG (GAUZE/BANDAGES/DRESSINGS) ×1 IMPLANT
BLADE SURG 10 STRL SS (BLADE) ×1 IMPLANT
BNDG CMPR 9X6 STRL LF SNTH (GAUZE/BANDAGES/DRESSINGS)
BNDG ESMARK 6X9 LF (GAUZE/BANDAGES/DRESSINGS)
CANISTER SUCTION 2500CC (MISCELLANEOUS) ×2 IMPLANT
CATH EMB 3FR 80CM (CATHETERS) ×1 IMPLANT
CATH EMB 4FR 80CM (CATHETERS) ×1 IMPLANT
CATH EMB 5FR 80CM (CATHETERS) ×1 IMPLANT
CLIP TI MEDIUM 24 (CLIP) ×2 IMPLANT
CLIP TI WIDE RED SMALL 24 (CLIP) ×2 IMPLANT
CLOTH BEACON ORANGE TIMEOUT ST (SAFETY) ×2 IMPLANT
COVER DOME SNAP 22 D (MISCELLANEOUS) ×1 IMPLANT
COVER SURGICAL LIGHT HANDLE (MISCELLANEOUS) ×2 IMPLANT
CUFF TOURNIQUET SINGLE 24IN (TOURNIQUET CUFF) IMPLANT
CUFF TOURNIQUET SINGLE 34IN LL (TOURNIQUET CUFF) IMPLANT
CUFF TOURNIQUET SINGLE 44IN (TOURNIQUET CUFF) IMPLANT
DECANTER SPIKE VIAL GLASS SM (MISCELLANEOUS) ×1 IMPLANT
DERMABOND ADVANCED (GAUZE/BANDAGES/DRESSINGS) ×1
DERMABOND ADVANCED .7 DNX12 (GAUZE/BANDAGES/DRESSINGS) ×1 IMPLANT
DRAIN CHANNEL 15F RND FF W/TCR (WOUND CARE) ×1 IMPLANT
DRAIN CHANNEL 19F RND (DRAIN) ×1 IMPLANT
DRAPE WARM FLUID 44X44 (DRAPE) ×2 IMPLANT
DRAPE X-RAY CASS 24X20 (DRAPES) IMPLANT
DRSG COVADERM 4X10 (GAUZE/BANDAGES/DRESSINGS) IMPLANT
DRSG COVADERM 4X8 (GAUZE/BANDAGES/DRESSINGS) IMPLANT
DRSG MEPILEX BORDER 4X8 (GAUZE/BANDAGES/DRESSINGS) ×1 IMPLANT
DRSG PAD ABDOMINAL 8X10 ST (GAUZE/BANDAGES/DRESSINGS) ×1 IMPLANT
ELECT REM PT RETURN 9FT ADLT (ELECTROSURGICAL) ×2
ELECTRODE REM PT RTRN 9FT ADLT (ELECTROSURGICAL) ×1 IMPLANT
EVACUATOR SILICONE 100CC (DRAIN) ×2 IMPLANT
GAUZE XEROFORM 5X9 LF (GAUZE/BANDAGES/DRESSINGS) ×1 IMPLANT
GLOVE BIO SURGEON STRL SZ 6.5 (GLOVE) ×2 IMPLANT
GLOVE BIOGEL PI IND STRL 6 (GLOVE) IMPLANT
GLOVE BIOGEL PI IND STRL 6.5 (GLOVE) IMPLANT
GLOVE BIOGEL PI IND STRL 7.0 (GLOVE) IMPLANT
GLOVE BIOGEL PI IND STRL 7.5 (GLOVE) ×1 IMPLANT
GLOVE BIOGEL PI INDICATOR 6 (GLOVE) ×3
GLOVE BIOGEL PI INDICATOR 6.5 (GLOVE) ×2
GLOVE BIOGEL PI INDICATOR 7.0 (GLOVE) ×2
GLOVE BIOGEL PI INDICATOR 7.5 (GLOVE) ×3
GLOVE ECLIPSE 7.5 STRL STRAW (GLOVE) ×3 IMPLANT
GLOVE SURG SS PI 7.5 STRL IVOR (GLOVE) ×4 IMPLANT
GOWN PREVENTION PLUS XLARGE (GOWN DISPOSABLE) ×1 IMPLANT
GOWN PREVENTION PLUS XXLARGE (GOWN DISPOSABLE) ×7 IMPLANT
GOWN STRL NON-REIN LRG LVL3 (GOWN DISPOSABLE) ×6 IMPLANT
HEMOSTAT SNOW SURGICEL 2X4 (HEMOSTASIS) IMPLANT
HEMOSTAT SURGICEL 2X14 (HEMOSTASIS) IMPLANT
KIT BASIN OR (CUSTOM PROCEDURE TRAY) ×2 IMPLANT
KIT ENCORE 26 ADVANTAGE (KITS) ×1 IMPLANT
KIT ROOM TURNOVER OR (KITS) ×2 IMPLANT
LOOP VESSEL MINI RED (MISCELLANEOUS) ×1 IMPLANT
MARKER GRAFT CORONARY BYPASS (MISCELLANEOUS) IMPLANT
NDL HYPO 25GX1X1/2 BEV (NEEDLE) IMPLANT
NEEDLE HYPO 25GX1X1/2 BEV (NEEDLE) ×2 IMPLANT
NS IRRIG 1000ML POUR BTL (IV SOLUTION) ×5 IMPLANT
PACK PERIPHERAL VASCULAR (CUSTOM PROCEDURE TRAY) ×2 IMPLANT
PAD ARMBOARD 7.5X6 YLW CONV (MISCELLANEOUS) ×4 IMPLANT
PADDING CAST COTTON 6X4 STRL (CAST SUPPLIES) IMPLANT
PATCH CORMATRIX 4CMX7CM (Prosthesis & Implant Heart) ×1 IMPLANT
SET COLLECT BLD 21X3/4 12 (NEEDLE) ×1 IMPLANT
SHEATH BRITE TIP 8FR 23CM (MISCELLANEOUS) ×1 IMPLANT
SPONGE GAUZE 4X4 12PLY (GAUZE/BANDAGES/DRESSINGS) ×1 IMPLANT
SPONGE LAP 18X18 X RAY DECT (DISPOSABLE) ×1 IMPLANT
STAPLER VISISTAT 35W (STAPLE) IMPLANT
STOPCOCK 4 WAY LG BORE MALE ST (IV SETS) ×1 IMPLANT
SUT ETHILON 3 0 PS 1 (SUTURE) IMPLANT
SUT MNCRL AB 3-0 PS2 18 (SUTURE) ×5 IMPLANT
SUT MNCRL AB 4-0 PS2 18 (SUTURE) ×6 IMPLANT
SUT MON AB 5-0 PS2 18 (SUTURE) ×4 IMPLANT
SUT PROLENE 5 0 C 1 24 (SUTURE) ×3 IMPLANT
SUT PROLENE 6 0 BV (SUTURE) ×3 IMPLANT
SUT PROLENE 7 0 BV 1 (SUTURE) ×2 IMPLANT
SUT PROLENE 7 0 BV1 MDA (SUTURE) ×2 IMPLANT
SUT SILK 2 0 SH (SUTURE) ×2 IMPLANT
SUT SILK 3 0 (SUTURE)
SUT SILK 3-0 18XBRD TIE 12 (SUTURE) IMPLANT
SUT VIC AB 2-0 CT1 27 (SUTURE) ×4
SUT VIC AB 2-0 CT1 TAPERPNT 27 (SUTURE) ×2 IMPLANT
SUT VIC AB 3-0 PS2 18 (SUTURE) ×2
SUT VIC AB 3-0 PS2 18XBRD (SUTURE) IMPLANT
SUT VIC AB 3-0 SH 27 (SUTURE) ×4
SUT VIC AB 3-0 SH 27X BRD (SUTURE) ×2 IMPLANT
SUT VICRYL 4-0 PS2 18IN ABS (SUTURE) ×4 IMPLANT
SUT VICRYL AB 2 0 TIES (SUTURE) ×1 IMPLANT
SWAB COLLECTION DEVICE MRSA (MISCELLANEOUS) ×1 IMPLANT
SYR 3ML LL SCALE MARK (SYRINGE) ×1 IMPLANT
SYR CONTROL 10ML LL (SYRINGE) ×1 IMPLANT
SYR TB 1ML LUER SLIP (SYRINGE) ×1 IMPLANT
SYRINGE 10CC LL (SYRINGE) ×2 IMPLANT
TAPE CLOTH SURG 4X10 WHT LF (GAUZE/BANDAGES/DRESSINGS) ×1 IMPLANT
TOWEL OR 17X24 6PK STRL BLUE (TOWEL DISPOSABLE) ×4 IMPLANT
TOWEL OR 17X26 10 PK STRL BLUE (TOWEL DISPOSABLE) ×4 IMPLANT
TRAY FOLEY CATH 14FRSI W/METER (CATHETERS) ×2 IMPLANT
TUBE ANAEROBIC SPECIMEN COL (MISCELLANEOUS) ×1 IMPLANT
TUBING EXTENTION W/L.L. (IV SETS) ×1 IMPLANT
UNDERPAD 30X30 INCONTINENT (UNDERPADS AND DIAPERS) ×2 IMPLANT
WATER STERILE IRR 1000ML POUR (IV SOLUTION) ×3 IMPLANT
WIRE BENTSON .035X145CM (WIRE) ×1 IMPLANT

## 2012-04-04 NOTE — Anesthesia Postprocedure Evaluation (Signed)
Anesthesia Post Note  Patient: Stephanie Peck  Procedure(s) Performed: Procedure(s) (LRB): THROMBECTOMY FEMORAL ARTERY (Left) AORTOGRAM (Left) MUSCLE FLAP CLOSURE (Left) PATCH ANGIOPLASTY (Left)  Anesthesia type: general  Patient location: PACU  Post pain: Pain level controlled  Post assessment: Patient's Cardiovascular Status Stable  Last Vitals:  Filed Vitals:   04/04/12 1800  BP: 144/53  Pulse:   Temp:   Resp:     Post vital signs: Reviewed and stable  Level of consciousness: sedated  Complications: No apparent anesthesia complications

## 2012-04-04 NOTE — Transfer of Care (Signed)
Immediate Anesthesia Transfer of Care Note  Patient: Stephanie Peck  Procedure(s) Performed: Procedure(s): THROMBECTOMY FEMORAL ARTERY (Left) AORTOGRAM (Left) MUSCLE FLAP CLOSURE (Left) PATCH ANGIOPLASTY (Left)  Patient Location: PACU  Anesthesia Type:General  Level of Consciousness: awake and patient cooperative  Airway & Oxygen Therapy: Patient Spontanous Breathing and Patient connected to face mask oxygen  Post-op Assessment: Report given to PACU RN and Post -op Vital signs reviewed and stable  Post vital signs: Reviewed and stable  Complications: No apparent anesthesia complications

## 2012-04-04 NOTE — Preoperative (Signed)
Beta Blockers   Reason not to administer Beta Blockers:Not Applicable 

## 2012-04-04 NOTE — Anesthesia Preprocedure Evaluation (Signed)
Anesthesia Evaluation  Patient identified by MRN, date of birth, ID band Patient awake    Reviewed: Allergy & Precautions, H&P , NPO status , Patient's Chart, lab work & pertinent test results  Airway Mallampati: I TM Distance: >3 FB Neck ROM: Full    Dental   Pulmonary sleep apnea , COPD         Cardiovascular hypertension,     Neuro/Psych    GI/Hepatic GERD-  Medicated and Controlled,  Endo/Other  diabetes, Type 2, Oral Hypoglycemic Agents and Insulin Dependent  Renal/GU      Musculoskeletal   Abdominal   Peds  Hematology   Anesthesia Other Findings   Reproductive/Obstetrics                           Anesthesia Physical Anesthesia Plan  ASA: III  Anesthesia Plan: General   Post-op Pain Management:    Induction: Intravenous  Airway Management Planned: Oral ETT  Additional Equipment: Arterial line  Intra-op Plan:   Post-operative Plan: Extubation in OR  Informed Consent: I have reviewed the patients History and Physical, chart, labs and discussed the procedure including the risks, benefits and alternatives for the proposed anesthesia with the patient or authorized representative who has indicated his/her understanding and acceptance.     Plan Discussed with: CRNA and Surgeon  Anesthesia Plan Comments:         Anesthesia Quick Evaluation

## 2012-04-04 NOTE — Progress Notes (Signed)
This note also relates to the following rows which could not be included: Pulse Rate - Cannot attach notes to unvalidated device data ECG Heart Rate - Cannot attach notes to unvalidated device data Resp - Cannot attach notes to unvalidated device data   Dr. Linnell Fulling at beside spoke with patient given husband's telephone #

## 2012-04-04 NOTE — Progress Notes (Signed)
Set up pt with auto CPAP and 2L bled in(per home use). Pt will place on when ready. Pt encouraged to call RT if any questions or problems

## 2012-04-04 NOTE — Brief Op Note (Signed)
03/31/2012 - 04/04/2012  3:45 PM  PATIENT:  Stephanie Peck  52 y.o. female  PRE-OPERATIVE DIAGNOSIS:  Claudication  POST-OPERATIVE DIAGNOSIS:  Claudication  PROCEDURE:  Procedure(s): THROMBECTOMY FEMORAL ARTERY (Left) AORTOGRAM (Left) MUSCLE FLAP CLOSURE (Left) PATCH ANGIOPLASTY (Left) Left rectus femoris muscle flap  SURGEON:  Surgeon(s) and Role: Panel 1:    * Nada Libman, MD - Primary  Panel 2:    * [name], DO - Primary  PHYSICIAN ASSISTANT: none  ASSISTANTS: none   ANESTHESIA:   general  EBL:  Total I/O In: 2600 [I.V.:2600] Out: 1150 [Urine:700; Blood:450]  BLOOD ADMINISTERED:none  DRAINS: (2) Jackson-Pratt drain(s) with closed bulb suction in the left groin and one in the left leg   LOCAL MEDICATIONS USED:  LIDOCAINE   SPECIMEN:  No Specimen  DISPOSITION OF SPECIMEN:  N/A  COUNTS:  YES  TOURNIQUET:  * No tourniquets in log *  DICTATION: .Dragon Dictation  PLAN OF CARE: Admit to inpatient   PATIENT DISPOSITION:  PACU - hemodynamically stable.   Delay start of Pharmacological VTE agent (>24hrs) due to surgical blood loss or risk of bleeding: no

## 2012-04-04 NOTE — H&P (View-Only) (Signed)
Reason for Consult:left groin exposed Referring Physician: Dr. Trinidad Curet is an 52 y.o. female.  HPI: The patient is a 52 yrs old wf here with Vascular surgery for repair of a femoral graft.  There is exposed graft material for which we will plan to cover.  She underwent a vascular repair in January with breakdown of the surrounding tissue.  There is not an infection of the graft so far as we know at this time.    Past Medical History  Diagnosis Date  . Diabetes mellitus   . GERD (gastroesophageal reflux disease)   . Anxiety   . CHF (congestive heart failure)   . COPD (chronic obstructive pulmonary disease)   . Sleep apnea     severe OSA by 09/08/06 sleep study ([facility])  . Depression     Past Surgical History  Procedure Laterality Date  . Cholecystectomy    . Gastric bypass  2006  . Tonsillectomy    . Femoral artery stent  12/05/2011    right superficial   . Endarterectomy femoral  02/22/2012    Procedure: ENDARTERECTOMY FEMORAL;  Surgeon: Nada Libman, MD;  Location: [facility] OR;  Service: Vascular;  Laterality: Left;  . Patch angioplasty  02/22/2012    Procedure: PATCH ANGIOPLASTY;  Surgeon: Nada Libman, MD;  Location: [facility] OR;  Service: Vascular;  Laterality: Left;  left femoral patch angioplasty  . Application of wound vac  02/22/2012    Procedure: APPLICATION OF WOUND VAC;  Surgeon: Nada Libman, MD;  Location: [facility] OR;  Service: Vascular;  Laterality: Left;  application wound vac  . Groin debridement  03/01/2012    Procedure: GROIN DEBRIDEMENT;  Surgeon: Larina Earthly, MD;  Location: [facility] OR;  Service: Vascular;  Laterality: Left;    Family History  Problem Relation Age of Onset  . Cancer Mother   . Depression Mother   . Heart disease Father     before age 38  . Diabetes Father   . Hypertension Father   . Hyperlipidemia Father   . Heart attack Father   . Cancer Maternal Grandmother     Social History:  reports that she quit smoking about 5 months ago. Her  smoking use included Cigarettes. She started smoking about 6 months ago. She has a 40 pack-year smoking history. She has never used smokeless tobacco. She reports that she does not drink alcohol or use illicit drugs.  Allergies:  Allergies  Allergen Reactions  . Codeine Nausea Only    Medications: I have reviewed the patient's current medications.  Results for orders placed during the hospital encounter of 03/31/12 (from the past 48 hour(s))  HEPARIN LEVEL (UNFRACTIONATED)     Status: Abnormal   Collection Time    04/02/12  8:36 AM      Result Value Range   Heparin Unfractionated 0.18 (*) 0.30 - 0.70 IU/mL   Comment:            IF HEPARIN RESULTS ARE BELOW     EXPECTED VALUES, AND PATIENT     DOSAGE HAS BEEN CONFIRMED,     SUGGEST FOLLOW UP TESTING     OF ANTITHROMBIN III LEVELS.  GLUCOSE, CAPILLARY     Status: Abnormal   Collection Time    04/02/12 11:24 AM      Result Value Range   Glucose-Capillary 152 (*) 70 - 99 mg/dL   Comment 1 Notify RN    HEPARIN LEVEL (UNFRACTIONATED)     Status:  None   Collection Time    04/02/12  5:51 PM      Result Value Range   Heparin Unfractionated 0.33  0.30 - 0.70 IU/mL   Comment:            IF HEPARIN RESULTS ARE BELOW     EXPECTED VALUES, AND PATIENT     DOSAGE HAS BEEN CONFIRMED,     SUGGEST FOLLOW UP TESTING     OF ANTITHROMBIN III LEVELS.  GLUCOSE, CAPILLARY     Status: Abnormal   Collection Time    04/02/12  9:06 PM      Result Value Range   Glucose-Capillary 170 (*) 70 - 99 mg/dL  HEPARIN LEVEL (UNFRACTIONATED)     Status: Abnormal   Collection Time    04/02/12 11:42 PM      Result Value Range   Heparin Unfractionated 0.29 (*) 0.30 - 0.70 IU/mL   Comment:            IF HEPARIN RESULTS ARE BELOW     EXPECTED VALUES, AND PATIENT     DOSAGE HAS BEEN CONFIRMED,     SUGGEST FOLLOW UP TESTING     OF ANTITHROMBIN III LEVELS.  CBC     Status: Abnormal   Collection Time    04/03/12  5:05 AM      Result Value Range   WBC 9.1   4.0 - 10.5 K/uL   RBC 3.71 (*) 3.87 - 5.11 MIL/uL   Hemoglobin 11.7 (*) 12.0 - 15.0 g/dL   HCT 16.1 (*) 09.6 - 04.5 %   MCV 96.0  78.0 - 100.0 fL   MCH 31.5  26.0 - 34.0 pg   MCHC 32.9  30.0 - 36.0 g/dL   RDW 40.9  81.1 - 91.4 %   Platelets 387  150 - 400 K/uL  BASIC METABOLIC PANEL     Status: Abnormal   Collection Time    04/03/12  5:05 AM      Result Value Range   Sodium 141  135 - 145 mEq/L   Potassium 4.2  3.5 - 5.1 mEq/L   Chloride 107  96 - 112 mEq/L   CO2 23  19 - 32 mEq/L   Glucose, Bld 149 (*) 70 - 99 mg/dL   BUN 15  6 - 23 mg/dL   Creatinine, Ser 7.82 (*) 0.50 - 1.10 mg/dL   Calcium 9.1  8.4 - 95.6 mg/dL   GFR calc non Af Amer 36 (*) >90 mL/min   GFR calc Af Amer 42 (*) >90 mL/min   Comment:            The eGFR has been calculated     using the CKD EPI equation.     This calculation has not been     validated in all clinical     situations.     eGFR's persistently     <90 mL/min signify     possible Chronic Kidney Disease.  GLUCOSE, CAPILLARY     Status: Abnormal   Collection Time    04/03/12  5:52 AM      Result Value Range   Glucose-Capillary 129 (*) 70 - 99 mg/dL  HEPARIN LEVEL (UNFRACTIONATED)     Status: None   Collection Time    04/03/12  7:18 AM      Result Value Range   Heparin Unfractionated 0.30  0.30 - 0.70 IU/mL   Comment:  IF HEPARIN RESULTS ARE BELOW     EXPECTED VALUES, AND PATIENT     DOSAGE HAS BEEN CONFIRMED,     SUGGEST FOLLOW UP TESTING     OF ANTITHROMBIN III LEVELS.  TYPE AND SCREEN     Status: None   Collection Time    04/03/12 10:30 AM      Result Value Range   ABO/RH(D) O POS     Antibody Screen NEG     Sample Expiration 04/06/2012     Unit Number [id number]     Blood Component Type RED CELLS,LR     Unit division 00     Status of Unit ALLOCATED     Transfusion Status OK TO TRANSFUSE     Crossmatch Result Compatible     Unit Number [id number]     Blood Component Type RED CELLS,LR     Unit division 00       Status of Unit ALLOCATED     Transfusion Status OK TO TRANSFUSE     Crossmatch Result Compatible    GLUCOSE, CAPILLARY     Status: Abnormal   Collection Time    04/03/12 11:27 AM      Result Value Range   Glucose-Capillary 171 (*) 70 - 99 mg/dL   Comment 1 Notify RN    GLUCOSE, CAPILLARY     Status: Abnormal   Collection Time    04/03/12  4:33 PM      Result Value Range   Glucose-Capillary 155 (*) 70 - 99 mg/dL   Comment 1 Documented in Chart     Comment 2 Notify RN    HEPARIN LEVEL (UNFRACTIONATED)     Status: None   Collection Time    04/03/12  4:55 PM      Result Value Range   Heparin Unfractionated 0.41  0.30 - 0.70 IU/mL   Comment:            IF HEPARIN RESULTS ARE BELOW     EXPECTED VALUES, AND PATIENT     DOSAGE HAS BEEN CONFIRMED,     SUGGEST FOLLOW UP TESTING     OF ANTITHROMBIN III LEVELS.  GLUCOSE, CAPILLARY     Status: Abnormal   Collection Time    04/03/12  9:52 PM      Result Value Range   Glucose-Capillary 153 (*) 70 - 99 mg/dL   Comment 1 Documented in Chart     Comment 2 Notify RN    GLUCOSE, CAPILLARY     Status: Abnormal   Collection Time    04/04/12  6:30 AM      Result Value Range   Glucose-Capillary 132 (*) 70 - 99 mg/dL  PREPARE RBC (CROSSMATCH)     Status: None   Collection Time    04/04/12 10:15 AM      Result Value Range   Order Confirmation ORDER PROCESSED BY BLOOD BANK      No results found.  ROS Blood pressure 138/67, pulse 90, temperature 97.9 F (36.6 C), temperature source Oral, resp. rate 18, height 5\' 7"  (1.702 m), weight 126.4 kg (278 lb 10.6 oz), SpO2 97.00%. Physical Exam  Constitutional: She appears well-developed and well-nourished.  HENT:  Head: Normocephalic and atraumatic.  Eyes: Conjunctivae and EOM are normal. Pupils are equal, round, and reactive to light.  Cardiovascular: Normal rate.   Respiratory: Effort normal.  GI: Soft.  Musculoskeletal:       Legs: Neurological: She is alert.  Skin: Skin is warm.  Psychiatric: She has a normal mood and affect. Her behavior is normal. Judgment and thought content normal.    Assessment/Plan: Exposed left femoral graft.  Plan for muscle flap coverage.  Risks and complications were reviewed and included infection, bleeding, pain, scar, risk of anesthesia, muscle loss, flap failure. The consent was signed and patient wishes to proceed.  SANGER,CLAIRE 04/04/2012, 7:56 AM

## 2012-04-04 NOTE — OR Nursing (Signed)
Dr. Kelly Splinter started muscle flap closure at 1322.

## 2012-04-04 NOTE — Op Note (Signed)
Breast Reduction Op note:  Stephanie Peck  ASSISTANT none  PREOPERATIVE DIAGNOSIS 1. Exposed left groin femoral graft  POSTOPERATIVE DIAGNOSIS 1. Exposed left groin femoral graft  PROCEDURES 1. Left rectus femoris muscle flap to groin  COMPLICATIONS: None.  DRAINS: JPs x2 (one in the left leg and one in the left groin)  INDICATIONS FOR PROCEDURE The patient is a 52 y.o. year-old female with an exposed femoral graft.      CONSENT Informed consent was obtained directly from the patient. The risks, benefits and alternatives were fully discussed. Specific risks including but not limited to bleeding, infection, hematoma, seroma, scarring, pain ,loss of the flap and need for further surgery were discussed. The patient had ample opportunity to have her questions answered to her satisfaction.  DESCRIPTION OF PROCEDURE  Patient was brought into the operating room and placed in a supine position.  The patient underwent general anesthesia.  A timeout was performed. Vascular surgery performed their portion of the case which included repair of the femoral artery.  She was then rendered to the plastic and reconstructive surgery team.  The incision lines over the rectus muscle of the left leg were injected with 1% Xylocaine with epinephrine.  After waiting for vasoconstriction, the marked lateral line was incised with a #10 blade.  The bovie was used to dissect down to the rectus muscle.  Once located medial to the vastus lateralis the rectus was freed from the surrounding tissue.  A doppler was used to confirm the descending circumflex femoral artery was patent and confirmed with the angiogram from earlier.  A counter incision was made at the distal leg with 1.5 cm.  The bovie was used to dissect down to the rectus muscle and tendon.  The tendon was transected. Hemostasis was achieved. Once free distally attention was turned to the proximal portion.  The fascia was released and the muscle freed to be  able to rotate into the groin.  The muscle with the skin island was rotated into the groin.    A jp drain was placed in the leg and brought out from the distal incision.  The deep tissues of the left leg were approximated with 3-0 vicryl sutures followed by 4-0 vicryl and the skin was closed with 5-0 Monocryl sutures.  Dermabond was applied.  The drain was secured with a 3-0 silk suture. The distal incision was closed with a running 5-0 monocryl followed by triple antibiotic and xeroform.  The flap had good color and capillary refill.  The lateral portion of the groin was sutured closed with 3-0 monocryl to decrease the size of the defect.  A drain was placed on the lateral aspect of the defect (not over the graft) and secured to the skin with a 3-0 silk. The skin edges were then approximated with a combination of 3-0 and 4-0 monocryl.  Triple antibiotic ointment and xeroform was applied.  Gauze and an ABD were placed over the area and secured with tape so not to be tight or constrictive.  The patient tolerated the procedure well. The patient was allowed to wake and from anesthesia and taken to the recovery room in satisfactory condition. There were no complications.

## 2012-04-04 NOTE — Progress Notes (Signed)
Report form Herminio Heads

## 2012-04-04 NOTE — Consult Note (Signed)
Reason for Consult:left groin exposed Referring Physician: Dr. Brabham  Stephanie Peck is an 51 y.o. female.  HPI: The patient is a 51 yrs old wf here with Vascular surgery for repair of a femoral graft.  There is exposed graft material for which we will plan to cover.  She underwent a vascular repair in January with breakdown of the surrounding tissue.  There is not an infection of the graft so far as we know at this time.    Past Medical History  Diagnosis Date  . Diabetes mellitus   . GERD (gastroesophageal reflux disease)   . Anxiety   . CHF (congestive heart failure)   . COPD (chronic obstructive pulmonary disease)   . Sleep apnea     severe OSA by 09/08/06 sleep study ([facility])  . Depression     Past Surgical History  Procedure Laterality Date  . Cholecystectomy    . Gastric bypass  2006  . Tonsillectomy    . Femoral artery stent  12/05/2011    right superficial   . Endarterectomy femoral  02/22/2012    Procedure: ENDARTERECTOMY FEMORAL;  Surgeon: Vance W Brabham, MD;  Location: [facility] OR;  Service: Vascular;  Laterality: Left;  . Patch angioplasty  02/22/2012    Procedure: PATCH ANGIOPLASTY;  Surgeon: Vance W Brabham, MD;  Location: [facility] OR;  Service: Vascular;  Laterality: Left;  left femoral patch angioplasty  . Application of wound vac  02/22/2012    Procedure: APPLICATION OF WOUND VAC;  Surgeon: Vance W Brabham, MD;  Location: [facility] OR;  Service: Vascular;  Laterality: Left;  application wound vac  . Groin debridement  03/01/2012    Procedure: GROIN DEBRIDEMENT;  Surgeon: Todd F Early, MD;  Location: [facility] OR;  Service: Vascular;  Laterality: Left;    Family History  Problem Relation Age of Onset  . Cancer Mother   . Depression Mother   . Heart disease Father     before age 60  . Diabetes Father   . Hypertension Father   . Hyperlipidemia Father   . Heart attack Father   . Cancer Maternal Grandmother     Social History:  reports that she quit smoking about 5 months ago. Her  smoking use included Cigarettes. She started smoking about 6 months ago. She has a 40 pack-year smoking history. She has never used smokeless tobacco. She reports that she does not drink alcohol or use illicit drugs.  Allergies:  Allergies  Allergen Reactions  . Codeine Nausea Only    Medications: I have reviewed the patient's current medications.  Results for orders placed during the hospital encounter of 03/31/12 (from the past 48 hour(s))  HEPARIN LEVEL (UNFRACTIONATED)     Status: Abnormal   Collection Time    04/02/12  8:36 AM      Result Value Range   Heparin Unfractionated 0.18 (*) 0.30 - 0.70 IU/mL   Comment:            IF HEPARIN RESULTS ARE BELOW     EXPECTED VALUES, AND PATIENT     DOSAGE HAS BEEN CONFIRMED,     SUGGEST FOLLOW UP TESTING     OF ANTITHROMBIN III LEVELS.  GLUCOSE, CAPILLARY     Status: Abnormal   Collection Time    04/02/12 11:24 AM      Result Value Range   Glucose-Capillary 152 (*) 70 - 99 mg/dL   Comment 1 Notify RN    HEPARIN LEVEL (UNFRACTIONATED)     Status:   None   Collection Time    04/02/12  5:51 PM      Result Value Range   Heparin Unfractionated 0.33  0.30 - 0.70 IU/mL   Comment:            IF HEPARIN RESULTS ARE BELOW     EXPECTED VALUES, AND PATIENT     DOSAGE HAS BEEN CONFIRMED,     SUGGEST FOLLOW UP TESTING     OF ANTITHROMBIN III LEVELS.  GLUCOSE, CAPILLARY     Status: Abnormal   Collection Time    04/02/12  9:06 PM      Result Value Range   Glucose-Capillary 170 (*) 70 - 99 mg/dL  HEPARIN LEVEL (UNFRACTIONATED)     Status: Abnormal   Collection Time    04/02/12 11:42 PM      Result Value Range   Heparin Unfractionated 0.29 (*) 0.30 - 0.70 IU/mL   Comment:            IF HEPARIN RESULTS ARE BELOW     EXPECTED VALUES, AND PATIENT     DOSAGE HAS BEEN CONFIRMED,     SUGGEST FOLLOW UP TESTING     OF ANTITHROMBIN III LEVELS.  CBC     Status: Abnormal   Collection Time    04/03/12  5:05 AM      Result Value Range   WBC 9.1   4.0 - 10.5 K/uL   RBC 3.71 (*) 3.87 - 5.11 MIL/uL   Hemoglobin 11.7 (*) 12.0 - 15.0 g/dL   HCT 35.6 (*) 36.0 - 46.0 %   MCV 96.0  78.0 - 100.0 fL   MCH 31.5  26.0 - 34.0 pg   MCHC 32.9  30.0 - 36.0 g/dL   RDW 13.7  11.5 - 15.5 %   Platelets 387  150 - 400 K/uL  BASIC METABOLIC PANEL     Status: Abnormal   Collection Time    04/03/12  5:05 AM      Result Value Range   Sodium 141  135 - 145 mEq/L   Potassium 4.2  3.5 - 5.1 mEq/L   Chloride 107  96 - 112 mEq/L   CO2 23  19 - 32 mEq/L   Glucose, Bld 149 (*) 70 - 99 mg/dL   BUN 15  6 - 23 mg/dL   Creatinine, Ser 1.58 (*) 0.50 - 1.10 mg/dL   Calcium 9.1  8.4 - 10.5 mg/dL   GFR calc non Af Amer 36 (*) >90 mL/min   GFR calc Af Amer 42 (*) >90 mL/min   Comment:            The eGFR has been calculated     using the CKD EPI equation.     This calculation has not been     validated in all clinical     situations.     eGFR's persistently     <90 mL/min signify     possible Chronic Kidney Disease.  GLUCOSE, CAPILLARY     Status: Abnormal   Collection Time    04/03/12  5:52 AM      Result Value Range   Glucose-Capillary 129 (*) 70 - 99 mg/dL  HEPARIN LEVEL (UNFRACTIONATED)     Status: None   Collection Time    04/03/12  7:18 AM      Result Value Range   Heparin Unfractionated 0.30  0.30 - 0.70 IU/mL   Comment:              IF HEPARIN RESULTS ARE BELOW     EXPECTED VALUES, AND PATIENT     DOSAGE HAS BEEN CONFIRMED,     SUGGEST FOLLOW UP TESTING     OF ANTITHROMBIN III LEVELS.  TYPE AND SCREEN     Status: None   Collection Time    04/03/12 10:30 AM      Result Value Range   ABO/RH(D) O POS     Antibody Screen NEG     Sample Expiration 04/06/2012     Unit Number [id number]     Blood Component Type RED CELLS,LR     Unit division 00     Status of Unit ALLOCATED     Transfusion Status OK TO TRANSFUSE     Crossmatch Result Compatible     Unit Number [id number]     Blood Component Type RED CELLS,LR     Unit division 00       Status of Unit ALLOCATED     Transfusion Status OK TO TRANSFUSE     Crossmatch Result Compatible    GLUCOSE, CAPILLARY     Status: Abnormal   Collection Time    04/03/12 11:27 AM      Result Value Range   Glucose-Capillary 171 (*) 70 - 99 mg/dL   Comment 1 Notify RN    GLUCOSE, CAPILLARY     Status: Abnormal   Collection Time    04/03/12  4:33 PM      Result Value Range   Glucose-Capillary 155 (*) 70 - 99 mg/dL   Comment 1 Documented in Chart     Comment 2 Notify RN    HEPARIN LEVEL (UNFRACTIONATED)     Status: None   Collection Time    04/03/12  4:55 PM      Result Value Range   Heparin Unfractionated 0.41  0.30 - 0.70 IU/mL   Comment:            IF HEPARIN RESULTS ARE BELOW     EXPECTED VALUES, AND PATIENT     DOSAGE HAS BEEN CONFIRMED,     SUGGEST FOLLOW UP TESTING     OF ANTITHROMBIN III LEVELS.  GLUCOSE, CAPILLARY     Status: Abnormal   Collection Time    04/03/12  9:52 PM      Result Value Range   Glucose-Capillary 153 (*) 70 - 99 mg/dL   Comment 1 Documented in Chart     Comment 2 Notify RN    GLUCOSE, CAPILLARY     Status: Abnormal   Collection Time    04/04/12  6:30 AM      Result Value Range   Glucose-Capillary 132 (*) 70 - 99 mg/dL  PREPARE RBC (CROSSMATCH)     Status: None   Collection Time    04/04/12 10:15 AM      Result Value Range   Order Confirmation ORDER PROCESSED BY BLOOD BANK      No results found.  ROS Blood pressure 138/67, pulse 90, temperature 97.9 F (36.6 C), temperature source Oral, resp. rate 18, height 5' 7" (1.702 m), weight 126.4 kg (278 lb 10.6 oz), SpO2 97.00%. Physical Exam  Constitutional: She appears well-developed and well-nourished.  HENT:  Head: Normocephalic and atraumatic.  Eyes: Conjunctivae and EOM are normal. Pupils are equal, round, and reactive to light.  Cardiovascular: Normal rate.   Respiratory: Effort normal.  GI: Soft.  Musculoskeletal:       Legs: Neurological: She is alert.  Skin: Skin is warm.     Psychiatric: She has a normal mood and affect. Her behavior is normal. Judgment and thought content normal.    Assessment/Plan: Exposed left femoral graft.  Plan for muscle flap coverage.  Risks and complications were reviewed and included infection, bleeding, pain, scar, risk of anesthesia, muscle loss, flap failure. The consent was signed and patient wishes to proceed.  SANGER,CLAIRE 04/04/2012, 7:56 AM      

## 2012-04-04 NOTE — Progress Notes (Signed)
Report to Silvestre Gunner RN as primary caregiver

## 2012-04-04 NOTE — Interval H&P Note (Signed)
History and Physical Interval Note:  04/04/2012 8:04 AM  Stephanie Peck  has presented today for surgery, with the diagnosis of claudication  The various methods of treatment have been discussed with the patient and family. After consideration of risks, benefits and other options for treatment, the patient has consented to  Procedure(s): THROMBECTOMY FEMORAL ARTERY (Left) AORTOGRAM (Left) MUSCLE FLAP CLOSURE (Left) as a surgical intervention .  The patient's history has been reviewed, patient examined, no change in status, stable for surgery.  I have reviewed the patient's chart and labs.  Questions were answered to the patient's satisfaction.     SANGER,CLAIRE

## 2012-04-05 LAB — CBC
HCT: 30.3 % — ABNORMAL LOW (ref 36.0–46.0)
Hemoglobin: 9.9 g/dL — ABNORMAL LOW (ref 12.0–15.0)
MCHC: 32.7 g/dL (ref 30.0–36.0)
RBC: 3.12 MIL/uL — ABNORMAL LOW (ref 3.87–5.11)

## 2012-04-05 LAB — VANCOMYCIN, TROUGH: Vancomycin Tr: 18.4 ug/mL (ref 10.0–20.0)

## 2012-04-05 LAB — GLUCOSE, CAPILLARY
Glucose-Capillary: 178 mg/dL — ABNORMAL HIGH (ref 70–99)
Glucose-Capillary: 153 mg/dL — ABNORMAL HIGH (ref 70–99)
Glucose-Capillary: 126 mg/dL — ABNORMAL HIGH (ref 70–99)

## 2012-04-05 MED ORDER — OXYCODONE-ACETAMINOPHEN 5-325 MG PO TABS
1.0000 | ORAL_TABLET | ORAL | Status: DC | PRN
  Administered 2012-04-05: 2 via ORAL
  Administered 2012-04-05: 1 via ORAL
  Administered 2012-04-05 – 2012-04-09 (×13): 2 via ORAL
  Filled 2012-04-05 (×15): qty 2

## 2012-04-05 MED ORDER — ALUM & MAG HYDROXIDE-SIMETH 200-200-20 MG/5ML PO SUSP
15.0000 mL | Freq: Three times a day (TID) | ORAL | Status: DC | PRN
  Administered 2012-04-05 – 2012-04-07 (×3): 15 mL via ORAL
  Filled 2012-04-05 (×3): qty 30

## 2012-04-05 MED ORDER — CLOPIDOGREL BISULFATE 75 MG PO TABS
75.0000 mg | ORAL_TABLET | Freq: Every day | ORAL | Status: DC
  Administered 2012-04-06 – 2012-04-09 (×4): 75 mg via ORAL
  Filled 2012-04-05 (×5): qty 1

## 2012-04-05 MED ORDER — INSULIN ASPART 100 UNIT/ML ~~LOC~~ SOLN: 0.0000 [IU] | Freq: Every day | SUBCUTANEOUS | Status: DC

## 2012-04-05 MED ORDER — HEPARIN (PORCINE) IN NACL 100-0.45 UNIT/ML-% IJ SOLN
3100.0000 [IU]/h | INTRAMUSCULAR | Status: DC
  Administered 2012-04-05: 800 [IU]/h via INTRAVENOUS
  Administered 2012-04-06: 1800 [IU]/h via INTRAVENOUS
  Administered 2012-04-07 (×3): 2800 [IU]/h via INTRAVENOUS
  Administered 2012-04-07: 2500 [IU]/h via INTRAVENOUS
  Administered 2012-04-08: 3300 [IU]/h via INTRAVENOUS
  Administered 2012-04-08: 3000 [IU]/h via INTRAVENOUS
  Administered 2012-04-09: 3300 [IU]/h via INTRAVENOUS
  Administered 2012-04-09 (×2): 3100 [IU]/h via INTRAVENOUS
  Filled 2012-04-05 (×12): qty 250

## 2012-04-05 MED ORDER — INSULIN ASPART 100 UNIT/ML ~~LOC~~ SOLN
0.0000 [IU] | Freq: Three times a day (TID) | SUBCUTANEOUS | Status: DC
  Administered 2012-04-05: 3 [IU] via SUBCUTANEOUS
  Administered 2012-04-05: 4 [IU] via SUBCUTANEOUS
  Administered 2012-04-06 – 2012-04-07 (×3): 3 [IU] via SUBCUTANEOUS
  Administered 2012-04-07: 4 [IU] via SUBCUTANEOUS
  Administered 2012-04-08 – 2012-04-09 (×2): 3 [IU] via SUBCUTANEOUS

## 2012-04-05 NOTE — Progress Notes (Addendum)
ANTICOAGULATION CONSULT NOTE - Initial Consult  Pharmacy Consult for Heparin/Vancomycin/zosyn Indication: Lower Extremity Ischemia and Groin Infection   Allergies  Allergen Reactions  . Codeine Nausea Only    Patient Measurements: Height: 5\' 7"  (170.2 cm) Weight: 269 lb 6.4 oz (122.2 kg) IBW/kg (Calculated) : 61.6 Heparin Dosing Weight: 91 KG  Vital Signs: Temp: 98.1 F (36.7 C) (03/08 0806) Temp src: Oral (03/08 0806) BP: 101/49 mmHg (03/08 0806) Pulse Rate: 99 (03/08 0806)  Labs:  Recent Labs  04/03/12 0505 04/03/12 0718 04/03/12 1655 04/04/12 0500 04/05/12 0444  HGB 11.7*  --   --  11.4* 9.9*  HCT 35.6*  --   --  33.8* 30.3*  PLT 387  --   --  347 328  HEPARINUNFRC  --  0.30 0.41 <0.10*  --   CREATININE 1.58*  --   --  1.46*  --     Estimated Creatinine Clearance: 60.4 ml/min (by C-G formula based on Cr of 1.46).   Medical History: Past Medical History  Diagnosis Date  . Diabetes mellitus   . GERD (gastroesophageal reflux disease)   . Anxiety   . CHF (congestive heart failure)   . COPD (chronic obstructive pulmonary disease)   . Sleep apnea     severe OSA by 09/08/06 sleep study ([facility])  . Depression     Assessment: 52 year old female who is s/p left femoral endarterectomy 02/22/12, left groin wound debridement 03/01/12 and femoral artery thrombectomy 3/7 to restart IV heparin, per MD request, will keep infusion rate at 800 units per hour today. If no evidence of bleeding begin to titrate tomorrow with no bolus. Hgb 9.9, plt 328. Patient was previously therapeutic on heparin 2500-2800 units/hr  Patient is also on D#5 vancomycin and zosyn for groin infection. Afebrile, wbc elevated 8.7 > 13.9, Groin abscess culture pending. Scr stable, est. crcl ~ 60 ml/min  Goal of Therapy:  Heparin level 0.3-0.7 units/ml Monitor platelets by anticoagulation protocol: Yes   Plan:  - Heparin infusion 800 units/hr - f/u AM heparin level and plans on anticoagulation -  continue zosyn 3.375 g IV Q 8 hrs - Vancomycin trough at 2030 before today's dose   Bayard Hugger, PharmD, BCPS  Clinical Pharmacist  Pager: [phone]   04/05/2012,9:27 AM

## 2012-04-05 NOTE — Progress Notes (Signed)
ANTIBIOTIC CONSULT NOTE  Pharmacy Consult for Vancomyin Indication: Groin infection  Allergies  Allergen Reactions  . Codeine Nausea Only    Patient Measurements: Height: 5\' 7"  (170.2 cm) Weight: 269 lb 6.4 oz (122.2 kg) IBW/kg (Calculated) : 61.6  Vital Signs: Temp: 98.2 F (36.8 C) (03/08 2054) Temp src: Oral (03/08 2054) BP: 114/53 mmHg (03/08 2054) Pulse Rate: 94 (03/08 2054)  Labs:  Recent Labs  04/03/12 0505 04/04/12 0500 04/05/12 0444  WBC 9.1 8.7 13.9*  HGB 11.7* 11.4* 9.9*  PLT 387 347 328  CREATININE 1.58* 1.46*  --    Estimated Creatinine Clearance: 60.4 ml/min (by C-G formula based on Cr of 1.46).  Recent Labs  04/05/12 2130  VANCOTROUGH 18.4    Assessment: 52 yo female with groin wound for empiric antibiotics  Goal of Therapy:  Vancomycin trough 15-20   Plan:  Continue vancomycin 1750 mg IV q24h  Eddie Candle 04/05/2012,10:46 PM

## 2012-04-05 NOTE — Progress Notes (Signed)
1 Day Post-Op  Subjective: The patient is up on the side of the bed eating breakfast.  Pain is well controlled and she is very pleased with the feeling coming back in her left leg.  Objective: Vital signs in last 24 hours: Temp:  [97 F (36.1 C)-99 F (37.2 C)] 98.1 F (36.7 C) (03/08 0806) Pulse Rate:  [81-99] 99 (03/08 0806) Resp:  [11-26] 13 (03/08 0806) BP: (101-160)/(49-83) 101/49 mmHg (03/08 0806) SpO2:  [92 %-100 %] 99 % (03/08 0806) Arterial Line BP: (80-167)/(54-98) 113/74 mmHg (03/08 0806) Weight:  [122.2 kg (269 lb 6.4 oz)] 122.2 kg (269 lb 6.4 oz) (03/07 1930) Last BM Date: 04/03/12  Intake/Output from previous day: 03/07 0701 - 03/08 0700 In: 4780 [P.O.:480; I.V.:3725; IV Piggyback:575] Out: 2525 [Urine:2000; Drains:75; Blood:450] Intake/Output this shift: Total I/O In: -  Out: 30 [Drains:30]  General appearance: alert, cooperative, no distress and very pleasant Incision/Wound: Flap is warm and dry with excellent blood flow and capillary refill.  Lab Results:   Recent Labs  04/04/12 0500 04/05/12 0444  WBC 8.7 13.9*  HGB 11.4* 9.9*  HCT 33.8* 30.3*  PLT 347 328   BMET  Recent Labs  04/03/12 0505 04/04/12 0500  NA 141 141  K 4.2 4.3  CL 107 108  CO2 23 21  GLUCOSE 149* 150*  BUN 15 11  CREATININE 1.58* 1.46*  CALCIUM 9.1 9.1   PT/INR No results found for this basename: LABPROT, INR,  in the last 72 hours ABG No results found for this basename: PHART, PCO2, PO2, HCO3,  in the last 72 hours  Studies/Results: No results found.  Anti-infectives: Anti-infectives   Start     Dose/Rate Route Frequency Ordered Stop   04/04/12 1630  piperacillin-tazobactam (ZOSYN) IVPB 3.375 g     3.375 g 100 mL/hr over 30 Minutes Intravenous To Post Anesthesia Care Unit 04/04/12 1617 04/04/12 1744   04/02/12 2100  vancomycin (VANCOCIN) 1,750 mg in sodium chloride 0.9 % 500 mL IVPB     1,750 mg 250 mL/hr over 120 Minutes Intravenous Every 24 hours 04/01/12  2044     04/01/12 2100  piperacillin-tazobactam (ZOSYN) IVPB 3.375 g     3.375 g 12.5 mL/hr over 240 Minutes Intravenous 3 times per day 04/01/12 2044     04/01/12 2045  vancomycin (VANCOCIN) 2,500 mg in sodium chloride 0.9 % 500 mL IVPB     2,500 mg 250 mL/hr over 120 Minutes Intravenous  Once 04/01/12 2044 04/02/12 0018      Assessment/Plan: s/p Procedure(s): THROMBECTOMY FEMORAL ARTERY (Left) AORTOGRAM (Left) MUSCLE FLAP CLOSURE (Left) PATCH ANGIOPLASTY (Left) If discharge in the next few days I would like to see the patient Tuesday in my office at 9 am.  Otherwise can see in my office next Friday. Will order for a prealbumin to follow.  LOS: 5 days    [facility] 04/05/2012

## 2012-04-05 NOTE — Progress Notes (Signed)
[facility]  Subjective  - Some left groin soreness, left foot feels better   Objective 101/49 99 98.1 F (36.7 C) (Oral) 13 99%  Intake/Output Summary (Last 24 hours) at 04/05/12 4098 Last data filed at 04/05/12 0800  Gross per 24 hour  Intake   4780 ml  Output   2555 ml  Net   2225 ml   Left groin no obvious hematoma JP bloody to serosanguinous Left foot pink and warm with brisk cap refill  Assessment/Planning: Patent femoral endarterectomy OOB today ambulate JP per Dr Kelly Splinter Plastic surgery Restart Plavix today Start low dose heparin 800 units/hr will start to titrate tomorrow if no bleeding D/c foley tomorrow.  She is high risk of groin infection and not very mobile yet  FIELDS,CHARLES E 04/05/2012 9:29 AM --  Laboratory Lab Results:  Recent Labs  04/04/12 0500 04/05/12 0444  WBC 8.7 13.9*  HGB 11.4* 9.9*  HCT 33.8* 30.3*  PLT 347 328   BMET  Recent Labs  04/03/12 0505 04/04/12 0500  NA 141 141  K 4.2 4.3  CL 107 108  CO2 23 21  GLUCOSE 149* 150*  BUN 15 11  CREATININE 1.58* 1.46*  CALCIUM 9.1 9.1    COAG Lab Results  Component Value Date   INR 1.03 03/31/2012   INR 1.08 03/01/2012   INR 1.04 02/29/2012   No results found for this basename: PTT

## 2012-04-06 LAB — HEPARIN LEVEL (UNFRACTIONATED)
Heparin Unfractionated: <0.1 IU/mL — ABNORMAL LOW (ref 0.30–0.70)
Heparin Unfractionated: <0.1 IU/mL — ABNORMAL LOW (ref 0.30–0.70)

## 2012-04-06 LAB — CBC
HCT: 27.8 % — ABNORMAL LOW (ref 36.0–46.0)
MCHC: 32.7 g/dL (ref 30.0–36.0)
Platelets: 305 10*3/uL (ref 150–400)
RDW: 13.9 % (ref 11.5–15.5)
WBC: 14.5 10*3/uL — ABNORMAL HIGH (ref 4.0–10.5)

## 2012-04-06 LAB — GLUCOSE, CAPILLARY
Glucose-Capillary: 134 mg/dL — ABNORMAL HIGH (ref 70–99)
Glucose-Capillary: 122 mg/dL — ABNORMAL HIGH (ref 70–99)

## 2012-04-06 MED ORDER — PANTOPRAZOLE SODIUM 40 MG PO TBEC
40.0000 mg | DELAYED_RELEASE_TABLET | Freq: Two times a day (BID) | ORAL | Status: DC
  Administered 2012-04-06 – 2012-04-09 (×7): 40 mg via ORAL
  Filled 2012-04-06 (×5): qty 1

## 2012-04-06 MED ORDER — PHENOL 1.4 % MT LIQD
1.0000 | OROMUCOSAL | Status: DC | PRN
  Administered 2012-04-06: 1 via OROMUCOSAL
  Filled 2012-04-06: qty 177

## 2012-04-06 NOTE — Progress Notes (Signed)
2 Days Post-Op  Subjective: Pt reports pain is under better control and she has been up ambulating some with the walker.  She reports the numbness in her left leg has resolved.  The left groin flap remains pink, viable and without signs of infection. There is moderate serosang drainage from the JP drains.   Objective: Vital signs in last 24 hours: Temp:  [97.4 F (36.3 C)-98.2 F (36.8 C)] 97.4 F (36.3 C) (03/09 1648) Pulse Rate:  [79-97] 86 (03/09 1648) Resp:  [16-26] 19 (03/09 1648) BP: (102-126)/(52-61) 126/59 mmHg (03/09 1648) SpO2:  [95 %-100 %] 96 % (03/09 1510) Weight:  [124.8 kg (275 lb 2.2 oz)] 124.8 kg (275 lb 2.2 oz) (03/09 0300) Last BM Date: 04/03/12  Intake/Output from previous day: 03/08 0701 - 03/09 0700 In: 927.5 [P.O.:480; I.V.:360; IV Piggyback:87.5] Out: 4250 [Urine:4100; Drains:150] Intake/Output this shift: Total I/O In: 166.5 [I.V.:104; IV Piggyback:62.5] Out: 80 [Drains:80]  General appearance: alert, cooperative and no distress Resp: clear to auscultation bilaterally Cardio: regular rate and rhythm m The left groin flap has excellent color and cap refill  Lab Results:   Recent Labs  04/05/12 0444 04/06/12 0510  WBC 13.9* 14.5*  HGB 9.9* 9.1*  HCT 30.3* 27.8*  PLT 328 305   BMET  Recent Labs  04/04/12 0500  NA 141  K 4.3  CL 108  CO2 21  GLUCOSE 150*  BUN 11  CREATININE 1.46*  CALCIUM 9.1   PT/INR No results found for this basename: LABPROT, INR,  in the last 72 hours ABG No results found for this basename: PHART, PCO2, PO2, HCO3,  in the last 72 hours  Studies/Results: No results found.  Anti-infectives: Anti-infectives   Start     Dose/Rate Route Frequency Ordered Stop   04/04/12 1630  piperacillin-tazobactam (ZOSYN) IVPB 3.375 g     3.375 g 100 mL/hr over 30 Minutes Intravenous To Post Anesthesia Care Unit 04/04/12 1617 04/04/12 1744   04/02/12 2100  vancomycin (VANCOCIN) 1,750 mg in sodium chloride 0.9 % 500 mL IVPB      1,750 mg 250 mL/hr over 120 Minutes Intravenous Every 24 hours 04/01/12 2044     04/01/12 2100  piperacillin-tazobactam (ZOSYN) IVPB 3.375 g     3.375 g 12.5 mL/hr over 240 Minutes Intravenous 3 times per day 04/01/12 2044     04/01/12 2045  vancomycin (VANCOCIN) 2,500 mg in sodium chloride 0.9 % 500 mL IVPB     2,500 mg 250 mL/hr over 120 Minutes Intravenous  Once 04/01/12 2044 04/02/12 0018      Assessment/Plan: s/p Procedure(s): THROMBECTOMY FEMORAL ARTERY (Left) AORTOGRAM (Left) MUSCLE FLAP CLOSURE (Left) PATCH ANGIOPLASTY (Left) Continue JP drains.  Follow up in office next week as noted if discharged over next couple of days.   LOS: 6 days    Franki Monte 04/06/2012 Plastic Surgery [phone]

## 2012-04-06 NOTE — Progress Notes (Signed)
ANTICOAGULATION CONSULT NOTE   Pharmacy Consult for Heparin Indication: Lower Extremity Ischemia   Allergies  Allergen Reactions  . Codeine Nausea Only   Labs:  Recent Labs  04/04/12 0500 04/05/12 0444 04/06/12 0510 04/06/12 1942  HGB 11.4* 9.9* 9.1*  --   HCT 33.8* 30.3* 27.8*  --   PLT 347 328 305  --   HEPARINUNFRC <0.10*  --  <0.10* <0.10*  CREATININE 1.46*  --   --   --     Estimated Creatinine Clearance: 61.1 ml/min (by C-G formula based on Cr of 1.46).   Assessment: 52 year old female who is s/p left femoral endarterectomy 02/22/12, left groin wound debridement 03/01/12 and femoral artery thrombectomy 3/7, started low IV heparin at low rate per MD request, and to titrate dose today with no bolus.   Heparin level < 0.10  Goal of Therapy:  Heparin level 0.3-0.7 units/ml Monitor platelets by anticoagulation protocol: Yes   Plan:  - Increase Heparin infusion to 2500 units/hr (previously therapeutic at this rate - Follow up AM heparin level, CBC  Thank you.  Okey Regal, PharmD [phone]  04/06/2012,8:35 PM

## 2012-04-06 NOTE — Op Note (Signed)
[facility] [place]  Patient name: Stephanie Peck MRN: [id number] DOB: 1960-09-27 Sex: female  03/31/2012 - 04/04/2012 Pre-operative Diagnosis: left femoral artery occlusion Post-operative diagnosis:  Same Surgeon:  Jorge Ny Assistants:  Abril Goo, P.A. Procedure:   #1  Redo left femoral artery exposure   #2 ilio-femoral thrombectomy   #3  Left femoral patch angioplasty   #4  Abdominal aortogram   #5  Angioplasty left external iliac artery Anesthesia:  general Blood Loss:  See anesthesia record Specimens:  None Intra-op gram stain: negative for organisms  Findings:  Well organized thrombus within the common femoral artery. The patch was relatively well incorporated. No gross contamination was identified. There was no gross purulence.  Indications:  The patient has previously undergone left femoral endarterectomy with patch angioplasty. She recently developed numbness in her foot and was found to have an occlusion of her previous repair. She has been dealing with an open groin wound. This is not on surprising giving her medical history. Because she was having numbness in her leg I felt that despite her open left groin that we needed to attempt revascularization. I discussed extensively the risks and benefits and particularly the risk of infection as well as the risk of wound complications. I have asked Dr. Kelly Splinter to do a muscle flap to help protect the repair.  Procedure:  The patient was identified in the holding area and taken to [facility] OR ROOM 16  The patient was then placed supine on the table. general anesthesia was administered.  The patient was prepped and draped in the usual sterile fashion.  A time out was called and antibiotics were administered.  The patient's previous left groin incision was opened. There was beefy red granulation tissue. No direct communication to the patch was identified. I used a combination of cautery and scissor dissection to  expose the femoral artery. There was extensive scar tissue around this area. I did identify the femoral nerve which was protected. After a tedious dissection I was able to expose the common femoral artery, superficial femoral artery and the profunda femoral artery. There was a large medial profunda branch was also isolated. Once adequate exposure was obtained, the patient was fully heparinized. I then removed the previously placed patch. There was a dense inflammatory reaction as well as occlusion with subacute thrombus present. This was a gelatinous type material. It was easily removed from the intima. I then passed #3 and #4 Fogarty catheters down the superficial femoral artery. A small amount of clot was evacuated, and there was good backbleeding. Similarly a #3 catheter was passed down the profunda femoral artery. No thrombus was evacuated and there was good backbleeding. I then used a #4 and #5 Fogarty catheter to perform thrombectomy of the iliac artery. After multiple passes I was able to get excellent inflow. I then placed a sheath within the distal left external iliac artery and performed a retrograde arteriogram. This showed a widely patent common and external iliac artery on the left. The distal external iliac artery was not well visualized because of the location of the sheath. At this point I decided to proceed with patch angioplasty. At this time, I selected a CorMatrix patch to perform the patch angioplasty. This was sewn in place with a 5-0 Prolene. Prior to completion I reinserted the sheath so as to get a better look at the distal external iliac artery. This area appeared to be patent however there was some luminal irregularity which  potentially could be related to the previous endarterectomy. I elected to perform balloon angioplasty using a 7 x 40 Mustang balloon of this area. The balloon was taken to profile and held up for 1 minute. A completion arteriogram showed that this area looked less  irregular. I then ensured proper backbleeding from all vessels. The patch angioplasty was then completed. The patient now had a return of a posterior tibial and anterior tibial artery signal at the ankle. I did not reverse the heparin. At this point, Dr. Kelly Splinter came in to perform her portion of the procedure. Please see her operative note for full details. I did assist her with this procedure.   Disposition:  To PACU in stable condition.   Juleen China, M.D. [facility] [place] Office: [phone] Pager:  [phone]

## 2012-04-06 NOTE — Progress Notes (Signed)
[facility]  Subjective  -no complaints   Objective 119/56 83 98 F (36.7 C) (Oral) 20 95%  Intake/Output Summary (Last 24 hours) at 04/06/12 2041 Last data filed at 04/06/12 1800  Gross per 24 hour  Intake    392 ml  Output   3865 ml  Net  -3473 ml   Left groin muscle flap with some gray skin edges but overall viable Left foot pink and warm  Assessment/Planning: Continue current care Transfer 2000  Drains per Dr Linwood Dibbles E 04/06/2012 8:41 PM --  Laboratory Lab Results:  Recent Labs  04/05/12 0444 04/06/12 0510  WBC 13.9* 14.5*  HGB 9.9* 9.1*  HCT 30.3* 27.8*  PLT 328 305   BMET  Recent Labs  04/04/12 0500  NA 141  K 4.3  CL 108  CO2 21  GLUCOSE 150*  BUN 11  CREATININE 1.46*  CALCIUM 9.1    COAG Lab Results  Component Value Date   INR 1.03 03/31/2012   INR 1.08 03/01/2012   INR 1.04 02/29/2012   No results found for this basename: PTT

## 2012-04-06 NOTE — Progress Notes (Signed)
ANTICOAGULATION CONSULT NOTE - Initial Consult  Pharmacy Consult for Heparin Indication: Lower Extremity Ischemia   Allergies  Allergen Reactions  . Codeine Nausea Only    Patient Measurements: Height: 5\' 7"  (170.2 cm) Weight: 275 lb 2.2 oz (124.8 kg) IBW/kg (Calculated) : 61.6 Heparin Dosing Weight: 91 KG  Vital Signs: Temp: 97.7 F (36.5 C) (03/09 1210) Temp src: Oral (03/09 1210) BP: 110/61 mmHg (03/09 1210) Pulse Rate: 82 (03/09 1210)  Labs:  Recent Labs  04/03/12 1655  04/04/12 0500 04/05/12 0444 04/06/12 0510  HGB  --   < > 11.4* 9.9* 9.1*  HCT  --   --  33.8* 30.3* 27.8*  PLT  --   --  347 328 305  HEPARINUNFRC 0.41  --  <0.10*  --  <0.10*  CREATININE  --   --  1.46*  --   --   < > = values in this interval not displayed.  Estimated Creatinine Clearance: 61.1 ml/min (by C-G formula based on Cr of 1.46).   Medical History: Past Medical History  Diagnosis Date  . Diabetes mellitus   . GERD (gastroesophageal reflux disease)   . Anxiety   . CHF (congestive heart failure)   . COPD (chronic obstructive pulmonary disease)   . Sleep apnea     severe OSA by 09/08/06 sleep study ([facility])  . Depression     Assessment: 52 year old female who is s/p left femoral endarterectomy 02/22/12, left groin wound debridement 03/01/12 and femoral artery thrombectomy 3/7, started low IV heparin at low rate per MD request, and to titrate dose today with no bolus. Spoke with RN, no bleeding noted. Hgb 9.1, plt 305. Patient was previously therapeutic on heparin 2500-2800 units/hr  Goal of Therapy:  Heparin level 0.3-0.7 units/ml Monitor platelets by anticoagulation protocol: Yes   Plan:  - Increase Heparin infusion to 1800 units/hr - f/u AM 2000 heparin level - monitor daily cbc and heparin level  Bayard Hugger, PharmD, BCPS  Clinical Pharmacist  Pager: [phone]   04/06/2012,2:01 PM

## 2012-04-07 ENCOUNTER — Ambulatory Visit: Payer: Medicare Other | Admitting: Surgery

## 2012-04-07 ENCOUNTER — Encounter (HOSPITAL_COMMUNITY): Payer: Self-pay | Admitting: Surgery

## 2012-04-07 LAB — POCT I-STAT 7, (LYTES, BLD GAS, ICA,H+H)
Calcium, Ion: 1.16 mmol/L (ref 1.12–1.23)
Hemoglobin: 10.5 g/dL — ABNORMAL LOW (ref 12.0–15.0)
O2 Saturation: 99 %
Patient temperature: 37.1
TCO2: 22 mmol/L (ref 0–100)
pCO2 arterial: 47.7 mmHg — ABNORMAL HIGH (ref 35.0–45.0)
pO2, Arterial: 192 mmHg — ABNORMAL HIGH (ref 80.0–100.0)

## 2012-04-07 LAB — GLUCOSE, CAPILLARY
Glucose-Capillary: 100 mg/dL — ABNORMAL HIGH (ref 70–99)
Glucose-Capillary: 121 mg/dL — ABNORMAL HIGH (ref 70–99)
Glucose-Capillary: 178 mg/dL — ABNORMAL HIGH (ref 70–99)

## 2012-04-07 LAB — CBC
HCT: 27.4 % — ABNORMAL LOW (ref 36.0–46.0)
Hemoglobin: 9 g/dL — ABNORMAL LOW (ref 12.0–15.0)
MCH: 31.9 pg (ref 26.0–34.0)
MCHC: 32.8 g/dL (ref 30.0–36.0)

## 2012-04-07 LAB — CULTURE, ROUTINE-ABSCESS: Culture: NO GROWTH

## 2012-04-07 LAB — TYPE AND SCREEN

## 2012-04-07 NOTE — Progress Notes (Signed)
ANTICOAGULATION CONSULT NOTE - Follow Up Consult  Pharmacy Consult for Heparin Indication: LE Ischemia  Allergies  Allergen Reactions  . Codeine Nausea Only    Patient Measurements: Height: 5\' 7"  (170.2 cm) Weight: 275 lb 9.2 oz (125 kg) IBW/kg (Calculated) : 61.6 Heparin Dosing Weight: 91 kg  Vital Signs: Temp: 97.2 F (36.2 C) (03/10 1405) Temp src: Oral (03/10 1405) BP: 107/53 mmHg (03/10 1405) Pulse Rate: 85 (03/10 1405)  Labs:  Recent Labs  04/05/12 0444  04/06/12 0510 04/06/12 1942 04/07/12 0900 04/07/12 1658  HGB 9.9*  --  9.1*  --  9.0*  --   HCT 30.3*  --  27.8*  --  27.4*  --   PLT 328  --  305  --  349  --   HEPARINUNFRC  --   < > <0.10* <0.10* 0.19* 0.36  < > = values in this interval not displayed.  Estimated Creatinine Clearance: 61.2 ml/min (by C-G formula based on Cr of 1.46).   Assessment: 52 year old female Heparin s/p procedures for lower extremity ischemia, on IV heparin, heparin level (0.36) is therapeutic on 2800 units/hr.   Goal of Therapy:  Heparin level 0.3-0.7 units/ml Monitor platelets by anticoagulation protocol: Yes   Plan:  - Continue at current rate - f/u AM heparin level and CBC  Bayard Hugger, PharmD, BCPS  Clinical Pharmacist  Pager: [phone]   04/07/2012,6:12 PM

## 2012-04-07 NOTE — Progress Notes (Signed)
Agree with the above exam and plan.

## 2012-04-07 NOTE — Progress Notes (Signed)
3 Days Post-Op  Subjective: Groin flap with some duskiness medially and laterally this am. One minor area of superficial slough medially. She seems slightly more edematous today. Pulses intact distally.  JP's with moderate serosang drainage.  Slight increase leukocytosis this am. No fever. O2 sat 91% on room air.  Objective: Vital signs in last 24 hours: Temp:  [97.4 F (36.3 C)-98 F (36.7 C)] 97.5 F (36.4 C) (03/10 0542) Pulse Rate:  [81-86] 81 (03/10 0542) Resp:  [18-22] 20 (03/10 0542) BP: (102-126)/(33-61) 107/33 mmHg (03/10 0542) SpO2:  [91 %-97 %] 91 % (03/10 0542) Weight:  [125 kg (275 lb 9.2 oz)] 125 kg (275 lb 9.2 oz) (03/10 0542) Last BM Date: 04/07/12  Intake/Output from previous day: 03/09 0701 - 03/10 0700 In: 166.5 [I.V.:104; IV Piggyback:62.5] Out: 605 [Urine:425; Drains:180] Intake/Output this shift: Total I/O In: -  Out: 30 [Drains:30]  General appearance: alert, cooperative, no distress and morbidly obese Resp: clear to auscultation bilaterally Cardio: regular rate and rhythm Left groin flap-  some duskiness medially and laterally this am. One minor area of superficial slough medially. She seems slightly more edematous today. Pulses intact distally.    Lab Results:   Recent Labs  04/05/12 0444 04/06/12 0510  WBC 13.9* 14.5*  HGB 9.9* 9.1*  HCT 30.3* 27.8*  PLT 328 305   BMET  Recent Labs  04/04/12 1252  NA 143  K 4.5   PT/INR No results found for this basename: LABPROT, INR,  in the last 72 hours ABG  Recent Labs  04/04/12 1252  PHART 7.250*  HCO3 20.9    Studies/Results: No results found.  Anti-infectives: Anti-infectives   Start     Dose/Rate Route Frequency Ordered Stop   04/04/12 1630  piperacillin-tazobactam (ZOSYN) IVPB 3.375 g     3.375 g 100 mL/hr over 30 Minutes Intravenous To Post Anesthesia Care Unit 04/04/12 1617 04/04/12 1744   04/02/12 2100  vancomycin (VANCOCIN) 1,750 mg in sodium chloride 0.9 % 500 mL IVPB      1,750 mg 250 mL/hr over 120 Minutes Intravenous Every 24 hours 04/01/12 2044     04/01/12 2100  piperacillin-tazobactam (ZOSYN) IVPB 3.375 g     3.375 g 12.5 mL/hr over 240 Minutes Intravenous 3 times per day 04/01/12 2044     04/01/12 2045  vancomycin (VANCOCIN) 2,500 mg in sodium chloride 0.9 % 500 mL IVPB     2,500 mg 250 mL/hr over 120 Minutes Intravenous  Once 04/01/12 2044 04/02/12 0018      Assessment/Plan: s/p Procedure(s): THROMBECTOMY FEMORAL ARTERY (Left) AORTOGRAM (Left) MUSCLE FLAP CLOSURE (Left) PATCH ANGIOPLASTY (Left) Flap seems better when pt supine and pannus not putting pressure on this area. Recommend no sitting except for meals. May continue ambulation with walker. Will continue to follow closely while hospitalized.   LOS: 7 days    Franki Monte 04/07/2012 Plastic Surgery [phone]

## 2012-04-07 NOTE — Progress Notes (Addendum)
[facility]  Date of Surgery: Jan 2014  Left common femoral, superficial femoral, profundofemoral endarterectomy with patch angioplasty (bovine)  [name], DO performed  Left rectus femoris muscle flap to groin      Objective 107/33 81 97.5 F (36.4 C) (Oral) 20 91%  Intake/Output Summary (Last 24 hours) at 04/07/12 0746 Last data filed at 04/07/12 0300  Gross per 24 hour  Intake  166.5 ml  Output    605 ml  Net -438.5 ml    Left groin flap dressing in place and 2 drains Palpable Dp left foot and skin is warm to touch   Assessment/Planning: POD #3 Left groin muscle flap Jan 2014  Left common femoral, superficial femoral, profundofemoral endarterectomy with patch angioplasty (bovine)   COLLINS, EMMA [facility] 04/07/2012 7:46 AM --  Laboratory Lab Results:  Recent Labs  04/05/12 0444 04/06/12 0510  WBC 13.9* 14.5*  HGB 9.9* 9.1*  HCT 30.3* 27.8*  PLT 328 305   BMET No results found for this basename: NA, K, CL, CO2, GLUCOSE, BUN, CREATININE, CALCIUM,  in the last 72 hours  COAG Lab Results  Component Value Date   INR 1.03 03/31/2012   INR 1.08 03/01/2012   INR 1.04 02/29/2012   No results found for this basename: PTT     I agree with the above. The patient has been seen and examined There have been some skin changes to the superficial portion of the skin flap. There appears to be early blistering. No erythema is identified. There is slight separation of the skin edges at the lateral side of the flap.  Overall, the patient continues to do well. She has had resolution of the numbness in her left foot. She has good peripheral circulation. My plan currently he is to keep her in the hospital today for another round of IV antibiotics as well as so that we can reevaluate the flap in the morning. She potentially could be discharged tomorrow, depending on what her flap looks like.  Durene Cal

## 2012-04-07 NOTE — Progress Notes (Signed)
Agree with the above plan and exam.

## 2012-04-07 NOTE — Progress Notes (Signed)
ANTICOAGULATION CONSULT NOTE - Initial Consult  Pharmacy Consult for Heparin Indication: Lower Extremity Ischemia   Allergies  Allergen Reactions  . Codeine Nausea Only    Patient Measurements: Height: 5\' 7"  (170.2 cm) Weight: 275 lb 9.2 oz (125 kg) IBW/kg (Calculated) : 61.6 Heparin Dosing Weight: 91kg   Vital Signs: Temp: 97.5 F (36.4 C) (03/10 0542) Temp src: Oral (03/10 0542) BP: 107/33 mmHg (03/10 0542) Pulse Rate: 81 (03/10 0542)  Labs:  Recent Labs  04/05/12 0444 04/06/12 0510 04/06/12 1942 04/07/12 0900  HGB 9.9* 9.1*  --  9.0*  HCT 30.3* 27.8*  --  27.4*  PLT 328 305  --  349  HEPARINUNFRC  --  <0.10* <0.10* 0.19*    Estimated Creatinine Clearance: 61.2 ml/min (by C-G formula based on Cr of 1.46).   Medical History: Past Medical History  Diagnosis Date  . Diabetes mellitus   . GERD (gastroesophageal reflux disease)   . Anxiety   . CHF (congestive heart failure)   . COPD (chronic obstructive pulmonary disease)   . Sleep apnea     severe OSA by 09/08/06 sleep study ([facility])  . Depression     Assessment: 52 year old female who is s/p left femoral endarterectomy 02/22/12, left groin wound debridement 03/01/12 and femoral artery thrombectomy 3/7, started low IV heparin at low rate per MD request, and to titrate dose with no bolus (on 04/07/11).   H/H 9.0/27.4 (stable) and plts 349 (staable). No signs of bleeding at this time. Patient has previously been therapeutic on heparin 2500-2800 uints/hr.  Heparin level 0.19 this am < goal.    Goal of Therapy:  Heparin level 0.3-0.7 units/ml Monitor platelets by anticoagulation protocol: Yes   Plan:  - Increase Heparin infusion to 2800 units/hr  - Recheck heparin level 6 hrs after rate change. - Monitor CBC and heparin level daily   Micheline Chapman PharmD Candidate 04/07/2012,10:37 AM

## 2012-04-08 LAB — CBC
Platelets: 381 10*3/uL (ref 150–400)
RDW: 14.1 % (ref 11.5–15.5)
WBC: 9.8 10*3/uL (ref 4.0–10.5)

## 2012-04-08 LAB — GLUCOSE, CAPILLARY: Glucose-Capillary: 102 mg/dL — ABNORMAL HIGH (ref 70–99)

## 2012-04-08 MED ORDER — HEPARIN BOLUS VIA INFUSION
2500.0000 [IU] | Freq: Once | INTRAVENOUS | Status: AC
  Administered 2012-04-08: 2500 [IU] via INTRAVENOUS
  Filled 2012-04-08: qty 2500

## 2012-04-08 NOTE — Progress Notes (Signed)
ANTICOAGULATION CONSULT NOTE  Pharmacy Consult for Heparin Indication: LE Ischemia  Allergies  Allergen Reactions  . Codeine Nausea Only    Patient Measurements: Height: 5\' 7"  (170.2 cm) Weight: 275 lb 9.2 oz (125 kg) IBW/kg (Calculated) : 61.6 Heparin Dosing Weight: 91 kg  Vital Signs: BP: 113/84 mmHg (03/11 0428) Pulse Rate: 71 (03/11 0428)  Labs:  Recent Labs  04/06/12 0510  04/07/12 0900 04/07/12 1658 04/08/12 0445 04/08/12 1321  HGB 9.1*  --  9.0*  --  8.6*  --   HCT 27.8*  --  27.4*  --  25.6*  --   PLT 305  --  349  --  381  --   HEPARINUNFRC <0.10*  < > 0.19* 0.36 0.25* 0.19*  < > = values in this interval not displayed.  Estimated Creatinine Clearance: 61.2 ml/min (by C-G formula based on Cr of 1.46).   Assessment: 52 year old female who is s/p left femoral endarterectomy 02/22/12, left groin wound debridement 03/01/12 and femoral artery thrombectomy 3/7. H/H 8.6/25.6 (decr), plt 381. Patient was previously therapeutic on heparin 2500-2800 units/hr. HL 0.25 this am. Increased heparin to 3000 units/hr. Heparin level continues to decline to 0.19 again.  Goal of Therapy:  Heparin level 0.3-0.7 units/ml Monitor platelets by anticoagulation protocol: Yes   Plan:  Heparin 2500 unit IV bolus. Increase infusion to 3300 units/hr Recheck heparin level 8 hrs after dose increase. What are the long-term anticoagulation plans?    Crystal S. Merilynn Finland, PharmD, BCPS Clinical Staff Pharmacist Pager [phone]  .04/08/2012,2:18 PM

## 2012-04-08 NOTE — Progress Notes (Signed)
[facility] Progress Note  04/08/2012 7:28 AM POD 2  Subjective:  No complaints this am  Afebrile x 24 hrs VSS 99% CPAP  Filed Vitals:   04/08/12 0428  BP: 113/84  Pulse: 71  Temp:   Resp: 18    Physical Exam: Incisions:  There is two areas of separation medially and one laterally.  There is some drainage and sloughing of the skin.  Donor skin site healing nicely. Extremities:  + palpable left DP  CBC    Component Value Date/Time   WBC 9.8 04/08/2012 0445   RBC 2.65* 04/08/2012 0445   HGB 8.6* 04/08/2012 0445   HCT 25.6* 04/08/2012 0445   PLT 381 04/08/2012 0445   MCV 96.6 04/08/2012 0445   MCH 32.5 04/08/2012 0445   MCHC 33.6 04/08/2012 0445   RDW 14.1 04/08/2012 0445   LYMPHSABS 3.0 02/05/2007 0319   MONOABS 1.8* 02/05/2007 0319   EOSABS 0.0 02/05/2007 0319   BASOSABS 0.0 02/05/2007 0319    BMET    Component Value Date/Time   NA 143 04/04/2012 1252   K 4.5 04/04/2012 1252   CL 108 04/04/2012 0500   CO2 21 04/04/2012 0500   GLUCOSE 150* 04/04/2012 0500   BUN 11 04/04/2012 0500   CREATININE 1.46* 04/04/2012 0500   CALCIUM 9.1 04/04/2012 0500   GFRNONAA 40* 04/04/2012 0500   GFRAA 46* 04/04/2012 0500    INR    Component Value Date/Time   INR 1.03 03/31/2012 2056     Intake/Output Summary (Last 24 hours) at 04/08/12 0728 Last data filed at 04/07/12 1800  Gross per 24 hour  Intake 1218.27 ml  Output     90 ml  Net 1128.27 ml   JP #1:  60 cc JP #2:  30 cc  Assessment/Plan:  52 y.o. female is s/p:   #1 Redo left femoral artery exposure  #2 ilio-femoral thrombectomy  #3 Left femoral patch angioplasty  #4 Abdominal aortogram  #5 Angioplasty left external iliac artery  and Left rectus femoris muscle flap to groin  POD 2  -WBC improving and pt is afebrile -JP drains per Plastics -Creatinine continues to improve -Hgb has drifted down-pt asymptomatic -Per plastics, leave in hospital at least one more day for more IV ABx as there is some separation of the  graft medially/laterally -pt may shower per plastics   [name], PA-C [facility] [phone] 04/08/2012 7:28 AM

## 2012-04-08 NOTE — Progress Notes (Signed)
ANTICOAGULATION CONSULT NOTE  Pharmacy Consult for Heparin Indication: LE Ischemia  Allergies  Allergen Reactions  . Codeine Nausea Only    Patient Measurements: Height: 5\' 7"  (170.2 cm) Weight: 275 lb 9.2 oz (125 kg) IBW/kg (Calculated) : 61.6 Heparin Dosing Weight: 91 kg  Vital Signs: Temp: 97.2 F (36.2 C) (03/11 1340) Temp src: Oral (03/11 1340) BP: 121/71 mmHg (03/11 1340) Pulse Rate: 77 (03/11 1340)  Labs:  Recent Labs  04/06/12 0510  04/07/12 0900  04/08/12 0445 04/08/12 1321 04/08/12 2234  HGB 9.1*  --  9.0*  --  8.6*  --   --   HCT 27.8*  --  27.4*  --  25.6*  --   --   PLT 305  --  349  --  381  --   --   HEPARINUNFRC <0.10*  < > 0.19*  < > 0.25* 0.19* 0.63  < > = values in this interval not displayed.  Estimated Creatinine Clearance: 61.2 ml/min (by C-G formula based on Cr of 1.46).   Assessment: 52 year old female s/p LE thrombectomy, for heparin   Goal of Therapy:  Heparin level 0.3-0.7 units/ml Monitor platelets by anticoagulation protocol: Yes   Plan Continue Heparin at current rate   Geannie Risen, PharmD, BCPS  .04/08/2012,11:36 PM

## 2012-04-08 NOTE — Progress Notes (Signed)
4 Days Post-Op  Subjective: Pt reports pain under good control. Leukocytosis improving and no fever.  Continued superficial slough of skin over flap noted and slight increase in serous drainage from medial and lateral skin edges today. JP drainage mostly serous  Objective: Vital signs in last 24 hours: Temp:  [97.2 F (36.2 C)-97.9 F (36.6 C)] 97.9 F (36.6 C) (03/10 2016) Pulse Rate:  [71-86] 71 (03/11 0428) Resp:  [16-19] 18 (03/11 0428) BP: (107-129)/(53-84) 113/84 mmHg (03/11 0428) SpO2:  [97 %-99 %] 99 % (03/11 0428) Last BM Date: 04/07/12  Intake/Output from previous day: 03/10 0701 - 03/11 0700 In: 1218.3 [P.O.:720; I.V.:448.3; IV Piggyback:50] Out: 90 [Drains:90] Intake/Output this shift:    General appearance: alert, cooperative, appears stated age and no distress Resp: clear to auscultation bilaterally Cardio: regular rate and rhythm Incision/Wound: Continued superficial slough of skin over flap noted and slight increase in serous drainage from medial and lateral skin edges today. JP drainage mostly serous   Lab Results:   Recent Labs  04/07/12 0900 04/08/12 0445  WBC 11.4* 9.8  HGB 9.0* 8.6*  HCT 27.4* 25.6*  PLT 349 381   BMET No results found for this basename: NA, K, CL, CO2, GLUCOSE, BUN, CREATININE, CALCIUM,  in the last 72 hours PT/INR No results found for this basename: LABPROT, INR,  in the last 72 hours ABG No results found for this basename: PHART, PCO2, PO2, HCO3,  in the last 72 hours  Studies/Results: No results found.  Anti-infectives: Anti-infectives   Start     Dose/Rate Route Frequency Ordered Stop   04/04/12 1630  piperacillin-tazobactam (ZOSYN) IVPB 3.375 g     3.375 g 100 mL/hr over 30 Minutes Intravenous To Post Anesthesia Care Unit 04/04/12 1617 04/04/12 1744   04/02/12 2100  vancomycin (VANCOCIN) 1,750 mg in sodium chloride 0.9 % 500 mL IVPB     1,750 mg 250 mL/hr over 120 Minutes Intravenous Every 24 hours 04/01/12 2044      04/01/12 2100  piperacillin-tazobactam (ZOSYN) IVPB 3.375 g     3.375 g 12.5 mL/hr over 240 Minutes Intravenous 3 times per day 04/01/12 2044     04/01/12 2045  vancomycin (VANCOCIN) 2,500 mg in sodium chloride 0.9 % 500 mL IVPB     2,500 mg 250 mL/hr over 120 Minutes Intravenous  Once 04/01/12 2044 04/02/12 0018      Assessment/Plan: s/p Procedure(s): THROMBECTOMY FEMORAL ARTERY (Left) AORTOGRAM (Left) MUSCLE FLAP CLOSURE (Left) PATCH ANGIOPLASTY (Left) Would recommend another day of IV antibiotics.  She should shower today. Continue to recline when in bed and sitting.  Continue to ambulate Dr. Kelly Splinter will see later today.   LOS: 8 days    Franki Monte Plastic Surgery 04/08/2012 [phone]

## 2012-04-08 NOTE — Progress Notes (Signed)
ANTICOAGULATION CONSULT NOTE  Pharmacy Consult for Heparin Indication: LE Ischemia  Allergies  Allergen Reactions  . Codeine Nausea Only    Patient Measurements: Height: 5\' 7"  (170.2 cm) Weight: 275 lb 9.2 oz (125 kg) IBW/kg (Calculated) : 61.6 Heparin Dosing Weight: 91 kg  Vital Signs: Temp: 97.9 F (36.6 C) (03/10 2016) Temp src: Oral (03/10 2016) BP: 113/84 mmHg (03/11 0428) Pulse Rate: 71 (03/11 0428)  Labs:  Recent Labs  04/06/12 0510  04/07/12 0900 04/07/12 1658 04/08/12 0445  HGB 9.1*  --  9.0*  --  8.6*  HCT 27.8*  --  27.4*  --  25.6*  PLT 305  --  349  --  381  HEPARINUNFRC <0.10*  < > 0.19* 0.36 0.25*  < > = values in this interval not displayed.  Estimated Creatinine Clearance: 61.2 ml/min (by C-G formula based on Cr of 1.46).   Assessment: 52 year old female s/p LE thrombectomy, for heparin   Goal of Therapy:  Heparin level 0.3-0.7 units/ml Monitor platelets by anticoagulation protocol: Yes   Plan:  Increase Heparin 3000 units/hr  Geannie Risen, PharmD, BCPS  .04/08/2012,5:53 AM

## 2012-04-08 NOTE — Progress Notes (Signed)
ANTIBIOTIC CONSULT NOTE - FOLLOW UP  Pharmacy Consult for Vancomycin/Zosyn Indication: Groin Infection  Allergies  Allergen Reactions  . Codeine Nausea Only    Patient Measurements: Height: 5\' 7"  (170.2 cm) Weight: 275 lb 9.2 oz (125 kg) IBW/kg (Calculated) : 61.6  Vital Signs: BP: 113/84 mmHg (03/11 0428) Pulse Rate: 71 (03/11 0428) Intake/Output from previous day: 03/10 0701 - 03/11 0700 In: 1218.3 [P.O.:720; I.V.:448.3; IV Piggyback:50] Out: 157 [Urine:2; Drains:155] Intake/Output from this shift:    Labs:  Recent Labs  04/06/12 0510 04/07/12 0900 04/08/12 0445  WBC 14.5* 11.4* 9.8  HGB 9.1* 9.0* 8.6*  PLT 305 349 381   Estimated Creatinine Clearance: 61.2 ml/min (by C-G formula based on Cr of 1.46).  Recent Labs  04/05/12 2130  VANCOTROUGH 18.4     Microbiology: Recent Results (from the past 720 hour(s))  MRSA PCR SCREENING     Status: None   Collection Time    03/31/12  5:44 PM      Result Value Range Status   MRSA by PCR NEGATIVE  NEGATIVE Final   Comment:            The GeneXpert MRSA Assay (FDA     approved for NASAL specimens     only), is one component of a     comprehensive MRSA colonization     surveillance program. It is not     intended to diagnose MRSA     infection nor to guide or     monitor treatment for     MRSA infections.  GRAM STAIN     Status: None   Collection Time    04/04/12  9:44 AM      Result Value Range Status   Specimen Description ABSCESS GROIN LEFT   Final   Special Requests PT ON ZONSYN/VANCOMYCIN   Final   Gram Stain     Final   Value: RARE WBC PRESENT,BOTH PMN AND MONONUCLEAR     NO ORGANISMS SEEN     Gram Stain Report Called to,Read Back By and Verified With: Becky Sax RN 10:20 04/04/12 ([email or web address])   Report Status 04/04/2012 FINAL   Final  ANAEROBIC CULTURE     Status: None   Collection Time    04/04/12  9:44 AM      Result Value Range Status   Specimen Description ABSCESS GROIN LEFT   Final   Special  Requests PT ON ZONSYN/VANCOMYCIN   Final   Gram Stain     Final   Value: RARE WBC PRESENT,BOTH PMN AND MONONUCLEAR     NO ORGANISMS SEEN     Gram Stain Report Called to,Read Back By and Verified With: Gram Stain Report Called to,Read Back By and Verified With: C YATES(RN) @10 :20 ON 04/04/12 BY WILSONM Performed at [facility]   Culture     Final   Value: NO ANAEROBES ISOLATED; CULTURE IN PROGRESS FOR 5 DAYS   Report Status PENDING   Incomplete  CULTURE, ROUTINE-ABSCESS     Status: None   Collection Time    04/04/12  9:44 AM      Result Value Range Status   Specimen Description ABSCESS GROIN LEFT   Final   Special Requests PT ON ZONSYN/VANCOMYCIN   Final   Gram Stain     Final   Value: RARE WBC PRESENT,BOTH PMN AND MONONUCLEAR     NO ORGANISMS SEEN     Gram Stain Report Called to,Read Back By and Verified With: Gram Stain  Report Called to,Read Back By and Verified With: C.YATES(RN) @10 :20 ON 04/04/12 BY WILSONM Performed at [facility]   Culture NO GROWTH 3 DAYS   Final   Report Status 04/07/2012 FINAL   Final    Anti-infectives   Start     Dose/Rate Route Frequency Ordered Stop   04/04/12 1630  piperacillin-tazobactam (ZOSYN) IVPB 3.375 g     3.375 g 100 mL/hr over 30 Minutes Intravenous To Post Anesthesia Care Unit 04/04/12 1617 04/04/12 1744   04/02/12 2100  vancomycin (VANCOCIN) 1,750 mg in sodium chloride 0.9 % 500 mL IVPB     1,750 mg 250 mL/hr over 120 Minutes Intravenous Every 24 hours 04/01/12 2044     04/01/12 2100  piperacillin-tazobactam (ZOSYN) IVPB 3.375 g     3.375 g 12.5 mL/hr over 240 Minutes Intravenous 3 times per day 04/01/12 2044     04/01/12 2045  vancomycin (VANCOCIN) 2,500 mg in sodium chloride 0.9 % 500 mL IVPB     2,500 mg 250 mL/hr over 120 Minutes Intravenous  Once 04/01/12 2044 04/02/12 0018      Assessment: 52 yo female on D8 of antibiotics for a groin infection. WBC 9.8 and afebrile. SCr 1.46 (BMET 3/7) and CrCl 61.12ml/min. Groin  culture revealed no growth.  Plan to continue IV antibiotics x1 more day per plastic surgery.   Goal of Therapy:  Vancomycin trough level 15-20 mcg/ml  Plan:  Continue vancomycin 1750mg  IV q24h Continue Zosyn 3.375mg  IV q8h  Monitor renal function   Micheline Chapman PharmD Candidate  04/08/2012,8:39 AM

## 2012-04-08 NOTE — Progress Notes (Signed)
I agree with the above note 

## 2012-04-08 NOTE — Progress Notes (Signed)
Pt will place on mask when ready for bed. No distress noted. Pt encouraged to call RT if needing assistance.

## 2012-04-09 LAB — CREATININE, SERUM
Creatinine, Ser: 1.64 mg/dL — ABNORMAL HIGH (ref 0.50–1.10)
GFR calc non Af Amer: 35 mL/min — ABNORMAL LOW (ref >90)

## 2012-04-09 LAB — CBC
HCT: 27.5 % — ABNORMAL LOW (ref 36.0–46.0)
Hemoglobin: 9 g/dL — ABNORMAL LOW (ref 12.0–15.0)
MCH: 31.4 pg (ref 26.0–34.0)
MCHC: 32.7 g/dL (ref 30.0–36.0)
MCV: 95.8 fL (ref 78.0–100.0)

## 2012-04-09 LAB — GLUCOSE, CAPILLARY

## 2012-04-09 LAB — ANAEROBIC CULTURE

## 2012-04-09 MED ORDER — DABIGATRAN ETEXILATE MESYLATE 150 MG PO CAPS: 150.0000 mg | ORAL_CAPSULE | Freq: Two times a day (BID) | ORAL | Status: DC

## 2012-04-09 MED ORDER — GABAPENTIN 600 MG PO TABS: 600.0000 mg | ORAL_TABLET | Freq: Two times a day (BID) | ORAL | Status: DC

## 2012-04-09 MED ORDER — OXYCODONE HCL 5 MG PO CAPS: 5.0000 mg | ORAL_CAPSULE | Freq: Four times a day (QID) | ORAL | Status: DC | PRN

## 2012-04-09 MED ORDER — LEVOFLOXACIN 500 MG PO TABS: 500.0000 mg | ORAL_TABLET | Freq: Every day | ORAL | Status: DC

## 2012-04-09 NOTE — Progress Notes (Signed)
5 Days Post-Op  Subjective: The patient is post op day 5 and feeling much better.  Objective: Vital signs in last 24 hours: Temp:  [97.2 F (36.2 C)] 97.2 F (36.2 C) (03/11 1340) Pulse Rate:  [69-77] 69 (03/12 0507) Resp:  [18] 18 (03/12 0507) BP: (121-135)/(52-71) 135/52 mmHg (03/12 0507) SpO2:  [95 %-97 %] 97 % (03/12 0507) Weight:  [128.958 kg (284 lb 4.8 oz)] 128.958 kg (284 lb 4.8 oz) (03/12 0507) Last BM Date: 04/07/12  Intake/Output from previous day: 03/11 0701 - 03/12 0700 In: 1466 [P.O.:720; I.V.:746] Out: 125 [Drains:125] Intake/Output this shift:    General appearance: alert, cooperative and no distress Incision/Wound: superficial breakdown of the epithelium but the deeper portion looks alive of the flap.  The leg incision looks very good. Drainage as expected.  Lab Results:   Recent Labs  04/08/12 0445 04/09/12 0510  WBC 9.8 9.3  HGB 8.6* 9.0*  HCT 25.6* 27.5*  PLT 381 424*   BMET No results found for this basename: NA, K, CL, CO2, GLUCOSE, BUN, CREATININE, CALCIUM,  in the last 72 hours PT/INR No results found for this basename: LABPROT, INR,  in the last 72 hours ABG No results found for this basename: PHART, PCO2, PO2, HCO3,  in the last 72 hours  Studies/Results: No results found.  Anti-infectives: Anti-infectives   Start     Dose/Rate Route Frequency Ordered Stop   04/04/12 1630  piperacillin-tazobactam (ZOSYN) IVPB 3.375 g     3.375 g 100 mL/hr over 30 Minutes Intravenous To Post Anesthesia Care Unit 04/04/12 1617 04/04/12 1744   04/02/12 2100  vancomycin (VANCOCIN) 1,750 mg in sodium chloride 0.9 % 500 mL IVPB     1,750 mg 250 mL/hr over 120 Minutes Intravenous Every 24 hours 04/01/12 2044     04/01/12 2100  piperacillin-tazobactam (ZOSYN) IVPB 3.375 g     3.375 g 12.5 mL/hr over 240 Minutes Intravenous 3 times per day 04/01/12 2044     04/01/12 2045  vancomycin (VANCOCIN) 2,500 mg in sodium chloride 0.9 % 500 mL IVPB     2,500 mg 250  mL/hr over 120 Minutes Intravenous  Once 04/01/12 2044 04/02/12 0018      Assessment/Plan: s/p Procedure(s): THROMBECTOMY FEMORAL ARTERY (Left) AORTOGRAM (Left) MUSCLE FLAP CLOSURE (Left) PATCH ANGIOPLASTY (Left) discharge per vascular.  Would like to see in the office a few days after discharge.  May need some superficial debridement but will continue to monitor closely. Will remove drains in the next few days.  LOS: 9 days    [facility] 04/09/2012

## 2012-04-09 NOTE — Progress Notes (Signed)
Assessment unchanged. Discussed D/C instructions with pt. Verbalized understanding. RX given to pt. IV and tele removed. JP drains emptied before leaving. Documentation present in I&O. Pt left via W/C accompanied by NT.

## 2012-04-09 NOTE — Progress Notes (Signed)
[facility] Progress Note  04/09/2012 8:19 AM POD 3  Subjective:  No complaints; thankful for her medical team for her care.  Does inquire about burning in her leg at times.  Afebrile x 24 hrs VSS  Filed Vitals:   04/09/12 0507  BP: 135/52  Pulse: 69  Temp:   Resp: 18    Physical Exam: Incisions:  Looks better today.  Still with some separation at the medial and lateral edge and still with some drainage.  Donor incision site healing nicely. Extremities:  + palpable left DP; foot warm and well perfused.  CBC    Component Value Date/Time   WBC 9.3 04/09/2012 0510   RBC 2.87* 04/09/2012 0510   HGB 9.0* 04/09/2012 0510   HCT 27.5* 04/09/2012 0510   PLT 424* 04/09/2012 0510   MCV 95.8 04/09/2012 0510   MCH 31.4 04/09/2012 0510   MCHC 32.7 04/09/2012 0510   RDW 14.3 04/09/2012 0510   LYMPHSABS 3.0 02/05/2007 0319   MONOABS 1.8* 02/05/2007 0319   EOSABS 0.0 02/05/2007 0319   BASOSABS 0.0 02/05/2007 0319    BMET    Component Value Date/Time   NA 143 04/04/2012 1252   K 4.5 04/04/2012 1252   CL 108 04/04/2012 0500   CO2 21 04/04/2012 0500   GLUCOSE 150* 04/04/2012 0500   BUN 11 04/04/2012 0500   CREATININE 1.46* 04/04/2012 0500   CALCIUM 9.1 04/04/2012 0500   GFRNONAA 40* 04/04/2012 0500   GFRAA 46* 04/04/2012 0500    INR    Component Value Date/Time   INR 1.03 03/31/2012 2056     Intake/Output Summary (Last 24 hours) at 04/09/12 0819 Last data filed at 04/09/12 1610  Gross per 24 hour  Intake   1466 ml  Output    125 ml  Net   1341 ml     Assessment/Plan:  52 y.o. female is s/p:  #1 Redo left femoral artery exposure  #2 ilio-femoral thrombectomy  #3 Left femoral patch angioplasty  #4 Abdominal aortogram  #5 Angioplasty left external iliac artery  and  Left rectus femoris muscle flap to groin    POD 3  -drains to be left in-will have RN teach pt how to empty bulbs and record amounts for Dr. Kelly Splinter -burning in leg-pt is on gabapentin; hopefully this will resolve  over time. -acute surgical blood loss slightly improved -Dr. Myra Gianotti wants the pt to go home on Pradaxa as well as Plavix.  Spoke with pharmacy about dosing for pradaxa.  We will draw a stat Creatinine and decide dosing from there. -She will go home on 10 days of levaquin -f/u with Dr. Kelly Splinter Friday 04/11/12 at 10 am -f/u with Dr. Myra Gianotti 04/21/12 -pt instructed that she may need to change the bandage several times per day so that it does not stay saturated in her left groin.  Pt understands and agrees.   [name], PA-C [facility] [phone] 04/09/2012 8:19 AM

## 2012-04-09 NOTE — Discharge Summary (Signed)
[facility] Discharge Summary  KRITHI BRAY 1960/02/15 52 y.o. female  [id number]  Admission Date: 03/31/2012  Discharge Date: 04/11/12  Physician: Nada Libman, MD  Admission Diagnosis: aschemic left leg claudication   HPI:   This is a 52 y.o. female who is S/P Left fem endarterectomy and then debridement left groin wound. she had been home and having wound vac changes Q M,W,F. Nurse today noted increased brownish drainage from the wound like old blood. No active bright red bleading. Pt notes the left leg is now numb and has been that way since this am. She denies pain in the leg and has good motion. She states her toes become ruberous when in the dependent position.  Hospital Course:  The patient was admitted to the hospital and taken to the operating room on 03/31/2012 - 04/04/2012 and underwent  #1 Redo left femoral artery exposure  #2 ilio-femoral thrombectomy  #3 Left femoral patch angioplasty  #4 Abdominal aortogram  #5 Angioplasty left external iliac artery   And Left rectus femoris muscle flap to groin  The pt tolerated the procedure well and was transported to the PACU in good condition. By POD 1, her groin flap did have some duskiness medially and laterally as well as superficial slough medially.  Her distal pulses were in tact and the numbness was resolved in her left foot.  Her JP drains remained with moderate drainage and is being managed by plastics.  She did have some acute surgical blood loss anemia, which she was asymptomatic.  On POD 2, she did have some separation of the graft medially and laterally.  On POD 3, her graft was looking better.  Her donor skin site is healing nicely.    She was placed on heparin gtt post operatively and remained on this until discharge when she is placed on pradaxa. Dr. Myra Gianotti is putting the pt on Pradaxa in addition to Plavix.  She will go home on levaquin for 10 days.    Discontinued pt's metformin as well as  ARB due to increased creatinine.  I discussed with pt that she needs to f/u with her PCP in the next week for f/u of these medications and possible substitutions and it is important to keep DM under control for wound healing.  She understands and agrees and states she will get an appt in the next week.  The remainder of the hospital course consisted of increasing mobilization and increasing intake of solids without difficulty.  CBC    Component Value Date/Time   WBC 9.3 04/09/2012 0510   RBC 2.87* 04/09/2012 0510   HGB 9.0* 04/09/2012 0510   HCT 27.5* 04/09/2012 0510   PLT 424* 04/09/2012 0510   MCV 95.8 04/09/2012 0510   MCH 31.4 04/09/2012 0510   MCHC 32.7 04/09/2012 0510   RDW 14.3 04/09/2012 0510   LYMPHSABS 3.0 02/05/2007 0319   MONOABS 1.8* 02/05/2007 0319   EOSABS 0.0 02/05/2007 0319   BASOSABS 0.0 02/05/2007 0319    BMET    Component Value Date/Time   NA 143 04/04/2012 1252   K 4.5 04/04/2012 1252   CL 108 04/04/2012 0500   CO2 21 04/04/2012 0500   GLUCOSE 150* 04/04/2012 0500   BUN 11 04/04/2012 0500   CREATININE 1.64* 04/09/2012 1036   CALCIUM 9.1 04/04/2012 0500   GFRNONAA 35* 04/09/2012 1036   GFRAA 40* 04/09/2012 1036      Discharge Instructions:   The patient is discharged to home with  extensive instructions on wound care and progressive ambulation.  They are instructed not to drive or perform any heavy lifting until returning to see the physician in his office.    Discharge Diagnosis:  aschemic left leg claudication  Secondary Diagnosis: Patient Active Problem List  Diagnosis  . DIABETES MELLITUS, TYPE II  . OBESITY  . HYPERTENSION  . UNSTABLE ANGINA  . CONGESTIVE HEART FAILURE  . DIASTOLIC DYSFUNCTION  . GERD  . SLEEP APNEA, MILD  . HEADACHE, CHRONIC  . Leg pain, bilateral  . Low back pain  . Bilateral knee pain  . Hip pain, bilateral  . Atherosclerosis of native arteries of the extremities with intermittent claudication  . Peripheral vascular disease, unspecified  .  Pain in the groin  . Pain in limb   Past Medical History  Diagnosis Date  . Diabetes mellitus   . GERD (gastroesophageal reflux disease)   . Anxiety   . CHF (congestive heart failure)   . COPD (chronic obstructive pulmonary disease)   . Sleep apnea     severe OSA by 09/08/06 sleep study ([facility])  . Depression        Medication List    TAKE these medications       albuterol 108 (90 BASE) MCG/ACT inhaler  Commonly known as:  PROVENTIL HFA;VENTOLIN HFA  Inhale 2 puffs into the lungs every 6 (six) hours as needed. For wheezing and shortness of breath.     albuterol (5 MG/ML) 0.5% nebulizer solution  Commonly known as:  PROVENTIL  Take 2.5 mg by nebulization every 6 (six) hours as needed. For wheezing and shortness of breath.     buPROPion 300 MG 24 hr tablet  Commonly known as:  WELLBUTRIN XL  Take 300 mg by mouth daily.     calcium carbonate 600 MG Tabs  Commonly known as:  OS-CAL  Take 600 mg by mouth 2 (two) times daily with a meal.     clopidogrel 75 MG tablet  Commonly known as:  PLAVIX  Take 1 tablet by mouth daily.     DULoxetine 60 MG capsule  Commonly known as:  CYMBALTA  Take 60 mg by mouth daily.     ferrous fumarate 325 (106 FE) MG Tabs  Commonly known as:  HEMOCYTE - 106 mg FE  Take 1 tablet by mouth.     gabapentin 600 MG tablet  Commonly known as:  NEURONTIN  Take 1 tablet (600 mg total) by mouth 2 (two) times daily.     glipiZIDE 5 MG tablet  Commonly known as:  GLUCOTROL  Take 5 mg by mouth 2 (two) times daily before a meal.     indomethacin 50 MG capsule  Commonly known as:  INDOCIN     lansoprazole 30 MG capsule  Commonly known as:  PREVACID  Take 30 mg by mouth 2 (two) times daily.     levofloxacin 500 MG tablet  Commonly known as:  LEVAQUIN  Take 1 tablet (500 mg total) by mouth daily.     multivitamin tablet  Take 1 tablet by mouth daily.     oxycodone 5 MG capsule  Commonly known as:  OXY-IR  Take 1 capsule (5 mg total) by  mouth every 6 (six) hours as needed. For pain     promethazine 25 MG tablet  Commonly known as:  PHENERGAN  Take 25 mg by mouth every 6 (six) hours as needed. For nausea     simvastatin 20 MG tablet  Commonly  known as:  ZOCOR  Take 20 mg by mouth every evening.     sitaGLIPtan-metformin 50-1000 MG per tablet  Commonly known as:  JANUMET  Take 1 tablet by mouth 2 (two) times daily with a meal.     telmisartan-hydrochlorothiazide 80-25 MG per tablet  Commonly known as:  MICARDIS HCT  Take 1 tablet by mouth daily.     topiramate 50 MG tablet  Commonly known as:  TOPAMAX  Take 50 mg by mouth 2 (two) times daily.       Levaquin 500 mg #10 NR given OxyIR #30 NR given  Disposition: home with [facility] PT/OT/RN  Patient's condition: is Good  Follow up: 1. Dr. Myra Gianotti 04/28/12 (pt will be out of town on 04/21/12-Dr. Brabham aware) d/w pt if she has any problems to call our office. 2. Dr. Kelly Splinter 04/11/12 at 10 am   [name], PA-C [facility] [phone] 04/09/2012  9:53 AM

## 2012-04-09 NOTE — Progress Notes (Addendum)
ANTICOAGULATION CONSULT NOTE  Pharmacy Consult for Heparin Indication: LE Ischemia  Allergies  Allergen Reactions  . Codeine Nausea Only    Patient Measurements: Height: 5\' 7"  (170.2 cm) Weight: 284 lb 4.8 oz (128.958 kg) IBW/kg (Calculated) : 61.6 Heparin Dosing Weight: 91 kg  Vital Signs: BP: 135/52 mmHg (03/12 0507) Pulse Rate: 69 (03/12 0507)  Labs:  Recent Labs  04/07/12 0900  04/08/12 0445 04/08/12 1321 04/08/12 2234 04/09/12 0510  HGB 9.0*  --  8.6*  --   --  9.0*  HCT 27.4*  --  25.6*  --   --  27.5*  PLT 349  --  381  --   --  424*  HEPARINUNFRC 0.19*  < > 0.25* 0.19* 0.63 0.77*  < > = values in this interval not displayed.  Estimated Creatinine Clearance: 62.3 ml/min (by C-G formula based on Cr of 1.46).   Assessment:  52 year old female who is s/p left femoral endarterectomy 02/22/12, left groin wound debridement 03/01/12 and femoral artery thrombectomy 3/7 to restart IV heparin, per MD request  AAnticoagulation:  Heparin level 0.77 slightly >goal. (Patient previously therapeutic on heparin 2500-2800 units/hr).  Infectious Diseases: Day # 9 Vanc/Zosyn for left groin wound at femoral endarterectomy op site, has wound vac. Afeb, WBC 9.3. Groin abscess culture negative. Scr stable (BMET 3/7) and CrCl 61.33ml/min.   3/4 vancomycin >> 3/4 zosyn >> 3/8 VT - 18.3 -- no change  Neurology: Anxiety/Depression - gabapentin, topamax, wellbutrin, cymbalta  Pulmonary: COPD, OSA. On albuterol PRN. 99% on CPAP.   Cardiovascular: CHF - zocor, Plavix. BP 135/52, HR 69  Gastrointestinal: GERD - PPI (on PTA)  Endocrinology: DM - SSI, glipizide, CBGs 100-167, A1c 7.8%  Fluids/Electrolytes/Nutrition: lytes ok (BMET 3/7). On OS-CAL.   Nephrology: SCr 1.46 and CrCl 61.34ml/min.  Hematology/Oncology: Hx anemia. On ferrous fumarate.  Best Practices: SCDs, PPI, heparin  PTA Medication Problems: sitagliptin/metformin, mvi  Plan:  - Decrease heparin slightly to 3100  units/hr. - Continue zosyn 3.375 g IV Q 8 hrs. Stop today? - Continue vancomycin 1750mg  q24h. Stop today? - Long-term anticoagulation plans?   Crystal S. Merilynn Finland, PharmD, [facility] Clinical Staff Pharmacist Pager [phone]  .04/09/2012,9:35 AM

## 2012-04-10 ENCOUNTER — Telehealth: Payer: Self-pay | Admitting: Surgery

## 2012-04-10 DIAGNOSIS — J449 Chronic obstructive pulmonary disease, unspecified: Secondary | ICD-10-CM | POA: Diagnosis not present

## 2012-04-10 DIAGNOSIS — R5381 Other malaise: Secondary | ICD-10-CM | POA: Diagnosis not present

## 2012-04-10 DIAGNOSIS — T8189XA Other complications of procedures, not elsewhere classified, initial encounter: Secondary | ICD-10-CM | POA: Diagnosis not present

## 2012-04-10 DIAGNOSIS — E119 Type 2 diabetes mellitus without complications: Secondary | ICD-10-CM | POA: Diagnosis not present

## 2012-04-10 DIAGNOSIS — F341 Dysthymic disorder: Secondary | ICD-10-CM | POA: Diagnosis not present

## 2012-04-10 DIAGNOSIS — I509 Heart failure, unspecified: Secondary | ICD-10-CM | POA: Diagnosis not present

## 2012-04-10 NOTE — Telephone Encounter (Signed)
Spoke with husband, pt sleeping, gave him appt info - kf

## 2012-04-10 NOTE — Telephone Encounter (Signed)
Message copied by Margaretmary Eddy on Thu Apr 10, 2012 10:49 AM ------      Message from: Lorin Mercy K      Created: Wed Apr 09, 2012 10:20 AM      Regarding: schedule                   ----- Message -----         From: Dara Lords, PA-C         Sent: 04/09/2012   9:48 AM           To: Sharee Pimple, CMA            She is s/p:      #1 Redo left femoral artery exposure        #2 ilio-femoral thrombectomy        #3 Left femoral patch angioplasty        #4 Abdominal aortogram        #5 Angioplasty left external iliac artery        and        Left rectus femoris muscle flap to groin            3 days ago by Dr. Myra Gianotti and [facility].  She is to f/u with Dr. Myra Gianotti 04/21/12.            Thanks,      Lelon Mast ------

## 2012-04-11 DIAGNOSIS — R5383 Other fatigue: Secondary | ICD-10-CM | POA: Diagnosis not present

## 2012-04-11 DIAGNOSIS — E119 Type 2 diabetes mellitus without complications: Secondary | ICD-10-CM | POA: Diagnosis not present

## 2012-04-11 DIAGNOSIS — T8189XA Other complications of procedures, not elsewhere classified, initial encounter: Secondary | ICD-10-CM | POA: Diagnosis not present

## 2012-04-11 DIAGNOSIS — J449 Chronic obstructive pulmonary disease, unspecified: Secondary | ICD-10-CM | POA: Diagnosis not present

## 2012-04-11 DIAGNOSIS — F341 Dysthymic disorder: Secondary | ICD-10-CM | POA: Diagnosis not present

## 2012-04-11 DIAGNOSIS — I509 Heart failure, unspecified: Secondary | ICD-10-CM | POA: Diagnosis not present

## 2012-04-11 NOTE — Op Note (Signed)
Op note:  [name]  ASSISTANT none  PREOPERATIVE DIAGNOSIS 1. Exposed left groin femoral graft  POSTOPERATIVE DIAGNOSIS 1. Exposed left groin femoral graft  PROCEDURES 1. Left rectus femoris myocutaneous flap to groin  COMPLICATIONS: None.  DRAINS: JPs x2 (one in the left leg and one in the left groin)  INDICATIONS FOR PROCEDURE The patient is a 52 y.o. year-old female with an exposed femoral graft.      CONSENT Informed consent was obtained directly from the patient. The risks, benefits and alternatives were fully discussed. Specific risks including but not limited to bleeding, infection, hematoma, seroma, scarring, pain ,loss of the flap and need for further surgery were discussed. The patient had ample opportunity to have her questions answered to her satisfaction.  DESCRIPTION OF PROCEDURE  Patient was brought into the operating room and placed in a supine position.  The patient underwent general anesthesia.  A timeout was performed. Vascular surgery performed their portion of the case which included repair of the femoral artery.  She was then rendered to the plastic and reconstructive surgery team.  The incision lines over the rectus muscle of the left leg were injected with 1% Xylocaine with epinephrine.  After waiting for vasoconstriction, the marked lateral line was incised with a #10 blade.  The bovie was used to dissect down to the rectus muscle.  Once located medial to the vastus lateralis the rectus was freed from the surrounding tissue.  A doppler was used to confirm the descending circumflex femoral artery was patent and confirmed with the angiogram from earlier.  A counter incision was made at the distal leg with 1.5 cm.  The bovie was used to dissect down to the rectus muscle and tendon.  The tendon was transected. Hemostasis was achieved. Once free distally attention was turned to the proximal portion.  The fascia was released and the muscle freed to be able to rotate  into the groin.  The muscle with the skin island was rotated into the groin.    A jp drain was placed in the leg and brought out from the distal incision.  The deep tissues of the left leg were approximated with 3-0 vicryl sutures followed by 4-0 vicryl and the skin was closed with 5-0 Monocryl sutures.  Dermabond was applied.  The drain was secured with a 3-0 silk suture. The distal incision was closed with a running 5-0 monocryl followed by triple antibiotic and xeroform.  The flap had good color and capillary refill.  The lateral portion of the groin was sutured closed with 3-0 monocryl to decrease the size of the defect.  A drain was placed on the lateral aspect of the defect (not over the graft) and secured to the skin with a 3-0 silk. The skin edges were then approximated with a combination of 3-0 and 4-0 monocryl.  Triple antibiotic ointment and xeroform was applied.  Gauze and an ABD were placed over the area and secured with tape so not to be tight or constrictive.  The patient tolerated the procedure well. The patient was allowed to wake and from anesthesia and taken to the recovery room in satisfactory condition. There were no complications.

## 2012-04-13 NOTE — Discharge Summary (Signed)
Agree with the above  Stephanie Peck 

## 2012-04-14 ENCOUNTER — Telehealth: Payer: Self-pay

## 2012-04-14 DIAGNOSIS — E119 Type 2 diabetes mellitus without complications: Secondary | ICD-10-CM | POA: Diagnosis not present

## 2012-04-14 DIAGNOSIS — G8918 Other acute postprocedural pain: Secondary | ICD-10-CM

## 2012-04-14 DIAGNOSIS — R5383 Other fatigue: Secondary | ICD-10-CM | POA: Diagnosis not present

## 2012-04-14 DIAGNOSIS — I509 Heart failure, unspecified: Secondary | ICD-10-CM | POA: Diagnosis not present

## 2012-04-14 DIAGNOSIS — T8189XA Other complications of procedures, not elsewhere classified, initial encounter: Secondary | ICD-10-CM | POA: Diagnosis not present

## 2012-04-14 DIAGNOSIS — F341 Dysthymic disorder: Secondary | ICD-10-CM | POA: Diagnosis not present

## 2012-04-14 DIAGNOSIS — J449 Chronic obstructive pulmonary disease, unspecified: Secondary | ICD-10-CM | POA: Diagnosis not present

## 2012-04-14 MED ORDER — OXYCODONE HCL 5 MG PO CAPS: 5.0000 mg | ORAL_CAPSULE | Freq: Four times a day (QID) | ORAL | Status: DC | PRN

## 2012-04-14 NOTE — Telephone Encounter (Signed)
Phone call from pt.  Reports increased swelling of left leg with redness and puffiness of incision left leg.  States she is elevating her left leg above level of heart at intervals.  Also reports excessive drainage of left groin.  States having to change her bandage about every 30 minutes due to saturation from a "deep red drainage".  Denies fevers/ chills.  Reports saw Dr. Kelly Splinter on 3/14, and "she clipped something because she didn't think it was draining right".  Since seeing  Dr. Kelly Splinter, reports the drainage has worsened. Pt. States she has appt. With Dr. Kelly Splinter tomorrow.  Asking to see Dr. Myra Gianotti today, for him to check the leg.  Advised Dr. Myra Gianotti not taking patients this afternoon.  Discussed with Dr. Myra Gianotti.  Advised to have Dr. Kelly Splinter follow the wound drainage.  Pt. also requests pain medication. States she will be traveling on Friday to a wedding for her son and will need pain medication; rates pain level at "7-8".  Per Dr. Myra Gianotti, okay to give pt. Oxycodone 5 mg, 1  Tab every 6 hrs./prn/ pain; #50, no refills.  Pt. made aware of Rx and will have a relative pick up prescription at the office.

## 2012-04-15 DIAGNOSIS — E1159 Type 2 diabetes mellitus with other circulatory complications: Secondary | ICD-10-CM | POA: Diagnosis not present

## 2012-04-15 DIAGNOSIS — Z23 Encounter for immunization: Secondary | ICD-10-CM | POA: Diagnosis not present

## 2012-04-15 DIAGNOSIS — E1165 Type 2 diabetes mellitus with hyperglycemia: Secondary | ICD-10-CM | POA: Diagnosis not present

## 2012-04-15 DIAGNOSIS — I10 Essential (primary) hypertension: Secondary | ICD-10-CM | POA: Diagnosis not present

## 2012-04-15 DIAGNOSIS — N183 Chronic kidney disease, stage 3 unspecified: Secondary | ICD-10-CM | POA: Diagnosis not present

## 2012-04-15 DIAGNOSIS — E1129 Type 2 diabetes mellitus with other diabetic kidney complication: Secondary | ICD-10-CM | POA: Diagnosis not present

## 2012-04-16 DIAGNOSIS — R5383 Other fatigue: Secondary | ICD-10-CM | POA: Diagnosis not present

## 2012-04-16 DIAGNOSIS — J449 Chronic obstructive pulmonary disease, unspecified: Secondary | ICD-10-CM | POA: Diagnosis not present

## 2012-04-16 DIAGNOSIS — T8189XA Other complications of procedures, not elsewhere classified, initial encounter: Secondary | ICD-10-CM | POA: Diagnosis not present

## 2012-04-16 DIAGNOSIS — F341 Dysthymic disorder: Secondary | ICD-10-CM | POA: Diagnosis not present

## 2012-04-16 DIAGNOSIS — E119 Type 2 diabetes mellitus without complications: Secondary | ICD-10-CM | POA: Diagnosis not present

## 2012-04-16 DIAGNOSIS — I509 Heart failure, unspecified: Secondary | ICD-10-CM | POA: Diagnosis not present

## 2012-04-21 ENCOUNTER — Ambulatory Visit: Payer: Medicare Other | Admitting: Surgery

## 2012-04-23 DIAGNOSIS — J449 Chronic obstructive pulmonary disease, unspecified: Secondary | ICD-10-CM | POA: Diagnosis not present

## 2012-04-23 DIAGNOSIS — R5383 Other fatigue: Secondary | ICD-10-CM | POA: Diagnosis not present

## 2012-04-23 DIAGNOSIS — E119 Type 2 diabetes mellitus without complications: Secondary | ICD-10-CM | POA: Diagnosis not present

## 2012-04-23 DIAGNOSIS — T8189XA Other complications of procedures, not elsewhere classified, initial encounter: Secondary | ICD-10-CM | POA: Diagnosis not present

## 2012-04-23 DIAGNOSIS — I509 Heart failure, unspecified: Secondary | ICD-10-CM | POA: Diagnosis not present

## 2012-04-23 DIAGNOSIS — F341 Dysthymic disorder: Secondary | ICD-10-CM | POA: Diagnosis not present

## 2012-04-25 ENCOUNTER — Encounter: Payer: Self-pay | Admitting: Surgery

## 2012-04-25 DIAGNOSIS — F341 Dysthymic disorder: Secondary | ICD-10-CM | POA: Diagnosis not present

## 2012-04-25 DIAGNOSIS — E119 Type 2 diabetes mellitus without complications: Secondary | ICD-10-CM | POA: Diagnosis not present

## 2012-04-25 DIAGNOSIS — T8189XA Other complications of procedures, not elsewhere classified, initial encounter: Secondary | ICD-10-CM | POA: Diagnosis not present

## 2012-04-25 DIAGNOSIS — I509 Heart failure, unspecified: Secondary | ICD-10-CM | POA: Diagnosis not present

## 2012-04-25 DIAGNOSIS — R5381 Other malaise: Secondary | ICD-10-CM | POA: Diagnosis not present

## 2012-04-25 DIAGNOSIS — J449 Chronic obstructive pulmonary disease, unspecified: Secondary | ICD-10-CM | POA: Diagnosis not present

## 2012-04-28 ENCOUNTER — Encounter: Payer: Self-pay | Admitting: Surgery

## 2012-04-28 ENCOUNTER — Ambulatory Visit (INDEPENDENT_AMBULATORY_CARE_PROVIDER_SITE_OTHER): Payer: Medicare Other | Admitting: Surgery

## 2012-04-28 VITALS — BP 143/74 | HR 77 | Temp 97.3°F | Resp 14 | Ht 67.0 in | Wt 285.0 lb

## 2012-04-28 DIAGNOSIS — I723 Aneurysm of iliac artery: Secondary | ICD-10-CM | POA: Insufficient documentation

## 2012-04-28 DIAGNOSIS — J449 Chronic obstructive pulmonary disease, unspecified: Secondary | ICD-10-CM | POA: Diagnosis not present

## 2012-04-28 DIAGNOSIS — T8189XA Other complications of procedures, not elsewhere classified, initial encounter: Secondary | ICD-10-CM | POA: Diagnosis not present

## 2012-04-28 DIAGNOSIS — I509 Heart failure, unspecified: Secondary | ICD-10-CM | POA: Diagnosis not present

## 2012-04-28 DIAGNOSIS — F341 Dysthymic disorder: Secondary | ICD-10-CM | POA: Diagnosis not present

## 2012-04-28 DIAGNOSIS — E119 Type 2 diabetes mellitus without complications: Secondary | ICD-10-CM | POA: Diagnosis not present

## 2012-04-28 DIAGNOSIS — R5381 Other malaise: Secondary | ICD-10-CM | POA: Diagnosis not present

## 2012-04-28 DIAGNOSIS — Z4889 Encounter for other specified surgical aftercare: Secondary | ICD-10-CM | POA: Insufficient documentation

## 2012-04-28 NOTE — Progress Notes (Signed)
The patient is here today for followup. On 04/07/2012 she underwent redo left femoral endarterectomy with patch angioplasty. I previously performed a left femoral endarterectomy and this has occluded. She came in with numbness to her left foot. Dr. Kelly Splinter provided a muscle flap for wound coverage. The patient's numbness resolved after her operation. She has been getting wound care since that time.  On examination the left groin continues to heal. There is a granulation tissue at the base of the wound with surrounding fibrinopurulent tissue which has been debrided by Dr. Kelly Splinter recently. The patient on her first day at home injuring her left fifth toe. This is been slow to heal.  She has a biphasic Doppler signal in her left dorsalis pedis artery.  The patient will continue with wound care as directed by Dr. Kelly Splinter. She states that consideration is being made for a surgical debridement and wound VAC placement. I would be in favor of this should they agree on it. Otherwise, I will have her back in my office in 3 weeks.

## 2012-04-29 DIAGNOSIS — I509 Heart failure, unspecified: Secondary | ICD-10-CM | POA: Diagnosis not present

## 2012-04-29 DIAGNOSIS — T8189XA Other complications of procedures, not elsewhere classified, initial encounter: Secondary | ICD-10-CM | POA: Diagnosis not present

## 2012-04-29 DIAGNOSIS — E119 Type 2 diabetes mellitus without complications: Secondary | ICD-10-CM | POA: Diagnosis not present

## 2012-04-29 DIAGNOSIS — F341 Dysthymic disorder: Secondary | ICD-10-CM | POA: Diagnosis not present

## 2012-04-29 DIAGNOSIS — J449 Chronic obstructive pulmonary disease, unspecified: Secondary | ICD-10-CM | POA: Diagnosis not present

## 2012-04-29 DIAGNOSIS — R5381 Other malaise: Secondary | ICD-10-CM | POA: Diagnosis not present

## 2012-04-30 ENCOUNTER — Other Ambulatory Visit: Payer: Self-pay | Admitting: *Deleted

## 2012-04-30 DIAGNOSIS — J449 Chronic obstructive pulmonary disease, unspecified: Secondary | ICD-10-CM | POA: Diagnosis not present

## 2012-04-30 DIAGNOSIS — I739 Peripheral vascular disease, unspecified: Secondary | ICD-10-CM

## 2012-04-30 DIAGNOSIS — E119 Type 2 diabetes mellitus without complications: Secondary | ICD-10-CM | POA: Diagnosis not present

## 2012-04-30 DIAGNOSIS — T8189XA Other complications of procedures, not elsewhere classified, initial encounter: Secondary | ICD-10-CM | POA: Diagnosis not present

## 2012-04-30 DIAGNOSIS — R5383 Other fatigue: Secondary | ICD-10-CM | POA: Diagnosis not present

## 2012-04-30 DIAGNOSIS — I509 Heart failure, unspecified: Secondary | ICD-10-CM | POA: Diagnosis not present

## 2012-04-30 DIAGNOSIS — F341 Dysthymic disorder: Secondary | ICD-10-CM | POA: Diagnosis not present

## 2012-05-02 DIAGNOSIS — R5383 Other fatigue: Secondary | ICD-10-CM | POA: Diagnosis not present

## 2012-05-02 DIAGNOSIS — J449 Chronic obstructive pulmonary disease, unspecified: Secondary | ICD-10-CM | POA: Diagnosis not present

## 2012-05-02 DIAGNOSIS — F341 Dysthymic disorder: Secondary | ICD-10-CM | POA: Diagnosis not present

## 2012-05-02 DIAGNOSIS — T8189XA Other complications of procedures, not elsewhere classified, initial encounter: Secondary | ICD-10-CM | POA: Diagnosis not present

## 2012-05-02 DIAGNOSIS — E119 Type 2 diabetes mellitus without complications: Secondary | ICD-10-CM | POA: Diagnosis not present

## 2012-05-02 DIAGNOSIS — I509 Heart failure, unspecified: Secondary | ICD-10-CM | POA: Diagnosis not present

## 2012-05-03 DIAGNOSIS — F341 Dysthymic disorder: Secondary | ICD-10-CM | POA: Diagnosis not present

## 2012-05-03 DIAGNOSIS — R5383 Other fatigue: Secondary | ICD-10-CM | POA: Diagnosis not present

## 2012-05-03 DIAGNOSIS — J449 Chronic obstructive pulmonary disease, unspecified: Secondary | ICD-10-CM | POA: Diagnosis not present

## 2012-05-03 DIAGNOSIS — T8189XA Other complications of procedures, not elsewhere classified, initial encounter: Secondary | ICD-10-CM | POA: Diagnosis not present

## 2012-05-03 DIAGNOSIS — E119 Type 2 diabetes mellitus without complications: Secondary | ICD-10-CM | POA: Diagnosis not present

## 2012-05-03 DIAGNOSIS — I509 Heart failure, unspecified: Secondary | ICD-10-CM | POA: Diagnosis not present

## 2012-05-05 ENCOUNTER — Encounter (HOSPITAL_BASED_OUTPATIENT_CLINIC_OR_DEPARTMENT_OTHER): Payer: Medicare Other | Attending: Plastic Surgery

## 2012-05-05 DIAGNOSIS — F341 Dysthymic disorder: Secondary | ICD-10-CM | POA: Diagnosis not present

## 2012-05-05 DIAGNOSIS — I509 Heart failure, unspecified: Secondary | ICD-10-CM | POA: Diagnosis not present

## 2012-05-05 DIAGNOSIS — E119 Type 2 diabetes mellitus without complications: Secondary | ICD-10-CM | POA: Diagnosis not present

## 2012-05-05 DIAGNOSIS — L98499 Non-pressure chronic ulcer of skin of other sites with unspecified severity: Secondary | ICD-10-CM | POA: Diagnosis not present

## 2012-05-05 DIAGNOSIS — J449 Chronic obstructive pulmonary disease, unspecified: Secondary | ICD-10-CM | POA: Diagnosis not present

## 2012-05-05 DIAGNOSIS — T8189XA Other complications of procedures, not elsewhere classified, initial encounter: Secondary | ICD-10-CM | POA: Diagnosis not present

## 2012-05-05 DIAGNOSIS — R5383 Other fatigue: Secondary | ICD-10-CM | POA: Diagnosis not present

## 2012-05-06 DIAGNOSIS — T8189XA Other complications of procedures, not elsewhere classified, initial encounter: Secondary | ICD-10-CM | POA: Diagnosis not present

## 2012-05-06 DIAGNOSIS — I509 Heart failure, unspecified: Secondary | ICD-10-CM | POA: Diagnosis not present

## 2012-05-06 DIAGNOSIS — R5381 Other malaise: Secondary | ICD-10-CM | POA: Diagnosis not present

## 2012-05-06 DIAGNOSIS — R5383 Other fatigue: Secondary | ICD-10-CM | POA: Diagnosis not present

## 2012-05-06 DIAGNOSIS — J449 Chronic obstructive pulmonary disease, unspecified: Secondary | ICD-10-CM | POA: Diagnosis not present

## 2012-05-06 DIAGNOSIS — F341 Dysthymic disorder: Secondary | ICD-10-CM | POA: Diagnosis not present

## 2012-05-06 DIAGNOSIS — E119 Type 2 diabetes mellitus without complications: Secondary | ICD-10-CM | POA: Diagnosis not present

## 2012-05-06 NOTE — Progress Notes (Signed)
[facility]  NAME:  [name], [name]                ACCOUNT NO.:  [id number]  MEDICAL RECORD NO.:  [id number]      DATE OF BIRTH:  September 24, 1960  PHYSICIAN:  Wayland Denis, DO       VISIT DATE:  05/05/2012                                  OFFICE VISIT   The patient is a 52 year old female who is here for followup on her left groin ulcer.  She underwent vascular surgery on the femoral artery.  She had some occlusion problem, ended up with a hematoma and wound.  She then had repeat surgery with biologic patch placed and a rectus femoris muscle flap with skin and subcutaneous tissue.  Skin and subcutaneous tissue for the most part did not survive, but the muscle looks wonderful, pink, and granulating.  She had a lot of lymphedema in the area with lymph drainage and that has improved greatly.  At this point, I think she would benefit greatly from [facility] dressing to help decrease the fluid and improve the vascularity.  We will be able to treat this with ACell.  Her wound is 5.3 x 8 x 2.0.  We will do alginate right now and then we will see her back in 1 week.     Wayland Denis, DO     CS/MEDQ  D:  05/05/2012  T:  05/06/2012  Job:  [id number]

## 2012-05-07 DIAGNOSIS — E119 Type 2 diabetes mellitus without complications: Secondary | ICD-10-CM | POA: Diagnosis not present

## 2012-05-07 DIAGNOSIS — F341 Dysthymic disorder: Secondary | ICD-10-CM | POA: Diagnosis not present

## 2012-05-07 DIAGNOSIS — J449 Chronic obstructive pulmonary disease, unspecified: Secondary | ICD-10-CM | POA: Diagnosis not present

## 2012-05-07 DIAGNOSIS — I509 Heart failure, unspecified: Secondary | ICD-10-CM | POA: Diagnosis not present

## 2012-05-07 DIAGNOSIS — Z5189 Encounter for other specified aftercare: Secondary | ICD-10-CM | POA: Diagnosis not present

## 2012-05-07 DIAGNOSIS — F17200 Nicotine dependence, unspecified, uncomplicated: Secondary | ICD-10-CM | POA: Diagnosis not present

## 2012-05-07 DIAGNOSIS — R5383 Other fatigue: Secondary | ICD-10-CM | POA: Diagnosis not present

## 2012-05-07 DIAGNOSIS — E669 Obesity, unspecified: Secondary | ICD-10-CM | POA: Diagnosis not present

## 2012-05-07 DIAGNOSIS — R5381 Other malaise: Secondary | ICD-10-CM | POA: Diagnosis not present

## 2012-05-07 DIAGNOSIS — T8189XA Other complications of procedures, not elsewhere classified, initial encounter: Secondary | ICD-10-CM | POA: Diagnosis not present

## 2012-05-07 DIAGNOSIS — R32 Unspecified urinary incontinence: Secondary | ICD-10-CM | POA: Diagnosis not present

## 2012-05-07 DIAGNOSIS — G8928 Other chronic postprocedural pain: Secondary | ICD-10-CM | POA: Diagnosis not present

## 2012-05-08 DIAGNOSIS — G8928 Other chronic postprocedural pain: Secondary | ICD-10-CM | POA: Diagnosis not present

## 2012-05-08 DIAGNOSIS — I509 Heart failure, unspecified: Secondary | ICD-10-CM | POA: Diagnosis not present

## 2012-05-08 DIAGNOSIS — E119 Type 2 diabetes mellitus without complications: Secondary | ICD-10-CM | POA: Diagnosis not present

## 2012-05-08 DIAGNOSIS — J449 Chronic obstructive pulmonary disease, unspecified: Secondary | ICD-10-CM | POA: Diagnosis not present

## 2012-05-08 DIAGNOSIS — Z5189 Encounter for other specified aftercare: Secondary | ICD-10-CM | POA: Diagnosis not present

## 2012-05-08 DIAGNOSIS — F341 Dysthymic disorder: Secondary | ICD-10-CM | POA: Diagnosis not present

## 2012-05-09 DIAGNOSIS — I739 Peripheral vascular disease, unspecified: Secondary | ICD-10-CM | POA: Diagnosis not present

## 2012-05-09 DIAGNOSIS — I5032 Chronic diastolic (congestive) heart failure: Secondary | ICD-10-CM | POA: Diagnosis not present

## 2012-05-09 DIAGNOSIS — E119 Type 2 diabetes mellitus without complications: Secondary | ICD-10-CM | POA: Diagnosis not present

## 2012-05-09 DIAGNOSIS — G8928 Other chronic postprocedural pain: Secondary | ICD-10-CM | POA: Diagnosis not present

## 2012-05-09 DIAGNOSIS — E7800 Pure hypercholesterolemia, unspecified: Secondary | ICD-10-CM | POA: Diagnosis not present

## 2012-05-09 DIAGNOSIS — K219 Gastro-esophageal reflux disease without esophagitis: Secondary | ICD-10-CM | POA: Diagnosis not present

## 2012-05-09 DIAGNOSIS — I509 Heart failure, unspecified: Secondary | ICD-10-CM | POA: Diagnosis not present

## 2012-05-09 DIAGNOSIS — Z5189 Encounter for other specified aftercare: Secondary | ICD-10-CM | POA: Diagnosis not present

## 2012-05-09 DIAGNOSIS — F341 Dysthymic disorder: Secondary | ICD-10-CM | POA: Diagnosis not present

## 2012-05-09 DIAGNOSIS — E1165 Type 2 diabetes mellitus with hyperglycemia: Secondary | ICD-10-CM | POA: Diagnosis not present

## 2012-05-09 DIAGNOSIS — E669 Obesity, unspecified: Secondary | ICD-10-CM | POA: Diagnosis not present

## 2012-05-09 DIAGNOSIS — J449 Chronic obstructive pulmonary disease, unspecified: Secondary | ICD-10-CM | POA: Diagnosis not present

## 2012-05-09 DIAGNOSIS — E1129 Type 2 diabetes mellitus with other diabetic kidney complication: Secondary | ICD-10-CM | POA: Diagnosis not present

## 2012-05-11 DIAGNOSIS — G8928 Other chronic postprocedural pain: Secondary | ICD-10-CM | POA: Diagnosis not present

## 2012-05-11 DIAGNOSIS — F341 Dysthymic disorder: Secondary | ICD-10-CM | POA: Diagnosis not present

## 2012-05-11 DIAGNOSIS — J449 Chronic obstructive pulmonary disease, unspecified: Secondary | ICD-10-CM | POA: Diagnosis not present

## 2012-05-11 DIAGNOSIS — E119 Type 2 diabetes mellitus without complications: Secondary | ICD-10-CM | POA: Diagnosis not present

## 2012-05-11 DIAGNOSIS — Z5189 Encounter for other specified aftercare: Secondary | ICD-10-CM | POA: Diagnosis not present

## 2012-05-11 DIAGNOSIS — I509 Heart failure, unspecified: Secondary | ICD-10-CM | POA: Diagnosis not present

## 2012-05-13 NOTE — Progress Notes (Signed)
[facility]  NAME:  CHEMEKA, FILICE                ACCOUNT NO.:  [id number]  MEDICAL RECORD NO.:  [id number]      DATE OF BIRTH:  May 09, 1960  PHYSICIAN:  Wayland Denis, DO       VISIT DATE:  05/12/2012                                  OFFICE VISIT   The patient is a 52 year old female who is here for followup on her left groin ulcer that had a muscle flap placed.  She is doing extremely well. There is no sign of infection.  The muscle looks very healthy, it is granulating.  She did receive the VAC.  The VAC was placed, and we drained 40 mL of serous fluid from the left anterior thigh area and this is not unexpected because of the surgery she has had with the graft.  We will continue with the [facility] and we will see her back in a week.     Wayland Denis, DO     CS/MEDQ  D:  05/12/2012  T:  05/13/2012  Job:  [id number]

## 2012-05-14 DIAGNOSIS — F341 Dysthymic disorder: Secondary | ICD-10-CM | POA: Diagnosis not present

## 2012-05-14 DIAGNOSIS — G8928 Other chronic postprocedural pain: Secondary | ICD-10-CM | POA: Diagnosis not present

## 2012-05-14 DIAGNOSIS — J449 Chronic obstructive pulmonary disease, unspecified: Secondary | ICD-10-CM | POA: Diagnosis not present

## 2012-05-14 DIAGNOSIS — E119 Type 2 diabetes mellitus without complications: Secondary | ICD-10-CM | POA: Diagnosis not present

## 2012-05-14 DIAGNOSIS — I509 Heart failure, unspecified: Secondary | ICD-10-CM | POA: Diagnosis not present

## 2012-05-14 DIAGNOSIS — Z5189 Encounter for other specified aftercare: Secondary | ICD-10-CM | POA: Diagnosis not present

## 2012-05-15 ENCOUNTER — Encounter: Payer: Self-pay | Admitting: Surgery

## 2012-05-15 DIAGNOSIS — F341 Dysthymic disorder: Secondary | ICD-10-CM | POA: Diagnosis not present

## 2012-05-15 DIAGNOSIS — E119 Type 2 diabetes mellitus without complications: Secondary | ICD-10-CM | POA: Diagnosis not present

## 2012-05-15 DIAGNOSIS — Z5189 Encounter for other specified aftercare: Secondary | ICD-10-CM | POA: Diagnosis not present

## 2012-05-15 DIAGNOSIS — I509 Heart failure, unspecified: Secondary | ICD-10-CM | POA: Diagnosis not present

## 2012-05-15 DIAGNOSIS — G8928 Other chronic postprocedural pain: Secondary | ICD-10-CM | POA: Diagnosis not present

## 2012-05-15 DIAGNOSIS — J449 Chronic obstructive pulmonary disease, unspecified: Secondary | ICD-10-CM | POA: Diagnosis not present

## 2012-05-16 DIAGNOSIS — F341 Dysthymic disorder: Secondary | ICD-10-CM | POA: Diagnosis not present

## 2012-05-16 DIAGNOSIS — E119 Type 2 diabetes mellitus without complications: Secondary | ICD-10-CM | POA: Diagnosis not present

## 2012-05-16 DIAGNOSIS — G8928 Other chronic postprocedural pain: Secondary | ICD-10-CM | POA: Diagnosis not present

## 2012-05-16 DIAGNOSIS — J449 Chronic obstructive pulmonary disease, unspecified: Secondary | ICD-10-CM | POA: Diagnosis not present

## 2012-05-16 DIAGNOSIS — Z5189 Encounter for other specified aftercare: Secondary | ICD-10-CM | POA: Diagnosis not present

## 2012-05-16 DIAGNOSIS — I509 Heart failure, unspecified: Secondary | ICD-10-CM | POA: Diagnosis not present

## 2012-05-19 ENCOUNTER — Ambulatory Visit (INDEPENDENT_AMBULATORY_CARE_PROVIDER_SITE_OTHER): Payer: Medicare Other | Admitting: Surgery

## 2012-05-19 ENCOUNTER — Encounter (INDEPENDENT_AMBULATORY_CARE_PROVIDER_SITE_OTHER): Payer: Medicare Other | Admitting: *Deleted

## 2012-05-19 ENCOUNTER — Ambulatory Visit: Payer: Medicare Other | Admitting: Surgery

## 2012-05-19 ENCOUNTER — Encounter: Payer: Self-pay | Admitting: Surgery

## 2012-05-19 VITALS — BP 150/74 | HR 83 | Ht 67.0 in | Wt 276.0 lb

## 2012-05-19 DIAGNOSIS — I739 Peripheral vascular disease, unspecified: Secondary | ICD-10-CM | POA: Diagnosis not present

## 2012-05-19 DIAGNOSIS — Z4889 Encounter for other specified surgical aftercare: Secondary | ICD-10-CM

## 2012-05-19 DIAGNOSIS — I723 Aneurysm of iliac artery: Secondary | ICD-10-CM

## 2012-05-19 NOTE — Addendum Note (Signed)
Addended by: Adria Dill L on: 05/19/2012 02:35 PM   Modules accepted: Orders

## 2012-05-19 NOTE — Progress Notes (Signed)
Patient comes in for followup. She underwent redo left femoral endarterectomy for acute occlusion of her previous femoral endarterectomy and patch angioplasty. She had wound breakdown with her initial operation secondary to her body habitus. Dr. Kelly Splinter performed a muscle flap. She currently has a wound VAC in place. The wound VAC was placed this morning so I did not remove it. Dr. Otilio Saber pictures of the wound. It appears to be granulating in nicely. She has no lower extremity complaints.  ABI was checked today. The left is 1.01. The right is 0.99  From my perspective the patient is doing very well. She will continue to get her wound care. I'll have her back in 3 months for a formal duplex ultrasound. Hopefully at that time, her wound will have completely healed

## 2012-05-20 NOTE — Progress Notes (Signed)
[facility]  NAME:  Stephanie Peck, Stephanie Peck                     ACCOUNT NO.:  MEDICAL RECORD NO.:  [id number]      DATE OF BIRTH:  November 16, 1960  PHYSICIAN:  Wayland Denis, DO       VISIT DATE:  05/19/2012                                  OFFICE VISIT   HISTORY:  The patient is a 52 year old female, who is here for followup on her left groin postsurgical ulcer.  She is doing extremely well and is granulating and starting to show signs of epithelialization from the side.  She had a little bit of a fluid collection.  A 20 mL was aspirated from the anterior thigh area.  She is not showing any signs of infection.  The fluid is decreasing.  We will continue with the VAC, we put in collagen underneath and will see her back in a week.     Wayland Denis, DO     CS/MEDQ  D:  05/19/2012  T:  05/19/2012  Job:  [id number]

## 2012-05-21 DIAGNOSIS — G8928 Other chronic postprocedural pain: Secondary | ICD-10-CM | POA: Diagnosis not present

## 2012-05-21 DIAGNOSIS — J449 Chronic obstructive pulmonary disease, unspecified: Secondary | ICD-10-CM | POA: Diagnosis not present

## 2012-05-21 DIAGNOSIS — Z5189 Encounter for other specified aftercare: Secondary | ICD-10-CM | POA: Diagnosis not present

## 2012-05-21 DIAGNOSIS — F341 Dysthymic disorder: Secondary | ICD-10-CM | POA: Diagnosis not present

## 2012-05-21 DIAGNOSIS — E119 Type 2 diabetes mellitus without complications: Secondary | ICD-10-CM | POA: Diagnosis not present

## 2012-05-21 DIAGNOSIS — I509 Heart failure, unspecified: Secondary | ICD-10-CM | POA: Diagnosis not present

## 2012-05-22 DIAGNOSIS — G8928 Other chronic postprocedural pain: Secondary | ICD-10-CM | POA: Diagnosis not present

## 2012-05-22 DIAGNOSIS — J449 Chronic obstructive pulmonary disease, unspecified: Secondary | ICD-10-CM | POA: Diagnosis not present

## 2012-05-22 DIAGNOSIS — I509 Heart failure, unspecified: Secondary | ICD-10-CM | POA: Diagnosis not present

## 2012-05-22 DIAGNOSIS — F341 Dysthymic disorder: Secondary | ICD-10-CM | POA: Diagnosis not present

## 2012-05-22 DIAGNOSIS — E119 Type 2 diabetes mellitus without complications: Secondary | ICD-10-CM | POA: Diagnosis not present

## 2012-05-22 DIAGNOSIS — Z5189 Encounter for other specified aftercare: Secondary | ICD-10-CM | POA: Diagnosis not present

## 2012-05-23 DIAGNOSIS — J449 Chronic obstructive pulmonary disease, unspecified: Secondary | ICD-10-CM | POA: Diagnosis not present

## 2012-05-23 DIAGNOSIS — F341 Dysthymic disorder: Secondary | ICD-10-CM | POA: Diagnosis not present

## 2012-05-23 DIAGNOSIS — G8928 Other chronic postprocedural pain: Secondary | ICD-10-CM | POA: Diagnosis not present

## 2012-05-23 DIAGNOSIS — I509 Heart failure, unspecified: Secondary | ICD-10-CM | POA: Diagnosis not present

## 2012-05-23 DIAGNOSIS — E119 Type 2 diabetes mellitus without complications: Secondary | ICD-10-CM | POA: Diagnosis not present

## 2012-05-23 DIAGNOSIS — Z5189 Encounter for other specified aftercare: Secondary | ICD-10-CM | POA: Diagnosis not present

## 2012-05-27 DIAGNOSIS — G8928 Other chronic postprocedural pain: Secondary | ICD-10-CM | POA: Diagnosis not present

## 2012-05-27 DIAGNOSIS — I509 Heart failure, unspecified: Secondary | ICD-10-CM | POA: Diagnosis not present

## 2012-05-27 DIAGNOSIS — F341 Dysthymic disorder: Secondary | ICD-10-CM | POA: Diagnosis not present

## 2012-05-27 DIAGNOSIS — J449 Chronic obstructive pulmonary disease, unspecified: Secondary | ICD-10-CM | POA: Diagnosis not present

## 2012-05-27 DIAGNOSIS — E119 Type 2 diabetes mellitus without complications: Secondary | ICD-10-CM | POA: Diagnosis not present

## 2012-05-27 DIAGNOSIS — Z5189 Encounter for other specified aftercare: Secondary | ICD-10-CM | POA: Diagnosis not present

## 2012-05-28 DIAGNOSIS — E119 Type 2 diabetes mellitus without complications: Secondary | ICD-10-CM | POA: Diagnosis not present

## 2012-05-28 DIAGNOSIS — J449 Chronic obstructive pulmonary disease, unspecified: Secondary | ICD-10-CM | POA: Diagnosis not present

## 2012-05-28 DIAGNOSIS — I509 Heart failure, unspecified: Secondary | ICD-10-CM | POA: Diagnosis not present

## 2012-05-28 DIAGNOSIS — G8928 Other chronic postprocedural pain: Secondary | ICD-10-CM | POA: Diagnosis not present

## 2012-05-28 DIAGNOSIS — Z5189 Encounter for other specified aftercare: Secondary | ICD-10-CM | POA: Diagnosis not present

## 2012-05-28 DIAGNOSIS — F341 Dysthymic disorder: Secondary | ICD-10-CM | POA: Diagnosis not present

## 2012-05-29 DIAGNOSIS — G8928 Other chronic postprocedural pain: Secondary | ICD-10-CM | POA: Diagnosis not present

## 2012-05-29 DIAGNOSIS — F341 Dysthymic disorder: Secondary | ICD-10-CM | POA: Diagnosis not present

## 2012-05-29 DIAGNOSIS — Z5189 Encounter for other specified aftercare: Secondary | ICD-10-CM | POA: Diagnosis not present

## 2012-05-29 DIAGNOSIS — J449 Chronic obstructive pulmonary disease, unspecified: Secondary | ICD-10-CM | POA: Diagnosis not present

## 2012-05-29 DIAGNOSIS — E119 Type 2 diabetes mellitus without complications: Secondary | ICD-10-CM | POA: Diagnosis not present

## 2012-05-29 DIAGNOSIS — I509 Heart failure, unspecified: Secondary | ICD-10-CM | POA: Diagnosis not present

## 2012-05-30 DIAGNOSIS — G8928 Other chronic postprocedural pain: Secondary | ICD-10-CM | POA: Diagnosis not present

## 2012-05-30 DIAGNOSIS — E119 Type 2 diabetes mellitus without complications: Secondary | ICD-10-CM | POA: Diagnosis not present

## 2012-05-30 DIAGNOSIS — J449 Chronic obstructive pulmonary disease, unspecified: Secondary | ICD-10-CM | POA: Diagnosis not present

## 2012-05-30 DIAGNOSIS — Z5189 Encounter for other specified aftercare: Secondary | ICD-10-CM | POA: Diagnosis not present

## 2012-05-30 DIAGNOSIS — I509 Heart failure, unspecified: Secondary | ICD-10-CM | POA: Diagnosis not present

## 2012-05-30 DIAGNOSIS — F341 Dysthymic disorder: Secondary | ICD-10-CM | POA: Diagnosis not present

## 2012-06-02 ENCOUNTER — Encounter (HOSPITAL_BASED_OUTPATIENT_CLINIC_OR_DEPARTMENT_OTHER): Payer: Medicare Other | Attending: Plastic Surgery

## 2012-06-02 DIAGNOSIS — L98499 Non-pressure chronic ulcer of skin of other sites with unspecified severity: Secondary | ICD-10-CM | POA: Diagnosis not present

## 2012-06-02 NOTE — Progress Notes (Signed)
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  She is doing extremely well.  She is filling in and the muscle is now granulating, and she looks extremely good, and she is continuing with the VAC.  There is no sign of infection.  She has a little bit of fluid, but we were not able to drain it on the lower leg near the superior incision.  She will continue with the [facility], and we will see her back in a week.     Wayland Denis, DO     CS/MEDQ  D:  06/02/2012  T:  06/02/2012  Job:  [id number]

## 2012-06-03 DIAGNOSIS — R609 Edema, unspecified: Secondary | ICD-10-CM | POA: Diagnosis not present

## 2012-06-03 DIAGNOSIS — F341 Dysthymic disorder: Secondary | ICD-10-CM | POA: Diagnosis not present

## 2012-06-03 DIAGNOSIS — R51 Headache: Secondary | ICD-10-CM | POA: Diagnosis not present

## 2012-06-03 DIAGNOSIS — Z5189 Encounter for other specified aftercare: Secondary | ICD-10-CM | POA: Diagnosis not present

## 2012-06-03 DIAGNOSIS — I509 Heart failure, unspecified: Secondary | ICD-10-CM | POA: Diagnosis not present

## 2012-06-03 DIAGNOSIS — G8928 Other chronic postprocedural pain: Secondary | ICD-10-CM | POA: Diagnosis not present

## 2012-06-03 DIAGNOSIS — J449 Chronic obstructive pulmonary disease, unspecified: Secondary | ICD-10-CM | POA: Diagnosis not present

## 2012-06-03 DIAGNOSIS — E119 Type 2 diabetes mellitus without complications: Secondary | ICD-10-CM | POA: Diagnosis not present

## 2012-06-04 ENCOUNTER — Other Ambulatory Visit: Payer: Self-pay | Admitting: Surgery

## 2012-06-04 DIAGNOSIS — E119 Type 2 diabetes mellitus without complications: Secondary | ICD-10-CM | POA: Diagnosis not present

## 2012-06-04 DIAGNOSIS — F341 Dysthymic disorder: Secondary | ICD-10-CM | POA: Diagnosis not present

## 2012-06-04 DIAGNOSIS — Z5189 Encounter for other specified aftercare: Secondary | ICD-10-CM | POA: Diagnosis not present

## 2012-06-04 DIAGNOSIS — G8928 Other chronic postprocedural pain: Secondary | ICD-10-CM | POA: Diagnosis not present

## 2012-06-04 DIAGNOSIS — J449 Chronic obstructive pulmonary disease, unspecified: Secondary | ICD-10-CM | POA: Diagnosis not present

## 2012-06-04 DIAGNOSIS — I509 Heart failure, unspecified: Secondary | ICD-10-CM | POA: Diagnosis not present

## 2012-06-06 DIAGNOSIS — F341 Dysthymic disorder: Secondary | ICD-10-CM | POA: Diagnosis not present

## 2012-06-06 DIAGNOSIS — E119 Type 2 diabetes mellitus without complications: Secondary | ICD-10-CM | POA: Diagnosis not present

## 2012-06-06 DIAGNOSIS — Z5189 Encounter for other specified aftercare: Secondary | ICD-10-CM | POA: Diagnosis not present

## 2012-06-06 DIAGNOSIS — I509 Heart failure, unspecified: Secondary | ICD-10-CM | POA: Diagnosis not present

## 2012-06-06 DIAGNOSIS — G8928 Other chronic postprocedural pain: Secondary | ICD-10-CM | POA: Diagnosis not present

## 2012-06-06 DIAGNOSIS — J449 Chronic obstructive pulmonary disease, unspecified: Secondary | ICD-10-CM | POA: Diagnosis not present

## 2012-06-09 ENCOUNTER — Other Ambulatory Visit: Payer: Self-pay | Admitting: Surgery

## 2012-06-09 ENCOUNTER — Telehealth: Payer: Self-pay | Admitting: *Deleted

## 2012-06-09 NOTE — Telephone Encounter (Signed)
Beaulah Corin OT, from [facility] called requesting verbal order from Dr V. Wells Brabham to continue occupational therapy.  Orders were given to continue.

## 2012-06-10 DIAGNOSIS — E119 Type 2 diabetes mellitus without complications: Secondary | ICD-10-CM | POA: Diagnosis not present

## 2012-06-10 DIAGNOSIS — G8928 Other chronic postprocedural pain: Secondary | ICD-10-CM | POA: Diagnosis not present

## 2012-06-10 DIAGNOSIS — Z5189 Encounter for other specified aftercare: Secondary | ICD-10-CM | POA: Diagnosis not present

## 2012-06-10 DIAGNOSIS — J449 Chronic obstructive pulmonary disease, unspecified: Secondary | ICD-10-CM | POA: Diagnosis not present

## 2012-06-10 DIAGNOSIS — I509 Heart failure, unspecified: Secondary | ICD-10-CM | POA: Diagnosis not present

## 2012-06-10 DIAGNOSIS — F341 Dysthymic disorder: Secondary | ICD-10-CM | POA: Diagnosis not present

## 2012-06-10 NOTE — Progress Notes (Signed)
[facility]  NAMEXAVIERA, FLATEN                ACCOUNT NO.:  [id number]  MEDICAL RECORD NO.:  [id number]      DATE OF BIRTH:  [birth year]  PHYSICIAN:  Wayland Denis, DO       VISIT DATE:  06/09/2012                                  OFFICE VISIT   The patient is a 52 year old female who is here for followup on her left groin chronic ulcer after surgery.  She is doing extremely well and is filling in.  She is almost skin level.  There is no sign of infection. No sign of fluid retention in the upper leg area.  She is very pleased with the results.  She is granulating in very well.  We will continue with dressings, but we will hold off on the [facility] for a week as her periwound area is very irritated.  We will use Endoform and apply for Oasis to help close up the final portion of this wound.     Wayland Denis, DO     CS/MEDQ  D:  06/09/2012  T:  06/10/2012  Job:  [id number]

## 2012-06-11 DIAGNOSIS — I509 Heart failure, unspecified: Secondary | ICD-10-CM | POA: Diagnosis not present

## 2012-06-11 DIAGNOSIS — Z5189 Encounter for other specified aftercare: Secondary | ICD-10-CM | POA: Diagnosis not present

## 2012-06-11 DIAGNOSIS — F341 Dysthymic disorder: Secondary | ICD-10-CM | POA: Diagnosis not present

## 2012-06-11 DIAGNOSIS — E119 Type 2 diabetes mellitus without complications: Secondary | ICD-10-CM | POA: Diagnosis not present

## 2012-06-11 DIAGNOSIS — J449 Chronic obstructive pulmonary disease, unspecified: Secondary | ICD-10-CM | POA: Diagnosis not present

## 2012-06-11 DIAGNOSIS — G8928 Other chronic postprocedural pain: Secondary | ICD-10-CM | POA: Diagnosis not present

## 2012-06-13 DIAGNOSIS — J449 Chronic obstructive pulmonary disease, unspecified: Secondary | ICD-10-CM | POA: Diagnosis not present

## 2012-06-13 DIAGNOSIS — F341 Dysthymic disorder: Secondary | ICD-10-CM | POA: Diagnosis not present

## 2012-06-13 DIAGNOSIS — Z5189 Encounter for other specified aftercare: Secondary | ICD-10-CM | POA: Diagnosis not present

## 2012-06-13 DIAGNOSIS — G8928 Other chronic postprocedural pain: Secondary | ICD-10-CM | POA: Diagnosis not present

## 2012-06-13 DIAGNOSIS — I509 Heart failure, unspecified: Secondary | ICD-10-CM | POA: Diagnosis not present

## 2012-06-13 DIAGNOSIS — E119 Type 2 diabetes mellitus without complications: Secondary | ICD-10-CM | POA: Diagnosis not present

## 2012-06-16 NOTE — Progress Notes (Signed)
[facility]  Stephanie Peck, Stephanie Peck                ACCOUNT NO.:  [id number]  MEDICAL RECORD NO.:  [id number]      DATE OF BIRTH:  December 31, 1960  PHYSICIAN:  Wayland Denis, DO       VISIT DATE:  06/16/2012                                  OFFICE VISIT   The patient is a 52 year old female who is here for followup on her left groin ulcer from postsurgical.  She is doing extremely well.  She has completely filled in.  The area is now flushed with the surrounding skin.  It is granulating.  There is no sign of infection, and the surrounding area has essentially healed without scarring.  She is extremely pleased.  There is no pain, no fluid.  We will continue with Endoform and see her back in 2 weeks.     Wayland Denis, DO     CS/MEDQ  D:  06/16/2012  T:  06/16/2012  Job:  [id number]

## 2012-06-17 DIAGNOSIS — F341 Dysthymic disorder: Secondary | ICD-10-CM | POA: Diagnosis not present

## 2012-06-17 DIAGNOSIS — E119 Type 2 diabetes mellitus without complications: Secondary | ICD-10-CM | POA: Diagnosis not present

## 2012-06-17 DIAGNOSIS — I509 Heart failure, unspecified: Secondary | ICD-10-CM | POA: Diagnosis not present

## 2012-06-17 DIAGNOSIS — J449 Chronic obstructive pulmonary disease, unspecified: Secondary | ICD-10-CM | POA: Diagnosis not present

## 2012-06-17 DIAGNOSIS — Z5189 Encounter for other specified aftercare: Secondary | ICD-10-CM | POA: Diagnosis not present

## 2012-06-17 DIAGNOSIS — G8928 Other chronic postprocedural pain: Secondary | ICD-10-CM | POA: Diagnosis not present

## 2012-06-19 DIAGNOSIS — F341 Dysthymic disorder: Secondary | ICD-10-CM | POA: Diagnosis not present

## 2012-06-19 DIAGNOSIS — E669 Obesity, unspecified: Secondary | ICD-10-CM | POA: Diagnosis not present

## 2012-06-19 DIAGNOSIS — I5032 Chronic diastolic (congestive) heart failure: Secondary | ICD-10-CM | POA: Diagnosis not present

## 2012-06-19 DIAGNOSIS — E7800 Pure hypercholesterolemia, unspecified: Secondary | ICD-10-CM | POA: Diagnosis not present

## 2012-06-19 DIAGNOSIS — J449 Chronic obstructive pulmonary disease, unspecified: Secondary | ICD-10-CM | POA: Diagnosis not present

## 2012-06-19 DIAGNOSIS — E119 Type 2 diabetes mellitus without complications: Secondary | ICD-10-CM | POA: Diagnosis not present

## 2012-06-19 DIAGNOSIS — I509 Heart failure, unspecified: Secondary | ICD-10-CM | POA: Diagnosis not present

## 2012-06-19 DIAGNOSIS — I10 Essential (primary) hypertension: Secondary | ICD-10-CM | POA: Diagnosis not present

## 2012-06-19 DIAGNOSIS — E1159 Type 2 diabetes mellitus with other circulatory complications: Secondary | ICD-10-CM | POA: Diagnosis not present

## 2012-06-19 DIAGNOSIS — G8928 Other chronic postprocedural pain: Secondary | ICD-10-CM | POA: Diagnosis not present

## 2012-06-19 DIAGNOSIS — Z5189 Encounter for other specified aftercare: Secondary | ICD-10-CM | POA: Diagnosis not present

## 2012-06-20 DIAGNOSIS — G8928 Other chronic postprocedural pain: Secondary | ICD-10-CM | POA: Diagnosis not present

## 2012-06-20 DIAGNOSIS — J449 Chronic obstructive pulmonary disease, unspecified: Secondary | ICD-10-CM | POA: Diagnosis not present

## 2012-06-20 DIAGNOSIS — F341 Dysthymic disorder: Secondary | ICD-10-CM | POA: Diagnosis not present

## 2012-06-20 DIAGNOSIS — E119 Type 2 diabetes mellitus without complications: Secondary | ICD-10-CM | POA: Diagnosis not present

## 2012-06-20 DIAGNOSIS — I509 Heart failure, unspecified: Secondary | ICD-10-CM | POA: Diagnosis not present

## 2012-06-20 DIAGNOSIS — Z5189 Encounter for other specified aftercare: Secondary | ICD-10-CM | POA: Diagnosis not present

## 2012-06-25 DIAGNOSIS — E119 Type 2 diabetes mellitus without complications: Secondary | ICD-10-CM | POA: Diagnosis not present

## 2012-06-25 DIAGNOSIS — Z5189 Encounter for other specified aftercare: Secondary | ICD-10-CM | POA: Diagnosis not present

## 2012-06-25 DIAGNOSIS — G8928 Other chronic postprocedural pain: Secondary | ICD-10-CM | POA: Diagnosis not present

## 2012-06-25 DIAGNOSIS — J449 Chronic obstructive pulmonary disease, unspecified: Secondary | ICD-10-CM | POA: Diagnosis not present

## 2012-06-25 DIAGNOSIS — I509 Heart failure, unspecified: Secondary | ICD-10-CM | POA: Diagnosis not present

## 2012-06-25 DIAGNOSIS — F341 Dysthymic disorder: Secondary | ICD-10-CM | POA: Diagnosis not present

## 2012-06-26 DIAGNOSIS — J449 Chronic obstructive pulmonary disease, unspecified: Secondary | ICD-10-CM | POA: Diagnosis not present

## 2012-06-26 DIAGNOSIS — F341 Dysthymic disorder: Secondary | ICD-10-CM | POA: Diagnosis not present

## 2012-06-26 DIAGNOSIS — G8928 Other chronic postprocedural pain: Secondary | ICD-10-CM | POA: Diagnosis not present

## 2012-06-26 DIAGNOSIS — E119 Type 2 diabetes mellitus without complications: Secondary | ICD-10-CM | POA: Diagnosis not present

## 2012-06-26 DIAGNOSIS — Z5189 Encounter for other specified aftercare: Secondary | ICD-10-CM | POA: Diagnosis not present

## 2012-06-26 DIAGNOSIS — I509 Heart failure, unspecified: Secondary | ICD-10-CM | POA: Diagnosis not present

## 2012-06-27 DIAGNOSIS — I509 Heart failure, unspecified: Secondary | ICD-10-CM | POA: Diagnosis not present

## 2012-06-27 DIAGNOSIS — F341 Dysthymic disorder: Secondary | ICD-10-CM | POA: Diagnosis not present

## 2012-06-27 DIAGNOSIS — G8928 Other chronic postprocedural pain: Secondary | ICD-10-CM | POA: Diagnosis not present

## 2012-06-27 DIAGNOSIS — E119 Type 2 diabetes mellitus without complications: Secondary | ICD-10-CM | POA: Diagnosis not present

## 2012-06-27 DIAGNOSIS — Z5189 Encounter for other specified aftercare: Secondary | ICD-10-CM | POA: Diagnosis not present

## 2012-06-27 DIAGNOSIS — J449 Chronic obstructive pulmonary disease, unspecified: Secondary | ICD-10-CM | POA: Diagnosis not present

## 2012-06-30 ENCOUNTER — Encounter (HOSPITAL_BASED_OUTPATIENT_CLINIC_OR_DEPARTMENT_OTHER): Payer: Medicare Other | Attending: Plastic Surgery

## 2012-06-30 DIAGNOSIS — R609 Edema, unspecified: Secondary | ICD-10-CM | POA: Diagnosis not present

## 2012-06-30 DIAGNOSIS — I739 Peripheral vascular disease, unspecified: Secondary | ICD-10-CM | POA: Diagnosis not present

## 2012-06-30 DIAGNOSIS — I10 Essential (primary) hypertension: Secondary | ICD-10-CM | POA: Diagnosis not present

## 2012-06-30 DIAGNOSIS — Y835 Amputation of limb(s) as the cause of abnormal reaction of the patient, or of later complication, without mention of misadventure at the time of the procedure: Secondary | ICD-10-CM | POA: Insufficient documentation

## 2012-06-30 DIAGNOSIS — T8189XA Other complications of procedures, not elsewhere classified, initial encounter: Secondary | ICD-10-CM | POA: Diagnosis not present

## 2012-06-30 DIAGNOSIS — E1129 Type 2 diabetes mellitus with other diabetic kidney complication: Secondary | ICD-10-CM | POA: Diagnosis not present

## 2012-06-30 NOTE — Progress Notes (Signed)
[facility]  NAMEDARIANNA, AMY                ACCOUNT NO.:  [id number]  MEDICAL RECORD NO.:  [id number]      DATE OF BIRTH:  [birth year]  PHYSICIAN:  Wayland Denis, DO       VISIT DATE:  06/30/2012                                  OFFICE VISIT   The patient is a 52 year old female who is here for left groin ulcer after a failed flap.  She is extremely happy with her results.  She has completely filled in.  She has got a very small area of superficial epithelialization, but no sign of infection.  No fluid retention.  At this point, we will continue with the Endoform and have her follow up, but she is completely out of the danger zone and doing extremely well.     Wayland Denis, DO     CS/MEDQ  D:  06/30/2012  T:  06/30/2012  Job:  [id number]

## 2012-07-01 DIAGNOSIS — J449 Chronic obstructive pulmonary disease, unspecified: Secondary | ICD-10-CM | POA: Diagnosis not present

## 2012-07-01 DIAGNOSIS — Z5189 Encounter for other specified aftercare: Secondary | ICD-10-CM | POA: Diagnosis not present

## 2012-07-01 DIAGNOSIS — G8928 Other chronic postprocedural pain: Secondary | ICD-10-CM | POA: Diagnosis not present

## 2012-07-01 DIAGNOSIS — F341 Dysthymic disorder: Secondary | ICD-10-CM | POA: Diagnosis not present

## 2012-07-01 DIAGNOSIS — E119 Type 2 diabetes mellitus without complications: Secondary | ICD-10-CM | POA: Diagnosis not present

## 2012-07-01 DIAGNOSIS — I509 Heart failure, unspecified: Secondary | ICD-10-CM | POA: Diagnosis not present

## 2012-07-02 DIAGNOSIS — I509 Heart failure, unspecified: Secondary | ICD-10-CM | POA: Diagnosis not present

## 2012-07-02 DIAGNOSIS — J449 Chronic obstructive pulmonary disease, unspecified: Secondary | ICD-10-CM | POA: Diagnosis not present

## 2012-07-02 DIAGNOSIS — F341 Dysthymic disorder: Secondary | ICD-10-CM | POA: Diagnosis not present

## 2012-07-02 DIAGNOSIS — G8928 Other chronic postprocedural pain: Secondary | ICD-10-CM | POA: Diagnosis not present

## 2012-07-02 DIAGNOSIS — Z5189 Encounter for other specified aftercare: Secondary | ICD-10-CM | POA: Diagnosis not present

## 2012-07-02 DIAGNOSIS — E119 Type 2 diabetes mellitus without complications: Secondary | ICD-10-CM | POA: Diagnosis not present

## 2012-07-03 DIAGNOSIS — J449 Chronic obstructive pulmonary disease, unspecified: Secondary | ICD-10-CM | POA: Diagnosis not present

## 2012-07-03 DIAGNOSIS — E119 Type 2 diabetes mellitus without complications: Secondary | ICD-10-CM | POA: Diagnosis not present

## 2012-07-03 DIAGNOSIS — Z5189 Encounter for other specified aftercare: Secondary | ICD-10-CM | POA: Diagnosis not present

## 2012-07-03 DIAGNOSIS — G8928 Other chronic postprocedural pain: Secondary | ICD-10-CM | POA: Diagnosis not present

## 2012-07-03 DIAGNOSIS — F341 Dysthymic disorder: Secondary | ICD-10-CM | POA: Diagnosis not present

## 2012-07-03 DIAGNOSIS — I509 Heart failure, unspecified: Secondary | ICD-10-CM | POA: Diagnosis not present

## 2012-07-17 DIAGNOSIS — I10 Essential (primary) hypertension: Secondary | ICD-10-CM | POA: Diagnosis not present

## 2012-07-17 DIAGNOSIS — I129 Hypertensive chronic kidney disease with stage 1 through stage 4 chronic kidney disease, or unspecified chronic kidney disease: Secondary | ICD-10-CM | POA: Diagnosis not present

## 2012-07-17 DIAGNOSIS — N2581 Secondary hyperparathyroidism of renal origin: Secondary | ICD-10-CM | POA: Diagnosis not present

## 2012-07-17 DIAGNOSIS — E119 Type 2 diabetes mellitus without complications: Secondary | ICD-10-CM | POA: Diagnosis not present

## 2012-07-22 ENCOUNTER — Other Ambulatory Visit (HOSPITAL_COMMUNITY)
Admission: RE | Admit: 2012-07-22 | Discharge: 2012-07-22 | Disposition: A | Discharge status: Home / Self Care | Payer: Medicare Other | Source: Ambulatory Visit | Attending: Obstetrics and Gynecology | Admitting: Obstetrics and Gynecology

## 2012-07-22 ENCOUNTER — Other Ambulatory Visit: Payer: Self-pay | Admitting: Nurse Practitioner

## 2012-07-22 ENCOUNTER — Other Ambulatory Visit: Payer: Self-pay | Admitting: Obstetrics and Gynecology

## 2012-07-22 DIAGNOSIS — Z124 Encounter for screening for malignant neoplasm of cervix: Secondary | ICD-10-CM | POA: Insufficient documentation

## 2012-07-22 DIAGNOSIS — N950 Postmenopausal bleeding: Secondary | ICD-10-CM | POA: Diagnosis not present

## 2012-07-22 DIAGNOSIS — Z1231 Encounter for screening mammogram for malignant neoplasm of breast: Secondary | ICD-10-CM

## 2012-07-22 DIAGNOSIS — Z01419 Encounter for gynecological examination (general) (routine) without abnormal findings: Secondary | ICD-10-CM | POA: Diagnosis not present

## 2012-08-06 ENCOUNTER — Ambulatory Visit
Admission: RE | Admit: 2012-08-06 | Discharge: 2012-08-06 | Disposition: A | Discharge status: Home / Self Care | Payer: Medicare Other | Source: Ambulatory Visit | Attending: Obstetrics and Gynecology | Admitting: Obstetrics and Gynecology

## 2012-08-06 DIAGNOSIS — Z1231 Encounter for screening mammogram for malignant neoplasm of breast: Secondary | ICD-10-CM | POA: Diagnosis not present

## 2012-08-22 ENCOUNTER — Encounter: Payer: Self-pay | Admitting: Surgery

## 2012-08-22 DIAGNOSIS — E1129 Type 2 diabetes mellitus with other diabetic kidney complication: Secondary | ICD-10-CM | POA: Diagnosis not present

## 2012-08-22 DIAGNOSIS — R079 Chest pain, unspecified: Secondary | ICD-10-CM | POA: Diagnosis not present

## 2012-08-22 DIAGNOSIS — M545 Low back pain: Secondary | ICD-10-CM | POA: Diagnosis not present

## 2012-08-22 DIAGNOSIS — R5383 Other fatigue: Secondary | ICD-10-CM | POA: Diagnosis not present

## 2012-08-25 ENCOUNTER — Encounter: Payer: Self-pay | Admitting: Surgery

## 2012-08-25 ENCOUNTER — Encounter (INDEPENDENT_AMBULATORY_CARE_PROVIDER_SITE_OTHER): Payer: Medicare Other | Admitting: *Deleted

## 2012-08-25 ENCOUNTER — Ambulatory Visit (INDEPENDENT_AMBULATORY_CARE_PROVIDER_SITE_OTHER): Payer: Medicare Other | Admitting: Surgery

## 2012-08-25 VITALS — BP 145/65 | HR 87 | Resp 16 | Ht 67.0 in | Wt 280.0 lb

## 2012-08-25 DIAGNOSIS — Z4889 Encounter for other specified surgical aftercare: Secondary | ICD-10-CM | POA: Diagnosis not present

## 2012-08-25 DIAGNOSIS — I739 Peripheral vascular disease, unspecified: Secondary | ICD-10-CM | POA: Diagnosis not present

## 2012-08-25 DIAGNOSIS — I729 Aneurysm of unspecified site: Secondary | ICD-10-CM

## 2012-08-25 DIAGNOSIS — I723 Aneurysm of iliac artery: Secondary | ICD-10-CM

## 2012-08-25 DIAGNOSIS — Z48812 Encounter for surgical aftercare following surgery on the circulatory system: Secondary | ICD-10-CM | POA: Diagnosis not present

## 2012-08-25 NOTE — Progress Notes (Signed)
[facility] [place]  Subjective  - She is very pleased the left femoral wound has now completely healed.  She is able to walk further without pain since her last surgery in March.  HPI: She underwent redo left femoral endarterectomy for acute occlusion of her previous femoral endarterectomy and patch angioplasty. She had wound breakdown with her initial operation secondary to her body habitus. Dr. Kelly Splinter performed a muscle flap. She currently has a wound VAC in place. The wound VAC was placed this morning so I did not remove it. Dr. Otilio Saber pictures of the wound.  Her primary care doctor has taken her off plavix due to acid reflux.     Objective 145/65 87   16 96% @IOBRIEF @  Left groin wound completely healed Distally DP bilateral palpable  ABI right 1.02 Left 1.03 08/25/2012  Assessment/Planning:  Redo left femoral endarterectomy for acute occlusion of her previous femoral endarterectomy and patch angioplasty.   We will ask her to take Asprin 325 mg daily. Since she has stopped the plavix.  Dr. Kelly Splinter performed a muscle flap. Follow up in 3 months for arterial duplex and ABI bilateral.      Clinton Gallant [facility] 08/25/2012 12:35 PM --  I agree with the above. The patient is doing very well. She is status post left femoral endarterectomy which subsequently occluded. She was taken back to the operating room. She had significant fatty tissue necrosis. Ultimately, Dr. Kelly Splinter performed a muscle rotation flap. The patient has been able to heal her wound completely. She actually looks very good. I have scheduled her to come back in 3 months with a repeat duplex ultrasound of both lower extremities to evaluate the stent in her right leg and the endarterectomy in the left groin.  Durene Cal

## 2012-08-26 NOTE — Addendum Note (Signed)
Addended by: Sharee Pimple on: 08/26/2012 09:15 AM   Modules accepted: Orders

## 2012-08-27 DIAGNOSIS — I5032 Chronic diastolic (congestive) heart failure: Secondary | ICD-10-CM | POA: Diagnosis not present

## 2012-08-27 DIAGNOSIS — E1165 Type 2 diabetes mellitus with hyperglycemia: Secondary | ICD-10-CM | POA: Diagnosis not present

## 2012-08-27 DIAGNOSIS — E1129 Type 2 diabetes mellitus with other diabetic kidney complication: Secondary | ICD-10-CM | POA: Diagnosis not present

## 2012-08-27 DIAGNOSIS — E1159 Type 2 diabetes mellitus with other circulatory complications: Secondary | ICD-10-CM | POA: Diagnosis not present

## 2012-08-27 DIAGNOSIS — IMO0002 Reserved for concepts with insufficient information to code with codable children: Secondary | ICD-10-CM | POA: Diagnosis not present

## 2012-08-27 DIAGNOSIS — I10 Essential (primary) hypertension: Secondary | ICD-10-CM | POA: Diagnosis not present

## 2012-09-02 ENCOUNTER — Other Ambulatory Visit: Payer: Self-pay | Admitting: *Deleted

## 2012-09-02 DIAGNOSIS — I739 Peripheral vascular disease, unspecified: Secondary | ICD-10-CM

## 2012-09-02 DIAGNOSIS — Z48812 Encounter for surgical aftercare following surgery on the circulatory system: Secondary | ICD-10-CM

## 2012-10-13 DIAGNOSIS — I129 Hypertensive chronic kidney disease with stage 1 through stage 4 chronic kidney disease, or unspecified chronic kidney disease: Secondary | ICD-10-CM | POA: Diagnosis not present

## 2012-10-13 DIAGNOSIS — N182 Chronic kidney disease, stage 2 (mild): Secondary | ICD-10-CM | POA: Diagnosis not present

## 2012-10-13 DIAGNOSIS — E119 Type 2 diabetes mellitus without complications: Secondary | ICD-10-CM | POA: Diagnosis not present

## 2012-10-15 ENCOUNTER — Encounter: Payer: Self-pay | Admitting: Nephrology

## 2012-10-21 DIAGNOSIS — E1149 Type 2 diabetes mellitus with other diabetic neurological complication: Secondary | ICD-10-CM | POA: Diagnosis not present

## 2012-10-21 DIAGNOSIS — R110 Nausea: Secondary | ICD-10-CM | POA: Diagnosis not present

## 2012-10-21 DIAGNOSIS — E1142 Type 2 diabetes mellitus with diabetic polyneuropathy: Secondary | ICD-10-CM | POA: Diagnosis not present

## 2012-10-21 DIAGNOSIS — E669 Obesity, unspecified: Secondary | ICD-10-CM | POA: Diagnosis not present

## 2012-10-22 ENCOUNTER — Encounter: Payer: Self-pay | Admitting: *Deleted

## 2012-10-22 ENCOUNTER — Encounter: Payer: Medicare Other | Attending: Internal Medicine | Admitting: *Deleted

## 2012-10-22 VITALS — Ht 67.0 in | Wt 286.3 lb

## 2012-10-22 DIAGNOSIS — E119 Type 2 diabetes mellitus without complications: Secondary | ICD-10-CM

## 2012-10-22 DIAGNOSIS — Z713 Dietary counseling and surveillance: Secondary | ICD-10-CM | POA: Diagnosis not present

## 2012-10-22 NOTE — Progress Notes (Signed)
Appt start time: 0900 end time:  1030.   Assessment:  Patient was seen on  10/22/12 for individual diabetes education. She was diagnosed with T2DM, CHF, Ventricular dysfunction 2004. Attended a class at time of diagnosis. Her take away message was to learn to listen to her body when it says it has had enough food.Marland KitchenMarland KitchenMarland KitchenMrs Baumbach come to [facility] for DSME r/t uncontrolled T2DM. Has had R&Y Gastric Bypass surgery in 2007. She is testing FBS, yet has recently been directed to test FBS  He has maintained 80-# eight loss. Prior to her surgery she was dependent on insulin. After surgery she was able to discontinue all DM medications. She is now on PO meds. Her present A1c if >9.0% and there is discussion of returning to insulin use.  Doesn't sleep well due to pain in back and knees. She notes regular bouts of nausea in the morning which aversely affects her ability to eat. Is interested in continued weight loss. Feels she has come to a plateau and is embarrassed to ask for help. I have reached out to Kasandra Knudsen. RD for assistance and consultation. The patient is motivated to move forward with weight loss which will significantly impact her glucose control. I would like to proceed with addressing bariatric needs in order to manage glucose and prevent need for insulin. Patient is eager to progress in this direction.  Current HbA1c: >9.0%  MEDICATIONS: See List...presently on glipizide 5mg  & Janument  50-1000....switching to glimeperide 4mg  qd & januvia 50mg  qd due to kidney function    DIETARY INTAKE:  Usual eating pattern includes 2 meals and  1 snacks per day.  24-hr recall:  B ( AM): coffee, sugar and coffee mate  Snk ( AM): none  L ( PM): (1:30pm) ham & cheese sandwich, corn chips, water  Snk ( PM): none D ( PM): ( 8:00pm) mixed bean soup with ham flavoring, 2 pieces corn bread Snk ( PM): none Beverages: coffee, sugar and coffee mate, water  Usual physical activity: Cooks one meal per day, laundry,  superficial house cleaning ( due to back and knee pain)   Nutritional Diagnosis:  NB-1.1 Food and nutrition-related knowledge deficit As related to elevated glucose.  As evidenced by A1c >9.0%.    Intervention:  Nutrition counseling provided.  Discussed diabetes disease process and treatment options.  Discussed physiology of diabetes and role of obesity on insulin resistance.  Encouraged moderate weight reduction to improve glucose levels.  Discussed role of medications and diet in glucose control  Provided education on macronutrients on glucose levels.  Provided education on carb counting, importance of regularly scheduled meals/snacks, and meal planning  Discussed effects of physical activity on glucose levels and long-term glucose control.  Recommended 150 minutes of physical activity/week.  Reviewed patient medications.  Discussed role of medication on blood glucose and possible side effects  Discussed blood glucose monitoring and interpretation.  Discussed recommended target ranges and individual ranges.    Described short-term complications: hyper- and hypo-glycemia.  Discussed causes,symptoms, and treatment options.  Discussed prevention, detection, and treatment of long-term complications.  Discussed the role of prolonged elevated glucose levels on body systems.  Discussed role of stress on blood glucose levels and discussed strategies to manage psychosocial issues.  Discussed recommendations for long-term diabetes self-care.  Established checklist for medical, dental, and emotional self-care.  PLAN: Follow pre-surgical bariatric diet Test glucose FBS and 2hpp dinner daily and log Contact Dr. Sharl Ma and advise plan r/t Bariatrics and would like to hold  off and starting insulin  Handouts given during visit include: Living Well with Diabetes Carb Counting  handouts Meal Plan Card  Pre-Surgical Bariatric Diet  Barriers to learning/adherance to lifestyle change: finances and  physical challenges/pain  Diabetes self-care support plan:   [facility] support group  Bariatric support group  Meeting with Kasandra Knudsen. RD  Monitoring/Evaluation:  Dietary intake, exercise, monitor glucose, and body weight, in 4 weeks with Huntley Dec Himmelrich RD for f/u r/t bariatrics.

## 2012-10-29 NOTE — Progress Notes (Signed)
Called in by Gilman Buttner to consult with patient during their visit. Pt is s/p RYGB and needed guidance on protein and vitamins. Gave samples and shaker bottle for protein shakes. Urged pt to return for f/u with Dr. Daphine Deutscher @ [facility] to reestablish care.   Unjury Protein Powder: 1 pkt ea Lot: [id number]; Exp: 11/15 Lot: [id number]; Exp: 12/15 Lot: [id number]; Exp: 12/15  PB2 (powdered peanut butter): 2 pkts Lot: NONE; Exp: 02/10/13

## 2012-11-05 DIAGNOSIS — F329 Major depressive disorder, single episode, unspecified: Secondary | ICD-10-CM | POA: Diagnosis not present

## 2012-11-05 DIAGNOSIS — E1129 Type 2 diabetes mellitus with other diabetic kidney complication: Secondary | ICD-10-CM | POA: Diagnosis not present

## 2012-11-05 DIAGNOSIS — G8929 Other chronic pain: Secondary | ICD-10-CM | POA: Diagnosis not present

## 2012-11-05 DIAGNOSIS — Z23 Encounter for immunization: Secondary | ICD-10-CM | POA: Diagnosis not present

## 2012-11-13 ENCOUNTER — Other Ambulatory Visit: Payer: Self-pay | Admitting: Surgery

## 2012-11-13 DIAGNOSIS — Z48812 Encounter for surgical aftercare following surgery on the circulatory system: Secondary | ICD-10-CM

## 2012-11-13 DIAGNOSIS — I739 Peripheral vascular disease, unspecified: Secondary | ICD-10-CM

## 2012-11-13 DIAGNOSIS — I729 Aneurysm of unspecified site: Secondary | ICD-10-CM

## 2012-11-19 ENCOUNTER — Ambulatory Visit: Payer: Medicare Other | Admitting: *Deleted

## 2012-11-28 ENCOUNTER — Encounter: Payer: Self-pay | Admitting: Surgery

## 2012-12-01 ENCOUNTER — Ambulatory Visit (INDEPENDENT_AMBULATORY_CARE_PROVIDER_SITE_OTHER)
Admission: RE | Admit: 2012-12-01 | Discharge: 2012-12-01 | Disposition: A | Discharge status: Home / Self Care | Payer: Medicare Other | Source: Ambulatory Visit | Attending: Surgery | Admitting: Surgery

## 2012-12-01 ENCOUNTER — Encounter: Payer: Self-pay | Admitting: *Deleted

## 2012-12-01 ENCOUNTER — Encounter: Payer: Self-pay | Admitting: Surgery

## 2012-12-01 ENCOUNTER — Ambulatory Visit (HOSPITAL_COMMUNITY)
Admission: RE | Admit: 2012-12-01 | Discharge: 2012-12-01 | Disposition: A | Discharge status: Home / Self Care | Payer: Medicare Other | Source: Ambulatory Visit | Attending: Surgery | Admitting: Surgery

## 2012-12-01 ENCOUNTER — Other Ambulatory Visit: Payer: Self-pay | Admitting: Surgery

## 2012-12-01 ENCOUNTER — Ambulatory Visit (INDEPENDENT_AMBULATORY_CARE_PROVIDER_SITE_OTHER): Payer: Medicare Other | Admitting: Surgery

## 2012-12-01 ENCOUNTER — Other Ambulatory Visit: Payer: Self-pay | Admitting: *Deleted

## 2012-12-01 VITALS — BP 138/71 | HR 90 | Ht 67.0 in | Wt 283.0 lb

## 2012-12-01 DIAGNOSIS — Z48812 Encounter for surgical aftercare following surgery on the circulatory system: Secondary | ICD-10-CM

## 2012-12-01 DIAGNOSIS — I729 Aneurysm of unspecified site: Secondary | ICD-10-CM

## 2012-12-01 DIAGNOSIS — I739 Peripheral vascular disease, unspecified: Secondary | ICD-10-CM | POA: Diagnosis not present

## 2012-12-01 NOTE — Progress Notes (Signed)
[facility] [place]   Patient name: Stephanie Peck MRN: [id number] DOB: 01/19/1961 Sex: female     Chief Complaint  Patient presents with  . Re-evaluation    3 month f/u     HISTORY OF PRESENT ILLNESS: The patient is here today for followup. On 04/07/2012 she underwent redo left femoral endarterectomy with patch angioplasty. I previously performed a left femoral endarterectomy and this has occluded. She came in with numbness to her left foot. Dr. Sanger provided a muscle flap for wound coverage. The patient's numbness resolved after her operation.  Her wound has now completely healed.  She does report one episode of calf cramping with activity.  She is taking a full dose aspirin as she could not tolerate Plavix.   Past Medical History  Diagnosis Date  . Diabetes mellitus   . GERD (gastroesophageal reflux disease)   . Anxiety   . CHF (congestive heart failure)   . COPD (chronic obstructive pulmonary disease)   . Sleep apnea     severe OSA by 09/08/06 sleep study ([facility])  . Depression   . Morbid obesity     Past Surgical History  Procedure Laterality Date  . Cholecystectomy    . Gastric bypass  2006  . Tonsillectomy    . Femoral artery stent  12/05/2011    right superficial   . Endarterectomy femoral  02/22/2012    Procedure: ENDARTERECTOMY FEMORAL;  Surgeon: Ranelle Auker W Ade Stmarie, MD;  Location: [facility] OR;  Service: Vascular;  Laterality: Left;  . Patch angioplasty  02/22/2012    Procedure: PATCH ANGIOPLASTY;  Surgeon: Redina Zeller W Michaelina Blandino, MD;  Location: [facility] OR;  Service: Vascular;  Laterality: Left;  left femoral patch angioplasty  . Application of wound vac  02/22/2012    Procedure: APPLICATION OF WOUND VAC;  Surgeon: Monquie Fulgham W Eliyanah Elgersma, MD;  Location: [facility] OR;  Service: Vascular;  Laterality: Left;  application wound vac  . Groin debridement  03/01/2012    Procedure: GROIN DEBRIDEMENT;  Surgeon: Todd F Early, MD;  Location: [facility] OR;  Service: Vascular;  Laterality: Left;  .  Aortogram Left 04/04/2012    Procedure: AORTOGRAM;  Surgeon: Julyana Woolverton W Zaylon Bossier, MD;  Location: [facility] OR;  Service: Vascular;  Laterality: Left;  . Patch angioplasty Left 04/04/2012    Procedure: PATCH ANGIOPLASTY;  Surgeon: Teddi Badalamenti W Aizlyn Schifano, MD;  Location: [facility] OR;  Service: Vascular;  Laterality: Left;  . Muscle flap closure Left 04/04/2012    Procedure: MUSCLE FLAP CLOSURE;  Surgeon: [name], DO;  Location: [facility] OR;  Service: Plastics;  Laterality: Left;    History   Social History  . Marital Status: Married    Spouse Name: N/A    Number of Children: N/A  . Years of Education: N/A   Occupational History  . Not on file.   Social History Main Topics  . Smoking status: Former Smoker -- 2.00 packs/day for 20 years    Types: Cigarettes    Start date: 09/30/2011    Quit date: 10/30/2011  . Smokeless tobacco: Never Used  . Alcohol Use: No  . Drug Use: No  . Sexual Activity: Not on file   Other Topics Concern  . Not on file   Social History Narrative  . No narrative on file    Family History  Problem Relation Age of Onset  . Cancer Mother   . Depression Mother   . Heart disease Father     before age 60  . Diabetes Father   .   Hypertension Father   . Hyperlipidemia Father   . Heart attack Father   . Cancer Maternal Grandmother     Allergies as of 12/01/2012 - Review Complete 12/01/2012  Allergen Reaction Noted  . Codeine Nausea Only     Current Outpatient Prescriptions on File Prior to Visit  Medication Sig Dispense Refill  . albuterol (PROVENTIL HFA;VENTOLIN HFA) 108 (90 BASE) MCG/ACT inhaler Inhale 2 puffs into the lungs every 6 (six) hours as needed. For wheezing and shortness of breath.      . albuterol (PROVENTIL) (5 MG/ML) 0.5% nebulizer solution Take 2.5 mg by nebulization every 6 (six) hours as needed. For wheezing and shortness of breath.      . buPROPion (WELLBUTRIN XL) 300 MG 24 hr tablet Take 300 mg by mouth daily.      . calcium carbonate (OS-CAL) 600 MG TABS  Take 600 mg by mouth 2 (two) times daily with a meal.      . CARAFATE 1 GM/10ML suspension 4 (four) times daily - after meals and at bedtime.      . Cyanocobalamin (VITAMIN B 12 PO) Take 250 mg by mouth daily.      . DULoxetine (CYMBALTA) 60 MG capsule Take 60 mg by mouth daily.      . furosemide (LASIX) 40 MG tablet Take 40 mg by mouth daily.      . gabapentin (NEURONTIN) 600 MG tablet Take 1 tablet (600 mg total) by mouth 2 (two) times daily.  60 tablet  2  . glipiZIDE (GLUCOTROL) 5 MG tablet Take 5 mg by mouth 2 (two) times daily before a meal.      . ipratropium-albuterol (DUONEB) 0.5-2.5 (3) MG/3ML SOLN continuous as needed.      . lansoprazole (PREVACID) 30 MG capsule Take 30 mg by mouth 2 (two) times daily.      . levofloxacin (LEVAQUIN) 500 MG tablet Take 1 tablet (500 mg total) by mouth daily.  10 tablet  0  . losartan (COZAAR) 25 MG tablet Take 1 tablet by mouth daily.      . Multiple Vitamin (MULTIVITAMIN) tablet Take 1 tablet by mouth daily.      . omeprazole (PRILOSEC) 40 MG capsule 2 (two) times daily.      . oxycodone (OXY-IR) 5 MG capsule Take 5 mg by mouth every 6 (six) hours as needed (5-325 prn). For pain      . promethazine (PHENERGAN) 25 MG tablet Take 25 mg by mouth every 6 (six) hours as needed. For nausea      . simvastatin (ZOCOR) 20 MG tablet Take 20 mg by mouth every evening.      . sitaGLIPtan-metformin (JANUMET) 50-1000 MG per tablet Take 1 tablet by mouth 2 (two) times daily with a meal.      . sulfamethoxazole-trimethoprim (BACTRIM DS) 800-160 MG per tablet       . SUMAtriptan (IMITREX) 25 MG tablet Take 25 mg by mouth as needed for migraine.      . topiramate (TOPAMAX) 50 MG tablet Take 50 mg by mouth 2 (two) times daily.      . clopidogrel (PLAVIX) 75 MG tablet TAKE 1 TABLET EVERY DAY  30 tablet  5  . dabigatran (PRADAXA) 150 MG CAPS Take 1 capsule (150 mg total) by mouth every 12 (twelve) hours.  60 capsule  4  . ferrous fumarate (HEMOCYTE - 106 MG FE) 325 (106  FE) MG TABS Take 1 tablet by mouth.      .   indomethacin (INDOCIN) 50 MG capsule        No current facility-administered medications on file prior to visit.     REVIEW OF SYSTEMS: No changes  PHYSICAL EXAMINATION:   Vital signs are BP 138/71  Pulse 90  Ht 5' 7" (1.702 m)  Wt 283 lb (128.368 kg)  BMI 44.31 kg/m2  SpO2 97% General: The patient appears their stated age. HEENT:  No gross abnormalities Pulmonary:  Non labored breathing Musculoskeletal: There are no major deformities. Neurologic: No focal weakness or paresthesias are detected, Skin: There are no ulcer or rashes noted. Psychiatric: The patient has normal affect. Cardiovascular: There is a regular rate and rhythm without significant murmur appreciated.   Diagnostic Studies Duplex ultrasound was ordered and reviewed.  The ABI on the left is 0.88 with triphasic waveforms.  On the right is 0.97 with triphasic waveforms.  Elevated velocities were identified in the proximal right superficial femoral artery with peak velocity of 652.  On the left there is also an area of elevated velocities, peak velocity is 350.  This is in the proximal superficial femoral artery.  There is also an area of elevation in the left external iliac artery with velocities of 2 34 cm/s  Assessment: Status post right leg stenting and left femoral endarterectomy x2 Plan: Based on the patient's duplex today, I feel that further evaluation is warranted.  Therefore, I have recommended proceeding with abdominal aortogram with bilateral lower extremity runoff via a right femoral approach.  My main focus will be on evaluating the previous repair in the left groin as well as the left external iliac artery.  At that time I also evaluate what appears to be did note both stenosis within the proximal right superficial femoral artery this procedure is been scheduled for Tuesday, November 11  V. Wells Ethan Kasperski IV, M.D. [facility]  [place] Office: [phone] Pager:  [phone]   

## 2012-12-03 ENCOUNTER — Encounter (HOSPITAL_COMMUNITY): Payer: Self-pay | Admitting: Pharmacy Technician

## 2012-12-04 ENCOUNTER — Other Ambulatory Visit: Payer: Self-pay

## 2012-12-08 ENCOUNTER — Telehealth: Payer: Self-pay

## 2012-12-08 ENCOUNTER — Other Ambulatory Visit: Payer: Self-pay | Admitting: *Deleted

## 2012-12-08 DIAGNOSIS — I739 Peripheral vascular disease, unspecified: Secondary | ICD-10-CM

## 2012-12-08 MED ORDER — SODIUM CHLORIDE 0.9 % IV SOLN
INTRAVENOUS | Status: DC
  Administered 2012-12-09: 09:00:00 via INTRAVENOUS

## 2012-12-08 NOTE — Telephone Encounter (Signed)
Phone call from pt.  Reports a "tear drop sized" skin tear in left groin, when she removed a bandaid on Friday, 11/7.   States there is a little redness and warmth; also reports sm. amt. of clear drainage.  Denies fever/ chills.  States that this is a chronic problem with having the tissue open-up in left groin.  Reports that the skin in the (L) groin is very thin.  States "if it gets too moist, or gets too dry, or if I rub it too hard, it opens up."  States has been able to keep it from worsening with antibacterial ointment and a dressing.  Advised to avoid using bandaids or any adhesive on the thin tissue.  States she has changed to using a clean, dry cloth.  Discussed w/ Dr. Myra Gianotti.  Advised to proceed with plan to do angiogram 11/11, and to refer back to Dr. Kelly Splinter for the wound (L) groin.  Advised pt.  She will be given an appt. to see Dr. Kelly Splinter for the open sore in left groin, and to proceed with the angiogram tomorrow.  Pt. Verb. understanding.

## 2012-12-09 ENCOUNTER — Encounter (HOSPITAL_COMMUNITY)
Admission: RE | Disposition: A | Discharge status: Home / Self Care | Payer: Self-pay | Source: Ambulatory Visit | Attending: Surgery

## 2012-12-09 ENCOUNTER — Telehealth: Payer: Self-pay | Admitting: Surgery

## 2012-12-09 ENCOUNTER — Other Ambulatory Visit: Payer: Self-pay | Admitting: *Deleted

## 2012-12-09 ENCOUNTER — Ambulatory Visit (HOSPITAL_COMMUNITY)
Admission: RE | Admit: 2012-12-09 | Discharge: 2012-12-09 | Disposition: A | Discharge status: Home / Self Care | Payer: Medicare Other | Source: Ambulatory Visit | Attending: Surgery | Admitting: Surgery

## 2012-12-09 DIAGNOSIS — Z7902 Long term (current) use of antithrombotics/antiplatelets: Secondary | ICD-10-CM | POA: Insufficient documentation

## 2012-12-09 DIAGNOSIS — I509 Heart failure, unspecified: Secondary | ICD-10-CM | POA: Diagnosis not present

## 2012-12-09 DIAGNOSIS — K219 Gastro-esophageal reflux disease without esophagitis: Secondary | ICD-10-CM | POA: Diagnosis not present

## 2012-12-09 DIAGNOSIS — J4489 Other specified chronic obstructive pulmonary disease: Secondary | ICD-10-CM | POA: Insufficient documentation

## 2012-12-09 DIAGNOSIS — I708 Atherosclerosis of other arteries: Secondary | ICD-10-CM | POA: Insufficient documentation

## 2012-12-09 DIAGNOSIS — I70209 Unspecified atherosclerosis of native arteries of extremities, unspecified extremity: Secondary | ICD-10-CM | POA: Diagnosis not present

## 2012-12-09 DIAGNOSIS — E119 Type 2 diabetes mellitus without complications: Secondary | ICD-10-CM | POA: Insufficient documentation

## 2012-12-09 DIAGNOSIS — Z6841 Body Mass Index (BMI) 40.0 and over, adult: Secondary | ICD-10-CM | POA: Diagnosis not present

## 2012-12-09 DIAGNOSIS — J449 Chronic obstructive pulmonary disease, unspecified: Secondary | ICD-10-CM | POA: Diagnosis not present

## 2012-12-09 DIAGNOSIS — G4733 Obstructive sleep apnea (adult) (pediatric): Secondary | ICD-10-CM | POA: Diagnosis not present

## 2012-12-09 DIAGNOSIS — I739 Peripheral vascular disease, unspecified: Secondary | ICD-10-CM

## 2012-12-09 DIAGNOSIS — F329 Major depressive disorder, single episode, unspecified: Secondary | ICD-10-CM | POA: Diagnosis not present

## 2012-12-09 DIAGNOSIS — Z48812 Encounter for surgical aftercare following surgery on the circulatory system: Secondary | ICD-10-CM

## 2012-12-09 DIAGNOSIS — F3289 Other specified depressive episodes: Secondary | ICD-10-CM | POA: Insufficient documentation

## 2012-12-09 HISTORY — PX: ABDOMINAL AORTAGRAM: SHX5454

## 2012-12-09 LAB — POCT I-STAT, CHEM 8
BUN: 15 mg/dL (ref 6–23)
Calcium, Ion: 1.2 mmol/L (ref 1.12–1.23)
Chloride: 107 mEq/L (ref 96–112)
Glucose, Bld: 302 mg/dL — ABNORMAL HIGH (ref 70–99)
Potassium: 3.8 mEq/L (ref 3.5–5.1)
Sodium: 139 mEq/L (ref 135–145)

## 2012-12-09 LAB — GLUCOSE, CAPILLARY: Glucose-Capillary: 197 mg/dL — ABNORMAL HIGH (ref 70–99)

## 2012-12-09 LAB — POCT ACTIVATED CLOTTING TIME: Activated Clotting Time: 176 seconds

## 2012-12-09 SURGERY — ABDOMINAL AORTAGRAM
Anesthesia: LOCAL

## 2012-12-09 MED ORDER — ONDANSETRON HCL 4 MG/2ML IJ SOLN: 4.0000 mg | Freq: Four times a day (QID) | INTRAMUSCULAR | Status: DC | PRN

## 2012-12-09 MED ORDER — LIDOCAINE HCL (PF) 1 % IJ SOLN
INTRAMUSCULAR | Status: AC
  Filled 2012-12-09: qty 30

## 2012-12-09 MED ORDER — ALUM & MAG HYDROXIDE-SIMETH 200-200-20 MG/5ML PO SUSP: 15.0000 mL | ORAL | Status: DC | PRN

## 2012-12-09 MED ORDER — ACETAMINOPHEN 325 MG PO TABS: 325.0000 mg | ORAL_TABLET | ORAL | Status: DC | PRN

## 2012-12-09 MED ORDER — HYDRALAZINE HCL 20 MG/ML IJ SOLN: 10.0000 mg | INTRAMUSCULAR | Status: DC | PRN

## 2012-12-09 MED ORDER — FENTANYL CITRATE 0.05 MG/ML IJ SOLN
INTRAMUSCULAR | Status: AC
  Filled 2012-12-09: qty 2

## 2012-12-09 MED ORDER — LABETALOL HCL 5 MG/ML IV SOLN: 10.0000 mg | INTRAVENOUS | Status: DC | PRN

## 2012-12-09 MED ORDER — ACETAMINOPHEN 325 MG RE SUPP: 325.0000 mg | RECTAL | Status: DC | PRN

## 2012-12-09 MED ORDER — PHENOL 1.4 % MT LIQD: 1.0000 | OROMUCOSAL | Status: DC | PRN

## 2012-12-09 MED ORDER — HEPARIN (PORCINE) IN NACL 2-0.9 UNIT/ML-% IJ SOLN
INTRAMUSCULAR | Status: AC
  Filled 2012-12-09: qty 1000

## 2012-12-09 MED ORDER — HEPARIN SODIUM (PORCINE) 1000 UNIT/ML IJ SOLN
INTRAMUSCULAR | Status: AC
  Filled 2012-12-09: qty 1

## 2012-12-09 MED ORDER — GUAIFENESIN-DM 100-10 MG/5ML PO SYRP: 15.0000 mL | ORAL_SOLUTION | ORAL | Status: DC | PRN

## 2012-12-09 MED ORDER — SODIUM CHLORIDE 0.9 % IV SOLN: INTRAVENOUS | Status: DC

## 2012-12-09 MED ORDER — METOPROLOL TARTRATE 1 MG/ML IV SOLN: 2.0000 mg | INTRAVENOUS | Status: DC | PRN

## 2012-12-09 MED ORDER — INSULIN ASPART 100 UNIT/ML ~~LOC~~ SOLN
10.0000 [IU] | Freq: Once | SUBCUTANEOUS | Status: AC
  Administered 2012-12-09: 10:00:00 via SUBCUTANEOUS
  Filled 2012-12-09: qty 0.1

## 2012-12-09 MED ORDER — MIDAZOLAM HCL 2 MG/2ML IJ SOLN
INTRAMUSCULAR | Status: AC
  Filled 2012-12-09: qty 2

## 2012-12-09 MED ORDER — INSULIN ASPART 100 UNIT/ML ~~LOC~~ SOLN
SUBCUTANEOUS | Status: AC
  Filled 2012-12-09: qty 1

## 2012-12-09 NOTE — Op Note (Signed)
[facility] [place]  Patient name: Stephanie Peck MRN: [id number] DOB: [birth year] Sex: female  12/09/2012 Pre-operative Diagnosis: Bypass graft stenosis, left Post-operative diagnosis:  Same Surgeon:  Jorge Ny Procedure Performed:  1.  ultrasound-guided access, right femoral artery  2.  abdominal aortogram  3.  left lower extremity runoff  4.  angioplasty, left superficial femoral artery with a drug-eluting balloon  5.  stent, left external iliac artery   Indications:  The patient has previously undergone left femoral endarterectomy with patch angioplasty.  This initially occluded at the same time she developed a significant groin wound complication.  Allen back and repeated the patch angioplasty as well as a thrombectomy of the external iliac and common femoral and superficial femoral arteries.  The patient had a simultaneous muscle flap to close her groin.  Her followup Doppler ultrasound recently revealed elevated velocities within the distal patch.  She comes in for further evaluation  Procedure:  The patient was identified in the holding area and taken to room 8.  The patient was then placed supine on the table and prepped and draped in the usual sterile fashion.  A time out was called.  Ultrasound was used to evaluate the right common femoral artery.  It was patent .  A digital ultrasound image was acquired.  A micropuncture needle was used to access the right common femoral artery under ultrasound guidance.  An 018 wire was advanced without resistance and a micropuncture sheath was placed.  The 018 wire was removed and a benson wire was placed.  The micropuncture sheath was exchanged for a 5 french sheath.  An omniflush catheter was advanced over the wire to the level of L-1.  An abdominal angiogram was obtained.  Next, using the omniflush catheter and a benson wire, the aortic bifurcation was crossed and the catheter was placed into theleft external iliac  artery and left runoff was obtained.   Findings:   Aortogram:  No significant suprarenal renal aortic stenosis is identified.  There are 2 renal arteries on the right which are widely patent.  The left renal artery is widely patent.  The infrarenal abdominal aorta is widely patent.  The right common external and internal iliac arteries are widely patent.  The left common and internal iliac arteries are widely patent.  There is luminal narrowing within the left external iliac artery.    Left Lower Extremity:  Patulous dilatation of the common femoral artery extending onto the proximal left superficial femoral artery is observed.  This is consistent with previous surgery.  Profunda femoris widely patent.  Mild luminal irregularity is seen within the adductor canal.  The popliteal artery is widely patent and there is three-vessel runoff   Intervention:  After the above images were acquired, the decision was made to proceed with intervention.  Over an 035 wire, a 6 [place] Ansel 1 sheath was advanced to the left external iliac artery.  The patient was fully heparinized.  A woolly wire was advanced across the stenosis into the distal superficial femoral artery.  I elected to predilated this lesion with a 4 x 40 balloon.  Next, a Lutonix 4 x 40 drug-eluting balloon was used to perform balloon and plasty within the 60% stenosis of the superficial femoral artery.  This correlated with what I thought was the distal end of the patch angioplasty.  The balloon was taken to nominal pressure and held up for 2 minutes.  Completion angiography revealed excellent result.  I then performed a pullback pressure is within the left external iliac artery.  There was a 40-50 mm gradient within the proximal external iliac artery.  I felt this needed to be stented.  This was previously an area that underwent thrombectomy.  I selected a 7 x 60 Cordis self-expanding stent.  This was deployed within the left external iliac artery it was  molded using a 7 mm balloon.  Completion angiography revealed resolution of the stenosis.  At this point the sheath was withdrawn to the right iliac artery and the wire and catheters were removed.  The patient was taken the holding area for sheath pull.  There were no complications.  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  This was treated using a drug-eluting balloon angioplasty measuring 4 x 40 in length.  #2  50 mm gradient within the left external iliac artery successfully treated using a 7 x 60 Cordis self-expanding stent   V. Durene Cal, M.D. [facility] [place] Office: [phone] Pager:  [phone]

## 2012-12-09 NOTE — Interval H&P Note (Signed)
History and Physical Interval Note:  12/09/2012 10:20 AM  Stephanie Peck  has presented today for surgery, with the diagnosis of pvd  The various methods of treatment have been discussed with the patient and family. After consideration of risks, benefits and other options for treatment, the patient has consented to  Procedure(s): ABDOMINAL AORTAGRAM (N/A) as a surgical intervention .  The patient's history has been reviewed, patient examined, no change in status, stable for surgery.  I have reviewed the patient's chart and labs.  Questions were answered to the patient's satisfaction.     [name], V. WELLS

## 2012-12-09 NOTE — H&P (View-Only) (Signed)
[facility] [place]   Patient name: Stephanie Peck MRN: [id number] DOB: 12-17-1960 Sex: female     Chief Complaint  Patient presents with  . Re-evaluation    3 month f/u     HISTORY OF PRESENT ILLNESS: The patient is here today for followup. On 04/07/2012 she underwent redo left femoral endarterectomy with patch angioplasty. I previously performed a left femoral endarterectomy and this has occluded. She came in with numbness to her left foot. Dr. Kelly Splinter provided a muscle flap for wound coverage. The patient's numbness resolved after her operation.  Her wound has now completely healed.  She does report one episode of calf cramping with activity.  She is taking a full dose aspirin as she could not tolerate Plavix.   Past Medical History  Diagnosis Date  . Diabetes mellitus   . GERD (gastroesophageal reflux disease)   . Anxiety   . CHF (congestive heart failure)   . COPD (chronic obstructive pulmonary disease)   . Sleep apnea     severe OSA by 09/08/06 sleep study ([facility])  . Depression   . Morbid obesity     Past Surgical History  Procedure Laterality Date  . Cholecystectomy    . Gastric bypass  2006  . Tonsillectomy    . Femoral artery stent  12/05/2011    right superficial   . Endarterectomy femoral  02/22/2012    Procedure: ENDARTERECTOMY FEMORAL;  Surgeon: Nada Libman, MD;  Location: [facility] OR;  Service: Vascular;  Laterality: Left;  . Patch angioplasty  02/22/2012    Procedure: PATCH ANGIOPLASTY;  Surgeon: Nada Libman, MD;  Location: [facility] OR;  Service: Vascular;  Laterality: Left;  left femoral patch angioplasty  . Application of wound vac  02/22/2012    Procedure: APPLICATION OF WOUND VAC;  Surgeon: Nada Libman, MD;  Location: [facility] OR;  Service: Vascular;  Laterality: Left;  application wound vac  . Groin debridement  03/01/2012    Procedure: GROIN DEBRIDEMENT;  Surgeon: Larina Earthly, MD;  Location: [facility] OR;  Service: Vascular;  Laterality: Left;  .  Aortogram Left 04/04/2012    Procedure: AORTOGRAM;  Surgeon: Nada Libman, MD;  Location: [facility] OR;  Service: Vascular;  Laterality: Left;  . Patch angioplasty Left 04/04/2012    Procedure: PATCH ANGIOPLASTY;  Surgeon: Nada Libman, MD;  Location: [facility] OR;  Service: Vascular;  Laterality: Left;  Marland Kitchen Muscle flap closure Left 04/04/2012    Procedure: MUSCLE FLAP CLOSURE;  Surgeon: Wayland Denis, DO;  Location: [facility] OR;  Service: Plastics;  Laterality: Left;    History   Social History  . Marital Status: Married    Spouse Name: N/A    Number of Children: N/A  . Years of Education: N/A   Occupational History  . Not on file.   Social History Main Topics  . Smoking status: Former Smoker -- 2.00 packs/day for 20 years    Types: Cigarettes    Start date: 09/30/2011    Quit date: 10/30/2011  . Smokeless tobacco: Never Used  . Alcohol Use: No  . Drug Use: No  . Sexual Activity: Not on file   Other Topics Concern  . Not on file   Social History Narrative  . No narrative on file    Family History  Problem Relation Age of Onset  . Cancer Mother   . Depression Mother   . Heart disease Father     before age 68  . Diabetes Father   .  Hypertension Father   . Hyperlipidemia Father   . Heart attack Father   . Cancer Maternal Grandmother     Allergies as of 12/01/2012 - Review Complete 12/01/2012  Allergen Reaction Noted  . Codeine Nausea Only     Current Outpatient Prescriptions on File Prior to Visit  Medication Sig Dispense Refill  . albuterol (PROVENTIL HFA;VENTOLIN HFA) 108 (90 BASE) MCG/ACT inhaler Inhale 2 puffs into the lungs every 6 (six) hours as needed. For wheezing and shortness of breath.      Marland Kitchen albuterol (PROVENTIL) (5 MG/ML) 0.5% nebulizer solution Take 2.5 mg by nebulization every 6 (six) hours as needed. For wheezing and shortness of breath.      Marland Kitchen buPROPion (WELLBUTRIN XL) 300 MG 24 hr tablet Take 300 mg by mouth daily.      . calcium carbonate (OS-CAL) 600 MG TABS  Take 600 mg by mouth 2 (two) times daily with a meal.      . CARAFATE 1 GM/10ML suspension 4 (four) times daily - after meals and at bedtime.      . Cyanocobalamin (VITAMIN B 12 PO) Take 250 mg by mouth daily.      . DULoxetine (CYMBALTA) 60 MG capsule Take 60 mg by mouth daily.      . furosemide (LASIX) 40 MG tablet Take 40 mg by mouth daily.      Marland Kitchen gabapentin (NEURONTIN) 600 MG tablet Take 1 tablet (600 mg total) by mouth 2 (two) times daily.  60 tablet  2  . glipiZIDE (GLUCOTROL) 5 MG tablet Take 5 mg by mouth 2 (two) times daily before a meal.      . ipratropium-albuterol (DUONEB) 0.5-2.5 (3) MG/3ML SOLN continuous as needed.      . lansoprazole (PREVACID) 30 MG capsule Take 30 mg by mouth 2 (two) times daily.      Marland Kitchen levofloxacin (LEVAQUIN) 500 MG tablet Take 1 tablet (500 mg total) by mouth daily.  10 tablet  0  . losartan (COZAAR) 25 MG tablet Take 1 tablet by mouth daily.      . Multiple Vitamin (MULTIVITAMIN) tablet Take 1 tablet by mouth daily.      Marland Kitchen omeprazole (PRILOSEC) 40 MG capsule 2 (two) times daily.      Marland Kitchen oxycodone (OXY-IR) 5 MG capsule Take 5 mg by mouth every 6 (six) hours as needed (5-325 prn). For pain      . promethazine (PHENERGAN) 25 MG tablet Take 25 mg by mouth every 6 (six) hours as needed. For nausea      . simvastatin (ZOCOR) 20 MG tablet Take 20 mg by mouth every evening.      . sitaGLIPtan-metformin (JANUMET) 50-1000 MG per tablet Take 1 tablet by mouth 2 (two) times daily with a meal.      . sulfamethoxazole-trimethoprim (BACTRIM DS) 800-160 MG per tablet       . SUMAtriptan (IMITREX) 25 MG tablet Take 25 mg by mouth as needed for migraine.      . topiramate (TOPAMAX) 50 MG tablet Take 50 mg by mouth 2 (two) times daily.      . clopidogrel (PLAVIX) 75 MG tablet TAKE 1 TABLET EVERY DAY  30 tablet  5  . dabigatran (PRADAXA) 150 MG CAPS Take 1 capsule (150 mg total) by mouth every 12 (twelve) hours.  60 capsule  4  . ferrous fumarate (HEMOCYTE - 106 MG FE) 325 (106  FE) MG TABS Take 1 tablet by mouth.      Marland Kitchen  indomethacin (INDOCIN) 50 MG capsule        No current facility-administered medications on file prior to visit.     REVIEW OF SYSTEMS: No changes  PHYSICAL EXAMINATION:   Vital signs are BP 138/71  Pulse 90  Ht 5\' 7"  (1.702 m)  Wt 283 lb (128.368 kg)  BMI 44.31 kg/m2  SpO2 97% General: The patient appears their stated age. HEENT:  No gross abnormalities Pulmonary:  Non labored breathing Musculoskeletal: There are no major deformities. Neurologic: No focal weakness or paresthesias are detected, Skin: There are no ulcer or rashes noted. Psychiatric: The patient has normal affect. Cardiovascular: There is a regular rate and rhythm without significant murmur appreciated.   Diagnostic Studies Duplex ultrasound was ordered and reviewed.  The ABI on the left is 0.88 with triphasic waveforms.  On the right is 0.97 with triphasic waveforms.  Elevated velocities were identified in the proximal right superficial femoral artery with peak velocity of 652.  On the left there is also an area of elevated velocities, peak velocity is 350.  This is in the proximal superficial femoral artery.  There is also an area of elevation in the left external iliac artery with velocities of 2 34 cm/s  Assessment: Status post right leg stenting and left femoral endarterectomy x2 Plan: Based on the patient's duplex today, I feel that further evaluation is warranted.  Therefore, I have recommended proceeding with abdominal aortogram with bilateral lower extremity runoff via a right femoral approach.  My main focus will be on evaluating the previous repair in the left groin as well as the left external iliac artery.  At that time I also evaluate what appears to be did note both stenosis within the proximal right superficial femoral artery this procedure is been scheduled for Tuesday, November 11  V. Charlena Cross, M.D. [facility]  [place] Office: [phone] Pager:  [phone]

## 2012-12-09 NOTE — Telephone Encounter (Addendum)
Message copied by Fredrich Birks on Tue Dec 09, 2012  3:16 PM ------      Message from: Sharee Pimple      Created: Tue Dec 09, 2012  2:02 PM      Regarding: schedule                   ----- Message -----         From: Melene Plan, RN         Sent: 12/09/2012   1:46 PM           To: Sharee Pimple, CMA, [facility]                        ----- Message -----         From: Nada Libman, MD         Sent: 12/09/2012  11:57 AM           To: Reuel Derby, Melene Plan, RN            12/09/2012:            Surgeon:  Jorge Ny      Procedure Performed:       1.  ultrasound-guided access, right femoral artery       2.  abdominal aortogram       3.  left lower extremity runoff       4.  angioplasty, left superficial femoral artery with a drug-eluting balloon       5.  stent, left external iliac artery                  Please schedule the patient for followup in the office in 3 months with a duplex ultrasound of bilateral lower extremities and ABIs. ------  12/09/12: lm for patient, dpm

## 2012-12-15 ENCOUNTER — Encounter (HOSPITAL_BASED_OUTPATIENT_CLINIC_OR_DEPARTMENT_OTHER): Payer: Medicare Other

## 2013-01-25 IMAGING — CR DG KNEE STANDING AP BILAT
1 series · 1 of 1 positions shown · non-contrast
Comparison: None.

CLINICAL DATA: Knee pain.  No injury.

BILATERAL KNEES STANDING - 1 VIEW

[w bilateral standing knees]
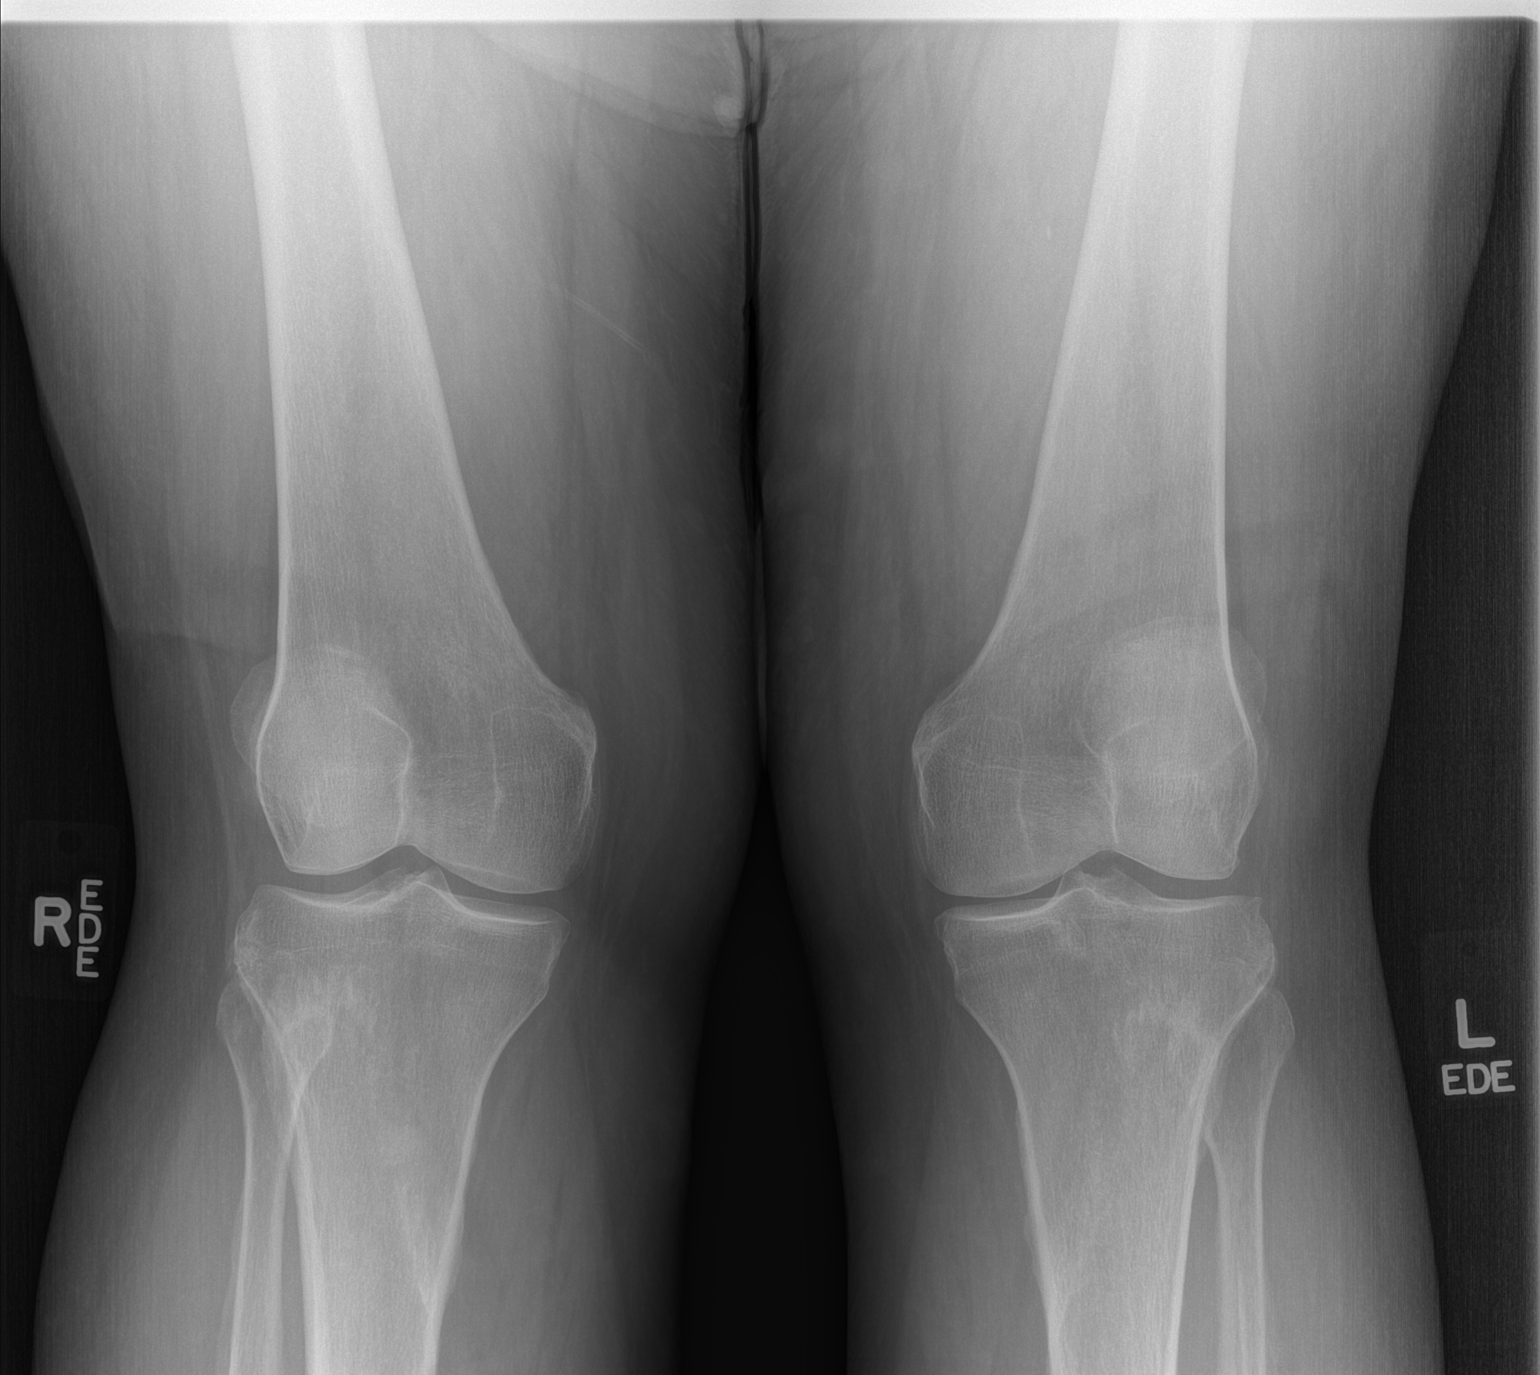

[1 of 1 positions shown; findings below may reference images not displayed]

FINDINGS: Single frontal standing view of both knees reveals
minimal left-sided medial tibiofemoral joint degenerative changes.
In the standing position, the patellar are subluxed laterally
bilaterally.
IMPRESSION: Single frontal standing view of both knees reveals minimal left-
sided medial tibiofemoral joint degenerative changes.

In the standing position, the patellar are subluxed laterally
bilaterally.

## 2013-01-25 IMAGING — CR DG CERVICAL SPINE WITH FLEX & EXTEND
8 series · 8 of 8 positions shown · non-contrast
Comparison: None.

CLINICAL DATA: Chronic neck pain.  No injury.

CERVICAL SPINE COMPLETE WITH FLEXION AND EXTENSION VIEWS

[w c-spine lat]
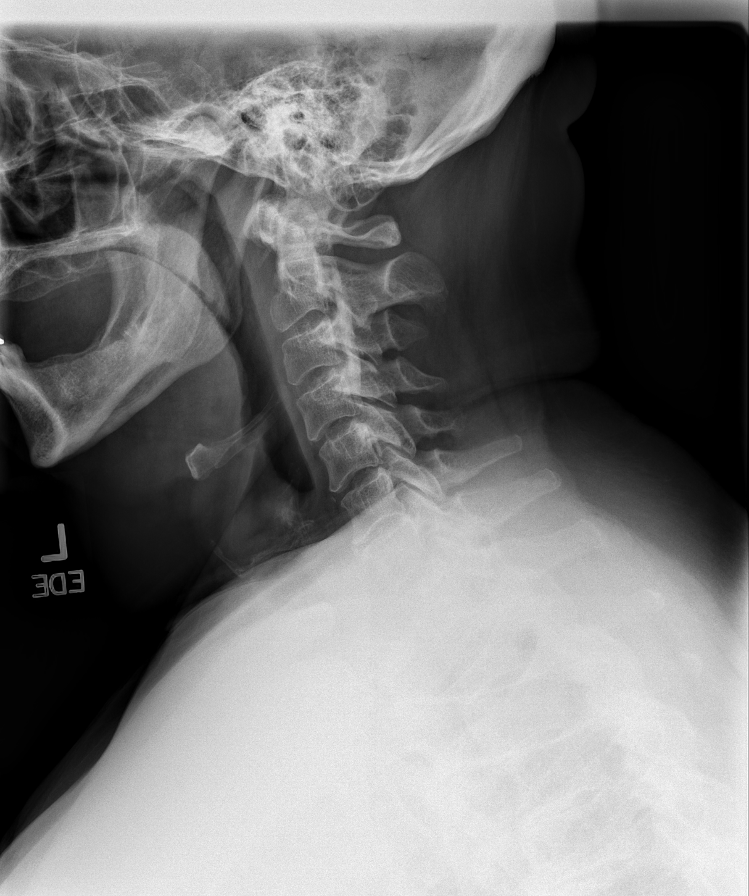

[w c-spine flexion]
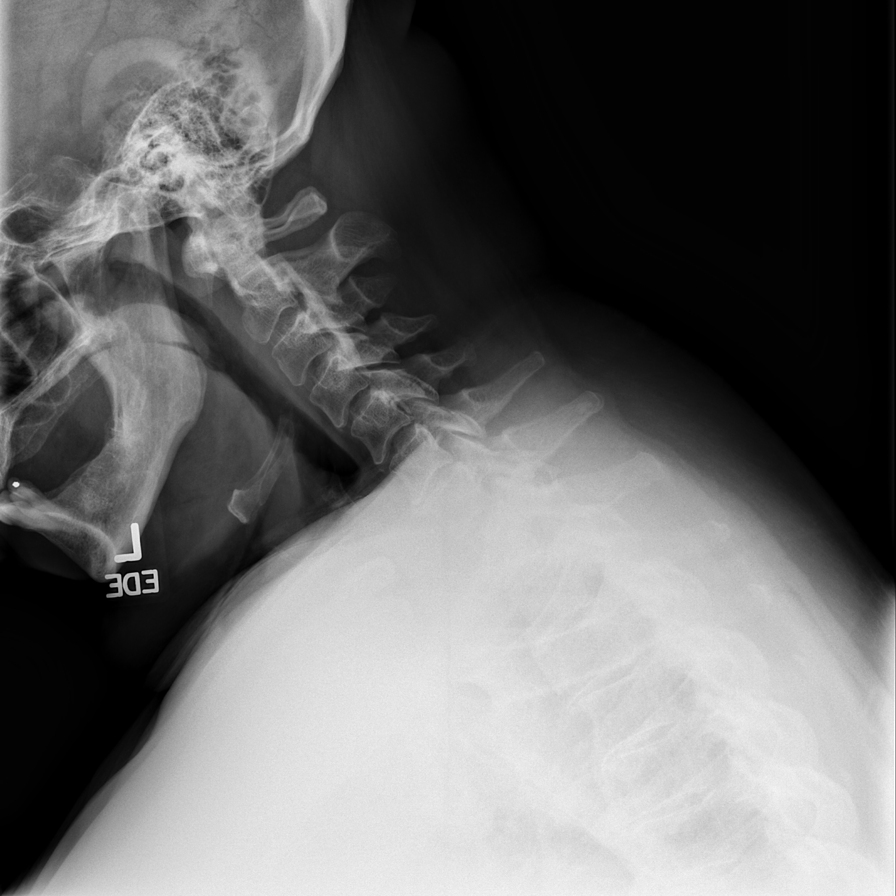

[w c-spine extension]
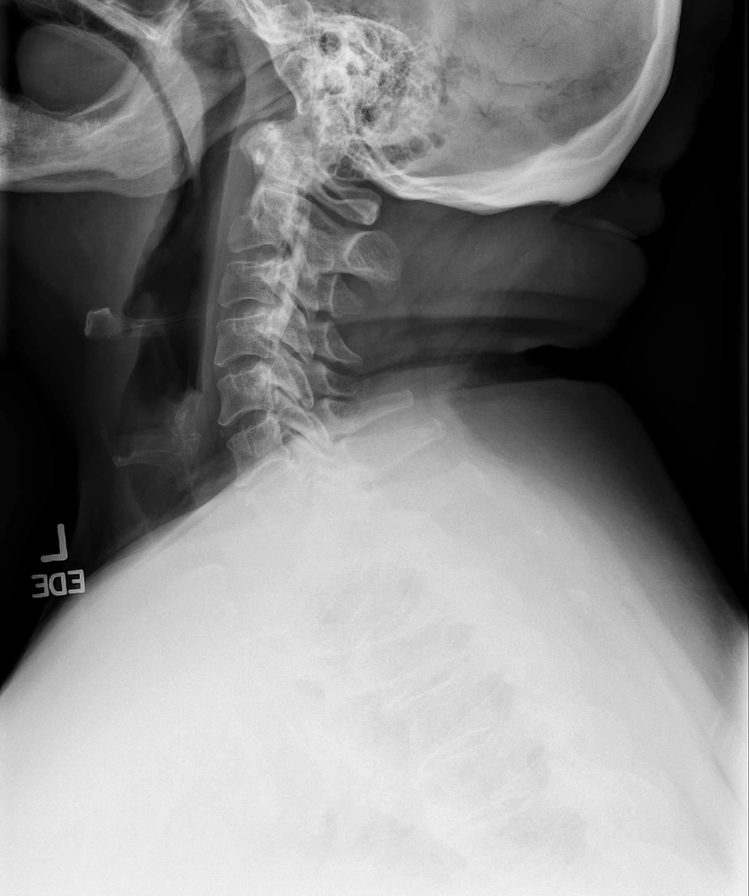

[w c-spine oblique (1 of 2)]
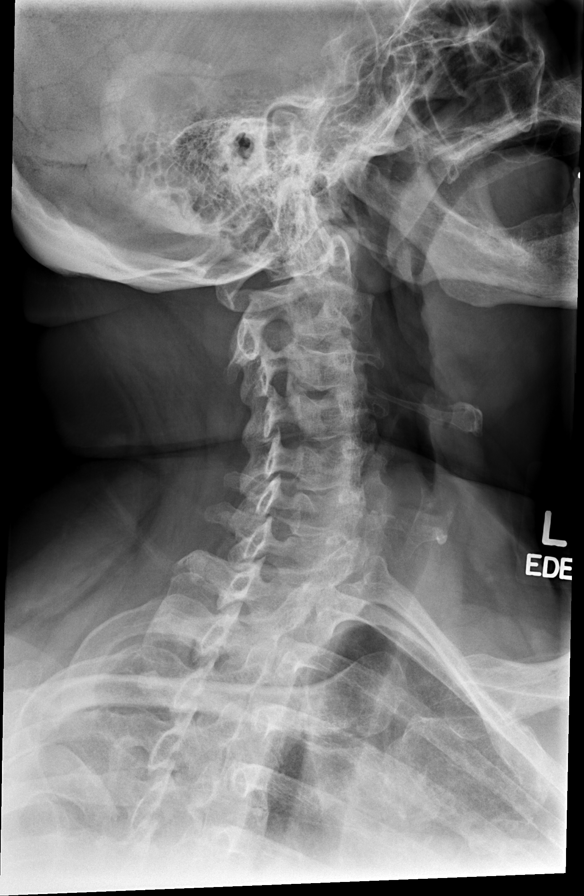

[w c-spine oblique (2 of 2)]
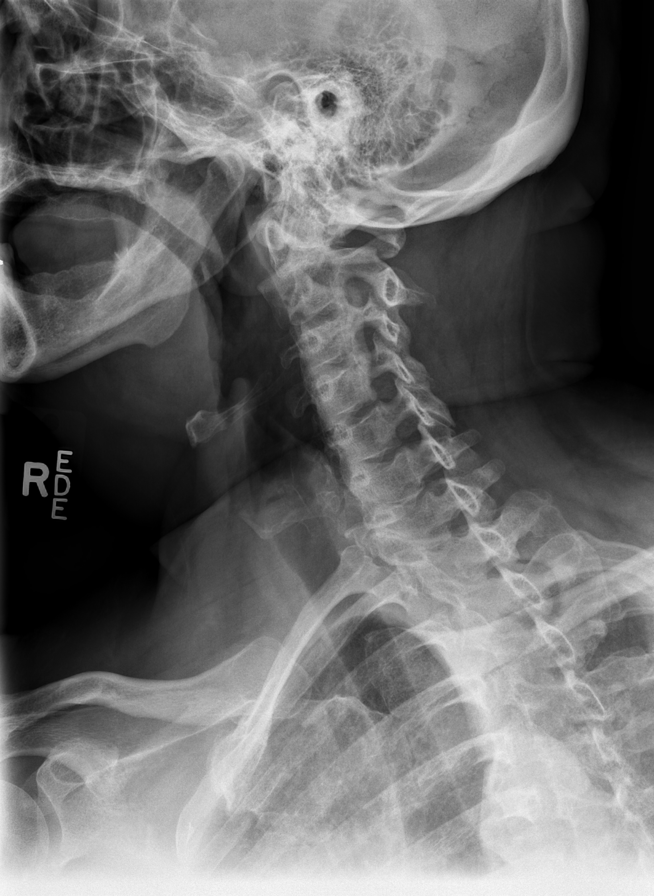

[w c-spine a.p.]
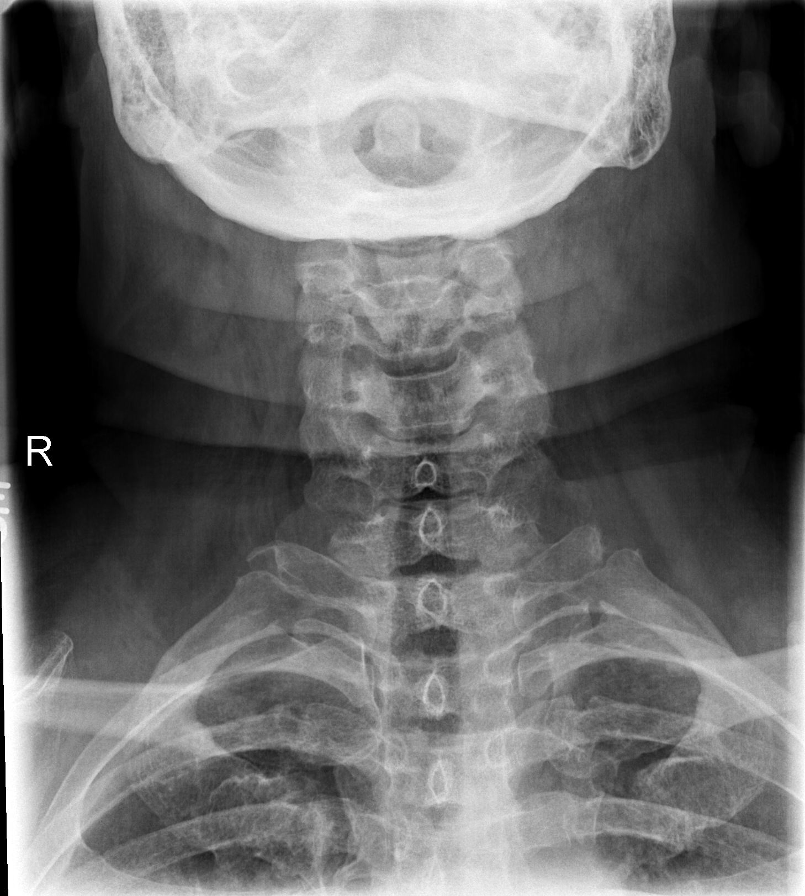

[w c-spine odontoid]
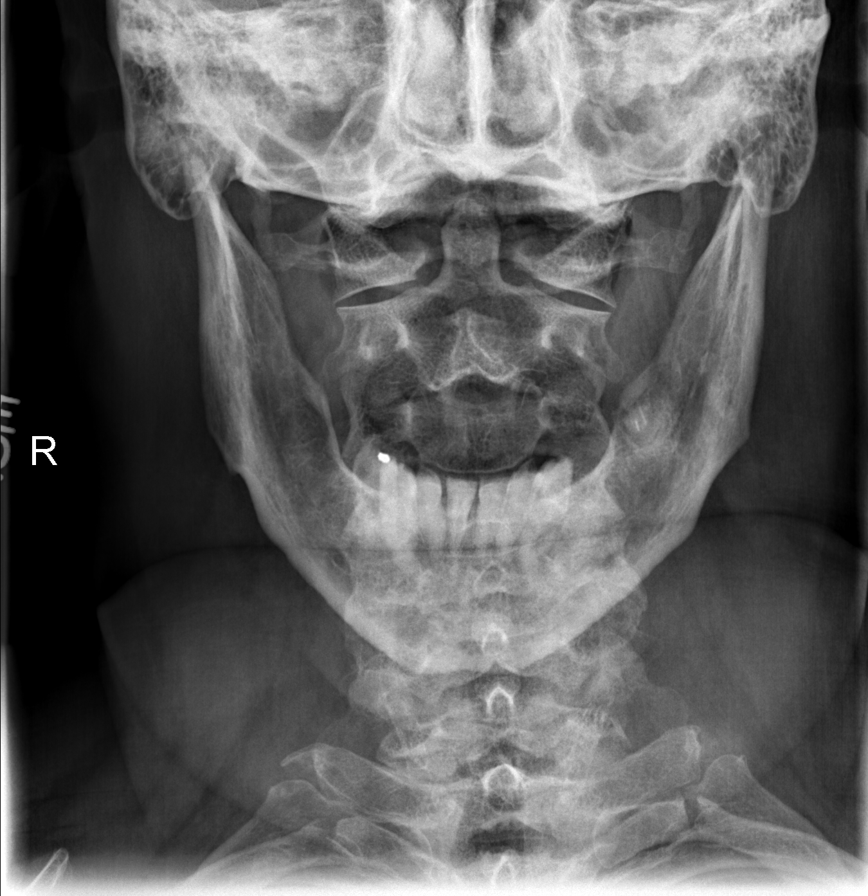

[w swimmers view]
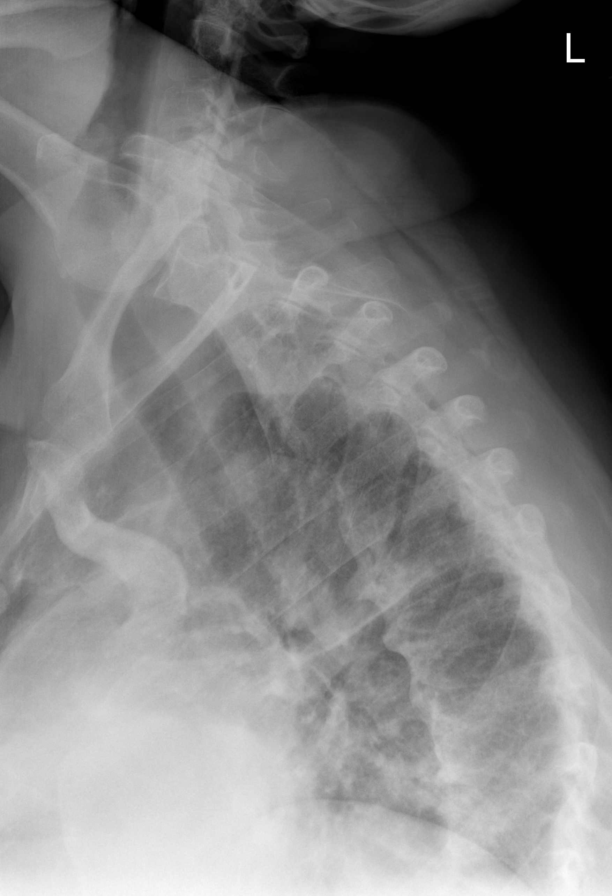

[8 of 8 positions shown; findings below may reference images not displayed]

FINDINGS: Mild degenerative changes C6-7.

No bony neural foraminal narrowing.

No abnormal motion between flexion and extension.

Lung apices clear

Degenerative changes thoracic spine.
IMPRESSION: Mild degenerative changes C6-7.

## 2013-02-02 DIAGNOSIS — E669 Obesity, unspecified: Secondary | ICD-10-CM | POA: Diagnosis not present

## 2013-02-02 DIAGNOSIS — N183 Chronic kidney disease, stage 3 unspecified: Secondary | ICD-10-CM | POA: Diagnosis not present

## 2013-02-02 DIAGNOSIS — E1142 Type 2 diabetes mellitus with diabetic polyneuropathy: Secondary | ICD-10-CM | POA: Diagnosis not present

## 2013-02-02 DIAGNOSIS — E1149 Type 2 diabetes mellitus with other diabetic neurological complication: Secondary | ICD-10-CM | POA: Diagnosis not present

## 2013-02-09 DIAGNOSIS — G43109 Migraine with aura, not intractable, without status migrainosus: Secondary | ICD-10-CM | POA: Diagnosis not present

## 2013-02-09 DIAGNOSIS — M109 Gout, unspecified: Secondary | ICD-10-CM | POA: Diagnosis not present

## 2013-02-09 DIAGNOSIS — G8929 Other chronic pain: Secondary | ICD-10-CM | POA: Diagnosis not present

## 2013-03-03 DIAGNOSIS — R7309 Other abnormal glucose: Secondary | ICD-10-CM | POA: Diagnosis not present

## 2013-03-03 DIAGNOSIS — L0231 Cutaneous abscess of buttock: Secondary | ICD-10-CM | POA: Diagnosis not present

## 2013-03-03 DIAGNOSIS — L03317 Cellulitis of buttock: Secondary | ICD-10-CM | POA: Diagnosis not present

## 2013-03-05 ENCOUNTER — Emergency Department (HOSPITAL_COMMUNITY): Payer: Medicare Other

## 2013-03-05 ENCOUNTER — Inpatient Hospital Stay (HOSPITAL_COMMUNITY)
Admission: EM | Admit: 2013-03-05 | Discharge: 2013-03-17 | DRG: 853 | Disposition: A | Discharge status: Skilled Nursing Facility | Payer: Medicare Other | Attending: Internal Medicine | Admitting: Internal Medicine

## 2013-03-05 ENCOUNTER — Encounter (HOSPITAL_COMMUNITY)
Admission: EM | Disposition: A | Discharge status: Skilled Nursing Facility | Payer: Self-pay | Source: Home / Self Care | Attending: Internal Medicine

## 2013-03-05 ENCOUNTER — Inpatient Hospital Stay (HOSPITAL_COMMUNITY): Payer: Medicare Other

## 2013-03-05 ENCOUNTER — Encounter (HOSPITAL_COMMUNITY): Payer: Medicare Other | Admitting: Certified Registered Nurse Anesthetist

## 2013-03-05 ENCOUNTER — Encounter (HOSPITAL_COMMUNITY): Payer: Self-pay | Admitting: Emergency Medicine

## 2013-03-05 ENCOUNTER — Inpatient Hospital Stay (HOSPITAL_COMMUNITY): Payer: Medicare Other | Admitting: Certified Registered Nurse Anesthetist

## 2013-03-05 DIAGNOSIS — K5900 Constipation, unspecified: Secondary | ICD-10-CM | POA: Diagnosis not present

## 2013-03-05 DIAGNOSIS — I509 Heart failure, unspecified: Secondary | ICD-10-CM

## 2013-03-05 DIAGNOSIS — J4489 Other specified chronic obstructive pulmonary disease: Secondary | ICD-10-CM | POA: Diagnosis present

## 2013-03-05 DIAGNOSIS — M545 Low back pain, unspecified: Secondary | ICD-10-CM

## 2013-03-05 DIAGNOSIS — Z66 Do not resuscitate: Secondary | ICD-10-CM | POA: Diagnosis not present

## 2013-03-05 DIAGNOSIS — E669 Obesity, unspecified: Secondary | ICD-10-CM | POA: Diagnosis present

## 2013-03-05 DIAGNOSIS — R6521 Severe sepsis with septic shock: Secondary | ICD-10-CM

## 2013-03-05 DIAGNOSIS — Z79899 Other long term (current) drug therapy: Secondary | ICD-10-CM

## 2013-03-05 DIAGNOSIS — Z885 Allergy status to narcotic agent status: Secondary | ICD-10-CM

## 2013-03-05 DIAGNOSIS — M25561 Pain in right knee: Secondary | ICD-10-CM

## 2013-03-05 DIAGNOSIS — Z515 Encounter for palliative care: Secondary | ICD-10-CM

## 2013-03-05 DIAGNOSIS — I519 Heart disease, unspecified: Secondary | ICD-10-CM

## 2013-03-05 DIAGNOSIS — N170 Acute kidney failure with tubular necrosis: Secondary | ICD-10-CM | POA: Diagnosis present

## 2013-03-05 DIAGNOSIS — R6520 Severe sepsis without septic shock: Secondary | ICD-10-CM | POA: Diagnosis present

## 2013-03-05 DIAGNOSIS — M79605 Pain in left leg: Secondary | ICD-10-CM

## 2013-03-05 DIAGNOSIS — I5033 Acute on chronic diastolic (congestive) heart failure: Secondary | ICD-10-CM | POA: Diagnosis present

## 2013-03-05 DIAGNOSIS — R918 Other nonspecific abnormal finding of lung field: Secondary | ICD-10-CM | POA: Diagnosis not present

## 2013-03-05 DIAGNOSIS — J811 Chronic pulmonary edema: Secondary | ICD-10-CM | POA: Diagnosis not present

## 2013-03-05 DIAGNOSIS — M79604 Pain in right leg: Secondary | ICD-10-CM

## 2013-03-05 DIAGNOSIS — M79609 Pain in unspecified limb: Secondary | ICD-10-CM

## 2013-03-05 DIAGNOSIS — E874 Mixed disorder of acid-base balance: Secondary | ICD-10-CM | POA: Diagnosis not present

## 2013-03-05 DIAGNOSIS — R509 Fever, unspecified: Secondary | ICD-10-CM | POA: Diagnosis not present

## 2013-03-05 DIAGNOSIS — I70209 Unspecified atherosclerosis of native arteries of extremities, unspecified extremity: Secondary | ICD-10-CM | POA: Diagnosis present

## 2013-03-05 DIAGNOSIS — Z4682 Encounter for fitting and adjustment of non-vascular catheter: Secondary | ICD-10-CM | POA: Diagnosis not present

## 2013-03-05 DIAGNOSIS — Z794 Long term (current) use of insulin: Secondary | ICD-10-CM | POA: Diagnosis not present

## 2013-03-05 DIAGNOSIS — G4733 Obstructive sleep apnea (adult) (pediatric): Secondary | ICD-10-CM | POA: Diagnosis present

## 2013-03-05 DIAGNOSIS — E119 Type 2 diabetes mellitus without complications: Secondary | ICD-10-CM | POA: Diagnosis present

## 2013-03-05 DIAGNOSIS — Z9884 Bariatric surgery status: Secondary | ICD-10-CM

## 2013-03-05 DIAGNOSIS — I200 Unstable angina: Secondary | ICD-10-CM

## 2013-03-05 DIAGNOSIS — K219 Gastro-esophageal reflux disease without esophagitis: Secondary | ICD-10-CM | POA: Diagnosis present

## 2013-03-05 DIAGNOSIS — M25552 Pain in left hip: Secondary | ICD-10-CM

## 2013-03-05 DIAGNOSIS — D72829 Elevated white blood cell count, unspecified: Secondary | ICD-10-CM

## 2013-03-05 DIAGNOSIS — R0602 Shortness of breath: Secondary | ICD-10-CM

## 2013-03-05 DIAGNOSIS — I359 Nonrheumatic aortic valve disorder, unspecified: Secondary | ICD-10-CM | POA: Diagnosis not present

## 2013-03-05 DIAGNOSIS — I729 Aneurysm of unspecified site: Secondary | ICD-10-CM

## 2013-03-05 DIAGNOSIS — D649 Anemia, unspecified: Secondary | ICD-10-CM | POA: Diagnosis not present

## 2013-03-05 DIAGNOSIS — E662 Morbid (severe) obesity with alveolar hypoventilation: Secondary | ICD-10-CM | POA: Diagnosis present

## 2013-03-05 DIAGNOSIS — Z8249 Family history of ischemic heart disease and other diseases of the circulatory system: Secondary | ICD-10-CM

## 2013-03-05 DIAGNOSIS — A419 Sepsis, unspecified organism: Secondary | ICD-10-CM | POA: Diagnosis not present

## 2013-03-05 DIAGNOSIS — Z6841 Body Mass Index (BMI) 40.0 and over, adult: Secondary | ICD-10-CM

## 2013-03-05 DIAGNOSIS — N183 Chronic kidney disease, stage 3 unspecified: Secondary | ICD-10-CM | POA: Diagnosis present

## 2013-03-05 DIAGNOSIS — IMO0001 Reserved for inherently not codable concepts without codable children: Secondary | ICD-10-CM | POA: Diagnosis present

## 2013-03-05 DIAGNOSIS — I739 Peripheral vascular disease, unspecified: Secondary | ICD-10-CM

## 2013-03-05 DIAGNOSIS — R141 Gas pain: Secondary | ICD-10-CM | POA: Diagnosis not present

## 2013-03-05 DIAGNOSIS — R609 Edema, unspecified: Secondary | ICD-10-CM | POA: Diagnosis not present

## 2013-03-05 DIAGNOSIS — E1165 Type 2 diabetes mellitus with hyperglycemia: Secondary | ICD-10-CM

## 2013-03-05 DIAGNOSIS — E872 Acidosis, unspecified: Secondary | ICD-10-CM | POA: Diagnosis not present

## 2013-03-05 DIAGNOSIS — Z87891 Personal history of nicotine dependence: Secondary | ICD-10-CM

## 2013-03-05 DIAGNOSIS — Z452 Encounter for adjustment and management of vascular access device: Secondary | ICD-10-CM | POA: Diagnosis not present

## 2013-03-05 DIAGNOSIS — L03317 Cellulitis of buttock: Secondary | ICD-10-CM

## 2013-03-05 DIAGNOSIS — G4730 Sleep apnea, unspecified: Secondary | ICD-10-CM

## 2013-03-05 DIAGNOSIS — I5020 Unspecified systolic (congestive) heart failure: Secondary | ICD-10-CM | POA: Diagnosis not present

## 2013-03-05 DIAGNOSIS — N179 Acute kidney failure, unspecified: Secondary | ICD-10-CM | POA: Diagnosis not present

## 2013-03-05 DIAGNOSIS — Z889 Allergy status to unspecified drugs, medicaments and biological substances status: Secondary | ICD-10-CM

## 2013-03-05 DIAGNOSIS — L0231 Cutaneous abscess of buttock: Secondary | ICD-10-CM | POA: Diagnosis present

## 2013-03-05 DIAGNOSIS — L089 Local infection of the skin and subcutaneous tissue, unspecified: Secondary | ICD-10-CM | POA: Diagnosis present

## 2013-03-05 DIAGNOSIS — M25551 Pain in right hip: Secondary | ICD-10-CM

## 2013-03-05 DIAGNOSIS — I723 Aneurysm of iliac artery: Secondary | ICD-10-CM | POA: Diagnosis not present

## 2013-03-05 DIAGNOSIS — M109 Gout, unspecified: Secondary | ICD-10-CM | POA: Diagnosis not present

## 2013-03-05 DIAGNOSIS — R279 Unspecified lack of coordination: Secondary | ICD-10-CM | POA: Diagnosis not present

## 2013-03-05 DIAGNOSIS — J449 Chronic obstructive pulmonary disease, unspecified: Secondary | ICD-10-CM | POA: Diagnosis not present

## 2013-03-05 DIAGNOSIS — Z7982 Long term (current) use of aspirin: Secondary | ICD-10-CM

## 2013-03-05 DIAGNOSIS — J9601 Acute respiratory failure with hypoxia: Secondary | ICD-10-CM

## 2013-03-05 DIAGNOSIS — I10 Essential (primary) hypertension: Secondary | ICD-10-CM

## 2013-03-05 DIAGNOSIS — G9340 Encephalopathy, unspecified: Secondary | ICD-10-CM | POA: Diagnosis not present

## 2013-03-05 DIAGNOSIS — E875 Hyperkalemia: Secondary | ICD-10-CM | POA: Diagnosis present

## 2013-03-05 DIAGNOSIS — I129 Hypertensive chronic kidney disease with stage 1 through stage 4 chronic kidney disease, or unspecified chronic kidney disease: Secondary | ICD-10-CM | POA: Diagnosis present

## 2013-03-05 DIAGNOSIS — I70219 Atherosclerosis of native arteries of extremities with intermittent claudication, unspecified extremity: Secondary | ICD-10-CM

## 2013-03-05 DIAGNOSIS — R1030 Lower abdominal pain, unspecified: Secondary | ICD-10-CM

## 2013-03-05 DIAGNOSIS — K612 Anorectal abscess: Secondary | ICD-10-CM | POA: Diagnosis present

## 2013-03-05 DIAGNOSIS — Z833 Family history of diabetes mellitus: Secondary | ICD-10-CM

## 2013-03-05 DIAGNOSIS — J9600 Acute respiratory failure, unspecified whether with hypoxia or hypercapnia: Secondary | ICD-10-CM

## 2013-03-05 DIAGNOSIS — A4159 Other Gram-negative sepsis: Secondary | ICD-10-CM | POA: Diagnosis not present

## 2013-03-05 DIAGNOSIS — L02219 Cutaneous abscess of trunk, unspecified: Secondary | ICD-10-CM | POA: Diagnosis not present

## 2013-03-05 DIAGNOSIS — F3289 Other specified depressive episodes: Secondary | ICD-10-CM | POA: Diagnosis present

## 2013-03-05 DIAGNOSIS — IMO0002 Reserved for concepts with insufficient information to code with codable children: Secondary | ICD-10-CM | POA: Diagnosis not present

## 2013-03-05 DIAGNOSIS — F411 Generalized anxiety disorder: Secondary | ICD-10-CM | POA: Diagnosis present

## 2013-03-05 DIAGNOSIS — R262 Difficulty in walking, not elsewhere classified: Secondary | ICD-10-CM | POA: Diagnosis not present

## 2013-03-05 DIAGNOSIS — R51 Headache: Secondary | ICD-10-CM

## 2013-03-05 DIAGNOSIS — M6281 Muscle weakness (generalized): Secondary | ICD-10-CM | POA: Diagnosis not present

## 2013-03-05 DIAGNOSIS — M25562 Pain in left knee: Secondary | ICD-10-CM

## 2013-03-05 DIAGNOSIS — F329 Major depressive disorder, single episode, unspecified: Secondary | ICD-10-CM | POA: Diagnosis present

## 2013-03-05 DIAGNOSIS — Z4889 Encounter for other specified surgical aftercare: Secondary | ICD-10-CM

## 2013-03-05 DIAGNOSIS — N289 Disorder of kidney and ureter, unspecified: Secondary | ICD-10-CM | POA: Diagnosis not present

## 2013-03-05 DIAGNOSIS — J9819 Other pulmonary collapse: Secondary | ICD-10-CM | POA: Diagnosis not present

## 2013-03-05 DIAGNOSIS — A498 Other bacterial infections of unspecified site: Secondary | ICD-10-CM | POA: Diagnosis present

## 2013-03-05 HISTORY — PX: IRRIGATION AND DEBRIDEMENT ABSCESS: SHX5252

## 2013-03-05 LAB — COMPREHENSIVE METABOLIC PANEL
ALBUMIN: 2.4 g/dL — AB (ref 3.5–5.2)
ALT: 21 U/L (ref 0–35)
AST: 37 U/L (ref 0–37)
Alkaline Phosphatase: 122 U/L — ABNORMAL HIGH (ref 39–117)
BUN: 28 mg/dL — AB (ref 6–23)
CO2: 18 meq/L — AB (ref 19–32)
CREATININE: 1.79 mg/dL — AB (ref 0.50–1.10)
Calcium: 8.7 mg/dL (ref 8.4–10.5)
Chloride: 97 mEq/L (ref 96–112)
GFR calc Af Amer: 36 mL/min — ABNORMAL LOW (ref >90)
GFR, EST NON AFRICAN AMERICAN: 31 mL/min — AB (ref >90)
Glucose, Bld: 359 mg/dL — ABNORMAL HIGH (ref 70–99)
POTASSIUM: 5.2 meq/L (ref 3.7–5.3)
Sodium: 131 mEq/L — ABNORMAL LOW (ref 137–147)
Total Bilirubin: 0.3 mg/dL (ref 0.3–1.2)
Total Protein: 7.2 g/dL (ref 6.0–8.3)

## 2013-03-05 LAB — BASIC METABOLIC PANEL
BUN: 32 mg/dL — ABNORMAL HIGH (ref 6–23)
CALCIUM: 8.5 mg/dL (ref 8.4–10.5)
CO2: 16 meq/L — AB (ref 19–32)
CREATININE: 2.41 mg/dL — AB (ref 0.50–1.10)
Chloride: 98 mEq/L (ref 96–112)
GFR calc Af Amer: 25 mL/min — ABNORMAL LOW (ref >90)
GFR calc non Af Amer: 22 mL/min — ABNORMAL LOW (ref >90)
GLUCOSE: 393 mg/dL — AB (ref 70–99)
Potassium: 4.6 mEq/L (ref 3.7–5.3)
Sodium: 132 mEq/L — ABNORMAL LOW (ref 137–147)

## 2013-03-05 LAB — URINE MICROSCOPIC-ADD ON

## 2013-03-05 LAB — CBC WITH DIFFERENTIAL/PLATELET
Basophils Absolute: 0 10*3/uL (ref 0.0–0.1)
Basophils Relative: 0 % (ref 0–1)
EOS PCT: 0 % (ref 0–5)
Eosinophils Absolute: 0 10*3/uL (ref 0.0–0.7)
HCT: 36.4 % (ref 36.0–46.0)
HEMOGLOBIN: 13 g/dL (ref 12.0–15.0)
LYMPHS ABS: 1.8 10*3/uL (ref 0.7–4.0)
LYMPHS PCT: 7 % — AB (ref 12–46)
MCH: 33.5 pg (ref 26.0–34.0)
MCHC: 35.7 g/dL (ref 30.0–36.0)
MCV: 93.8 fL (ref 78.0–100.0)
MONOS PCT: 5 % (ref 3–12)
Monocytes Absolute: 1.3 10*3/uL — ABNORMAL HIGH (ref 0.1–1.0)
NEUTROS ABS: 22.6 10*3/uL — AB (ref 1.7–7.7)
Neutrophils Relative %: 88 % — ABNORMAL HIGH (ref 43–77)
Platelets: 384 10*3/uL (ref 150–400)
RBC: 3.88 MIL/uL (ref 3.87–5.11)
RDW: 12.9 % (ref 11.5–15.5)
WBC: 25.7 10*3/uL — AB (ref 4.0–10.5)

## 2013-03-05 LAB — URINALYSIS, ROUTINE W REFLEX MICROSCOPIC
GLUCOSE, UA: 250 mg/dL — AB
HGB URINE DIPSTICK: NEGATIVE
KETONES UR: NEGATIVE mg/dL
Leukocytes, UA: NEGATIVE
Nitrite: NEGATIVE
Protein, ur: 30 mg/dL — AB
Specific Gravity, Urine: 1.028 (ref 1.005–1.030)
UROBILINOGEN UA: 1 mg/dL (ref 0.0–1.0)
pH: 5.5 (ref 5.0–8.0)

## 2013-03-05 LAB — MRSA PCR SCREENING: MRSA BY PCR: NEGATIVE

## 2013-03-05 LAB — LACTIC ACID, PLASMA
LACTIC ACID, VENOUS: 1.6 mmol/L (ref 0.5–2.2)
LACTIC ACID, VENOUS: 1.3 mmol/L (ref 0.5–2.2)

## 2013-03-05 LAB — CK: Total CK: 70 U/L (ref 7–177)

## 2013-03-05 LAB — TROPONIN I
Troponin I: <0.3 ng/mL (ref <0.30)
Troponin I: <0.3 ng/mL (ref <0.30)

## 2013-03-05 LAB — GLUCOSE, CAPILLARY: Glucose-Capillary: 355 mg/dL — ABNORMAL HIGH (ref 70–99)

## 2013-03-05 LAB — PRO B NATRIURETIC PEPTIDE: Pro B Natriuretic peptide (BNP): 443.4 pg/mL — ABNORMAL HIGH (ref 0–125)

## 2013-03-05 SURGERY — IRRIGATION AND DEBRIDEMENT ABSCESS
Anesthesia: General | Site: Buttocks | Laterality: Left

## 2013-03-05 MED ORDER — ASPIRIN EC 325 MG PO TBEC: 325.0000 mg | DELAYED_RELEASE_TABLET | Freq: Every day | ORAL | Status: DC

## 2013-03-05 MED ORDER — FENTANYL CITRATE 0.05 MG/ML IJ SOLN: 100.0000 ug | INTRAMUSCULAR | Status: DC | PRN

## 2013-03-05 MED ORDER — INSULIN ASPART 100 UNIT/ML ~~LOC~~ SOLN: 0.0000 [IU] | Freq: Three times a day (TID) | SUBCUTANEOUS | Status: DC

## 2013-03-05 MED ORDER — ALBUTEROL SULFATE (2.5 MG/3ML) 0.083% IN NEBU: 2.5000 mg | INHALATION_SOLUTION | Freq: Four times a day (QID) | RESPIRATORY_TRACT | Status: DC | PRN

## 2013-03-05 MED ORDER — PHENYLEPHRINE 40 MCG/ML (10ML) SYRINGE FOR IV PUSH (FOR BLOOD PRESSURE SUPPORT)
PREFILLED_SYRINGE | INTRAVENOUS | Status: AC
  Filled 2013-03-05: qty 10

## 2013-03-05 MED ORDER — EPHEDRINE SULFATE 50 MG/ML IJ SOLN
INTRAMUSCULAR | Status: AC
  Filled 2013-03-05: qty 1

## 2013-03-05 MED ORDER — SODIUM CHLORIDE 0.9 % IV BOLUS (SEPSIS)
500.0000 mL | Freq: Once | INTRAVENOUS | Status: AC
  Administered 2013-03-05: 500 mL via INTRAVENOUS

## 2013-03-05 MED ORDER — MIDAZOLAM HCL 2 MG/2ML IJ SOLN
2.0000 mg | INTRAMUSCULAR | Status: AC | PRN
  Administered 2013-03-06 (×3): 2 mg via INTRAVENOUS
  Filled 2013-03-05 (×2): qty 2

## 2013-03-05 MED ORDER — ACETAMINOPHEN 325 MG PO TABS
650.0000 mg | ORAL_TABLET | Freq: Once | ORAL | Status: AC
  Administered 2013-03-05: 650 mg via ORAL
  Filled 2013-03-05: qty 2

## 2013-03-05 MED ORDER — SUCCINYLCHOLINE CHLORIDE 20 MG/ML IJ SOLN
INTRAMUSCULAR | Status: AC
  Filled 2013-03-05: qty 1

## 2013-03-05 MED ORDER — OXYCODONE HCL 5 MG PO TABS: 5.0000 mg | ORAL_TABLET | Freq: Once | ORAL | Status: DC | PRN

## 2013-03-05 MED ORDER — ARTIFICIAL TEARS OP OINT
TOPICAL_OINTMENT | OPHTHALMIC | Status: DC | PRN
  Administered 2013-03-05: 1 via OPHTHALMIC

## 2013-03-05 MED ORDER — MIDAZOLAM HCL 2 MG/2ML IJ SOLN
INTRAMUSCULAR | Status: AC
  Filled 2013-03-05: qty 2

## 2013-03-05 MED ORDER — HEPARIN SODIUM (PORCINE) 5000 UNIT/ML IJ SOLN
5000.0000 [IU] | Freq: Three times a day (TID) | INTRAMUSCULAR | Status: DC
  Filled 2013-03-05 (×2): qty 1

## 2013-03-05 MED ORDER — METOCLOPRAMIDE HCL 5 MG/ML IJ SOLN
10.0000 mg | Freq: Once | INTRAMUSCULAR | Status: AC | PRN
  Filled 2013-03-05: qty 2

## 2013-03-05 MED ORDER — PROPOFOL 10 MG/ML IV BOLUS
INTRAVENOUS | Status: AC
  Filled 2013-03-05: qty 20

## 2013-03-05 MED ORDER — PANTOPRAZOLE SODIUM 40 MG PO TBEC: 80.0000 mg | DELAYED_RELEASE_TABLET | Freq: Every day | ORAL | Status: DC

## 2013-03-05 MED ORDER — PROPOFOL 10 MG/ML IV BOLUS
INTRAVENOUS | Status: DC | PRN
  Administered 2013-03-05: 50 mg via INTRAVENOUS
  Administered 2013-03-05: 170 mg via INTRAVENOUS
  Administered 2013-03-05: 30 mg via INTRAVENOUS

## 2013-03-05 MED ORDER — FENTANYL CITRATE 0.05 MG/ML IJ SOLN
INTRAMUSCULAR | Status: AC
  Filled 2013-03-05: qty 5

## 2013-03-05 MED ORDER — ONDANSETRON HCL 4 MG/2ML IJ SOLN: 4.0000 mg | Freq: Four times a day (QID) | INTRAMUSCULAR | Status: DC | PRN

## 2013-03-05 MED ORDER — VANCOMYCIN HCL 10 G IV SOLR
1750.0000 mg | INTRAVENOUS | Status: AC
  Administered 2013-03-06 – 2013-03-07 (×2): 1750 mg via INTRAVENOUS
  Filled 2013-03-05 (×2): qty 1750

## 2013-03-05 MED ORDER — ACETAMINOPHEN 650 MG RE SUPP: 650.0000 mg | Freq: Four times a day (QID) | RECTAL | Status: DC | PRN

## 2013-03-05 MED ORDER — DULOXETINE HCL 60 MG PO CPEP: 60.0000 mg | ORAL_CAPSULE | Freq: Every day | ORAL | Status: DC

## 2013-03-05 MED ORDER — BUPROPION HCL ER (XL) 300 MG PO TB24: 300.0000 mg | ORAL_TABLET | Freq: Every day | ORAL | Status: DC

## 2013-03-05 MED ORDER — LIDOCAINE HCL (CARDIAC) 20 MG/ML IV SOLN
INTRAVENOUS | Status: AC
  Filled 2013-03-05: qty 5

## 2013-03-05 MED ORDER — ALBUTEROL SULFATE (5 MG/ML) 0.5% IN NEBU: 2.5000 mg | INHALATION_SOLUTION | Freq: Four times a day (QID) | RESPIRATORY_TRACT | Status: DC | PRN

## 2013-03-05 MED ORDER — MORPHINE SULFATE 4 MG/ML IJ SOLN
6.0000 mg | Freq: Once | INTRAMUSCULAR | Status: AC
  Administered 2013-03-05: 6 mg via INTRAVENOUS
  Filled 2013-03-05: qty 2

## 2013-03-05 MED ORDER — PANTOPRAZOLE SODIUM 40 MG IV SOLR
40.0000 mg | INTRAVENOUS | Status: DC
  Administered 2013-03-05 – 2013-03-06 (×2): 40 mg via INTRAVENOUS
  Filled 2013-03-05 (×3): qty 40

## 2013-03-05 MED ORDER — SODIUM CHLORIDE 0.9 % IJ SOLN
3.0000 mL | Freq: Two times a day (BID) | INTRAMUSCULAR | Status: DC
Start: 2013-03-05 — End: 2013-03-14
  Administered 2013-03-05 – 2013-03-14 (×9): 3 mL via INTRAVENOUS

## 2013-03-05 MED ORDER — MIDAZOLAM HCL 5 MG/5ML IJ SOLN
INTRAMUSCULAR | Status: DC | PRN
  Administered 2013-03-05: 2 mg via INTRAVENOUS

## 2013-03-05 MED ORDER — FENTANYL CITRATE 0.05 MG/ML IJ SOLN
INTRAMUSCULAR | Status: DC | PRN
  Administered 2013-03-05 (×2): 50 ug via INTRAVENOUS
  Administered 2013-03-05: 100 ug via INTRAVENOUS
  Administered 2013-03-05 (×6): 50 ug via INTRAVENOUS

## 2013-03-05 MED ORDER — LIDOCAINE HCL 1 % IJ SOLN
INTRAMUSCULAR | Status: DC | PRN
  Administered 2013-03-05: 70 mg via INTRADERMAL

## 2013-03-05 MED ORDER — ROCURONIUM BROMIDE 50 MG/5ML IV SOLN
INTRAVENOUS | Status: AC
  Filled 2013-03-05: qty 1

## 2013-03-05 MED ORDER — PIPERACILLIN-TAZOBACTAM 3.375 G IVPB
3.3750 g | Freq: Three times a day (TID) | INTRAVENOUS | Status: DC
  Administered 2013-03-05 – 2013-03-08 (×8): 3.375 g via INTRAVENOUS
  Filled 2013-03-05 (×11): qty 50

## 2013-03-05 MED ORDER — SODIUM CHLORIDE 0.9 % IJ SOLN
INTRAMUSCULAR | Status: AC
  Filled 2013-03-05: qty 10

## 2013-03-05 MED ORDER — OXYCODONE HCL 5 MG/5ML PO SOLN: 5.0000 mg | Freq: Once | ORAL | Status: DC | PRN

## 2013-03-05 MED ORDER — SUCCINYLCHOLINE CHLORIDE 20 MG/ML IJ SOLN
INTRAMUSCULAR | Status: DC | PRN
  Administered 2013-03-05: 120 mg via INTRAVENOUS

## 2013-03-05 MED ORDER — PIPERACILLIN-TAZOBACTAM 3.375 G IVPB 30 MIN
3.3750 g | Freq: Once | INTRAVENOUS | Status: AC
  Administered 2013-03-05: 3.375 g via INTRAVENOUS
  Filled 2013-03-05: qty 50

## 2013-03-05 MED ORDER — FENTANYL BOLUS VIA INFUSION
50.0000 ug | INTRAVENOUS | Status: DC | PRN
  Administered 2013-03-06 – 2013-03-09 (×4): 100 ug via INTRAVENOUS
  Filled 2013-03-05: qty 100

## 2013-03-05 MED ORDER — HYDROMORPHONE HCL PF 1 MG/ML IJ SOLN: 0.2500 mg | INTRAMUSCULAR | Status: DC | PRN

## 2013-03-05 MED ORDER — INSULIN DETEMIR 100 UNIT/ML ~~LOC~~ SOLN: 24.0000 [IU] | Freq: Every day | SUBCUTANEOUS | Status: DC

## 2013-03-05 MED ORDER — ACETAMINOPHEN 325 MG PO TABS: 650.0000 mg | ORAL_TABLET | Freq: Four times a day (QID) | ORAL | Status: DC | PRN

## 2013-03-05 MED ORDER — FUROSEMIDE 10 MG/ML IJ SOLN
40.0000 mg | Freq: Once | INTRAMUSCULAR | Status: AC
  Administered 2013-03-05: 40 mg via INTRAVENOUS
  Filled 2013-03-05: qty 4

## 2013-03-05 MED ORDER — ONDANSETRON HCL 4 MG/2ML IJ SOLN
INTRAMUSCULAR | Status: AC
  Filled 2013-03-05: qty 2

## 2013-03-05 MED ORDER — PHENYLEPHRINE HCL 10 MG/ML IJ SOLN
INTRAMUSCULAR | Status: AC
  Filled 2013-03-05: qty 1

## 2013-03-05 MED ORDER — VANCOMYCIN HCL IN DEXTROSE 1-5 GM/200ML-% IV SOLN
1000.0000 mg | INTRAVENOUS | Status: AC
  Administered 2013-03-05: 1000 mg via INTRAVENOUS
  Filled 2013-03-05: qty 200

## 2013-03-05 MED ORDER — ALBUTEROL SULFATE HFA 108 (90 BASE) MCG/ACT IN AERS
INHALATION_SPRAY | RESPIRATORY_TRACT | Status: AC
  Filled 2013-03-05: qty 6.7

## 2013-03-05 MED ORDER — ARTIFICIAL TEARS OP OINT
TOPICAL_OINTMENT | OPHTHALMIC | Status: AC
  Filled 2013-03-05: qty 3.5

## 2013-03-05 MED ORDER — SODIUM CHLORIDE 0.9 % IV SOLN
INTRAVENOUS | Status: DC | PRN
  Administered 2013-03-05 (×2): via INTRAVENOUS

## 2013-03-05 MED ORDER — PHENYLEPHRINE HCL 10 MG/ML IJ SOLN
INTRAMUSCULAR | Status: DC | PRN
  Administered 2013-03-05 (×3): 40 ug via INTRAVENOUS

## 2013-03-05 MED ORDER — INSULIN ASPART 100 UNIT/ML ~~LOC~~ SOLN
0.0000 [IU] | SUBCUTANEOUS | Status: DC
  Administered 2013-03-05: 20 [IU] via SUBCUTANEOUS
  Administered 2013-03-06: 4 [IU] via SUBCUTANEOUS
  Administered 2013-03-06: 20 [IU] via SUBCUTANEOUS
  Administered 2013-03-06 (×2): 11 [IU] via SUBCUTANEOUS
  Administered 2013-03-06: 7 [IU] via SUBCUTANEOUS
  Administered 2013-03-07: 11 [IU] via SUBCUTANEOUS
  Administered 2013-03-07: 20 [IU] via SUBCUTANEOUS
  Administered 2013-03-07: 7 [IU] via SUBCUTANEOUS
  Administered 2013-03-07: 15 [IU] via SUBCUTANEOUS
  Administered 2013-03-07 – 2013-03-08 (×4): 11 [IU] via SUBCUTANEOUS
  Administered 2013-03-08 (×2): 7 [IU] via SUBCUTANEOUS
  Administered 2013-03-08: 4 [IU] via SUBCUTANEOUS
  Administered 2013-03-08: 11 [IU] via SUBCUTANEOUS
  Administered 2013-03-09 (×4): 4 [IU] via SUBCUTANEOUS
  Administered 2013-03-09: 7 [IU] via SUBCUTANEOUS
  Administered 2013-03-09: 4 [IU] via SUBCUTANEOUS
  Administered 2013-03-10 (×2): 7 [IU] via SUBCUTANEOUS
  Administered 2013-03-10: 11 [IU] via SUBCUTANEOUS
  Administered 2013-03-10 (×2): 4 [IU] via SUBCUTANEOUS
  Administered 2013-03-10: 11 [IU] via SUBCUTANEOUS
  Administered 2013-03-11: 7 [IU] via SUBCUTANEOUS
  Administered 2013-03-11 (×2): 11 [IU] via SUBCUTANEOUS
  Administered 2013-03-11: 15 [IU] via SUBCUTANEOUS
  Administered 2013-03-11: 7 [IU] via SUBCUTANEOUS
  Administered 2013-03-11: 11 [IU] via SUBCUTANEOUS
  Administered 2013-03-12: 15 [IU] via SUBCUTANEOUS
  Administered 2013-03-12: 11 [IU] via SUBCUTANEOUS
  Administered 2013-03-12: 7 [IU] via SUBCUTANEOUS
  Administered 2013-03-12 (×3): 11 [IU] via SUBCUTANEOUS
  Administered 2013-03-13: 15 [IU] via SUBCUTANEOUS
  Administered 2013-03-13: 7 [IU] via SUBCUTANEOUS
  Administered 2013-03-13: 20 [IU] via SUBCUTANEOUS
  Administered 2013-03-13 (×2): 11 [IU] via SUBCUTANEOUS
  Administered 2013-03-13: 15 [IU] via SUBCUTANEOUS
  Administered 2013-03-14: 7 [IU] via SUBCUTANEOUS
  Administered 2013-03-14 (×2): 11 [IU] via SUBCUTANEOUS
  Administered 2013-03-14: 15 [IU] via SUBCUTANEOUS

## 2013-03-05 MED ORDER — OXYCODONE-ACETAMINOPHEN 5-325 MG PO TABS: 1.0000 | ORAL_TABLET | Freq: Four times a day (QID) | ORAL | Status: DC | PRN

## 2013-03-05 MED ORDER — ONDANSETRON HCL 4 MG/2ML IJ SOLN
INTRAMUSCULAR | Status: DC | PRN
  Administered 2013-03-05: 4 mg via INTRAVENOUS

## 2013-03-05 MED ORDER — ALBUTEROL SULFATE (2.5 MG/3ML) 0.083% IN NEBU
2.5000 mg | INHALATION_SOLUTION | RESPIRATORY_TRACT | Status: DC | PRN
  Administered 2013-03-05 – 2013-03-06 (×2): 2.5 mg via RESPIRATORY_TRACT
  Filled 2013-03-05 (×2): qty 3

## 2013-03-05 MED ORDER — ONDANSETRON HCL 4 MG PO TABS: 4.0000 mg | ORAL_TABLET | Freq: Four times a day (QID) | ORAL | Status: DC | PRN

## 2013-03-05 MED ORDER — VANCOMYCIN HCL IN DEXTROSE 1-5 GM/200ML-% IV SOLN
1000.0000 mg | Freq: Once | INTRAVENOUS | Status: AC
  Administered 2013-03-05: 1000 mg via INTRAVENOUS
  Filled 2013-03-05: qty 200

## 2013-03-05 MED ORDER — FENTANYL CITRATE 0.05 MG/ML IJ SOLN
0.0000 ug/h | INTRAMUSCULAR | Status: DC
  Administered 2013-03-05: 50 ug/h via INTRAVENOUS
  Administered 2013-03-06 – 2013-03-08 (×5): 250 ug/h via INTRAVENOUS
  Administered 2013-03-09: 300 ug/h via INTRAVENOUS
  Administered 2013-03-09: 120 ug/h via INTRAVENOUS
  Administered 2013-03-09: 200 ug/h via INTRAVENOUS
  Administered 2013-03-10: 250 ug/h via INTRAVENOUS
  Administered 2013-03-10: 300 ug/h via INTRAVENOUS
  Administered 2013-03-11: 150 ug/h via INTRAVENOUS
  Administered 2013-03-12 (×2): 200 ug/h via INTRAVENOUS
  Administered 2013-03-13: 300 ug/h via INTRAVENOUS
  Filled 2013-03-05 (×18): qty 50

## 2013-03-05 MED ORDER — SODIUM CHLORIDE 0.9 % IV SOLN
INTRAVENOUS | Status: AC
  Administered 2013-03-05 – 2013-03-06 (×2): via INTRAVENOUS

## 2013-03-05 MED ORDER — 0.9 % SODIUM CHLORIDE (POUR BTL) OPTIME
TOPICAL | Status: DC | PRN
  Administered 2013-03-05: 1000 mL

## 2013-03-05 MED ORDER — MIDAZOLAM HCL 2 MG/2ML IJ SOLN
2.0000 mg | INTRAMUSCULAR | Status: DC | PRN
Start: 2013-03-05 — End: 2013-03-14
  Administered 2013-03-07 – 2013-03-12 (×13): 2 mg via INTRAVENOUS
  Filled 2013-03-05 (×15): qty 2

## 2013-03-05 MED ORDER — FENTANYL CITRATE 0.05 MG/ML IJ SOLN
50.0000 ug | Freq: Once | INTRAMUSCULAR | Status: AC
  Administered 2013-03-05: 50 ug via INTRAVENOUS
  Filled 2013-03-05: qty 2

## 2013-03-05 SURGICAL SUPPLY — 29 items
BANDAGE GAUZE ELAST BULKY 4 IN (GAUZE/BANDAGES/DRESSINGS) ×2 IMPLANT
BNDG GAUZE ELAST 4 BULKY (GAUZE/BANDAGES/DRESSINGS) ×2 IMPLANT
CANISTER SUCTION 2500CC (MISCELLANEOUS) ×3 IMPLANT
COVER MAYO STAND STRL (DRAPES) ×2 IMPLANT
COVER SURGICAL LIGHT HANDLE (MISCELLANEOUS) ×3 IMPLANT
DRAPE LAPAROSCOPIC ABDOMINAL (DRAPES) ×3 IMPLANT
DRAPE UTILITY 15X26 W/TAPE STR (DRAPE) ×6 IMPLANT
DRSG PAD ABDOMINAL 8X10 ST (GAUZE/BANDAGES/DRESSINGS) ×3 IMPLANT
ELECT CAUTERY BLADE 6.4 (BLADE) ×3 IMPLANT
ELECT REM PT RETURN 9FT ADLT (ELECTROSURGICAL) ×3
ELECTRODE REM PT RTRN 9FT ADLT (ELECTROSURGICAL) ×1 IMPLANT
GLOVE BIO SURGEON STRL SZ8 (GLOVE) ×3 IMPLANT
GLOVE BIOGEL PI IND STRL 8 (GLOVE) ×1 IMPLANT
GLOVE BIOGEL PI INDICATOR 8 (GLOVE) ×2
GOWN STRL NON-REIN LRG LVL3 (GOWN DISPOSABLE) ×3 IMPLANT
GOWN STRL REIN XL XLG (GOWN DISPOSABLE) ×3 IMPLANT
KIT BASIN OR (CUSTOM PROCEDURE TRAY) ×3 IMPLANT
KIT ROOM TURNOVER OR (KITS) ×3 IMPLANT
NS IRRIG 1000ML POUR BTL (IV SOLUTION) ×3 IMPLANT
PACK GENERAL/GYN (CUSTOM PROCEDURE TRAY) ×3 IMPLANT
PACK LITHOTOMY IV (CUSTOM PROCEDURE TRAY) ×2 IMPLANT
PAD ABD 8X10 STRL (GAUZE/BANDAGES/DRESSINGS) ×2 IMPLANT
PAD ARMBOARD 7.5X6 YLW CONV (MISCELLANEOUS) ×3 IMPLANT
SPONGE GAUZE 4X4 12PLY (GAUZE/BANDAGES/DRESSINGS) ×3 IMPLANT
SPONGE LAP 18X18 X RAY DECT (DISPOSABLE) ×2 IMPLANT
SWAB COLLECTION DEVICE MRSA (MISCELLANEOUS) ×2 IMPLANT
TOWEL OR 17X24 6PK STRL BLUE (TOWEL DISPOSABLE) ×3 IMPLANT
TOWEL OR 17X26 10 PK STRL BLUE (TOWEL DISPOSABLE) ×3 IMPLANT
TUBE ANAEROBIC SPECIMEN COL (MISCELLANEOUS) ×2 IMPLANT

## 2013-03-05 NOTE — Op Note (Signed)
03/05/2013  10:02 PM  PATIENT:  Stephanie Peck  54 y.o. female  PRE-OPERATIVE DIAGNOSIS:  Perirectal Abscess  POST-OPERATIVE DIAGNOSIS:  Perirectal Abscess with necrotizing soft tissue infection  PROCEDURE:  Procedure(s): IRRIGATION AND DEBRIDEMENT LEFT PERIRECTAL AND BUTTOCK ABSCESS  SURGEON:  Surgeon(s): Zenovia Jarred, MD  PHYSICIAN ASSISTANT:   ASSISTANTS: none   ANESTHESIA:   general  EBL:  Total I/O In: 700 [I.V.:500; IV Piggyback:200] Out: 425 [Urine:325; Blood:100]  BLOOD ADMINISTERED:none  DRAINS: none   SPECIMEN:  Excision  DISPOSITION OF SPECIMEN:  PATHOLOGY  COUNTS:  YES  DICTATION: .Dragon Dictation Patient was admitted today with SIRS and a left pararectal abscess. She was started on broad-spectrum antibiotics. My partner, Dr. Donne Hazel, saw her in consultation. She was found to have a large left perirectal and buttock area abscess with necrotic skin. She is brought for emergent incision and drainage. Patient was identified in the preop holding area. She is on IV antibiotic protocol. Informed consent was obtained. She was brought to the operating room. General endotracheal anesthesia was administered by the anesthesia staff. She was placed in lithotomy position. Perineum and buttocks were prepped and draped in sterile fashion. There was a large area of necrotic skin on her left buttock. This was excised with the Bovie. Underlying this area was a large collection of pus. This was sent for culture. There were multiple loculations and tunnels. These were broken up. There was a large amount of necrotic fat as well. This was debrided back to viable tissue. The area was copiously irrigated. Hemostasis was obtained with cautery. No further tunneling or undrained purulence was noted. Wound was packed with saline soaked Kerlix and covered with sterile gauze. Due to the degree of respiratory failure preoperatively, decision was made to keep her intubated and transfer her to  the intensive care unit. I have consulted Dr. Annie Sable from the critical care medicine service to assist in her care.All counts were correct. There were no apparent competitions.  PATIENT DISPOSITION:  ICU - intubated and critically ill.   Delay start of Pharmacological VTE agent (>24hrs) due to surgical blood loss or risk of bleeding:  no  Georganna Skeans, MD, MPH, FACS Pager: [phone]  2/5/201510:02 PM

## 2013-03-05 NOTE — Consult Note (Signed)
Reason for Consult:perirectal abscess Referring Physician: Dr. Shanon Brow Tat  HPI: Stephanie Peck is sa 53 year old female with a history of CHF, DM, obesity, COPD and OSA who presented to [facility] with gluteal pain.  Duration of symptoms is 4 days.  Onset was sudden.  Characterized as a sting.  Coarse is worsening.  Time pattern is constant.  Associated with fever, chills, elevated CBGs and malaise.  She reports it began draining last night.  Previous symptoms in February to left groin which required incision and drainage. She was seen by primary care and started on bactrim, did not have much improvement and was therefore referred to the ED.  No aggravating or alleviating factors.   She was found to have a white count of 25.7K.  CT of pelvis concerning for a perirectal abscess.  Afebrile, stable vital signs.  She normally sleeps on 3 pillows, is unable to walk up a flight of stairs.  She uses a CPAP at home.  She denies chest pains or palpitations.   Past Medical History  Diagnosis Date  . Diabetes mellitus   . GERD (gastroesophageal reflux disease)   . Anxiety   . CHF (congestive heart failure)   . COPD (chronic obstructive pulmonary disease)   . Sleep apnea     severe OSA by 09/08/06 sleep study ([place])  . Depression   . Morbid obesity     Past Surgical History  Procedure Laterality Date  . Cholecystectomy    . Gastric bypass  2006  . Tonsillectomy    . Femoral artery stent  12/05/2011    right superficial   . Endarterectomy femoral  02/22/2012    Procedure: ENDARTERECTOMY FEMORAL;  Surgeon: Serafina Mitchell, MD;  Location: [place];  Service: Vascular;  Laterality: Left;  . Patch angioplasty  02/22/2012    Procedure: PATCH ANGIOPLASTY;  Surgeon: Serafina Mitchell, MD;  Location: [facility] OR;  Service: Vascular;  Laterality: Left;  left femoral patch angioplasty  . Application of wound vac  02/22/2012    Procedure: APPLICATION OF WOUND VAC;  Surgeon: Serafina Mitchell, MD;  Location: [place];  Service: Vascular;   Laterality: Left;  application wound vac  . Groin debridement  03/01/2012    Procedure: GROIN DEBRIDEMENT;  Surgeon: Rosetta Posner, MD;  Location: [place];  Service: Vascular;  Laterality: Left;  . Aortogram Left 04/04/2012    Procedure: AORTOGRAM;  Surgeon: Serafina Mitchell, MD;  Location: [place];  Service: Vascular;  Laterality: Left;  . Patch angioplasty Left 04/04/2012    Procedure: PATCH ANGIOPLASTY;  Surgeon: Serafina Mitchell, MD;  Location: [place];  Service: Vascular;  Laterality: Left;  Marland Kitchen Muscle flap closure Left 04/04/2012    Procedure: MUSCLE FLAP CLOSURE;  Surgeon: Theodoro Kos, DO;  Location: [place];  Service: Plastics;  Laterality: Left;    Family History  Problem Relation Age of Onset  . Cancer Mother   . Depression Mother   . Heart disease Father     before age 66  . Diabetes Father   . Hypertension Father   . Hyperlipidemia Father   . Heart attack Father   . Cancer Maternal Grandmother     Social History:  reports that she quit smoking about 16 months ago. Her smoking use included Cigarettes. She started smoking about 17 months ago. She has a 40 pack-year smoking history. She has never used smokeless tobacco. She reports that she does not drink alcohol or use illicit drugs.  Allergies:  Allergies  Allergen Reactions  . Celebrex [Celecoxib] Shortness Of Breath  . Codeine Nausea Only    Medications: reviewed.    Results for orders placed during the hospital encounter of 03/05/13 (from the past 48 hour(s))  CBC WITH DIFFERENTIAL     Status: Abnormal   Collection Time    03/05/13 12:35 PM      Result Value Range   WBC 25.7 (*) 4.0 - 10.5 K/uL   RBC 3.88  3.87 - 5.11 MIL/uL   Hemoglobin 13.0  12.0 - 15.0 g/dL   HCT 36.4  36.0 - 46.0 %   MCV 93.8  78.0 - 100.0 fL   MCH 33.5  26.0 - 34.0 pg   MCHC 35.7  30.0 - 36.0 g/dL   RDW 12.9  11.5 - 15.5 %   Platelets 384  150 - 400 K/uL   Neutrophils Relative % 88 (*) 43 - 77 %   Lymphocytes Relative 7 (*) 12 - 46 %   Monocytes  Relative 5  3 - 12 %   Eosinophils Relative 0  0 - 5 %   Basophils Relative 0  0 - 1 %   Neutro Abs 22.6 (*) 1.7 - 7.7 K/uL   Lymphs Abs 1.8  0.7 - 4.0 K/uL   Monocytes Absolute 1.3 (*) 0.1 - 1.0 K/uL   Eosinophils Absolute 0.0  0.0 - 0.7 K/uL   Basophils Absolute 0.0  0.0 - 0.1 K/uL   WBC Morphology TOXIC GRANULATION    COMPREHENSIVE METABOLIC PANEL     Status: Abnormal   Collection Time    03/05/13 12:35 PM      Result Value Range   Sodium 131 (*) 137 - 147 mEq/L   Potassium 5.2  3.7 - 5.3 mEq/L   Comment: HEMOLYSIS AT THIS LEVEL MAY AFFECT RESULT   Chloride 97  96 - 112 mEq/L   CO2 18 (*) 19 - 32 mEq/L   Glucose, Bld 359 (*) 70 - 99 mg/dL   BUN 28 (*) 6 - 23 mg/dL   Creatinine, Ser 1.79 (*) 0.50 - 1.10 mg/dL   Calcium 8.7  8.4 - 10.5 mg/dL   Total Protein 7.2  6.0 - 8.3 g/dL   Albumin 2.4 (*) 3.5 - 5.2 g/dL   AST 37  0 - 37 U/L   Comment: HEMOLYSIS AT THIS LEVEL MAY AFFECT RESULT   ALT 21  0 - 35 U/L   Comment: HEMOLYSIS AT THIS LEVEL MAY AFFECT RESULT   Alkaline Phosphatase 122 (*) 39 - 117 U/L   Comment: HEMOLYSIS AT THIS LEVEL MAY AFFECT RESULT   Total Bilirubin 0.3  0.3 - 1.2 mg/dL   GFR calc non Af Amer 31 (*) >90 mL/min   GFR calc Af Amer 36 (*) >90 mL/min   Comment: (NOTE)     The eGFR has been calculated using the CKD EPI equation.     This calculation has not been validated in all clinical situations.     eGFR's persistently <90 mL/min signify possible Chronic Kidney     Disease.  LACTIC ACID, PLASMA     Status: None   Collection Time    03/05/13 12:35 PM      Result Value Range   Lactic Acid, Venous 1.6  0.5 - 2.2 mmol/L  PRO B NATRIURETIC PEPTIDE     Status: Abnormal   Collection Time    03/05/13  2:55 PM      Result Value Range   Pro B  Natriuretic peptide (BNP) 443.4 (*) 0 - 125 pg/mL  CK     Status: None   Collection Time    03/05/13  2:55 PM      Result Value Range   Total CK 70  7 - 177 U/L  TROPONIN I     Status: None   Collection Time     03/05/13  2:55 PM      Result Value Range   Troponin I <0.30  <0.30 ng/mL   Comment:            Due to the release kinetics of cTnI,     a negative result within the first hours     of the onset of symptoms does not rule out     myocardial infarction with certainty.     If myocardial infarction is still suspected,     repeat the test at appropriate intervals.    Ct Pelvis Wo Contrast  03/05/2013   CLINICAL DATA:  Buttock infection  EXAM: CT PELVIS WITHOUT CONTRAST  TECHNIQUE: Multidetector CT imaging of the pelvis was performed following the standard protocol without intravenous contrast.  COMPARISON:  None.  FINDINGS: There are multiple coal less in areas of fluid density within the subcutaneous fat of the left buttock. There is also stranding within the subcutaneous fat just under the dermis throughout the buttock region. The was also stranding extending to the anal region. The ischiorectal fossa is spared. The underlying musculature is unaffected.  No destructive bone lesion.  No acute fracture.  Stranding in the right lower quadrant retroperitoneal fat and to a lesser degree, medial to the descending colon on the left is partially imaged and nonspecific.  Bladder, uterus, and adnexa are unremarkable.  No evidence of free fluid.  Left iliac artery stent is noted.  1.8 x 5.1 cm mass in the left inguinal region is worrisome for enlarged lymph node or possibly focal hemorrhage. There is hyperdense stranding in the subcutaneous fat of the left inguinal region extending to the left common femoral artery worrisome for a post angiogram hematoma. This measures 5.0 x 2.2 cm.  IMPRESSION: There are multiple coal less than fluid density areas within the subcutaneous fat of the left buttock. Abscesses cannot be excluded. Contrast-enhanced study is recommended. There is associated stranding in the fat extending to the anus compatible with cellulitis.  There is focal hemorrhage in the left inguinal region most  likely due to a post angiogram hematoma. An adjacent soft tissue mass is either an enlarged lymph node or an additional small hemorrhage.   Electronically Signed   By: Maryclare Bean M.D.   On: 03/05/2013 15:01   Dg Chest Portable 1 View  03/05/2013   CLINICAL DATA:  Cellulitis.  EXAM: PORTABLE CHEST - 1 VIEW  COMPARISON:  DG CHEST 1V PORT dated 02/22/2012; DG THORACOLUMBAR SPINE dated 09/23/2008; DG CHEST 2 VIEW dated 01/16/2008; DG CHEST 1V PORT dated 02/04/2007; DG CHEST 1V PORT dated 02/05/2007  FINDINGS: Cardiac silhouette mildly to moderately enlarged for the AP portable technique, unchanged. Interval development of mild interstitial pulmonary edema since the examination almost 2 weeks ago, superimposed upon mild chronic bronchitic changes. Lungs otherwise clear. No localized airspace consolidation. No pleural effusions. No pneumothorax.  IMPRESSION: Mild CHF, with interval development of mild diffuse interstitial pulmonary edema since the examination on this 2 weeks ago.   Electronically Signed   By: Evangeline Dakin M.D.   On: 03/05/2013 12:18    Review of Systems  Constitutional:  Positive for fever, chills and malaise/fatigue. Negative for weight loss and diaphoresis.  Respiratory: Positive for shortness of breath. Negative for cough, hemoptysis and wheezing.   Cardiovascular: Negative for chest pain, palpitations and leg swelling.  Gastrointestinal: Negative for nausea, vomiting, abdominal pain, diarrhea, constipation, blood in stool and melena.  Genitourinary: Negative for dysuria, urgency and hematuria.  Neurological: Positive for weakness. Negative for dizziness, tingling, tremors, sensory change, speech change, focal weakness, seizures and loss of consciousness.   Blood pressure 113/53, pulse 110, temperature 98.6 F (37 C), temperature source Oral, resp. rate 19, SpO2 95.00%. Physical Exam  Constitutional: She is oriented to person, place, and time. She appears well-developed and well-nourished.  No distress.  HENT:  Head: Normocephalic and atraumatic.  Neck: Normal range of motion. Neck supple.  Cardiovascular: Normal rate, regular rhythm, normal heart sounds and intact distal pulses.  Exam reveals no gallop and no friction rub.   No murmur heard. Respiratory: Effort normal and breath sounds normal.  diminished breath sounds  GI: Soft. Bowel sounds are normal. She exhibits no distension and no mass. There is no tenderness. There is no rebound and no guarding.  Diastasis rectus   Musculoskeletal: Normal range of motion. She exhibits no edema.  Lymphadenopathy:    She has no cervical adenopathy.  Neurological: She is alert and oriented to person, place, and time.  Skin: She is not diaphoretic.  8x8cm area of induration, erythema and necrosis to left gluteal region.  There was purulent drainage noted on the pad.    Psychiatric: She has a normal mood and affect. Her behavior is normal. Judgment and thought content normal.    Assessment/Plan: CHF COPD OSA Obesity Leukocytosis AKI PAD Diabetes Mellitus   Gluteal abscess: she will need to go to OR for incision and drainage this evening.  Agree with vanc and zosyn.  Keep the patient NPO.  Obtain a EKG.  We will obtain cultures in the OR.  Obtain consent.    Timiko Offutt ANP-BC 03/05/2013, 4:08 PM

## 2013-03-05 NOTE — H&P (Signed)
[facility] History and Physical  Stephanie Peck XTG:[id number] DOB: 1960-10-06 DOA: 03/05/2013   PCP: Maurice Small, D, MD   Chief Complaint: Gluteal pain  HPI:  53 year old female with a history of diabetes mellitus, CHF, sleep apnea, depression presents with four-day history of increasing pain and erythema on her left buttock. The patient felt that it started as a boil that has gradually worsened over the past 4 days. She has had subjective fevers and chills. The patient went to see her primary care provider on 03/02/2013. The patient was placed on cephalexin and Bactrim DS. There was no improvement. She followed up at her PCP office today. Her PCP advised her to come to the ED. She was brought by her nephew. The patient has also been complaining of shortness of breath for the past 2 days, particularly with exertion. She denies any coughing, hemoptysis, chest discomfort. She notes leg edema, but states that this has not worsened. She has noted polydipsia and polyuria without any dysuria. The patient endorses a history of CHF, but states that she only takes furosemide when necessary. She states that she has been weighing herself regularly, and she states that her weight has not changed over the last several weeks. She'll not wear his oxygen, 2 L at nighttime.  In the ED, the patient was noted to have hypoxemia with oxygen saturation of 88% on room air. She improved to 94% on 2 L. The patient was given 500 cc bolus of normal saline. Her blood pressure was initially soft. It improved to 111/58. She was started on vancomycin and Zosyn. Chest x-ray showed increased interstitial markings. WBC was 25.7. Serum creatinine 1.79. Potassium was 5.2. Assessment/Plan: Sepsis -Secondary to gluteal abscess -IV vancomycin and zosyn empirically -blood cultures x 2 -UA with reflex to urine culture -CT pelvis revealed-Multiple fluid collections in the left buttock with 1.8x5.1 cm mass in the left inguinal  region -No signs of bleeding or hematoma on exam; no clinical history of recent procedure--> likely correlates with lymphadenopathy which was palpable on exam vs muscle transposition 1 yr ago -I have consulted  General surgery for abscess Acute on chronic CHF  -Clinically the patient is fluid overloaded  -We'll give 1 dose of IV furosemide  -Given the patient's soft blood pressure from her sepsis--reevaluate the patient's fluid and pulmonary status prior to continued scheduled lasix dosing -suspect she will be able to tolerate additional dosing as infection and sepsis is treated and BP improves -she is currently not in any respiratory distress -Echocardiogram  -Daily weights  -EKG  -ProBNP  Diabetes mellitus type 2  -Hemoglobin A1c  -Give half home dose of Levemir  -NovoLog sliding scale  -hold Januvia and metformin AKI -pt had serum creatinine 1.20 on 12/09/12 -Monitor closely with diuresis -Urinalysis  -Hold losartan HTN -hold all anti-HTN meds due to soft BP PAD -s/p L-external iliac and L-SFA stents -continue ASA       Past Medical History  Diagnosis Date  . Diabetes mellitus   . GERD (gastroesophageal reflux disease)   . Anxiety   . CHF (congestive heart failure)   . COPD (chronic obstructive pulmonary disease)   . Sleep apnea     severe OSA by 09/08/06 sleep study ([facility])  . Depression   . Morbid obesity    Past Surgical History  Procedure Laterality Date  . Cholecystectomy    . Gastric bypass  2006  . Tonsillectomy    . Femoral artery stent  12/05/2011  right superficial   . Endarterectomy femoral  02/22/2012    Procedure: ENDARTERECTOMY FEMORAL;  Surgeon: Serafina Mitchell, MD;  Location: [place];  Service: Vascular;  Laterality: Left;  . Patch angioplasty  02/22/2012    Procedure: PATCH ANGIOPLASTY;  Surgeon: Serafina Mitchell, MD;  Location: [facility] OR;  Service: Vascular;  Laterality: Left;  left femoral patch angioplasty  . Application of wound vac  02/22/2012     Procedure: APPLICATION OF WOUND VAC;  Surgeon: Serafina Mitchell, MD;  Location: [place];  Service: Vascular;  Laterality: Left;  application wound vac  . Groin debridement  03/01/2012    Procedure: GROIN DEBRIDEMENT;  Surgeon: Rosetta Posner, MD;  Location: [place];  Service: Vascular;  Laterality: Left;  . Aortogram Left 04/04/2012    Procedure: AORTOGRAM;  Surgeon: Serafina Mitchell, MD;  Location: [place];  Service: Vascular;  Laterality: Left;  . Patch angioplasty Left 04/04/2012    Procedure: PATCH ANGIOPLASTY;  Surgeon: Serafina Mitchell, MD;  Location: [place];  Service: Vascular;  Laterality: Left;  Marland Kitchen Muscle flap closure Left 04/04/2012    Procedure: MUSCLE FLAP CLOSURE;  Surgeon: Theodoro Kos, DO;  Location: [place];  Service: Plastics;  Laterality: Left;   Social History:  reports that she quit smoking about 16 months ago. Her smoking use included Cigarettes. She started smoking about 17 months ago. She has a 40 pack-year smoking history. She has never used smokeless tobacco. She reports that she does not drink alcohol or use illicit drugs.   Family History  Problem Relation Age of Onset  . Cancer Mother   . Depression Mother   . Heart disease Father     before age 32  . Diabetes Father   . Hypertension Father   . Hyperlipidemia Father   . Heart attack Father   . Cancer Maternal Grandmother      Allergies  Allergen Reactions  . Celebrex [Celecoxib] Shortness Of Breath  . Codeine Nausea Only      Prior to Admission medications   Medication Sig Start Date End Date Taking? Authorizing Provider  albuterol (PROVENTIL HFA;VENTOLIN HFA) 108 (90 BASE) MCG/ACT inhaler Inhale 2 puffs into the lungs every 6 (six) hours as needed. For wheezing and shortness of breath.   Yes Historical Provider, MD  albuterol (PROVENTIL) (5 MG/ML) 0.5% nebulizer solution Take 2.5 mg by nebulization every 6 (six) hours as needed. For wheezing and shortness of breath.   Yes Historical Provider, MD  aspirin EC 325 MG  tablet Take 325 mg by mouth daily.   Yes Historical Provider, MD  BD PEN NEEDLE NANO U/F 32G X 4 MM MISC  02/02/13  Yes Historical Provider, MD  buPROPion (WELLBUTRIN XL) 300 MG 24 hr tablet Take 300 mg by mouth daily.   Yes Historical Provider, MD  calcium carbonate (OS-CAL) 600 MG TABS Take 600 mg by mouth daily with breakfast.    Yes Historical Provider, MD  cephALEXin (KEFLEX) 500 MG capsule Take 500 mg by mouth 2 (two) times daily.   Yes Historical Provider, MD  Cyanocobalamin (VITAMIN B 12 PO) Take 250 mg by mouth daily.   Yes Historical Provider, MD  DULoxetine (CYMBALTA) 60 MG capsule Take 60 mg by mouth daily.   Yes Historical Provider, MD  ferrous fumarate (HEMOCYTE - 106 MG FE) 325 (106 FE) MG TABS Take 1 tablet by mouth.   Yes Historical Provider, MD  gabapentin (NEURONTIN) 600 MG tablet Take 600 mg by mouth 3 (  three) times daily.   Yes Historical Provider, MD  Glucose Blood (BAYER BREEZE 2 TEST) DISK 1 each 3 (three) times daily.   Yes Historical Provider, MD  indomethacin (INDOCIN) 50 MG capsule Take 50 mg by mouth 2 (two) times daily as needed (for gout).   Yes Historical Provider, MD  insulin detemir (LEVEMIR) 100 UNIT/ML injection Inject 48 Units into the skin daily.   Yes Historical Provider, MD  lansoprazole (PREVACID) 30 MG capsule Take 30 mg by mouth 2 (two) times daily.   Yes Historical Provider, MD  losartan (COZAAR) 50 MG tablet Take 50 mg by mouth daily.   Yes Historical Provider, MD  Multiple Vitamin (MULTIVITAMIN) tablet Take 1 tablet by mouth daily.   Yes Historical Provider, MD  omeprazole (PRILOSEC) 40 MG capsule Take 40 mg by mouth 2 (two) times daily.   Yes Historical Provider, MD  oxyCODONE-acetaminophen (PERCOCET/ROXICET) 5-325 MG per tablet Take 1 tablet by mouth every 6 (six) hours as needed for severe pain.   Yes Historical Provider, MD  promethazine (PHENERGAN) 25 MG tablet Take 50 mg by mouth every 6 (six) hours as needed for nausea. For nausea   Yes Historical  Provider, MD  simvastatin (ZOCOR) 20 MG tablet Take 20 mg by mouth every evening.   Yes Historical Provider, MD  sitaGLIPtin (JANUVIA) 50 MG tablet Take 50 mg by mouth daily.   Yes Historical Provider, MD  sitaGLIPtin-metformin (JANUMET) 50-1000 MG per tablet Take 1 tablet by mouth 2 (two) times daily with a meal.   Yes Historical Provider, MD  sulfamethoxazole-trimethoprim (BACTRIM DS) 800-160 MG per tablet Take 1 tablet by mouth 2 (two) times daily. 03/03/13  Yes Historical Provider, MD  topiramate (TOPAMAX) 50 MG tablet Take 50 mg by mouth daily.    Yes Historical Provider, MD  traMADol (ULTRAM) 50 MG tablet Take 50 mg by mouth every 6 (six) hours as needed for moderate pain.   Yes Historical Provider, MD    Review of Systems:  Constitutional:  No weight loss, night sweats,  Head&Eyes: No headache.  No vision loss.  No eye pain or scotoma ENT:  No Difficulty swallowing,Tooth/dental problems,Sore throat,  No ear ache, post nasal drip,  Cardio-vascular:  No chest pain, Orthopnea, PND, palpitations  GI:  No  abdominal pain, nausea, vomiting, diarrhea, loss of appetite, hematochezia, melena, heartburn, indigestion, Resp:   No cough. No coughing up of blood .No wheezing.No chest wall deformity  Skin:  Left patella tenderness and erythema GU:  no dysuria, change in color of urine, no urgency or frequency. No flank pain.  Musculoskeletal:  No joint pain or swelling. No decreased range of motion. No back pain.  Psych:  No change in mood or affect. No depression or anxiety. Neurologic: No headache, no dysesthesia, no focal weakness, no vision loss. No syncope  Physical Exam: Filed Vitals:   03/05/13 1349 03/05/13 1400 03/05/13 1453 03/05/13 1455  BP:  111/58 113/53   Pulse:  110    Temp: 99.2 F (37.3 C)  98.6 F (37 C)   TempSrc: Oral  Oral   Resp:   19   SpO2:  88% 92% 95%   General:  A&O x 3, NAD,pleasant/cooperative Head/Eye: No conjunctival hemorrhage, no icterus, [place]/AT,  No nystagmus ENT:  No icterus,  No thrush, good dentition, no pharyngeal exudate Neck:  No masses, no lymphadenpathy; +JVD CV:  RRR, no rub, no gallop, no S3 Lung: Bilateral crackles, left greater than right. Abdomen: soft/NT, +BS, nondistended, no  peritoneal signs Ext: No cyanosis, No rashes, No petechiae, No lymphangitis, 1+ R>LE edema; shotty left inguinal adenopathy; left gluteal fold with approximately 4 x 4 centimeters area of induration and fluctuation of the central necrosis. There is circumscribed erythema extending approximately 10 cm laterally from the wound. No crepitance.   Labs on Admission:  Basic Metabolic Panel:  Recent Labs Lab 03/05/13 1235  NA 131*  K 5.2  CL 97  CO2 18*  GLUCOSE 359*  BUN 28*  CREATININE 1.79*  CALCIUM 8.7   Liver Function Tests:  Recent Labs Lab 03/05/13 1235  AST 37  ALT 21  ALKPHOS 122*  BILITOT 0.3  PROT 7.2  ALBUMIN 2.4*   No results found for this basename: LIPASE, AMYLASE,  in the last 168 hours No results found for this basename: AMMONIA,  in the last 168 hours CBC:  Recent Labs Lab 03/05/13 1235  WBC 25.7*  NEUTROABS 22.6*  HGB 13.0  HCT 36.4  MCV 93.8  PLT 384   Cardiac Enzymes: No results found for this basename: CKTOTAL, CKMB, CKMBINDEX, TROPONINI,  in the last 168 hours BNP: No components found with this basename: POCBNP,  CBG: No results found for this basename: GLUCAP,  in the last 168 hours  Radiological Exams on Admission: Ct Pelvis Wo Contrast  03/05/2013   CLINICAL DATA:  Buttock infection  EXAM: CT PELVIS WITHOUT CONTRAST  TECHNIQUE: Multidetector CT imaging of the pelvis was performed following the standard protocol without intravenous contrast.  COMPARISON:  None.  FINDINGS: There are multiple coal less in areas of fluid density within the subcutaneous fat of the left buttock. There is also stranding within the subcutaneous fat just under the dermis throughout the buttock region. The was also  stranding extending to the anal region. The ischiorectal fossa is spared. The underlying musculature is unaffected.  No destructive bone lesion.  No acute fracture.  Stranding in the right lower quadrant retroperitoneal fat and to a lesser degree, medial to the descending colon on the left is partially imaged and nonspecific.  Bladder, uterus, and adnexa are unremarkable.  No evidence of free fluid.  Left iliac artery stent is noted.  1.8 x 5.1 cm mass in the left inguinal region is worrisome for enlarged lymph node or possibly focal hemorrhage. There is hyperdense stranding in the subcutaneous fat of the left inguinal region extending to the left common femoral artery worrisome for a post angiogram hematoma. This measures 5.0 x 2.2 cm.  IMPRESSION: There are multiple coal less than fluid density areas within the subcutaneous fat of the left buttock. Abscesses cannot be excluded. Contrast-enhanced study is recommended. There is associated stranding in the fat extending to the anus compatible with cellulitis.  There is focal hemorrhage in the left inguinal region most likely due to a post angiogram hematoma. An adjacent soft tissue mass is either an enlarged lymph node or an additional small hemorrhage.   Electronically Signed   By: Maryclare Bean M.D.   On: 03/05/2013 15:01   Dg Chest Portable 1 View  03/05/2013   CLINICAL DATA:  Cellulitis.  EXAM: PORTABLE CHEST - 1 VIEW  COMPARISON:  DG CHEST 1V PORT dated 02/22/2012; DG THORACOLUMBAR SPINE dated 09/23/2008; DG CHEST 2 VIEW dated 01/16/2008; DG CHEST 1V PORT dated 02/04/2007; DG CHEST 1V PORT dated 02/05/2007  FINDINGS: Cardiac silhouette mildly to moderately enlarged for the AP portable technique, unchanged. Interval development of mild interstitial pulmonary edema since the examination almost 2 weeks ago, superimposed upon  mild chronic bronchitic changes. Lungs otherwise clear. No localized airspace consolidation. No pleural effusions. No pneumothorax.  IMPRESSION: Mild  CHF, with interval development of mild diffuse interstitial pulmonary edema since the examination on this 2 weeks ago.   Electronically Signed   By: Evangeline Dakin M.D.   On: 03/05/2013 12:18    EKG: Independently reviewed. Pending    Time spent:70 minutes Code Status:   FULL Family Communication:   No Family at bedside   Odessa Morren, DO  [facility] Pager [phone]  If 7PM-7AM, please contact night-coverage [email or web address] Password [email or web address] 03/05/2013, 3:12 PM

## 2013-03-05 NOTE — Progress Notes (Signed)
Patient ID: Stephanie Peck, female   DOB: February 18, 1960, 53 y.o.   MRN: [id number] Patient seen and I agree with the assessment and plan by Dr. Donne Hazel. Will proceed to OR for I&D perirectal abscess. Procedure/risks/benefits have been explained. Consent done. On Vanc/Zosyn. Georganna Skeans, MD, MPH, FACS Pager: [phone]

## 2013-03-05 NOTE — Consult Note (Signed)
Name: Stephanie Peck MRN: [id number] DOB: [birth year]    ADMISSION DATE:  03/05/2013 CONSULTATION DATE:  03/05/13  REFERRING MD :  [facility]-->PCCM (2/5)  CHIEF COMPLAINT:  Acute Respiratory Failure / Perirectal abscess & Sepsis  BRIEF PATIENT DESCRIPTION: [name] admitted on 2/5  SIGNIFICANT EVENTS / STUDIES:  2/5 - Admit with 4 day hx of worsening abscess on L buttock, fevers, chills.  To OR per [facility].  Returned to ICU on vent.  2/5 - CT Pelvis: multiple fluid dense areas within the subq fat of left buttock, stranding in the fat extending to anus compatible with cellulitis, and focal hemorrhage in left inguinal region, adjacent soft tissue mass (?lymph node vs additional small hemorrhage).   LINES / TUBES: OETT 2/5>>>  CULTURES: BCx2 2/5>>> Wound Culture 2/5>>> Flu 2/5>>>  ANTIBIOTICS: Vanco 2/5>>> Zosyn 2/5>>>  HISTORY OF PRESENT ILLNESS:  [name] with PMH of DM, GERD, Anxiety, COPD, Severe OSA, COPD, Morbid Obesity who presented to [facility] ER on 2/5 with a 4 day history of worsening left buttock abscess associated with fevers, chills, and increasing shortness of breath.  She was seen by her PCP on 2/2 and treated with cephalexin & bactrim DS without improvement.  She followed up in the office on 2/5 and was referred to ER for for further evaluation.    ER work up noted the patient to be hypoxemic on room air with sats in upper 80's that improved with O2, hypotensive, WBC of 26k, K 5.2, fever of 101, Na 131, glucose of 359 and sr cr of 1.79. Patient was admitted for sepsis per [facility].  CT of pelvis demonstrated multiple fluid dense areas within the subq fat of left buttock, stranding in the fat extending to anus compatible with cellulitis, and focal hemorrhage in left inguinal region, adjacent soft tissue mass (?lymph node vs additional small hemorrhage).  She was evaluated by Dr. Donne Hazel and he felt left groin findings were from prior surgery with flap coverage of graft.  Patient was taken to OR  for I&D of abscess.  Returned to ICU post procedure on mechanical ventilation.  PCCM consulted for ICU management.   PAST MEDICAL HISTORY :  Past Medical History  Diagnosis Date  . Diabetes mellitus   . GERD (gastroesophageal reflux disease)   . Anxiety   . CHF (congestive heart failure)   . COPD (chronic obstructive pulmonary disease)   . Sleep apnea     severe OSA by 09/08/06 sleep study ([facility])  . Depression   . Morbid obesity    Past Surgical History  Procedure Laterality Date  . Cholecystectomy    . Gastric bypass  2006  . Tonsillectomy    . Femoral artery stent  12/05/2011    right superficial   . Endarterectomy femoral  02/22/2012    Procedure: ENDARTERECTOMY FEMORAL;  Surgeon: Serafina Mitchell, MD;  Location: [place];  Service: Vascular;  Laterality: Left;  . Patch angioplasty  02/22/2012    Procedure: PATCH ANGIOPLASTY;  Surgeon: Serafina Mitchell, MD;  Location: [facility] OR;  Service: Vascular;  Laterality: Left;  left femoral patch angioplasty  . Application of wound vac  02/22/2012    Procedure: APPLICATION OF WOUND VAC;  Surgeon: Serafina Mitchell, MD;  Location: [place];  Service: Vascular;  Laterality: Left;  application wound vac  . Groin debridement  03/01/2012    Procedure: GROIN DEBRIDEMENT;  Surgeon: Rosetta Posner, MD;  Location: [place];  Service: Vascular;  Laterality: Left;  . Aortogram Left 04/04/2012    Procedure: AORTOGRAM;  Surgeon: Serafina Mitchell, MD;  Location: [place];  Service: Vascular;  Laterality: Left;  . Patch angioplasty Left 04/04/2012    Procedure: PATCH ANGIOPLASTY;  Surgeon: Serafina Mitchell, MD;  Location: [place];  Service: Vascular;  Laterality: Left;  Marland Kitchen Muscle flap closure Left 04/04/2012    Procedure: MUSCLE FLAP CLOSURE;  Surgeon: Theodoro Kos, DO;  Location: [place];  Service: Plastics;  Laterality: Left;   Prior to Admission medications   Medication Sig Start Date End Date Taking? Authorizing Provider  albuterol (PROVENTIL HFA;VENTOLIN HFA) 108 (90 BASE) MCG/ACT  inhaler Inhale 2 puffs into the lungs every 6 (six) hours as needed. For wheezing and shortness of breath.   Yes Historical Provider, MD  albuterol (PROVENTIL) (5 MG/ML) 0.5% nebulizer solution Take 2.5 mg by nebulization every 6 (six) hours as needed. For wheezing and shortness of breath.   Yes Historical Provider, MD  aspirin EC 325 MG tablet Take 325 mg by mouth daily.   Yes Historical Provider, MD  BD PEN NEEDLE NANO U/F 32G X 4 MM MISC  02/02/13  Yes Historical Provider, MD  buPROPion (WELLBUTRIN XL) 300 MG 24 hr tablet Take 300 mg by mouth daily.   Yes Historical Provider, MD  calcium carbonate (OS-CAL) 600 MG TABS Take 600 mg by mouth daily with breakfast.    Yes Historical Provider, MD  cephALEXin (KEFLEX) 500 MG capsule Take 500 mg by mouth 2 (two) times daily.   Yes Historical Provider, MD  Cyanocobalamin (VITAMIN B 12 PO) Take 250 mg by mouth daily.   Yes Historical Provider, MD  DULoxetine (CYMBALTA) 60 MG capsule Take 60 mg by mouth daily.   Yes Historical Provider, MD  ferrous fumarate (HEMOCYTE - 106 MG FE) 325 (106 FE) MG TABS Take 1 tablet by mouth.   Yes Historical Provider, MD  gabapentin (NEURONTIN) 600 MG tablet Take 600 mg by mouth 3 (three) times daily.   Yes Historical Provider, MD  Glucose Blood (BAYER BREEZE 2 TEST) DISK 1 each 3 (three) times daily.   Yes Historical Provider, MD  indomethacin (INDOCIN) 50 MG capsule Take 50 mg by mouth 2 (two) times daily as needed (for gout).   Yes Historical Provider, MD  insulin detemir (LEVEMIR) 100 UNIT/ML injection Inject 48 Units into the skin daily.   Yes Historical Provider, MD  lansoprazole (PREVACID) 30 MG capsule Take 30 mg by mouth 2 (two) times daily.   Yes Historical Provider, MD  losartan (COZAAR) 50 MG tablet Take 50 mg by mouth daily.   Yes Historical Provider, MD  Multiple Vitamin (MULTIVITAMIN) tablet Take 1 tablet by mouth daily.   Yes Historical Provider, MD  omeprazole (PRILOSEC) 40 MG capsule Take 40 mg by mouth 2  (two) times daily.   Yes Historical Provider, MD  oxyCODONE-acetaminophen (PERCOCET/ROXICET) 5-325 MG per tablet Take 1 tablet by mouth every 6 (six) hours as needed for severe pain.   Yes Historical Provider, MD  promethazine (PHENERGAN) 25 MG tablet Take 50 mg by mouth every 6 (six) hours as needed for nausea. For nausea   Yes Historical Provider, MD  simvastatin (ZOCOR) 20 MG tablet Take 20 mg by mouth every evening.   Yes Historical Provider, MD  sitaGLIPtin (JANUVIA) 50 MG tablet Take 50 mg by mouth daily.   Yes Historical Provider, MD  sitaGLIPtin-metformin (JANUMET) 50-1000 MG per tablet Take 1 tablet by mouth 2 (two) times daily with  a meal.   Yes Historical Provider, MD  sulfamethoxazole-trimethoprim (BACTRIM DS) 800-160 MG per tablet Take 1 tablet by mouth 2 (two) times daily. 03/03/13  Yes Historical Provider, MD  topiramate (TOPAMAX) 50 MG tablet Take 50 mg by mouth daily.    Yes Historical Provider, MD  traMADol (ULTRAM) 50 MG tablet Take 50 mg by mouth every 6 (six) hours as needed for moderate pain.   Yes Historical Provider, MD   Allergies  Allergen Reactions  . Celebrex [Celecoxib] Shortness Of Breath  . Codeine Nausea Only    FAMILY HISTORY:  Family History  Problem Relation Age of Onset  . Cancer Mother   . Depression Mother   . Heart disease Father     before age 60  . Diabetes Father   . Hypertension Father   . Hyperlipidemia Father   . Heart attack Father   . Cancer Maternal Grandmother    SOCIAL HISTORY:  reports that she quit smoking about 16 months ago. Her smoking use included Cigarettes. She started smoking about 17 months ago. She has a 40 pack-year smoking history. She has never used smokeless tobacco. She reports that she does not drink alcohol or use illicit drugs.  REVIEW OF SYSTEMS:  Unable to complete as pt is altered on vent.   SUBJECTIVE:   VITAL SIGNS: Temp:  [98 F (36.7 C)-101 F (38.3 C)] 98.8 F (37.1 C) (02/05 1932) Pulse Rate:   [103-112] 108 (02/05 1932) Resp:  [18-25] 25 (02/05 1932) BP: (91-136)/(42-68) 136/61 mmHg (02/05 1932) SpO2:  [88 %-99 %] 92 % (02/05 1932) Weight:  [276 lb 3.8 oz (125.3 kg)] 276 lb 3.8 oz (125.3 kg) (02/05 1620)  HEMODYNAMICS:    VENTILATOR SETTINGS:    INTAKE / OUTPUT: Intake/Output     02/05 0701 - 02/06 0700   I.V. (mL/kg) 500 (4)   IV Piggyback 200   Total Intake(mL/kg) 700 (5.6)   Urine (mL/kg/hr) 325   Blood 100   Total Output 425   Net +275         PHYSICAL EXAMINATION: General:  Morbidly obese female in NAD on vent Neuro:  Awakens, nods appropriately, follows commands HEENT:  Mm pink/moist, OETT, short / thick neck Cardiovascular:  s1s2 rrr, no m/r/g, distant tones Lungs:  resp's even/non-labored on vent, R lung with scattered wheezes Abdomen:  Obese, soft, bsx4 active Musculoskeletal:  No acute deformties Skin:  Warm/dry, L buttock dressing c/d/i, mild bloody drainage, left labia with raised erythematous area concerning for abscess  LABS:  CBC  Recent Labs Lab 03/05/13 1235  WBC 25.7*  HGB 13.0  HCT 36.4  PLT 384   Coag's No results found for this basename: APTT, INR,  in the last 168 hours BMET  Recent Labs Lab 03/05/13 1235  NA 131*  K 5.2  CL 97  CO2 18*  BUN 28*  CREATININE 1.79*  GLUCOSE 359*   Electrolytes  Recent Labs Lab 03/05/13 1235  CALCIUM 8.7   Sepsis Markers  Recent Labs Lab 03/05/13 1235  LATICACIDVEN 1.6   ABG No results found for this basename: PHART, PCO2ART, PO2ART,  in the last 168 hours Liver Enzymes  Recent Labs Lab 03/05/13 1235  AST 37  ALT 21  ALKPHOS 122*  BILITOT 0.3  ALBUMIN 2.4*   Cardiac Enzymes  Recent Labs Lab 03/05/13 1455  TROPONINI <0.30  PROBNP 443.4*   Glucose No results found for this basename: GLUCAP,  in the last 168 hours  Imaging Ct Pelvis  Wo Contrast  03/05/2013   CLINICAL DATA:  Buttock infection  EXAM: CT PELVIS WITHOUT CONTRAST  TECHNIQUE: Multidetector CT  imaging of the pelvis was performed following the standard protocol without intravenous contrast.  COMPARISON:  None.  FINDINGS: There are multiple coal less in areas of fluid density within the subcutaneous fat of the left buttock. There is also stranding within the subcutaneous fat just under the dermis throughout the buttock region. The was also stranding extending to the anal region. The ischiorectal fossa is spared. The underlying musculature is unaffected.  No destructive bone lesion.  No acute fracture.  Stranding in the right lower quadrant retroperitoneal fat and to a lesser degree, medial to the descending colon on the left is partially imaged and nonspecific.  Bladder, uterus, and adnexa are unremarkable.  No evidence of free fluid.  Left iliac artery stent is noted.  1.8 x 5.1 cm mass in the left inguinal region is worrisome for enlarged lymph node or possibly focal hemorrhage. There is hyperdense stranding in the subcutaneous fat of the left inguinal region extending to the left common femoral artery worrisome for a post angiogram hematoma. This measures 5.0 x 2.2 cm.  IMPRESSION: There are multiple coal less than fluid density areas within the subcutaneous fat of the left buttock. Abscesses cannot be excluded. Contrast-enhanced study is recommended. There is associated stranding in the fat extending to the anus compatible with cellulitis.  There is focal hemorrhage in the left inguinal region most likely due to a post angiogram hematoma. An adjacent soft tissue mass is either an enlarged lymph node or an additional small hemorrhage.   Electronically Signed   By: Maryclare Bean M.D.   On: 03/05/2013 15:01   Dg Chest Portable 1 View  03/05/2013   CLINICAL DATA:  Cellulitis.  EXAM: PORTABLE CHEST - 1 VIEW  COMPARISON:  DG CHEST 1V PORT dated 02/22/2012; DG THORACOLUMBAR SPINE dated 09/23/2008; DG CHEST 2 VIEW dated 01/16/2008; DG CHEST 1V PORT dated 02/04/2007; DG CHEST 1V PORT dated 02/05/2007  FINDINGS: Cardiac  silhouette mildly to moderately enlarged for the AP portable technique, unchanged. Interval development of mild interstitial pulmonary edema since the examination almost 2 weeks ago, superimposed upon mild chronic bronchitic changes. Lungs otherwise clear. No localized airspace consolidation. No pleural effusions. No pneumothorax.  IMPRESSION: Mild CHF, with interval development of mild diffuse interstitial pulmonary edema since the examination on this 2 weeks ago.   Electronically Signed   By: Evangeline Dakin M.D.   On: 03/05/2013 12:18    ASSESSMENT / PLAN:  PULMONARY A: Acute Respiratory Failure At Risk ALI COPD OSA P:   -full vent support overnight, 8cc/kg -f/u abg, CXR post surgery -trend CXR  -PRN albuterol -s/p lasix x1 in OR -am SBT / WUA -will need CPAP QHS post extubation   CARDIOVASCULAR A:  CHF P:  -cycle enzymes -assess EKG post-op  -continue ASA -hold home cozaar  RENAL A:   Acute Kidney Injury - in setting of sepsis Hyperkalemia P:   -repeat BMP now -bolus NS now, then 125cc/hr -hold nephrotoxic agents  GASTROINTESTINAL A:   GERD Hx Gastric Bypass P:   -protonix while intubated, not on PPI as outpt -hold NGT as uncertain extent of bypass  HEMATOLOGIC A:   Chronic Anemia P:  -hold PO iron -SCD's / heparin for DVT  INFECTIOUS A:   Left Buttock Abscess / Perirectal Cellulitis- s/p I&D 2/5 P:   -abx & cultures as above  ENDOCRINE A:  Diabetes   Gout P:   -SSI with resistant scale -hold long acting for now -hold januvia, janumet, indocin  NEUROLOGIC A:   Pain  Acute Encephalopathy Depression / Anxiety P:   -Fentanyl gtt for pain -PRN versed for sedation needs -hold cymbalta, wellbutrin, neurontin, topamax  Noe Gens, NP-C [facility] Pgr: [phone] or [phone]   Attending:  I have seen and examined the patient with nurse practitioner/resident and agree with the note above.    I have personally  obtained a history, examined the patient, evaluated laboratory and imaging results, formulated the assessment and plan and placed orders.  CRITICAL CARE: The patient is critically ill with multiple organ systems failure and requires high complexity decision making for assessment and support, frequent evaluation and titration of therapies, application of advanced monitoring technologies and extensive interpretation of multiple databases. Critical Care Time devoted to patient care services described in this note is 45 minutes.   Jillyn Hidden PCCM Pager: [phone] Cell: [phone] If no response, call [phone]  03/05/2013, 9:38 PM

## 2013-03-05 NOTE — ED Notes (Signed)
Pt in c/o cellulitis to her buttocks area, seen at [facility] for same and given antibiotics, states area of redness has spread, states there are three small sores in the area, denies fever but c/o chills

## 2013-03-05 NOTE — Consult Note (Signed)
Ct findings left groin are from prior surgery with flap coverage of graft, she needs i and d of abscess tonight, discussed plan with patient, needs ekg prior also.

## 2013-03-05 NOTE — Preoperative (Signed)
Beta Blockers   Reason not to administer Beta Blockers:Not Applicable 

## 2013-03-05 NOTE — Progress Notes (Signed)
Pt sent to or, pt awake, oriented x4, consent signed, all pre-op workup done, vss, foley emptied, cbg 103. Elink and central monitoring aware of pt movement.

## 2013-03-05 NOTE — Anesthesia Postprocedure Evaluation (Signed)
  Anesthesia Post-op Note  Patient: Stephanie Peck  Procedure(s) Performed: Procedure(s): IRRIGATION AND DEBRIDEMENT ABSCESS (Left)  Patient Location: PACU and ICU  Anesthesia Type:General  Level of Consciousness: sedated  Airway and Oxygen Therapy: Patient remains intubated per anesthesia plan  Post-op Pain: mild  Post-op Assessment: Post-op Vital signs reviewed  Post-op Vital Signs: Reviewed  Complications: No apparent anesthesia complications

## 2013-03-05 NOTE — ED Notes (Signed)
Pt has two sores on left buttock. Both have necrotic lesions. Lower one measures 2cmX1cm, upper area measures 2cmx1.5cm. The area of reddness extends Up buttocks. Lesions have area that is harder to palpation that the rest of buttocks.

## 2013-03-05 NOTE — ED Provider Notes (Signed)
CSN: [id number]     Arrival date & time 03/05/13  [id number] History   First MD Initiated Contact with Patient 03/05/13 1100     Chief Complaint  Patient presents with  . Cellulitis   (Consider location/radiation/quality/duration/timing/severity/associated sxs/prior Treatment) Patient is a 53 y.o. female presenting with abscess. The history is provided by the patient. No language interpreter was used.  Abscess Location:  Ano-genital Ano-genital abscess location:  L buttock Abscess quality: fluctuance, induration, painful, redness, warmth and weeping   Red streaking: no   Progression:  Worsening Pain details:    Progression:  Worsening Chronicity:  New Ineffective treatments:  Oral antibiotics Associated symptoms: fatigue, fever and nausea   Associated symptoms: no vomiting   Associated symptoms comment:  She presents for evaluation of painful infection on the left buttock, developing over the last several days. She has seen her primary care physician and treated with Bactrim for the past 2 days but presents today with worsening pain over larger area of buttock, chills, nausea, shortness of breath, fever and weak. She is now having difficulty walking secondary to pain and weakness.  Risk factors: prior abscess     Past Medical History  Diagnosis Date  . Diabetes mellitus   . GERD (gastroesophageal reflux disease)   . Anxiety   . CHF (congestive heart failure)   . COPD (chronic obstructive pulmonary disease)   . Sleep apnea     severe OSA by 09/08/06 sleep study ([facility])  . Depression   . Morbid obesity    Past Surgical History  Procedure Laterality Date  . Cholecystectomy    . Gastric bypass  2006  . Tonsillectomy    . Femoral artery stent  12/05/2011    right superficial   . Endarterectomy femoral  02/22/2012    Procedure: ENDARTERECTOMY FEMORAL;  Surgeon: Serafina Mitchell, MD;  Location: [place];  Service: Vascular;  Laterality: Left;  . Patch angioplasty  02/22/2012    Procedure:  PATCH ANGIOPLASTY;  Surgeon: Serafina Mitchell, MD;  Location: [facility] OR;  Service: Vascular;  Laterality: Left;  left femoral patch angioplasty  . Application of wound vac  02/22/2012    Procedure: APPLICATION OF WOUND VAC;  Surgeon: Serafina Mitchell, MD;  Location: [place];  Service: Vascular;  Laterality: Left;  application wound vac  . Groin debridement  03/01/2012    Procedure: GROIN DEBRIDEMENT;  Surgeon: Rosetta Posner, MD;  Location: [place];  Service: Vascular;  Laterality: Left;  . Aortogram Left 04/04/2012    Procedure: AORTOGRAM;  Surgeon: Serafina Mitchell, MD;  Location: [place];  Service: Vascular;  Laterality: Left;  . Patch angioplasty Left 04/04/2012    Procedure: PATCH ANGIOPLASTY;  Surgeon: Serafina Mitchell, MD;  Location: [place];  Service: Vascular;  Laterality: Left;  Marland Kitchen Muscle flap closure Left 04/04/2012    Procedure: MUSCLE FLAP CLOSURE;  Surgeon: Theodoro Kos, DO;  Location: [place];  Service: Plastics;  Laterality: Left;   Family History  Problem Relation Age of Onset  . Cancer Mother   . Depression Mother   . Heart disease Father     before age 22  . Diabetes Father   . Hypertension Father   . Hyperlipidemia Father   . Heart attack Father   . Cancer Maternal Grandmother    History  Substance Use Topics  . Smoking status: Former Smoker -- 2.00 packs/day for 20 years    Types: Cigarettes    Start date: 09/30/2011  Quit date: 10/30/2011  . Smokeless tobacco: Never Used  . Alcohol Use: No   OB History   Grav Para Term Preterm Abortions TAB SAB Ect Mult Living                 Review of Systems  Constitutional: Positive for fever and fatigue. Negative for chills.  HENT: Negative.   Respiratory: Positive for cough and shortness of breath.   Cardiovascular: Negative.   Gastrointestinal: Positive for nausea. Negative for vomiting, diarrhea and blood in stool.  Genitourinary: Negative.  Negative for dysuria.  Musculoskeletal: Negative.   Skin: Positive for color change and wound.   Neurological: Positive for weakness.  Psychiatric/Behavioral: Negative for confusion.    Allergies  Celebrex and Codeine  Home Medications   Current Outpatient Rx  Name  Route  Sig  Dispense  Refill  . albuterol (PROVENTIL HFA;VENTOLIN HFA) 108 (90 BASE) MCG/ACT inhaler   Inhalation   Inhale 2 puffs into the lungs every 6 (six) hours as needed. For wheezing and shortness of breath.         Marland Kitchen albuterol (PROVENTIL) (5 MG/ML) 0.5% nebulizer solution   Nebulization   Take 2.5 mg by nebulization every 6 (six) hours as needed. For wheezing and shortness of breath.         Marland Kitchen aspirin EC 325 MG tablet   Oral   Take 325 mg by mouth daily.         . BD PEN NEEDLE NANO U/F 32G X 4 MM MISC               . buPROPion (WELLBUTRIN XL) 300 MG 24 hr tablet   Oral   Take 300 mg by mouth daily.         . calcium carbonate (OS-CAL) 600 MG TABS   Oral   Take 600 mg by mouth daily with breakfast.          . cephALEXin (KEFLEX) 500 MG capsule   Oral   Take 500 mg by mouth 2 (two) times daily.         . Cyanocobalamin (VITAMIN B 12 PO)   Oral   Take 250 mg by mouth daily.         . DULoxetine (CYMBALTA) 60 MG capsule   Oral   Take 60 mg by mouth daily.         . ferrous fumarate (HEMOCYTE - 106 MG FE) 325 (106 FE) MG TABS   Oral   Take 1 tablet by mouth.         . gabapentin (NEURONTIN) 600 MG tablet   Oral   Take 600 mg by mouth 3 (three) times daily.         . Glucose Blood (BAYER BREEZE 2 TEST) DISK      1 each 3 (three) times daily.         . indomethacin (INDOCIN) 50 MG capsule   Oral   Take 50 mg by mouth 2 (two) times daily as needed (for gout).         . insulin detemir (LEVEMIR) 100 UNIT/ML injection   Subcutaneous   Inject 48 Units into the skin daily.         . lansoprazole (PREVACID) 30 MG capsule   Oral   Take 30 mg by mouth 2 (two) times daily.         Marland Kitchen losartan (COZAAR) 50 MG tablet   Oral   Take 50 mg by mouth  daily.          . Multiple Vitamin (MULTIVITAMIN) tablet   Oral   Take 1 tablet by mouth daily.         Marland Kitchen omeprazole (PRILOSEC) 40 MG capsule   Oral   Take 40 mg by mouth 2 (two) times daily.         Marland Kitchen oxyCODONE-acetaminophen (PERCOCET/ROXICET) 5-325 MG per tablet   Oral   Take 1 tablet by mouth every 6 (six) hours as needed for severe pain.         . promethazine (PHENERGAN) 25 MG tablet   Oral   Take 50 mg by mouth every 6 (six) hours as needed for nausea. For nausea         . simvastatin (ZOCOR) 20 MG tablet   Oral   Take 20 mg by mouth every evening.         . sitaGLIPtin (JANUVIA) 50 MG tablet   Oral   Take 50 mg by mouth daily.         . sitaGLIPtin-metformin (JANUMET) 50-1000 MG per tablet   Oral   Take 1 tablet by mouth 2 (two) times daily with a meal.         . sulfamethoxazole-trimethoprim (BACTRIM DS) 800-160 MG per tablet   Oral   Take 1 tablet by mouth 2 (two) times daily.         Marland Kitchen topiramate (TOPAMAX) 50 MG tablet   Oral   Take 50 mg by mouth daily.          . traMADol (ULTRAM) 50 MG tablet   Oral   Take 50 mg by mouth every 6 (six) hours as needed for moderate pain.          BP 100/68  Pulse 106  Temp(Src) 101 F (38.3 C) (Rectal)  Resp 18  SpO2 94% Physical Exam  Constitutional: She is oriented to person, place, and time. She appears well-developed and well-nourished. No distress.  Cardiovascular: Regular rhythm.  Tachycardia present.   No murmur heard. Pulmonary/Chest: Effort normal. She has no wheezes. She has rales.  Abdominal: Soft. There is no tenderness.  Neurological: She is oriented to person, place, and time.  Skin:  Large pointing abscess to inferior left buttock, with surrounding induration to approximately 8 cm, central ulceration not currently draining, and redness that extends to entire buttock without induration.   Psychiatric: She has a normal mood and affect.    ED Course  Procedures (including critical care  time) Labs Review Labs Reviewed  CULTURE, BLOOD (ROUTINE X 2)  CULTURE, BLOOD (ROUTINE X 2)  WOUND CULTURE  CBC WITH DIFFERENTIAL  COMPREHENSIVE METABOLIC PANEL  LACTIC ACID, PLASMA  URINALYSIS, ROUTINE W REFLEX MICROSCOPIC   Results for orders placed during the hospital encounter of 03/05/13  CBC WITH DIFFERENTIAL      Result Value Range   WBC 25.7 (*) 4.0 - 10.5 K/uL   RBC 3.88  3.87 - 5.11 MIL/uL   Hemoglobin 13.0  12.0 - 15.0 g/dL   HCT 36.4  36.0 - 46.0 %   MCV 93.8  78.0 - 100.0 fL   MCH 33.5  26.0 - 34.0 pg   MCHC 35.7  30.0 - 36.0 g/dL   RDW 12.9  11.5 - 15.5 %   Platelets 384  150 - 400 K/uL   Neutrophils Relative % 88 (*) 43 - 77 %   Lymphocytes Relative 7 (*) 12 - 46 %   Monocytes Relative 5  3 - 12 %   Eosinophils Relative 0  0 - 5 %   Basophils Relative 0  0 - 1 %   Neutro Abs 22.6 (*) 1.7 - 7.7 K/uL   Lymphs Abs 1.8  0.7 - 4.0 K/uL   Monocytes Absolute 1.3 (*) 0.1 - 1.0 K/uL   Eosinophils Absolute 0.0  0.0 - 0.7 K/uL   Basophils Absolute 0.0  0.0 - 0.1 K/uL   WBC Morphology TOXIC GRANULATION    COMPREHENSIVE METABOLIC PANEL      Result Value Range   Sodium 131 (*) 137 - 147 mEq/L   Potassium 5.2  3.7 - 5.3 mEq/L   Chloride 97  96 - 112 mEq/L   CO2 18 (*) 19 - 32 mEq/L   Glucose, Bld 359 (*) 70 - 99 mg/dL   BUN 28 (*) 6 - 23 mg/dL   Creatinine, Ser 1.79 (*) 0.50 - 1.10 mg/dL   Calcium 8.7  8.4 - 10.5 mg/dL   Total Protein 7.2  6.0 - 8.3 g/dL   Albumin 2.4 (*) 3.5 - 5.2 g/dL   AST 37  0 - 37 U/L   ALT 21  0 - 35 U/L   Alkaline Phosphatase 122 (*) 39 - 117 U/L   Total Bilirubin 0.3  0.3 - 1.2 mg/dL   GFR calc non Af Amer 31 (*) >90 mL/min   GFR calc Af Amer 36 (*) >90 mL/min  LACTIC ACID, PLASMA      Result Value Range   Lactic Acid, Venous 1.6  0.5 - 2.2 mmol/L    Imaging Review Dg Chest Portable 1 View  03/05/2013   CLINICAL DATA:  Cellulitis.  EXAM: PORTABLE CHEST - 1 VIEW  COMPARISON:  DG CHEST 1V PORT dated 02/22/2012; DG THORACOLUMBAR SPINE  dated 09/23/2008; DG CHEST 2 VIEW dated 01/16/2008; DG CHEST 1V PORT dated 02/04/2007; DG CHEST 1V PORT dated 02/05/2007  FINDINGS: Cardiac silhouette mildly to moderately enlarged for the AP portable technique, unchanged. Interval development of mild interstitial pulmonary edema since the examination almost 2 weeks ago, superimposed upon mild chronic bronchitic changes. Lungs otherwise clear. No localized airspace consolidation. No pleural effusions. No pneumothorax.  IMPRESSION: Mild CHF, with interval development of mild diffuse interstitial pulmonary edema since the examination on this 2 weeks ago.   Electronically Signed   By: Evangeline Dakin M.D.   On: 03/05/2013 12:18   Ct Pelvis Wo Contrast  03/05/2013   CLINICAL DATA:  Buttock infection  EXAM: CT PELVIS WITHOUT CONTRAST  TECHNIQUE: Multidetector CT imaging of the pelvis was performed following the standard protocol without intravenous contrast.  COMPARISON:  None.  FINDINGS: There are multiple coal less in areas of fluid density within the subcutaneous fat of the left buttock. There is also stranding within the subcutaneous fat just under the dermis throughout the buttock region. The was also stranding extending to the anal region. The ischiorectal fossa is spared. The underlying musculature is unaffected.  No destructive bone lesion.  No acute fracture.  Stranding in the right lower quadrant retroperitoneal fat and to a lesser degree, medial to the descending colon on the left is partially imaged and nonspecific.  Bladder, uterus, and adnexa are unremarkable.  No evidence of free fluid.  Left iliac artery stent is noted.  1.8 x 5.1 cm mass in the left inguinal region is worrisome for enlarged lymph node or possibly focal hemorrhage. There is hyperdense stranding in the subcutaneous fat of the left inguinal region extending to the  left common femoral artery worrisome for a post angiogram hematoma. This measures 5.0 x 2.2 cm.  IMPRESSION: There are multiple  coal less than fluid density areas within the subcutaneous fat of the left buttock. Abscesses cannot be excluded. Contrast-enhanced study is recommended. There is associated stranding in the fat extending to the anus compatible with cellulitis.  There is focal hemorrhage in the left inguinal region most likely due to a post angiogram hematoma. An adjacent soft tissue mass is either an enlarged lymph node or an additional small hemorrhage.   Electronically Signed   By: Maryclare Bean M.D.   On: 03/05/2013 15:01   Dg Chest Port 1 View  03/06/2013   CLINICAL DATA:  Central line location.  EXAM: PORTABLE CHEST - 1 VIEW  COMPARISON:  03/05/2013  FINDINGS: Endotracheal tube is at the level of the lower thoracic trachea, 2.5 cm above the carina. New right IJ catheter, tip near the SVC origin. Streaky lower lung opacities persist. Cardiomegaly. Mild pulmonary venous congestion. No evidence of effusion or pneumothorax.  IMPRESSION: 1. New right IJ catheter, tip near the SVC origin.  No pneumothorax. 2. Pulmonary venous congestion. 3. Unchanged bibasilar atelectasis.   Electronically Signed   By: Jorje Guild M.D.   On: 03/06/2013 01:41   Dg Chest Port 1 View  03/05/2013   CLINICAL DATA:  Status post intubation.  EXAM: PORTABLE CHEST - 1 VIEW  COMPARISON:  Single view of the chest 03/05/2013 at 11:56 a.m.  FINDINGS: Endotracheal tube is in place with the tip just below the clavicular heads, well above the carina. There is new right basilar airspace disease. Left lung is clear. Cardiomegaly without edema is noted. No pneumothorax.  IMPRESSION: ET tube in good position.  Right basilar airspace disease is likely due to atelectasis as the right lung was clear on the film earlier today. Aspiration or pneumonia are within the differential.   Electronically Signed   By: Inge Rise M.D.   On: 03/05/2013 22:40   Dg Chest Portable 1 View  03/05/2013   CLINICAL DATA:  Cellulitis.  EXAM: PORTABLE CHEST - 1 VIEW  COMPARISON:  DG  CHEST 1V PORT dated 02/22/2012; DG THORACOLUMBAR SPINE dated 09/23/2008; DG CHEST 2 VIEW dated 01/16/2008; DG CHEST 1V PORT dated 02/04/2007; DG CHEST 1V PORT dated 02/05/2007  FINDINGS: Cardiac silhouette mildly to moderately enlarged for the AP portable technique, unchanged. Interval development of mild interstitial pulmonary edema since the examination almost 2 weeks ago, superimposed upon mild chronic bronchitic changes. Lungs otherwise clear. No localized airspace consolidation. No pleural effusions. No pneumothorax.  IMPRESSION: Mild CHF, with interval development of mild diffuse interstitial pulmonary edema since the examination on this 2 weeks ago.   Electronically Signed   By: Evangeline Dakin M.D.   On: 03/05/2013 12:18   EKG Interpretation   None       MDM  No diagnosis found. 1. Gluteal abscess 2. Sepsis  Patient requiring admission for signs of sepsis (leukocytosis, hypotension, tachycardia, tachypnea and hypoxia), normal lactic acid, for further treatment of extensive abscess and infection of gluteal area. She is comfortable, maintaining adequate oxygenation on 2L O2 by nasal canula. [facility] accept for admission.    Dewaine Oats, PA-C 03/06/13 [phone]

## 2013-03-05 NOTE — Progress Notes (Signed)
ANTIBIOTIC CONSULT NOTE - INITIAL  Pharmacy Consult for vancomyciin Indication: cellulitis  Allergies  Allergen Reactions  . Celebrex [Celecoxib] Shortness Of Breath  . Codeine Nausea Only    Patient Measurements: Height: 5\' 7"  (170.2 cm) Weight: 276 lb 3.8 oz (125.3 kg) IBW/kg (Calculated) : 61.6   Vital Signs: Temp: 99.4 F (37.4 C) (02/05 1621) Temp src: Oral (02/05 1621) BP: 91/57 mmHg (02/05 1621) Pulse Rate: 103 (02/05 1621) Intake/Output from previous day:   Intake/Output from this shift:    Labs:  Recent Labs  03/05/13 1235  WBC 25.7*  HGB 13.0  PLT 384  CREATININE 1.79*   Estimated Creatinine Clearance: 49.4 ml/min (by C-G formula based on Cr of 1.79). No results found for this basename: VANCOTROUGH, VANCOPEAK, VANCORANDOM, GENTTROUGH, GENTPEAK, GENTRANDOM, TOBRATROUGH, TOBRAPEAK, TOBRARND, AMIKACINPEAK, AMIKACINTROU, AMIKACIN,  in the last 72 hours   Microbiology: No results found for this or any previous visit (from the past 720 hour(s)).  Medical History: Past Medical History  Diagnosis Date  . Diabetes mellitus   . GERD (gastroesophageal reflux disease)   . Anxiety   . CHF (congestive heart failure)   . COPD (chronic obstructive pulmonary disease)   . Sleep apnea     severe OSA by 09/08/06 sleep study ([facility])  . Depression   . Morbid obesity     Medications:  Prescriptions prior to admission  Medication Sig Dispense Refill  . albuterol (PROVENTIL HFA;VENTOLIN HFA) 108 (90 BASE) MCG/ACT inhaler Inhale 2 puffs into the lungs every 6 (six) hours as needed. For wheezing and shortness of breath.      Marland Kitchen albuterol (PROVENTIL) (5 MG/ML) 0.5% nebulizer solution Take 2.5 mg by nebulization every 6 (six) hours as needed. For wheezing and shortness of breath.      Marland Kitchen aspirin EC 325 MG tablet Take 325 mg by mouth daily.      . BD PEN NEEDLE NANO U/F 32G X 4 MM MISC       . buPROPion (WELLBUTRIN XL) 300 MG 24 hr tablet Take 300 mg by mouth daily.      .  calcium carbonate (OS-CAL) 600 MG TABS Take 600 mg by mouth daily with breakfast.       . cephALEXin (KEFLEX) 500 MG capsule Take 500 mg by mouth 2 (two) times daily.      . Cyanocobalamin (VITAMIN B 12 PO) Take 250 mg by mouth daily.      . DULoxetine (CYMBALTA) 60 MG capsule Take 60 mg by mouth daily.      . ferrous fumarate (HEMOCYTE - 106 MG FE) 325 (106 FE) MG TABS Take 1 tablet by mouth.      . gabapentin (NEURONTIN) 600 MG tablet Take 600 mg by mouth 3 (three) times daily.      . Glucose Blood (BAYER BREEZE 2 TEST) DISK 1 each 3 (three) times daily.      . indomethacin (INDOCIN) 50 MG capsule Take 50 mg by mouth 2 (two) times daily as needed (for gout).      . insulin detemir (LEVEMIR) 100 UNIT/ML injection Inject 48 Units into the skin daily.      . lansoprazole (PREVACID) 30 MG capsule Take 30 mg by mouth 2 (two) times daily.      Marland Kitchen losartan (COZAAR) 50 MG tablet Take 50 mg by mouth daily.      . Multiple Vitamin (MULTIVITAMIN) tablet Take 1 tablet by mouth daily.      Marland Kitchen omeprazole (PRILOSEC) 40 MG capsule  Take 40 mg by mouth 2 (two) times daily.      Marland Kitchen oxyCODONE-acetaminophen (PERCOCET/ROXICET) 5-325 MG per tablet Take 1 tablet by mouth every 6 (six) hours as needed for severe pain.      . promethazine (PHENERGAN) 25 MG tablet Take 50 mg by mouth every 6 (six) hours as needed for nausea. For nausea      . simvastatin (ZOCOR) 20 MG tablet Take 20 mg by mouth every evening.      . sitaGLIPtin (JANUVIA) 50 MG tablet Take 50 mg by mouth daily.      . sitaGLIPtin-metformin (JANUMET) 50-1000 MG per tablet Take 1 tablet by mouth 2 (two) times daily with a meal.      . sulfamethoxazole-trimethoprim (BACTRIM DS) 800-160 MG per tablet Take 1 tablet by mouth 2 (two) times daily.      Marland Kitchen topiramate (TOPAMAX) 50 MG tablet Take 50 mg by mouth daily.       . traMADol (ULTRAM) 50 MG tablet Take 50 mg by mouth every 6 (six) hours as needed for moderate pain.       Assessment: 53 yo lady to start  vancomycin for cellulitis, gluteal abscess.  She did not have much improvement with bactrim outpt.  She received 1gm vanc in the ED at 13:48.  Goal of Therapy:  Vancomycin trough level 10-15 mcg/ml  Plan:  Vancomycin 1000 mg Iv X 1 now for total load of 2 gm. Vancomycin 1750 mg IV q24 hours F/u cultures, clinical course and renal function.  Thanks for allowing pharmacy to be a part of this patient's care.  Excell Seltzer, PharmD Clinical Pharmacist, [phone] 03/05/2013,4:44 PM

## 2013-03-05 NOTE — Anesthesia Procedure Notes (Signed)
Procedure Name: Intubation Date/Time: 03/05/2013 8:25 PM Performed by: Hollie Salk Z Pre-anesthesia Checklist: Patient identified, Emergency Drugs available, Suction available and Patient being monitored Patient Re-evaluated:Patient Re-evaluated prior to inductionOxygen Delivery Method: Circle system utilized Preoxygenation: Pre-oxygenation with 100% oxygen Intubation Type: IV induction, Rapid sequence and Cricoid Pressure applied Laryngoscope Size: Mac and 3 Grade View: Grade II Tube type: Oral Tube size: 7.5 mm Number of attempts: 1 Airway Equipment and Method: Stylet Placement Confirmation: ETT inserted through vocal cords under direct vision,  breath sounds checked- equal and bilateral and positive ETCO2 Secured at: 23 cm Tube secured with: Tape Dental Injury: Teeth and Oropharynx as per pre-operative assessment

## 2013-03-05 NOTE — Transfer of Care (Signed)
Immediate Anesthesia Transfer of Care Note  Patient: Stephanie Peck  Procedure(s) Performed: Procedure(s): IRRIGATION AND DEBRIDEMENT ABSCESS (Left)  Patient Location: ICU  Anesthesia Type:General  Level of Consciousness: sedated and Patient remains intubated per anesthesia plan  Airway & Oxygen Therapy: Patient remains intubated per anesthesia plan and Patient placed on Ventilator (see vital sign flow sheet for setting)  Post-op Assessment: Report given to PACU RN and Post -op Vital signs reviewed and stable  Post vital signs: Reviewed and stable  Complications: No apparent anesthesia complications

## 2013-03-05 NOTE — Anesthesia Preprocedure Evaluation (Addendum)
Anesthesia Evaluation  Patient identified by MRN, date of birth, ID band Patient awake    Reviewed: Allergy & Precautions, H&P , NPO status , Patient's Chart, lab work & pertinent test results, reviewed documented beta blocker date and time   Airway Mallampati: II TM Distance: >3 FB Neck ROM: full    Dental   Pulmonary shortness of breath and at rest, sleep apnea and Continuous Positive Airway Pressure Ventilation , COPDformer smoker,  breath sounds clear to auscultation        Cardiovascular Exercise Tolerance: Poor hypertension, + angina + Peripheral Vascular Disease and +CHF Rhythm:regular     Neuro/Psych  Headaches, PSYCHIATRIC DISORDERS    GI/Hepatic Neg liver ROS, GERD-  Medicated and Poorly Controlled,  Endo/Other  diabetes, Insulin DependentMorbid obesity  Renal/GU negative Renal ROS  negative genitourinary   Musculoskeletal   Abdominal   Peds  Hematology negative hematology ROS (+)   Anesthesia Other Findings See surgeon's H&P   Reproductive/Obstetrics negative OB ROS                        Anesthesia Physical Anesthesia Plan  ASA: III and emergent  Anesthesia Plan: General   Post-op Pain Management:    Induction: Intravenous  Airway Management Planned: Oral ETT  Additional Equipment:   Intra-op Plan:   Post-operative Plan: Possible Post-op intubation/ventilation  Informed Consent: I have reviewed the patients History and Physical, chart, labs and discussed the procedure including the risks, benefits and alternatives for the proposed anesthesia with the patient or authorized representative who has indicated his/her understanding and acceptance.   Dental Advisory Given and Dental advisory given  Plan Discussed with: CRNA and Surgeon  Anesthesia Plan Comments:        Anesthesia Quick Evaluation

## 2013-03-05 NOTE — ED Notes (Signed)
Blood cultures drawn prior to IV antibiotics given

## 2013-03-06 ENCOUNTER — Encounter: Payer: Self-pay | Admitting: Surgery

## 2013-03-06 ENCOUNTER — Inpatient Hospital Stay (HOSPITAL_COMMUNITY): Payer: Medicare Other

## 2013-03-06 DIAGNOSIS — I729 Aneurysm of unspecified site: Secondary | ICD-10-CM

## 2013-03-06 DIAGNOSIS — E669 Obesity, unspecified: Secondary | ICD-10-CM

## 2013-03-06 DIAGNOSIS — I723 Aneurysm of iliac artery: Secondary | ICD-10-CM

## 2013-03-06 DIAGNOSIS — R609 Edema, unspecified: Secondary | ICD-10-CM

## 2013-03-06 DIAGNOSIS — I359 Nonrheumatic aortic valve disorder, unspecified: Secondary | ICD-10-CM

## 2013-03-06 LAB — POCT I-STAT 3, ART BLOOD GAS (G3+)
ACID-BASE DEFICIT: 10 mmol/L — AB (ref 0.0–2.0)
ACID-BASE DEFICIT: 9 mmol/L — AB (ref 0.0–2.0)
Acid-base deficit: 7 mmol/L — ABNORMAL HIGH (ref 0.0–2.0)
Acid-base deficit: 10 mmol/L — ABNORMAL HIGH (ref 0.0–2.0)
BICARBONATE: 18.1 meq/L — AB (ref 20.0–24.0)
Bicarbonate: 18.2 mEq/L — ABNORMAL LOW (ref 20.0–24.0)
Bicarbonate: 16.8 mEq/L — ABNORMAL LOW (ref 20.0–24.0)
Bicarbonate: 17.6 mEq/L — ABNORMAL LOW (ref 20.0–24.0)
O2 SAT: 96 %
O2 SAT: 98 %
O2 Saturation: 98 %
O2 Saturation: 98 %
PCO2 ART: 43.2 mmHg (ref 35.0–45.0)
PH ART: 7.235 — AB (ref 7.350–7.450)
PO2 ART: 117 mmHg — AB (ref 80.0–100.0)
PO2 ART: 117 mmHg — AB (ref 80.0–100.0)
PO2 ART: 142 mmHg — AB (ref 80.0–100.0)
Patient temperature: 103.1
Patient temperature: 99.7
Patient temperature: 99.8
TCO2: 18 mmol/L (ref 0–100)
TCO2: 19 mmol/L (ref 0–100)
TCO2: 19 mmol/L (ref 0–100)
TCO2: 19 mmol/L (ref 0–100)
pCO2 arterial: 36.9 mmHg (ref 35.0–45.0)
pCO2 arterial: 45.3 mmHg — ABNORMAL HIGH (ref 35.0–45.0)
pCO2 arterial: 46.8 mmHg — ABNORMAL HIGH (ref 35.0–45.0)
pH, Arterial: 7.302 — ABNORMAL LOW (ref 7.350–7.450)
pH, Arterial: 7.19 — CL (ref 7.350–7.450)
pH, Arterial: 7.185 — CL (ref 7.350–7.450)
pO2, Arterial: 118 mmHg — ABNORMAL HIGH (ref 80.0–100.0)

## 2013-03-06 LAB — INFLUENZA PANEL BY PCR (TYPE A & B)
H1N1 flu by pcr: NOT DETECTED
Influenza A By PCR: NEGATIVE
Influenza B By PCR: NEGATIVE

## 2013-03-06 LAB — BASIC METABOLIC PANEL
BUN: 34 mg/dL — AB (ref 6–23)
BUN: 47 mg/dL — AB (ref 6–23)
CHLORIDE: 100 meq/L (ref 96–112)
CHLORIDE: 99 meq/L (ref 96–112)
CO2: 18 meq/L — AB (ref 19–32)
CO2: 17 mEq/L — ABNORMAL LOW (ref 19–32)
CREATININE: 2.74 mg/dL — AB (ref 0.50–1.10)
Calcium: 8.2 mg/dL — ABNORMAL LOW (ref 8.4–10.5)
Calcium: 7.2 mg/dL — ABNORMAL LOW (ref 8.4–10.5)
Creatinine, Ser: 4.14 mg/dL — ABNORMAL HIGH (ref 0.50–1.10)
GFR calc Af Amer: 21 mL/min — ABNORMAL LOW (ref >90)
GFR calc non Af Amer: 19 mL/min — ABNORMAL LOW (ref >90)
GFR, EST AFRICAN AMERICAN: 13 mL/min — AB (ref >90)
GFR, EST NON AFRICAN AMERICAN: 11 mL/min — AB (ref >90)
Glucose, Bld: 276 mg/dL — ABNORMAL HIGH (ref 70–99)
Glucose, Bld: 358 mg/dL — ABNORMAL HIGH (ref 70–99)
Potassium: 4.1 mEq/L (ref 3.7–5.3)
Potassium: 4.4 mEq/L (ref 3.7–5.3)
Sodium: 135 mEq/L — ABNORMAL LOW (ref 137–147)
Sodium: 134 mEq/L — ABNORMAL LOW (ref 137–147)

## 2013-03-06 LAB — CBC
HEMATOCRIT: 36.4 % (ref 36.0–46.0)
HEMOGLOBIN: 12.3 g/dL (ref 12.0–15.0)
MCH: 32.6 pg (ref 26.0–34.0)
MCHC: 33.8 g/dL (ref 30.0–36.0)
MCV: 96.6 fL (ref 78.0–100.0)
Platelets: 387 10*3/uL (ref 150–400)
RBC: 3.77 MIL/uL — ABNORMAL LOW (ref 3.87–5.11)
RDW: 13.4 % (ref 11.5–15.5)
WBC: 29.9 10*3/uL — ABNORMAL HIGH (ref 4.0–10.5)

## 2013-03-06 LAB — HEMOGLOBIN A1C
Hgb A1c MFr Bld: 11.3 % — ABNORMAL HIGH (ref <5.7)
Mean Plasma Glucose: 278 mg/dL — ABNORMAL HIGH (ref <117)

## 2013-03-06 LAB — PROTIME-INR
INR: 1.28 (ref 0.00–1.49)
Prothrombin Time: 15.7 s — ABNORMAL HIGH (ref 11.6–15.2)

## 2013-03-06 LAB — GLUCOSE, CAPILLARY
GLUCOSE-CAPILLARY: 204 mg/dL — AB (ref 70–99)
GLUCOSE-CAPILLARY: 337 mg/dL — AB (ref 70–99)
Glucose-Capillary: 265 mg/dL — ABNORMAL HIGH (ref 70–99)
Glucose-Capillary: 225 mg/dL — ABNORMAL HIGH (ref 70–99)
Glucose-Capillary: 189 mg/dL — ABNORMAL HIGH (ref 70–99)
Glucose-Capillary: 271 mg/dL — ABNORMAL HIGH (ref 70–99)

## 2013-03-06 LAB — MAGNESIUM
MAGNESIUM: 2 mg/dL (ref 1.5–2.5)
Magnesium: 2 mg/dL (ref 1.5–2.5)

## 2013-03-06 LAB — PHOSPHORUS
PHOSPHORUS: 4.2 mg/dL (ref 2.3–4.6)
Phosphorus: 5.1 mg/dL — ABNORMAL HIGH (ref 2.3–4.6)

## 2013-03-06 LAB — TROPONIN I: Troponin I: <0.3 ng/mL (ref <0.30)

## 2013-03-06 LAB — APTT: aPTT: 39 s — ABNORMAL HIGH (ref 24–37)

## 2013-03-06 MED ORDER — BIOTENE DRY MOUTH MT LIQD
15.0000 mL | Freq: Four times a day (QID) | OROMUCOSAL | Status: DC
  Administered 2013-03-06 – 2013-03-14 (×33): 15 mL via OROMUCOSAL

## 2013-03-06 MED ORDER — VITAL HIGH PROTEIN PO LIQD
1000.0000 mL | ORAL | Status: DC
  Administered 2013-03-06 – 2013-03-07 (×2): 1000 mL
  Filled 2013-03-06 (×3): qty 1000

## 2013-03-06 MED ORDER — SODIUM BICARBONATE 8.4 % IV SOLN
INTRAVENOUS | Status: DC
  Administered 2013-03-06 – 2013-03-09 (×4): via INTRAVENOUS
  Filled 2013-03-06 (×8): qty 150

## 2013-03-06 MED ORDER — VITAL HIGH PROTEIN PO LIQD
1000.0000 mL | ORAL | Status: DC
  Filled 2013-03-06 (×2): qty 1000

## 2013-03-06 MED ORDER — PRO-STAT SUGAR FREE PO LIQD
30.0000 mL | Freq: Every day | ORAL | Status: DC
  Administered 2013-03-06 – 2013-03-08 (×3): 30 mL
  Filled 2013-03-06 (×4): qty 30

## 2013-03-06 MED ORDER — PRO-STAT SUGAR FREE PO LIQD
30.0000 mL | Freq: Two times a day (BID) | ORAL | Status: DC
  Filled 2013-03-06 (×2): qty 30

## 2013-03-06 MED ORDER — NOREPINEPHRINE BITARTRATE 1 MG/ML IJ SOLN
2.0000 ug/min | INTRAVENOUS | Status: DC
  Administered 2013-03-06: 5 ug/min via INTRAVENOUS
  Filled 2013-03-06 (×2): qty 8

## 2013-03-06 MED ORDER — SODIUM CHLORIDE 0.9 % IV BOLUS (SEPSIS)
500.0000 mL | Freq: Once | INTRAVENOUS | Status: AC
  Administered 2013-03-06: 500 mL via INTRAVENOUS

## 2013-03-06 MED ORDER — CHLORHEXIDINE GLUCONATE 0.12 % MT SOLN
15.0000 mL | Freq: Two times a day (BID) | OROMUCOSAL | Status: DC
  Administered 2013-03-06 – 2013-03-14 (×16): 15 mL via OROMUCOSAL
  Filled 2013-03-06 (×14): qty 15

## 2013-03-06 MED ORDER — ACETAMINOPHEN 160 MG/5ML PO SOLN
650.0000 mg | Freq: Four times a day (QID) | ORAL | Status: DC | PRN
  Administered 2013-03-06: 650 mg
  Filled 2013-03-06: qty 20.3

## 2013-03-06 MED ORDER — HEPARIN SODIUM (PORCINE) 5000 UNIT/ML IJ SOLN
5000.0000 [IU] | Freq: Three times a day (TID) | INTRAMUSCULAR | Status: DC
  Administered 2013-03-06 – 2013-03-07 (×5): 5000 [IU] via SUBCUTANEOUS
  Filled 2013-03-06 (×7): qty 1

## 2013-03-06 MED ORDER — SODIUM CHLORIDE 0.9 % IV BOLUS (SEPSIS)
1000.0000 mL | Freq: Once | INTRAVENOUS | Status: AC
  Administered 2013-03-06: 1000 mL via INTRAVENOUS

## 2013-03-06 NOTE — Significant Event (Signed)
ABG    Component Value Date/Time   PHART 7.190* 03/06/2013 1522   PCO2ART 45.3* 03/06/2013 1522   PO2ART 117.0* 03/06/2013 1522   HCO3 16.8* 03/06/2013 1522   TCO2 18 03/06/2013 1522   ACIDBASEDEF 10.0* 03/06/2013 1522   O2SAT 96.0 03/06/2013 1522     Will increase RR from 15 to 20, and add HCO3 to IV fluid.  Will f/u ABG and BMET.  Chesley Mires, MD 03/06/2013, 3:32 PM

## 2013-03-06 NOTE — Progress Notes (Signed)
Utilization review completed. Anel Creighton, RN, BSN. 

## 2013-03-06 NOTE — Procedures (Signed)
PROCEDURE:  Radial artery line placement. (A-line)  INDICATION: monitoring hemodynamics  PROCEDURE OPERATOR: Jerene Pitch, MD and Merrie Roof, MD  CONSENT: obtained by phone via RN  PROCEDURE SUMMARY:  The patient was prepped and draped in the usual sterile manner using chlorhexidine scrub. 1% lidocaine was used to numb the region. The <LEFT> radial artery was palpated and visualized with ultrasound guidance and successfully cannulated on the first pass. Pulsatile, arterial blood was visualized and the artery was unsuccessfully threaded using the Seldinger technique and unable to be obtained. The area was cleaned and decided to attempt right femoral artery approach. Dr. Titus Mould was present during the entire procedure.  ESTIMATED BLOOD LOSS: 2-4cc  [place] Lavon Paganini. Titus Mould, MD, [place] Pgr: [facility]

## 2013-03-06 NOTE — Procedures (Signed)
PROCEDURE:  Femoral artery line placement. (A-line)  INDICATION: monitor hemodynamic status  PROCEDURE OPERATOR: Jerene Pitch, MD and Merrie Roof  CONSENT: Obtained via phone by RN  PROCEDURE SUMMARY:  The patient was prepped and draped in the usual sterile manner using chlorhexidine scrub. 1% lidocaine was used to numb the region. The <RIGHT> femoral artery was accessed using a needle and with ultrasound guidance. Pulsatile, arterial blood was visualized and the artery was then threaded using the Seldinger technique and a catheter was then sutured into place. Good wave-form was obtained. The patient tolerated the procedure well without any immediate complications. The area was cleaned and Tegaderm was applied. Dr. Titus Mould was present during the entire procedure.  ESTIMATED BLOOD LOSS:2-5cc  Failed radials Worsening vent, unable rt to stick for abg US guidance  Lavon Paganini. Titus Mould, MD, [place] Pgr: [facility]

## 2013-03-06 NOTE — Procedures (Addendum)
Central Venous Catheter Insertion Procedure Note ESTEEN DELPRIORE [id number] 1960-06-04  Procedure: Insertion of Central Venous Catheter Indications: Assessment of intravascular volume and Drug and/or fluid administration  Procedure Details Consent: Risks of procedure as well as the alternatives and risks of each were explained to the (patient/caregiver).  Consent for procedure obtained. Time Out: Verified patient identification, verified procedure, site/side was marked, verified correct patient position, special equipment/implants available, medications/allergies/relevent history reviewed, required imaging and test results available.  Performed  Maximum sterile technique was used including antiseptics, cap, gloves, gown, hand hygiene, mask and sheet. Skin prep: Chlorhexidine; local anesthetic administered A antimicrobial bonded/coated triple lumen catheter was placed in the right internal jugular vein using the Seldinger technique.  Ultrasound was used to verify the patency of the vein and for real time needle guidance.  Evaluation Blood flow good Complications: No apparent complications Patient did tolerate procedure well. Chest X-ray ordered to verify placement.  CXR: pending.  Procedure performed by Noe Gens NP under my direct supervision  Levester Waldridge, [place] 03/06/2013, 1:20 AM

## 2013-03-06 NOTE — Progress Notes (Signed)
INITIAL NUTRITION ASSESSMENT  DOCUMENTATION CODES Per approved criteria  -Morbid Obesity   INTERVENTION:  Utilize 62M PEPuP Protocol: initiate TF via OGT with Vital High Protein at 25 ml/h and Prostat 30 ml once daily on day 1; on day 2, increase to goal rate of 65 ml/h (1560 ml per day) to provide 1660 kcals (69% of estimated needs), 152 gm protein, 1304 ml free water daily.  NUTRITION DIAGNOSIS: Inadequate oral intake related to inability to eat as evidenced by NPO status.   Goal: Enteral nutrition to provide 60-70% of estimated calorie needs (22-25 kcals/kg ideal body weight) and 100% of estimated protein needs, based on ASPEN guidelines for permissive underfeeding in critically ill obese individuals.  Monitor:  TF tolerance/adequacy, weight trend, labs, vent status.  Reason for Assessment: MD Consult for TF initiation and management.  53 y.o. female  Admitting Dx: Acute Respiratory Failure / Perirectal abscess & Sepsis   ASSESSMENT: [name] with PMH of DM, GERD, Anxiety, COPD, Severe OSA, COPD, Morbid Obesity who presented to [facility] ER on 2/5 with a 4 day history of worsening left buttock abscess. CT of pelvis demonstrated multiple fluid dense areas within the subq fat of left buttock, stranding in the fat extending to anus compatible with cellulitis, and focal hemorrhage in left inguinal region, adjacent soft tissue mass.  Patient is currently intubated on ventilator support.  MV: 12.2 L/min Temp (24hrs), Avg:99 F (37.2 C), Min:98.3 F (36.8 C), Max:101 F (38.3 C)  Nutrition focused physical exam completed.  No muscle or subcutaneous fat depletion noticed. Discussed patient in ICU rounds today. Received MD Consult for TF initiation and management.  Height: Ht Readings from Last 1 Encounters:  03/05/13 5\' 7"  (1.702 m)    Weight: Wt Readings from Last 1 Encounters:  03/06/13 279 lb 1.6 oz (126.6 kg)    Ideal Body Weight: 61.4 kg  % Ideal Body Weight: 206%  Wt  Readings from Last 10 Encounters:  03/06/13 279 lb 1.6 oz (126.6 kg)  03/06/13 279 lb 1.6 oz (126.6 kg)  12/01/12 283 lb (128.368 kg)  10/22/12 286 lb 4.8 oz (129.865 kg)  08/25/12 280 lb (127.007 kg)  05/19/12 276 lb (125.193 kg)  04/28/12 285 lb (129.275 kg)  04/09/12 284 lb 4.8 oz (128.958 kg)  04/09/12 284 lb 4.8 oz (128.958 kg)  03/31/12 280 lb (127.007 kg)    Usual Body Weight: 276-286 lb  % Usual Body Weight: 100%  BMI:  Body mass index is 43.7 kg/(m^2). class 3, extreme/morbid obesity  Estimated Nutritional Needs: Kcal: 2390 Protein: 153 gm Fluid: 2.2-2.4 L  Skin: 2 necrotic lesions on left buttocks  Diet Order: NPO  EDUCATION NEEDS: -Education not appropriate at this time   Intake/Output Summary (Last 24 hours) at 03/06/13 1119 Last data filed at 03/06/13 1100  Gross per 24 hour  Intake 2935.14 ml  Output    669 ml  Net 2266.14 ml    Last BM: PTA   Labs:   Recent Labs Lab 03/05/13 1235 03/05/13 2247 03/06/13 0410  NA 131* 132* 135*  K 5.2 4.6 4.1  CL 97 98 100  CO2 18* 16* 18*  BUN 28* 32* 34*  CREATININE 1.79* 2.41* 2.74*  CALCIUM 8.7 8.5 8.2*  GLUCOSE 359* 393* 276*    CBG (last 3)   Recent Labs  03/06/13 0011 03/06/13 0359 03/06/13 0815  GLUCAP 337* 265* 225*    Scheduled Meds: . antiseptic oral rinse  15 mL Mouth Rinse QID  .  chlorhexidine  15 mL Mouth Rinse BID  . feeding supplement (PRO-STAT SUGAR FREE 64)  30 mL Per Tube BID  . feeding supplement (VITAL HIGH PROTEIN)  1,000 mL Per Tube Q24H  . heparin subcutaneous  5,000 Units Subcutaneous Q8H  . insulin aspart  0-20 Units Subcutaneous Q4H  . pantoprazole (PROTONIX) IV  40 mg Intravenous Q24H  . piperacillin-tazobactam  3.375 g Intravenous Q8H  . sodium chloride  3 mL Intravenous Q12H  . vancomycin  1,750 mg Intravenous Q24H    Continuous Infusions: . sodium chloride 75 mL/hr at 03/06/13 1045  . fentaNYL infusion INTRAVENOUS 250 mcg/hr (03/06/13 0408)  .  norepinephrine (LEVOPHED) Adult infusion 3 mcg/min (03/06/13 0534)    Past Medical History  Diagnosis Date  . Diabetes mellitus   . GERD (gastroesophageal reflux disease)   . Anxiety   . CHF (congestive heart failure)   . COPD (chronic obstructive pulmonary disease)   . Sleep apnea     severe OSA by 09/08/06 sleep study ([facility])  . Depression   . Morbid obesity     Past Surgical History  Procedure Laterality Date  . Cholecystectomy    . Gastric bypass  2006  . Tonsillectomy    . Femoral artery stent  12/05/2011    right superficial   . Endarterectomy femoral  02/22/2012    Procedure: ENDARTERECTOMY FEMORAL;  Surgeon: Serafina Mitchell, MD;  Location: [facility];  Service: Vascular;  Laterality: Left;  . Patch angioplasty  02/22/2012    Procedure: PATCH ANGIOPLASTY;  Surgeon: Serafina Mitchell, MD;  Location: [facility] OR;  Service: Vascular;  Laterality: Left;  left femoral patch angioplasty  . Application of wound vac  02/22/2012    Procedure: APPLICATION OF WOUND VAC;  Surgeon: Serafina Mitchell, MD;  Location: [place];  Service: Vascular;  Laterality: Left;  application wound vac  . Groin debridement  03/01/2012    Procedure: GROIN DEBRIDEMENT;  Surgeon: Rosetta Posner, MD;  Location: [place];  Service: Vascular;  Laterality: Left;  . Aortogram Left 04/04/2012    Procedure: AORTOGRAM;  Surgeon: Serafina Mitchell, MD;  Location: [place];  Service: Vascular;  Laterality: Left;  . Patch angioplasty Left 04/04/2012    Procedure: PATCH ANGIOPLASTY;  Surgeon: Serafina Mitchell, MD;  Location: [place];  Service: Vascular;  Laterality: Left;  Marland Kitchen Muscle flap closure Left 04/04/2012    Procedure: MUSCLE FLAP CLOSURE;  Surgeon: Theodoro Kos, DO;  Location: [place];  Service: Plastics;  Laterality: Left;    Molli Barrows, RD, LDN, [place] Pager [phone] After Hours Pager [phone]

## 2013-03-06 NOTE — Progress Notes (Signed)
ABG results called to Dr. Halford Chessman.  Ordered to increase respiratory rate to 20.

## 2013-03-06 NOTE — Progress Notes (Signed)
Inpatient Diabetes Program Recommendations  AACE/ADA: New Consensus Statement on Inpatient Glycemic Control (2013)  Target Ranges:  Prepandial:   less than 140 mg/dL      Peak postprandial:   less than 180 mg/dL (1-2 hours)      Critically ill patients:  140 - 180 mg/dL   Reason for Assessment: Results for Stephanie Peck, Stephanie Peck (MRN [id number]) as of 03/06/2013 12:59  Ref. Range 03/05/2013 22:07 03/06/2013 00:11 03/06/2013 03:59 03/06/2013 08:15 03/06/2013 11:50  Glucose-Capillary Latest Range: 70-99 mg/dL 355 (H) 337 (H) 265 (H) 225 (H) 189 (H)    Diabetes history: Type 2 diabetes Outpatient Diabetes medications: Levemir 48 units daily, Janumet 50 mg/1000mg  bid Current orders for Inpatient glycemic control: Novolog resistant q 4 hours   Note history of diabetes.  CBG's very elevated initially.  Please consider ICU Glycemic control protocol and possibly IV insulin.  Thanks, Adah Perl, RN, BC-ADM Inpatient Diabetes Coordinator Pager [phone]

## 2013-03-06 NOTE — Progress Notes (Addendum)
Patient ID: Stephanie Peck, female   DOB: 29-Oct-1960, 53 y.o.   MRN: [id number] 1 Day Post-Op  Subjective: Pt on vent with 74mcg of Levo running right now.  Otherwise stable  Objective: Vital signs in last 24 hours: Temp:  [98 F (36.7 C)-101 F (38.3 C)] 98.3 F (36.8 C) (02/06 0400) Pulse Rate:  [95-115] 100 (02/06 0700) Resp:  [13-25] 14 (02/06 0700) BP: (64-136)/(23-71) 122/62 mmHg (02/06 0700) SpO2:  [88 %-99 %] 95 % (02/06 0700) FiO2 (%):  [70 %-100 %] 70 % (02/06 0400) Weight:  [276 lb 3.8 oz (125.3 kg)-279 lb 1.6 oz (126.6 kg)] 279 lb 1.6 oz (126.6 kg) (02/06 0500) Last BM Date:  (pta)  Intake/Output from previous day: 02/05 0701 - 02/06 0700 In: 2779.5 [I.V.:1479.5; IV Piggyback:1300] Out: 600 [Urine:500; Blood:100] Intake/Output this shift:    PE: Buttock: covered.  Will change dressing this afternoon  Lab Results:   Recent Labs  03/05/13 1235 03/06/13 0410  WBC 25.7* 29.9*  HGB 13.0 12.3  HCT 36.4 36.4  PLT 384 387   BMET  Recent Labs  03/05/13 2247 03/06/13 0410  NA 132* 135*  K 4.6 4.1  CL 98 100  CO2 16* 18*  GLUCOSE 393* 276*  BUN 32* 34*  CREATININE 2.41* 2.74*  CALCIUM 8.5 8.2*   PT/INR No results found for this basename: LABPROT, INR,  in the last 72 hours CMP     Component Value Date/Time   NA 135* 03/06/2013 0410   K 4.1 03/06/2013 0410   CL 100 03/06/2013 0410   CO2 18* 03/06/2013 0410   GLUCOSE 276* 03/06/2013 0410   BUN 34* 03/06/2013 0410   CREATININE 2.74* 03/06/2013 0410   CALCIUM 8.2* 03/06/2013 0410   PROT 7.2 03/05/2013 1235   ALBUMIN 2.4* 03/05/2013 1235   AST 37 03/05/2013 1235   ALT 21 03/05/2013 1235   ALKPHOS 122* 03/05/2013 1235   BILITOT 0.3 03/05/2013 1235   GFRNONAA 19* 03/06/2013 0410   GFRAA 21* 03/06/2013 0410   Lipase     Component Value Date/Time   LIPASE 12 02/04/2007 2219       Studies/Results: Ct Pelvis Wo Contrast  03/05/2013   CLINICAL DATA:  Buttock infection  EXAM: CT PELVIS WITHOUT CONTRAST  TECHNIQUE: Multidetector  CT imaging of the pelvis was performed following the standard protocol without intravenous contrast.  COMPARISON:  None.  FINDINGS: There are multiple coal less in areas of fluid density within the subcutaneous fat of the left buttock. There is also stranding within the subcutaneous fat just under the dermis throughout the buttock region. The was also stranding extending to the anal region. The ischiorectal fossa is spared. The underlying musculature is unaffected.  No destructive bone lesion.  No acute fracture.  Stranding in the right lower quadrant retroperitoneal fat and to a lesser degree, medial to the descending colon on the left is partially imaged and nonspecific.  Bladder, uterus, and adnexa are unremarkable.  No evidence of free fluid.  Left iliac artery stent is noted.  1.8 x 5.1 cm mass in the left inguinal region is worrisome for enlarged lymph node or possibly focal hemorrhage. There is hyperdense stranding in the subcutaneous fat of the left inguinal region extending to the left common femoral artery worrisome for a post angiogram hematoma. This measures 5.0 x 2.2 cm.  IMPRESSION: There are multiple coal less than fluid density areas within the subcutaneous fat of the left buttock. Abscesses cannot be excluded. Contrast-enhanced study  is recommended. There is associated stranding in the fat extending to the anus compatible with cellulitis.  There is focal hemorrhage in the left inguinal region most likely due to a post angiogram hematoma. An adjacent soft tissue mass is either an enlarged lymph node or an additional small hemorrhage.   Electronically Signed   By: Maryclare Bean M.D.   On: 03/05/2013 15:01   Dg Chest Port 1 View  03/06/2013   CLINICAL DATA:  Central line location.  EXAM: PORTABLE CHEST - 1 VIEW  COMPARISON:  03/05/2013  FINDINGS: Endotracheal tube is at the level of the lower thoracic trachea, 2.5 cm above the carina. New right IJ catheter, tip near the SVC origin. Streaky lower lung  opacities persist. Cardiomegaly. Mild pulmonary venous congestion. No evidence of effusion or pneumothorax.  IMPRESSION: 1. New right IJ catheter, tip near the SVC origin.  No pneumothorax. 2. Pulmonary venous congestion. 3. Unchanged bibasilar atelectasis.   Electronically Signed   By: Jorje Guild M.D.   On: 03/06/2013 01:41   Dg Chest Port 1 View  03/05/2013   CLINICAL DATA:  Status post intubation.  EXAM: PORTABLE CHEST - 1 VIEW  COMPARISON:  Single view of the chest 03/05/2013 at 11:56 a.m.  FINDINGS: Endotracheal tube is in place with the tip just below the clavicular heads, well above the carina. There is new right basilar airspace disease. Left lung is clear. Cardiomegaly without edema is noted. No pneumothorax.  IMPRESSION: ET tube in good position.  Right basilar airspace disease is likely due to atelectasis as the right lung was clear on the film earlier today. Aspiration or pneumonia are within the differential.   Electronically Signed   By: Inge Rise M.D.   On: 03/05/2013 22:40   Dg Chest Portable 1 View  03/05/2013   CLINICAL DATA:  Cellulitis.  EXAM: PORTABLE CHEST - 1 VIEW  COMPARISON:  DG CHEST 1V PORT dated 02/22/2012; DG THORACOLUMBAR SPINE dated 09/23/2008; DG CHEST 2 VIEW dated 01/16/2008; DG CHEST 1V PORT dated 02/04/2007; DG CHEST 1V PORT dated 02/05/2007  FINDINGS: Cardiac silhouette mildly to moderately enlarged for the AP portable technique, unchanged. Interval development of mild interstitial pulmonary edema since the examination almost 2 weeks ago, superimposed upon mild chronic bronchitic changes. Lungs otherwise clear. No localized airspace consolidation. No pleural effusions. No pneumothorax.  IMPRESSION: Mild CHF, with interval development of mild diffuse interstitial pulmonary edema since the examination on this 2 weeks ago.   Electronically Signed   By: Evangeline Dakin M.D.   On: 03/05/2013 12:18    Anti-infectives: Anti-infectives   Start     Dose/Rate Route Frequency  Ordered Stop   03/06/13 1400  vancomycin (VANCOCIN) 1,750 mg in sodium chloride 0.9 % 500 mL IVPB     1,750 mg 250 mL/hr over 120 Minutes Intravenous Every 24 hours 03/05/13 1650     03/05/13 2030  piperacillin-tazobactam (ZOSYN) IVPB 3.375 g     3.375 g 12.5 mL/hr over 240 Minutes Intravenous 3 times per day 03/05/13 1637     03/05/13 1700  vancomycin (VANCOCIN) IVPB 1000 mg/200 mL premix     1,000 mg 200 mL/hr over 60 Minutes Intravenous NOW 03/05/13 1650 03/05/13 2027   03/05/13 1200  vancomycin (VANCOCIN) IVPB 1000 mg/200 mL premix     1,000 mg 200 mL/hr over 60 Minutes Intravenous  Once 03/05/13 1148 03/05/13 1453   03/05/13 1200  piperacillin-tazobactam (ZOSYN) IVPB 3.375 g     3.375 g 100 mL/hr over  30 Minutes Intravenous  Once 03/05/13 1148 03/05/13 1348       Assessment/Plan  1. POD 0, s/p I&D of left buttock abscess Patient Active Problem List   Diagnosis Date Noted  . Sepsis 03/05/2013  . CHF, acute on chronic 03/05/2013  . DM type 2 (diabetes mellitus, type 2) 03/05/2013  . Gluteal abscess 03/05/2013  . Aneurysm of unspecified site 08/25/2012  . 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 Encounter for postoperative wound check 04/28/2012  . Pain in limb 03/31/2012  . Peripheral vascular disease, unspecified 02/29/2012  . Pain in the groin 02/29/2012  . Atherosclerosis of native arteries of the extremities with intermittent claudication 11/19/2011  . Hip pain, bilateral 10/10/2011  . Leg pain, bilateral 06/27/2011  . Low back pain 06/27/2011  . Bilateral knee pain 06/27/2011  . DIABETES MELLITUS, TYPE II 03/13/2007  . OBESITY 03/13/2007  . HYPERTENSION 03/13/2007  . UNSTABLE ANGINA 03/13/2007  . CONGESTIVE HEART FAILURE 03/13/2007  . DIASTOLIC DYSFUNCTION 0000000  . GERD 03/13/2007  . SLEEP APNEA, MILD 03/13/2007  . HEADACHE, CHRONIC 03/13/2007   Plan: 1. Will plan on changing dressing this afternoon 2. Cont abx therapy 3. Defer further care to CCM 4.  Further recs after visualization of wound   LOS: 1 day    Vesta Wheeland E 03/06/2013, 8:00 AM Pager: [phone]  ADDENDUM: Wound is relatively clean.  Some black noted, but felt to be secondary to cautery and not necrosis.  Otherwise surrounding tissue is soft and does not appear to have spread right now.  Will evaluate again tomorrow.

## 2013-03-06 NOTE — Progress Notes (Signed)
VASCULAR LAB PRELIMINARY  PRELIMINARY  PRELIMINARY  PRELIMINARY  Bilateral lower extremity venous duplex completed.    Preliminary report:  Bilateral:  No evidence of DVT, superficial thrombosis, or Baker's Cyst. Limited visualization of the right proximal common femoral vein due to bandaging of newly inserted arterial line in the right groin   Renai Lopata, RVS 03/06/2013, 4:11 PM

## 2013-03-06 NOTE — Care Management Note (Unsigned)
    Page 1 of 1   03/06/2013     4:43:33 PM   CARE MANAGEMENT NOTE 03/06/2013  Patient:  Stephanie Peck, Stephanie Peck   Account Number:  [id number]  Date Initiated:  03/06/2013  Documentation initiated by:  Erle Guster  Subjective/Objective Assessment:   PT ADM ON 2/5 WITH SEPSIS DUE TO GLUTEAL ABSCESS.  PTA, PT RESIDES AT [facility].     Action/Plan:   CURRENTLY ON FULL VENT SUPPORT.  WILL FOLLOW PROGRESS.   Anticipated DC Date:  03/11/2013   Anticipated DC Plan:  [place]  CM consult      Choice offered to / List presented to:             Status of service:  In process, will continue to follow Medicare Important Message given?   (If response is "NO", the following Medicare IM given date fields will be blank) Date Medicare IM given:   Date Additional Medicare IM given:    Discharge Disposition:    Per UR Regulation:  Reviewed for med. necessity/level of care/duration of stay  If discussed at [place] of Stay Meetings, dates discussed:    Comments:

## 2013-03-06 NOTE — Progress Notes (Signed)
Name: Stephanie Peck MRN: [id number] DOB: [birth year]     ADMISSION DATE: 03/05/2013  CONSULTATION DATE: 03/05/13  REFERRING MD : [facility]-->PCCM (2/5)   CHIEF COMPLAINT: Acute Respiratory Failure / Perirectal abscess & Sepsis   BRIEF PATIENT DESCRIPTION: [name] , with PMH of DM, GERD, Anxiety, COPD, Severe OSA, COPD, Morbid Obesity who presented to [facility] ER on 2/5 with a 4 day history of worsening left buttock abscess. CT of pelvis demonstrated multiple fluid dense areas within the subq fat of left buttock, stranding in the fat extending to anus compatible with cellulitis, and focal hemorrhage in left inguinal region, adjacent soft tissue mass.   SIGNIFICANT EVENTS / STUDIES:  2/5 - Admit with 4 day hx of worsening abscess on L buttock, fevers, chills. To OR per [facility]. Returned to ICU on vent.  2/5 - CT Pelvis: multiple fluid dense areas within the subq fat of left buttock, stranding in the fat extending to anus compatible with cellulitis, and focal hemorrhage in left inguinal region, adjacent soft tissue mass (?lymph node vs additional small hemorrhage).   LINES / TUBES:  OETT 2/5>>> Rt ij 2/5>>>  CULTURES:  BCx2 2/5>>>  Wound Culture 2/5>>>  Flu 2/5>>>   ANTIBIOTICS:  Vanco 2/5>>>  Zosyn 2/5>>>   SUBJECTIVE: intubated  VITAL SIGNS: Temp:  [98 F (36.7 C)-101 F (38.3 C)] 98.3 F (36.8 C) (02/06 5102) Pulse Rate:  [95-115] 99 (02/06 0800) Resp:  [13-25] 20 (02/06 0800) BP: (64-136)/(23-71) 99/51 mmHg (02/06 0800) SpO2:  [88 %-99 %] 95 % (02/06 0800) FiO2 (%):  [70 %-100 %] 70 % (02/06 0817) Weight:  [276 lb 3.8 oz (125.3 kg)-279 lb 1.6 oz (126.6 kg)] 279 lb 1.6 oz (126.6 kg) (02/06 0500) HEMODYNAMICS: CVP:  [10 mmHg-13 mmHg] 13 mmHg VENTILATOR SETTINGS: Vent Mode:  [-] PCV FiO2 (%):  [70 %-100 %] 70 % Set Rate:  [15 bmp-16 bmp] 15 bmp Vt Set:  [500 mL] 500 mL PEEP:  [5 cmH20] 5 cmH20 Plateau Pressure:  [15 cmH20-17 cmH20] 17 cmH20 INTAKE / OUTPUT: Intake/Output     02/05  0701 - 02/06 0700 02/06 0701 - 02/07 0700   I.V. (mL/kg) 1479.5 (11.7) 155.6 (1.2)   IV Piggyback 1300    Total Intake(mL/kg) 2779.5 (22) 155.6 (1.2)   Urine (mL/kg/hr) 500 24 (0.1)   Blood 100    Total Output 600 24   Net +2179.5 +131.6         PHYSICAL EXAMINATION: General: Morbidly obese female in NAD on vent  Neuro: Awakens, nods appropriately, follows commands  HEENT: Mm pink/moist, OETT, short / thick neck  Cardiovascular: s1s2 rrr, no m/r/g, distant tones  Lungs: resp's even/non-labored on vent, R lung with scattered wheezes  Abdomen: Obese, soft, bsx4 active  Musculoskeletal: No acute deformties  Skin: Warm/dry, L buttock dressing c/d/i, mild bloody drainage, left labia with raised erythematous area concerning for abscess   LABS:  CBC  Recent Labs Lab 03/05/13 1235 03/06/13 0410  WBC 25.7* 29.9*  HGB 13.0 12.3  HCT 36.4 36.4  PLT 384 387   Coag's No results found for this basename: APTT, INR,  in the last 168 hours BMET  Recent Labs Lab 03/05/13 1235 03/05/13 2247 03/06/13 0410  NA 131* 132* 135*  K 5.2 4.6 4.1  CL 97 98 100  CO2 18* 16* 18*  BUN 28* 32* 34*  CREATININE 1.79* 2.41* 2.74*  GLUCOSE 359* 393* 276*   Electrolytes  Recent Labs Lab 03/05/13 1235  03/05/13 2247 03/06/13 0410  CALCIUM 8.7 8.5 8.2*   Sepsis Markers  Recent Labs Lab 03/05/13 1235 03/05/13 2248  LATICACIDVEN 1.6 1.3   ABG  Recent Labs Lab 03/05/13 2352 03/06/13 0236  PHART 7.235* 7.302*  PCO2ART 43.2 36.9  PO2ART 117.0* 118.0*   Liver Enzymes  Recent Labs Lab 03/05/13 1235  AST 37  ALT 21  ALKPHOS 122*  BILITOT 0.3  ALBUMIN 2.4*   Cardiac Enzymes  Recent Labs Lab 03/05/13 1455 03/05/13 2247  TROPONINI <0.30 <0.30  PROBNP 443.4*  --    Glucose  Recent Labs Lab 03/05/13 2207 03/06/13 0011 03/06/13 0359  GLUCAP 355* 337* 265*   Urinalysis    Component Value Date/Time   COLORURINE AMBER* 03/05/2013 1900   APPEARANCEUR CLOUDY* 03/05/2013  1900   LABSPEC 1.028 03/05/2013 1900   PHURINE 5.5 03/05/2013 1900   GLUCOSEU 250* 03/05/2013 1900   HGBUR NEGATIVE 03/05/2013 1900   BILIRUBINUR SMALL* 03/05/2013 1900   KETONESUR NEGATIVE 03/05/2013 1900   PROTEINUR 30* 03/05/2013 1900   UROBILINOGEN 1.0 03/05/2013 1900   NITRITE NEGATIVE 03/05/2013 1900   LEUKOCYTESUR NEGATIVE 03/05/2013 1900    Imaging Ct Pelvis Wo Contrast  03/05/2013   CLINICAL DATA:  Buttock infection  EXAM: CT PELVIS WITHOUT CONTRAST  TECHNIQUE: Multidetector CT imaging of the pelvis was performed following the standard protocol without intravenous contrast.  COMPARISON:  None.  FINDINGS: There are multiple coal less in areas of fluid density within the subcutaneous fat of the left buttock. There is also stranding within the subcutaneous fat just under the dermis throughout the buttock region. The was also stranding extending to the anal region. The ischiorectal fossa is spared. The underlying musculature is unaffected.  No destructive bone lesion.  No acute fracture.  Stranding in the right lower quadrant retroperitoneal fat and to a lesser degree, medial to the descending colon on the left is partially imaged and nonspecific.  Bladder, uterus, and adnexa are unremarkable.  No evidence of free fluid.  Left iliac artery stent is noted.  1.8 x 5.1 cm mass in the left inguinal region is worrisome for enlarged lymph node or possibly focal hemorrhage. There is hyperdense stranding in the subcutaneous fat of the left inguinal region extending to the left common femoral artery worrisome for a post angiogram hematoma. This measures 5.0 x 2.2 cm.  IMPRESSION: There are multiple coal less than fluid density areas within the subcutaneous fat of the left buttock. Abscesses cannot be excluded. Contrast-enhanced study is recommended. There is associated stranding in the fat extending to the anus compatible with cellulitis.  There is focal hemorrhage in the left inguinal region most likely due to a post  angiogram hematoma. An adjacent soft tissue mass is either an enlarged lymph node or an additional small hemorrhage.   Electronically Signed   By: Maryclare Bean M.D.   On: 03/05/2013 15:01   Dg Chest Port 1 View  03/06/2013   CLINICAL DATA:  Central line location.  EXAM: PORTABLE CHEST - 1 VIEW  COMPARISON:  03/05/2013  FINDINGS: Endotracheal tube is at the level of the lower thoracic trachea, 2.5 cm above the carina. New right IJ catheter, tip near the SVC origin. Streaky lower lung opacities persist. Cardiomegaly. Mild pulmonary venous congestion. No evidence of effusion or pneumothorax.  IMPRESSION: 1. New right IJ catheter, tip near the SVC origin.  No pneumothorax. 2. Pulmonary venous congestion. 3. Unchanged bibasilar atelectasis.   Electronically Signed   By: Gilford Silvius.D.  On: 03/06/2013 01:41   Dg Chest Port 1 View  03/05/2013   CLINICAL DATA:  Status post intubation.  EXAM: PORTABLE CHEST - 1 VIEW  COMPARISON:  Single view of the chest 03/05/2013 at 11:56 a.m.  FINDINGS: Endotracheal tube is in place with the tip just below the clavicular heads, well above the carina. There is new right basilar airspace disease. Left lung is clear. Cardiomegaly without edema is noted. No pneumothorax.  IMPRESSION: ET tube in good position.  Right basilar airspace disease is likely due to atelectasis as the right lung was clear on the film earlier today. Aspiration or pneumonia are within the differential.   Electronically Signed   By: Inge Rise M.D.   On: 03/05/2013 22:40   Dg Chest Portable 1 View  03/05/2013   CLINICAL DATA:  Cellulitis.  EXAM: PORTABLE CHEST - 1 VIEW  COMPARISON:  DG CHEST 1V PORT dated 02/22/2012; DG THORACOLUMBAR SPINE dated 09/23/2008; DG CHEST 2 VIEW dated 01/16/2008; DG CHEST 1V PORT dated 02/04/2007; DG CHEST 1V PORT dated 02/05/2007  FINDINGS: Cardiac silhouette mildly to moderately enlarged for the AP portable technique, unchanged. Interval development of mild interstitial pulmonary  edema since the examination almost 2 weeks ago, superimposed upon mild chronic bronchitic changes. Lungs otherwise clear. No localized airspace consolidation. No pleural effusions. No pneumothorax.  IMPRESSION: Mild CHF, with interval development of mild diffuse interstitial pulmonary edema since the examination on this 2 weeks ago.   Electronically Signed   By: Evangeline Dakin M.D.   On: 03/05/2013 12:18     CXR: bibasilr atx, ett wnl, effusion lef small likely  ASSESSMENT / PLAN:  PULMONARY  A:  Acute Respiratory Failure  At Risk ALI  COPD  OSA  P:  -full vent support overnight, 8cc/kg  -f/u abg noted, pao2 noted, reduce O2 likely successful to 50%, increase PC to 20 -if successful to above changes then consider SBT -trend CXR  -am SBT / WUA  -will need CPAP QHS post extubation   CARDIOVASCULAR  A:  CHF Shock, etiology, likely septic  P:  -cycle enzymes neg -continue ASA  -hold home cozaar  -echo ordered -levophed to map goal -cortisol -cvp to goal 12  RENAL  A:  Acute Kidney Injury - in setting of sepsis ARF, atn likely Hyperkalemia improved P:  -allow pos balance, but cvp 12 ( await pulm pressures to help interpret), reduce fluids  -hold nephrotoxic agents   GASTROINTESTINAL  A:  GERD  Hx Gastric Bypass  P:  -protonix while intubated, not on PPI as outpt  -Start TF  HEMATOLOGIC  A:  Chronic Anemia  P:  -hold PO iron  -SCD's / heparin for DVT  coags today  INFECTIOUS  A:  Left Buttock Abscess / Perirectal Cellulitis- s/p I&D 2/5  P:  -abx & cultures as above  - Surgery to change dressing this afternoon -likely dc vanc in am , pending final BC, likley polymicrobial, maintain zosyn  ENDOCRINE  A:  Diabetes  Gout  P:  -SSI with resistant scale  -hold long acting for now  -hold januvia, janumet, indocin   NEUROLOGIC  A:  Pain  Acute Encephalopathy  Depression / Anxiety  P:  -Fentanyl gtt for pain  -PRN versed for sedation needs    -hold cymbalta, wellbutrin, neurontin, topamax   Summary: remains vented, pressors, increase PC and reduce O2, may wean, unlikley to extubate  Howard Pouch DO PGY-2  I have personally obtained a history, examined the patient,  evaluated laboratory and imaging results, formulated the assessment and plan and placed orders. CRITICAL CARE: The patient is critically ill with multiple organ systems failure and requires high complexity decision making for assessment and support, frequent evaluation and titration of therapies, application of advanced monitoring technologies and extensive interpretation of multiple databases. Critical Care Time devoted to patient care services described in this note is 66minutes.   Lavon Paganini. Titus Mould, MD, Blanket Pgr: [facility]  Pulmonary and [place] Pager: [phone]  03/06/2013, 8:23 AM

## 2013-03-06 NOTE — Progress Notes (Signed)
Agree with above, will need to see wound daily, possibly needs more debridement at some point but hopefully done

## 2013-03-06 NOTE — Progress Notes (Signed)
Echocardiogram 2D Echocardiogram has been performed.  Stephanie Peck 03/06/2013, 12:36 PM

## 2013-03-07 ENCOUNTER — Inpatient Hospital Stay (HOSPITAL_COMMUNITY): Payer: Medicare Other

## 2013-03-07 LAB — URINALYSIS, ROUTINE W REFLEX MICROSCOPIC
Glucose, UA: 100 mg/dL — AB
Ketones, ur: 15 mg/dL — AB
NITRITE: NEGATIVE
Protein, ur: 30 mg/dL — AB
Specific Gravity, Urine: 1.029 (ref 1.005–1.030)
Urobilinogen, UA: 1 mg/dL (ref 0.0–1.0)
pH: 5 (ref 5.0–8.0)

## 2013-03-07 LAB — POCT I-STAT 3, ART BLOOD GAS (G3+)
Acid-base deficit: 5 mmol/L — ABNORMAL HIGH (ref 0.0–2.0)
Bicarbonate: 19.8 mEq/L — ABNORMAL LOW (ref 20.0–24.0)
O2 SAT: 95 %
PO2 ART: 82 mmHg (ref 80.0–100.0)
Patient temperature: 100.1
TCO2: 21 mmol/L (ref 0–100)
pCO2 arterial: 38.2 mmHg (ref 35.0–45.0)
pH, Arterial: 7.328 — ABNORMAL LOW (ref 7.350–7.450)

## 2013-03-07 LAB — GLUCOSE, CAPILLARY
GLUCOSE-CAPILLARY: 278 mg/dL — AB (ref 70–99)
GLUCOSE-CAPILLARY: 287 mg/dL — AB (ref 70–99)
GLUCOSE-CAPILLARY: 304 mg/dL — AB (ref 70–99)
GLUCOSE-CAPILLARY: 375 mg/dL — AB (ref 70–99)
Glucose-Capillary: 280 mg/dL — ABNORMAL HIGH (ref 70–99)
Glucose-Capillary: 248 mg/dL — ABNORMAL HIGH (ref 70–99)

## 2013-03-07 LAB — BASIC METABOLIC PANEL
BUN: 54 mg/dL — ABNORMAL HIGH (ref 6–23)
CALCIUM: 7.2 mg/dL — AB (ref 8.4–10.5)
CO2: 20 mEq/L (ref 19–32)
Chloride: 100 mEq/L (ref 96–112)
Creatinine, Ser: 4.96 mg/dL — ABNORMAL HIGH (ref 0.50–1.10)
GFR calc Af Amer: 10 mL/min — ABNORMAL LOW (ref >90)
GFR calc non Af Amer: 9 mL/min — ABNORMAL LOW (ref >90)
GLUCOSE: 249 mg/dL — AB (ref 70–99)
Potassium: 3.9 mEq/L (ref 3.7–5.3)
SODIUM: 136 meq/L — AB (ref 137–147)

## 2013-03-07 LAB — URINE MICROSCOPIC-ADD ON

## 2013-03-07 LAB — CBC
HCT: 31.9 % — ABNORMAL LOW (ref 36.0–46.0)
HEMOGLOBIN: 10.7 g/dL — AB (ref 12.0–15.0)
MCH: 32.6 pg (ref 26.0–34.0)
MCHC: 33.5 g/dL (ref 30.0–36.0)
MCV: 97.3 fL (ref 78.0–100.0)
PLATELETS: 361 10*3/uL (ref 150–400)
RBC: 3.28 MIL/uL — ABNORMAL LOW (ref 3.87–5.11)
RDW: 13.9 % (ref 11.5–15.5)
WBC: 18.9 10*3/uL — ABNORMAL HIGH (ref 4.0–10.5)

## 2013-03-07 LAB — RENAL FUNCTION PANEL
ALBUMIN: 1.9 g/dL — AB (ref 3.5–5.2)
BUN: 58 mg/dL — AB (ref 6–23)
CO2: 20 mEq/L (ref 19–32)
Calcium: 6.9 mg/dL — ABNORMAL LOW (ref 8.4–10.5)
Chloride: 99 mEq/L (ref 96–112)
Creatinine, Ser: 5.51 mg/dL — ABNORMAL HIGH (ref 0.50–1.10)
GFR calc Af Amer: 9 mL/min — ABNORMAL LOW (ref >90)
GFR, EST NON AFRICAN AMERICAN: 8 mL/min — AB (ref >90)
Glucose, Bld: 307 mg/dL — ABNORMAL HIGH (ref 70–99)
POTASSIUM: 3.9 meq/L (ref 3.7–5.3)
Phosphorus: 3.4 mg/dL (ref 2.3–4.6)
Sodium: 136 mEq/L — ABNORMAL LOW (ref 137–147)

## 2013-03-07 LAB — WOUND CULTURE
Gram Stain: NONE SEEN
SPECIAL REQUESTS: NORMAL

## 2013-03-07 LAB — CORTISOL: Cortisol, Plasma: 20.3 ug/dL

## 2013-03-07 LAB — MAGNESIUM: Magnesium: 2.1 mg/dL (ref 1.5–2.5)

## 2013-03-07 LAB — PHOSPHORUS: Phosphorus: 3.6 mg/dL (ref 2.3–4.6)

## 2013-03-07 MED ORDER — VITAL HIGH PROTEIN PO LIQD
1000.0000 mL | ORAL | Status: AC
  Filled 2013-03-07: qty 1000

## 2013-03-07 MED ORDER — SODIUM CHLORIDE 0.9 % IV BOLUS (SEPSIS)
500.0000 mL | Freq: Once | INTRAVENOUS | Status: AC
  Administered 2013-03-07: 500 mL via INTRAVENOUS

## 2013-03-07 MED ORDER — SODIUM CHLORIDE 0.9 % IV SOLN: 1750.0000 mg | INTRAVENOUS | Status: DC

## 2013-03-07 MED ORDER — HEPARIN SODIUM (PORCINE) 5000 UNIT/ML IJ SOLN
5000.0000 [IU] | Freq: Three times a day (TID) | INTRAMUSCULAR | Status: AC
  Administered 2013-03-07 (×2): 5000 [IU] via SUBCUTANEOUS

## 2013-03-07 MED ORDER — PANTOPRAZOLE SODIUM 40 MG PO PACK
40.0000 mg | PACK | Freq: Every day | ORAL | Status: DC
  Administered 2013-03-07 – 2013-03-12 (×6): 40 mg
  Filled 2013-03-07 (×7): qty 20

## 2013-03-07 NOTE — Progress Notes (Signed)
Patient ID: Stephanie Peck, female   DOB: [birth year], 53 y.o.   MRN: [id number] 2 Days Post-Op  Subjective: Pt remains intubated.  BP soft, had to go back on pressors.  Objective: Vital signs in last 24 hours: Temp:  [98.3 F (36.8 C)-103.1 F (39.5 C)] 99 F (37.2 C) (02/07 0749) Pulse Rate:  [82-124] 86 (02/07 0740) Resp:  [10-30] 30 (02/07 0740) BP: (76-126)/(34-87) 83/58 mmHg (02/07 0740) SpO2:  [90 %-99 %] 96 % (02/07 0740) Arterial Line BP: (82-127)/(37-53) 100/48 mmHg (02/07 0700) FiO2 (%):  [50 %-70 %] 50 % (02/07 0740) Weight:  [283 lb 4.7 oz (128.5 kg)] 283 lb 4.7 oz (128.5 kg) (02/07 0500) Last BM Date:  (pts)  Intake/Output from previous day: 02/06 0701 - 02/07 0700 In: 3239.6 [I.V.:2251.9; NG/GT:337.7; IV Piggyback:650] Out: 57 [Urine:239] Intake/Output this shift:    PE: Skin: left buttock wound is overall fairly clean, there is some necrotic tissues at the most anterior portion of the wound that has developed since yesterday, however, there is minimal induration or erythema on the exterior skin.  Her labia is soft as well as most of her perineum.  Lab Results:   Recent Labs  03/05/13 1235 03/06/13 0410  WBC 25.7* 29.9*  HGB 13.0 12.3  HCT 36.4 36.4  PLT 384 387   BMET  Recent Labs  03/06/13 0410 03/06/13 2150  NA 135* 134*  K 4.1 4.4  CL 100 99  CO2 18* 17*  GLUCOSE 276* 358*  BUN 34* 47*  CREATININE 2.74* 4.14*  CALCIUM 8.2* 7.2*   PT/INR  Recent Labs  03/06/13 1052  LABPROT 15.7*  INR 1.28   CMP     Component Value Date/Time   NA 134* 03/06/2013 2150   K 4.4 03/06/2013 2150   CL 99 03/06/2013 2150   CO2 17* 03/06/2013 2150   GLUCOSE 358* 03/06/2013 2150   BUN 47* 03/06/2013 2150   CREATININE 4.14* 03/06/2013 2150   CALCIUM 7.2* 03/06/2013 2150   PROT 7.2 03/05/2013 1235   ALBUMIN 2.4* 03/05/2013 1235   AST 37 03/05/2013 1235   ALT 21 03/05/2013 1235   ALKPHOS 122* 03/05/2013 1235   BILITOT 0.3 03/05/2013 1235   GFRNONAA 11* 03/06/2013 2150   GFRAA  13* 03/06/2013 2150   Lipase     Component Value Date/Time   LIPASE 12 02/04/2007 2219       Studies/Results: Ct Pelvis Wo Contrast  03/05/2013   CLINICAL DATA:  Buttock infection  EXAM: CT PELVIS WITHOUT CONTRAST  TECHNIQUE: Multidetector CT imaging of the pelvis was performed following the standard protocol without intravenous contrast.  COMPARISON:  None.  FINDINGS: There are multiple coal less in areas of fluid density within the subcutaneous fat of the left buttock. There is also stranding within the subcutaneous fat just under the dermis throughout the buttock region. The was also stranding extending to the anal region. The ischiorectal fossa is spared. The underlying musculature is unaffected.  No destructive bone lesion.  No acute fracture.  Stranding in the right lower quadrant retroperitoneal fat and to a lesser degree, medial to the descending colon on the left is partially imaged and nonspecific.  Bladder, uterus, and adnexa are unremarkable.  No evidence of free fluid.  Left iliac artery stent is noted.  1.8 x 5.1 cm mass in the left inguinal region is worrisome for enlarged lymph node or possibly focal hemorrhage. There is hyperdense stranding in the subcutaneous fat of the left inguinal region extending  to the left common femoral artery worrisome for a post angiogram hematoma. This measures 5.0 x 2.2 cm.  IMPRESSION: There are multiple coal less than fluid density areas within the subcutaneous fat of the left buttock. Abscesses cannot be excluded. Contrast-enhanced study is recommended. There is associated stranding in the fat extending to the anus compatible with cellulitis.  There is focal hemorrhage in the left inguinal region most likely due to a post angiogram hematoma. An adjacent soft tissue mass is either an enlarged lymph node or an additional small hemorrhage.   Electronically Signed   By: Maryclare Bean M.D.   On: 03/05/2013 15:01   Dg Chest Port 1 View  03/06/2013   CLINICAL DATA:   Central line location.  EXAM: PORTABLE CHEST - 1 VIEW  COMPARISON:  03/05/2013  FINDINGS: Endotracheal tube is at the level of the lower thoracic trachea, 2.5 cm above the carina. New right IJ catheter, tip near the SVC origin. Streaky lower lung opacities persist. Cardiomegaly. Mild pulmonary venous congestion. No evidence of effusion or pneumothorax.  IMPRESSION: 1. New right IJ catheter, tip near the SVC origin.  No pneumothorax. 2. Pulmonary venous congestion. 3. Unchanged bibasilar atelectasis.   Electronically Signed   By: Jorje Guild M.D.   On: 03/06/2013 01:41   Dg Chest Port 1 View  03/05/2013   CLINICAL DATA:  Status post intubation.  EXAM: PORTABLE CHEST - 1 VIEW  COMPARISON:  Single view of the chest 03/05/2013 at 11:56 a.m.  FINDINGS: Endotracheal tube is in place with the tip just below the clavicular heads, well above the carina. There is new right basilar airspace disease. Left lung is clear. Cardiomegaly without edema is noted. No pneumothorax.  IMPRESSION: ET tube in good position.  Right basilar airspace disease is likely due to atelectasis as the right lung was clear on the film earlier today. Aspiration or pneumonia are within the differential.   Electronically Signed   By: Inge Rise M.D.   On: 03/05/2013 22:40   Dg Chest Portable 1 View  03/05/2013   CLINICAL DATA:  Cellulitis.  EXAM: PORTABLE CHEST - 1 VIEW  COMPARISON:  DG CHEST 1V PORT dated 02/22/2012; DG THORACOLUMBAR SPINE dated 09/23/2008; DG CHEST 2 VIEW dated 01/16/2008; DG CHEST 1V PORT dated 02/04/2007; DG CHEST 1V PORT dated 02/05/2007  FINDINGS: Cardiac silhouette mildly to moderately enlarged for the AP portable technique, unchanged. Interval development of mild interstitial pulmonary edema since the examination almost 2 weeks ago, superimposed upon mild chronic bronchitic changes. Lungs otherwise clear. No localized airspace consolidation. No pleural effusions. No pneumothorax.  IMPRESSION: Mild CHF, with interval  development of mild diffuse interstitial pulmonary edema since the examination on this 2 weeks ago.   Electronically Signed   By: Evangeline Dakin M.D.   On: 03/05/2013 12:18   Dg Abd Portable 1v  03/06/2013   CLINICAL DATA:  Placement of orogastric tube  EXAM: PORTABLE ABDOMEN - 1 VIEW  COMPARISON:  March 05, 2013 CT pelvis  FINDINGS: Orogastric tube tip and side port are in the stomach. There is somewhat of a paucity of gas. There is atelectatic change in the lung bases. No free air is seen on this supine examination.  IMPRESSION: Orogastric tube tip and side port in stomach. Relative paucity of gas. While this finding may be seen normally, it also may be seen with enteritis or early ileus. Obstruction felt to be less likely. No free air seen on this supine examination.   Electronically Signed  By: Lowella Grip M.D.   On: 03/06/2013 11:34    Anti-infectives: Anti-infectives   Start     Dose/Rate Route Frequency Ordered Stop   03/06/13 1400  vancomycin (VANCOCIN) 1,750 mg in sodium chloride 0.9 % 500 mL IVPB     1,750 mg 250 mL/hr over 120 Minutes Intravenous Every 24 hours 03/05/13 1650     03/05/13 2030  piperacillin-tazobactam (ZOSYN) IVPB 3.375 g     3.375 g 12.5 mL/hr over 240 Minutes Intravenous 3 times per day 03/05/13 1637     03/05/13 1700  vancomycin (VANCOCIN) IVPB 1000 mg/200 mL premix     1,000 mg 200 mL/hr over 60 Minutes Intravenous NOW 03/05/13 1650 03/05/13 2027   03/05/13 1200  vancomycin (VANCOCIN) IVPB 1000 mg/200 mL premix     1,000 mg 200 mL/hr over 60 Minutes Intravenous  Once 03/05/13 1148 03/05/13 1453   03/05/13 1200  piperacillin-tazobactam (ZOSYN) IVPB 3.375 g     3.375 g 100 mL/hr over 30 Minutes Intravenous  Once 03/05/13 1148 03/05/13 1348       Assessment/Plan  1. Left gluteal abscess, POD 2, s/p I&D Patient Active Problem List   Diagnosis Date Noted  . Sepsis 03/05/2013  . CHF, acute on chronic 03/05/2013  . DM type 2 (diabetes mellitus, type  2) 03/05/2013  . Gluteal abscess 03/05/2013  . Aneurysm of unspecified site 08/25/2012  . 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 Encounter for postoperative wound check 04/28/2012  . Pain in limb 03/31/2012  . Peripheral vascular disease, unspecified 02/29/2012  . Pain in the groin 02/29/2012  . Atherosclerosis of native arteries of the extremities with intermittent claudication 11/19/2011  . Hip pain, bilateral 10/10/2011  . Leg pain, bilateral 06/27/2011  . Low back pain 06/27/2011  . Bilateral knee pain 06/27/2011  . DIABETES MELLITUS, TYPE II 03/13/2007  . OBESITY 03/13/2007  . HYPERTENSION 03/13/2007  . UNSTABLE ANGINA 03/13/2007  . CONGESTIVE HEART FAILURE 03/13/2007  . DIASTOLIC DYSFUNCTION 19/37/9024  . GERD 03/13/2007  . SLEEP APNEA, MILD 03/13/2007  . HEADACHE, CHRONIC 03/13/2007   Plan: 1. Patient getting TF today.  Do not feel area is significant enough today to require return to OR, but suspect it may declare itself by tomorrow.  Will stop TFs after MN in case she needs to return to OR tomorrow.  Will re-examine wound in am and make that decision.  I have spoken to her husband by phone to update him on our issues. 2. Check CBC and BMET   LOS: 2 days    Kayslee Furey E 03/07/2013, 8:07 AM Pager: [phone]

## 2013-03-07 NOTE — Progress Notes (Signed)
[place] [facility]    Name: Stephanie Peck MRN: [id number] DOB: 1960-12-23     ADMISSION DATE: 03/05/2013  CONSULTATION DATE: 03/05/13  REFERRING MD : [facility]-->PCCM (2/5)   CHIEF COMPLAINT: Acute Respiratory Failure / Perirectal abscess & Sepsis   BRIEF PATIENT DESCRIPTION: [name] , with PMH of DM, GERD, Anxiety, COPD, Severe OSA, COPD, Morbid Obesity who presented to [facility] ER on 2/5 with a 4 day history of worsening left buttock abscess. CT of pelvis demonstrated multiple fluid dense areas within the subq fat of left buttock, stranding in the fat extending to anus compatible with cellulitis, and focal hemorrhage in left inguinal region, adjacent soft tissue mass.   SIGNIFICANT EVENTS / STUDIES:  2/5 - Admit with 4 day hx of worsening abscess on L buttock, fevers, chills. To OR per [facility]. Returned to ICU on vent.  2/5 - CT Pelvis: multiple fluid dense areas within the subq fat of left buttock, stranding in the fat extending to anus compatible with cellulitis, and focal hemorrhage in left inguinal region, adjacent soft tissue mass (?lymph node vs additional small hemorrhage). 2/6 2D echo>>> EF55-60%,  Mild AS, trivial pericardial effusion,   LINES / TUBES:  OETT 2/5>>> Rt IJ CVL 2/5>>>  CULTURES:  BCx2 2/5>>>  Wound Culture 2/5>>> EColi  Abscess culture (OR) 2/5>>>GNR>>> Flu 2/5>>> neg  ANTIBIOTICS:  Vanco 2/5>>> 2/7 Zosyn 2/5>>>   SUBJECTIVE:  Decreased UOP, off pressors currently but on and off overnight.  Acidotic.   VITAL SIGNS: Temp:  [98.9 F (37.2 C)-103.1 F (39.5 C)] 99 F (37.2 C) (02/07 0749) Pulse Rate:  [82-124] 89 (02/07 1000) Resp:  [10-30] 30 (02/07 1000) BP: (76-126)/(34-87) 94/51 mmHg (02/07 1000) SpO2:  [90 %-99 %] 95 % (02/07 1000) Arterial Line BP: (82-150)/(37-53) 106/43 mmHg (02/07 1000) FiO2 (%):  [50 %-70 %] 50 % (02/07 0740) Weight:  [283 lb 4.7 oz (128.5 kg)] 283 lb 4.7 oz (128.5 kg) (02/07 0500) HEMODYNAMICS: CVP:  [8 mmHg-12 mmHg] 8 mmHg VENTILATOR  SETTINGS: Vent Mode:  [-] PCV FiO2 (%):  [50 %-70 %] 50 % Set Rate:  [15 bmp-30 bmp] 30 bmp PEEP:  [5 cmH20] 5 cmH20 Plateau Pressure:  [25 cmH20] 25 cmH20 INTAKE / OUTPUT: Intake/Output     02/06 0701 - 02/07 0700 02/07 0701 - 02/08 0700   I.V. (mL/kg) 2251.9 (17.5) 303.7 (2.4)   NG/GT 337.7 195   IV Piggyback 650    Total Intake(mL/kg) 3239.6 (25.2) 498.7 (3.9)   Urine (mL/kg/hr) 239 (0.1) 17 (0)   Blood     Total Output 239 17   Net +3000.6 +481.7         PHYSICAL EXAMINATION: General: Morbidly obese female in NAD on vent  Neuro: sedated on vent, RASS -1, Awakens, nods appropriately, follows commands  HEENT: Mm pink/moist, OETT, short / thick neck  Cardiovascular: s1s2 rrr, no m/r/g, distant tones  Lungs: resp's even/non-labored on vent, very diminished bases   Abdomen: Obese, soft, bsx4 active  Musculoskeletal: No acute deformties  Skin: Warm/dry, L buttock dressing c/d/i, erythematous perineum   LABS:  CBC  Recent Labs Lab 03/05/13 1235 03/06/13 0410 03/07/13 0825  WBC 25.7* 29.9* 18.9*  HGB 13.0 12.3 10.7*  HCT 36.4 36.4 31.9*  PLT 384 387 361   Coag's  Recent Labs Lab 03/06/13 1052  APTT 39*  INR 1.28   BMET  Recent Labs Lab 03/06/13 0410 03/06/13 2150 03/07/13 0825  NA 135* 134* 136*  K 4.1 4.4 3.9  CL 100 99 100  CO2 18* 17* 20  BUN 34* 47* 54*  CREATININE 2.74* 4.14* 4.96*  GLUCOSE 276* 358* 249*   Electrolytes  Recent Labs Lab 03/06/13 0410 03/06/13 1118 03/06/13 2150 03/07/13 0825  CALCIUM 8.2*  --  7.2* 7.2*  MG  --  2.0 2.0 2.1  PHOS  --  4.2 5.1* 3.6   Sepsis Markers  Recent Labs Lab 03/05/13 1235 03/05/13 2248  LATICACIDVEN 1.6 1.3   ABG  Recent Labs Lab 03/06/13 0236 03/06/13 1522 03/06/13 2120  PHART 7.302* 7.190* 7.185*  PCO2ART 36.9 45.3* 46.8*  PO2ART 118.0* 117.0* 142.0*   Liver Enzymes  Recent Labs Lab 03/05/13 1235  AST 37  ALT 21  ALKPHOS 122*  BILITOT 0.3  ALBUMIN 2.4*   Cardiac  Enzymes  Recent Labs Lab 03/05/13 1455 03/05/13 2247 03/06/13 0433  TROPONINI <0.30 <0.30 <0.30  PROBNP 443.4*  --   --    Glucose  Recent Labs Lab 03/06/13 1150 03/06/13 1550 03/06/13 1900 03/06/13 2358 03/07/13 0352 03/07/13 0734  GLUCAP 189* 204* 271* 375* 280* 248*   Urinalysis    Component Value Date/Time   COLORURINE AMBER* 03/05/2013 1900   APPEARANCEUR CLOUDY* 03/05/2013 1900   LABSPEC 1.028 03/05/2013 1900   PHURINE 5.5 03/05/2013 1900   GLUCOSEU 250* 03/05/2013 1900   HGBUR NEGATIVE 03/05/2013 1900   BILIRUBINUR SMALL* 03/05/2013 1900   KETONESUR NEGATIVE 03/05/2013 1900   PROTEINUR 30* 03/05/2013 1900   UROBILINOGEN 1.0 03/05/2013 1900   NITRITE NEGATIVE 03/05/2013 1900   LEUKOCYTESUR NEGATIVE 03/05/2013 1900    Imaging Ct Pelvis Wo Contrast  03/05/2013   CLINICAL DATA:  Buttock infection  EXAM: CT PELVIS WITHOUT CONTRAST  TECHNIQUE: Multidetector CT imaging of the pelvis was performed following the standard protocol without intravenous contrast.  COMPARISON:  None.  FINDINGS: There are multiple coal less in areas of fluid density within the subcutaneous fat of the left buttock. There is also stranding within the subcutaneous fat just under the dermis throughout the buttock region. The was also stranding extending to the anal region. The ischiorectal fossa is spared. The underlying musculature is unaffected.  No destructive bone lesion.  No acute fracture.  Stranding in the right lower quadrant retroperitoneal fat and to a lesser degree, medial to the descending colon on the left is partially imaged and nonspecific.  Bladder, uterus, and adnexa are unremarkable.  No evidence of free fluid.  Left iliac artery stent is noted.  1.8 x 5.1 cm mass in the left inguinal region is worrisome for enlarged lymph node or possibly focal hemorrhage. There is hyperdense stranding in the subcutaneous fat of the left inguinal region extending to the left common femoral artery worrisome for a post angiogram  hematoma. This measures 5.0 x 2.2 cm.  IMPRESSION: There are multiple coal less than fluid density areas within the subcutaneous fat of the left buttock. Abscesses cannot be excluded. Contrast-enhanced study is recommended. There is associated stranding in the fat extending to the anus compatible with cellulitis.  There is focal hemorrhage in the left inguinal region most likely due to a post angiogram hematoma. An adjacent soft tissue mass is either an enlarged lymph node or an additional small hemorrhage.   Electronically Signed   By: Maryclare Bean M.D.   On: 03/05/2013 15:01   Dg Chest Port 1 View  03/06/2013   CLINICAL DATA:  Central line location.  EXAM: PORTABLE CHEST - 1 VIEW  COMPARISON:  03/05/2013  FINDINGS: Endotracheal  tube is at the level of the lower thoracic trachea, 2.5 cm above the carina. New right IJ catheter, tip near the SVC origin. Streaky lower lung opacities persist. Cardiomegaly. Mild pulmonary venous congestion. No evidence of effusion or pneumothorax.  IMPRESSION: 1. New right IJ catheter, tip near the SVC origin.  No pneumothorax. 2. Pulmonary venous congestion. 3. Unchanged bibasilar atelectasis.   Electronically Signed   By: Jorje Guild M.D.   On: 03/06/2013 01:41   Dg Chest Port 1 View  03/05/2013   CLINICAL DATA:  Status post intubation.  EXAM: PORTABLE CHEST - 1 VIEW  COMPARISON:  Single view of the chest 03/05/2013 at 11:56 a.m.  FINDINGS: Endotracheal tube is in place with the tip just below the clavicular heads, well above the carina. There is new right basilar airspace disease. Left lung is clear. Cardiomegaly without edema is noted. No pneumothorax.  IMPRESSION: ET tube in good position.  Right basilar airspace disease is likely due to atelectasis as the right lung was clear on the film earlier today. Aspiration or pneumonia are within the differential.   Electronically Signed   By: Inge Rise M.D.   On: 03/05/2013 22:40   Dg Chest Portable 1 View  03/05/2013    CLINICAL DATA:  Cellulitis.  EXAM: PORTABLE CHEST - 1 VIEW  COMPARISON:  DG CHEST 1V PORT dated 02/22/2012; DG THORACOLUMBAR SPINE dated 09/23/2008; DG CHEST 2 VIEW dated 01/16/2008; DG CHEST 1V PORT dated 02/04/2007; DG CHEST 1V PORT dated 02/05/2007  FINDINGS: Cardiac silhouette mildly to moderately enlarged for the AP portable technique, unchanged. Interval development of mild interstitial pulmonary edema since the examination almost 2 weeks ago, superimposed upon mild chronic bronchitic changes. Lungs otherwise clear. No localized airspace consolidation. No pleural effusions. No pneumothorax.  IMPRESSION: Mild CHF, with interval development of mild diffuse interstitial pulmonary edema since the examination on this 2 weeks ago.   Electronically Signed   By: Evangeline Dakin M.D.   On: 03/05/2013 12:18   Dg Abd Portable 1v  03/06/2013   CLINICAL DATA:  Placement of orogastric tube  EXAM: PORTABLE ABDOMEN - 1 VIEW  COMPARISON:  March 05, 2013 CT pelvis  FINDINGS: Orogastric tube tip and side port are in the stomach. There is somewhat of a paucity of gas. There is atelectatic change in the lung bases. No free air is seen on this supine examination.  IMPRESSION: Orogastric tube tip and side port in stomach. Relative paucity of gas. While this finding may be seen normally, it also may be seen with enteritis or early ileus. Obstruction felt to be less likely. No free air seen on this supine examination.   Electronically Signed   By: Lowella Grip M.D.   On: 03/06/2013 11:34     ASSESSMENT / PLAN:  PULMONARY  A:  Acute Respiratory Failure  COPD  OSA  P:  -continue vent with daytime PS wean as tol  -??return to OR 2/8 - will hold extubation  -f/u ABG - mixed acidosis  -f/u CXR  -am SBT / WUA  -will need CPAP QHS post extubation   CARDIOVASCULAR  A:  CHF Hypotension  Shock, etiology, likely septic  P:  -continue gentle volume with CVP 7, hypotensive, decreased UOP -continue ASA  -hold home  cozaar  -levophed to map goal -hold stress steroids for now - cortisol ok   -cvp to goal 12  RENAL  A:  Acute Renal failure - ATN likely - in setting of sepsis (CKD stage  3) Hyperkalemia improved Metabolic acidosis  Worsening UOP  P:  -allow pos balance  -f/u ABG  -hold nephrotoxic agents  -will ask renal to see    GASTROINTESTINAL  A:  GERD  Hx Gastric Bypass  P:  -protonix while intubated, not on PPI as outpt  -Continue TF  HEMATOLOGIC  A:  Chronic Anemia  coags ok  P:  -hold PO iron  -SCD's / heparin for DVT    INFECTIOUS  A:  Left Buttock Abscess / Perirectal Cellulitis- s/p I&D 2/5  P:  -abx & cultures as above  -??return to OR 2/8 - d/c vanc  -continue zosyn - likely polymicrobial - await final abscess culture   ENDOCRINE  A:  Diabetes  Gout  P:  -SSI with resistant scale  -hold long acting for now  -hold januvia, janumet, indocin   NEUROLOGIC  A:  Pain  Acute Encephalopathy  Depression / Anxiety  P:  -Fentanyl gtt for pain  -PRN versed for sedation needs  -hold cymbalta, wellbutrin, neurontin, topamax   Family updated at length at bedside 2/7   I have personally obtained a history, examined the patient, evaluated laboratory and imaging results, formulated the assessment and plan and placed orders.  CRITICAL CARE: The patient is critically ill with multiple organ systems failure and requires high complexity decision making for assessment and support, frequent evaluation and titration of therapies, application of advanced monitoring technologies and extensive interpretation of multiple databases. Critical Care Time devoted to patient care services described in this note is 40 minutes.   [name], NP 03/07/2013  11:11 AM Pager: [phone] or [phone]  *Care during the described time interval was provided by me and/or other providers on the critical care team. I have reviewed this patient's available data, including  medical history, events of note, physical examination and test results as part of my evaluation.  Baltazar Apo, MD, PhD 03/07/2013, 1:56 PM [facility] [phone] or if no answer [phone]

## 2013-03-07 NOTE — Progress Notes (Signed)
Irrigated patient's foley due to continued decrease in urine output. No obstruction found.  Olevia Perches, RN

## 2013-03-07 NOTE — Consult Note (Signed)
Stephanie Peck 03/07/2013 Pearson Grippe, B Requesting Physician:  Chase Caller  Reason for Consult:  AKI HPI:  53F w/ CKD3, OSA, uncontrolled DM2, PVD morbid obesity admitted 03/05/13 with L buttock abscess with necrotizing soft tissue infection and hypoxic RF.  Went to OR for I&D and not extubated since. Has developed septic shock, treated with NE (just weaned off) and vanc/zosyn.  It remains unclear if she'll need subsequent debridement.  SCr trend as below and she has become anuric over past 12h.    No IV contrast.  CT A/P 2/5 with unremarkable kidneys.  TTE yesterday with LVEF >55%. No NSAIDs.  With worsening metabolic acidosis and resp acidosis placed on NaHCO3 gtt 175mEq @ 35mL/hr.    Home meds included losartan, indomethacin, and had been Rx TMP/SMX preceding admission  Pt w/ foley catheter currently.   Creatinine, Ser (mg/dL)  Date Value  03/07/2013 4.96*  03/06/2013 4.14*  03/06/2013 2.74*  03/05/2013 2.41*  03/05/2013 1.79*  12/09/2012 1.20*  04/09/2012 1.64*  04/04/2012 1.46*  04/03/2012 1.58*  04/02/2012 1.66*  ] I/Os: I/O last 3 completed shifts: In: 6019.1 [I.V.:3731.5; NG/GT:337.7; IV Piggyback:1950] Out: [id number] [Urine:739; Blood:100]   ROS Unable to complete, pt intubated and sedated  PMH  Past Medical History  Diagnosis Date  . Diabetes mellitus   . GERD (gastroesophageal reflux disease)   . Anxiety   . CHF (congestive heart failure)   . COPD (chronic obstructive pulmonary disease)   . Sleep apnea     severe OSA by 09/08/06 sleep study ([facility])  . Depression   . Morbid obesity    PSH  Past Surgical History  Procedure Laterality Date  . Cholecystectomy    . Gastric bypass  2006  . Tonsillectomy    . Femoral artery stent  12/05/2011    right superficial   . Endarterectomy femoral  02/22/2012    Procedure: ENDARTERECTOMY FEMORAL;  Surgeon: Serafina Mitchell, MD;  Location: [facility];  Service: Vascular;  Laterality: Left;  . Patch angioplasty  02/22/2012    Procedure: PATCH  ANGIOPLASTY;  Surgeon: Serafina Mitchell, MD;  Location: [facility] OR;  Service: Vascular;  Laterality: Left;  left femoral patch angioplasty  . Application of wound vac  02/22/2012    Procedure: APPLICATION OF WOUND VAC;  Surgeon: Serafina Mitchell, MD;  Location: [facility];  Service: Vascular;  Laterality: Left;  application wound vac  . Groin debridement  03/01/2012    Procedure: GROIN DEBRIDEMENT;  Surgeon: Rosetta Posner, MD;  Location: [place];  Service: Vascular;  Laterality: Left;  . Aortogram Left 04/04/2012    Procedure: AORTOGRAM;  Surgeon: Serafina Mitchell, MD;  Location: [place];  Service: Vascular;  Laterality: Left;  . Patch angioplasty Left 04/04/2012    Procedure: PATCH ANGIOPLASTY;  Surgeon: Serafina Mitchell, MD;  Location: [place];  Service: Vascular;  Laterality: Left;  Marland Kitchen Muscle flap closure Left 04/04/2012    Procedure: MUSCLE FLAP CLOSURE;  Surgeon: Theodoro Kos, DO;  Location: [facility] [place];  Service: Plastics;  Laterality: Left;   FH  Family History  Problem Relation Age of Onset  . Cancer Mother   . Depression Mother   . Heart disease Father     before age 67  . Diabetes Father   . Hypertension Father   . Hyperlipidemia Father   . Heart attack Father   . Cancer Maternal Grandmother    SH  reports that she quit smoking about 16 months ago. Her smoking use included  Cigarettes. She started smoking about 17 months ago. She has a 40 pack-year smoking history. She has never used smokeless tobacco. She reports that she does not drink alcohol or use illicit drugs. Allergies  Allergies  Allergen Reactions  . Celebrex [Celecoxib] Shortness Of Breath  . Codeine Nausea Only   Home medications Prior to Admission medications   Medication Sig Start Date End Date Taking? Authorizing Provider  albuterol (PROVENTIL HFA;VENTOLIN HFA) 108 (90 BASE) MCG/ACT inhaler Inhale 2 puffs into the lungs every 6 (six) hours as needed. For wheezing and shortness of breath.   Yes Historical Provider, MD  albuterol (PROVENTIL) (5  MG/ML) 0.5% nebulizer solution Take 2.5 mg by nebulization every 6 (six) hours as needed. For wheezing and shortness of breath.   Yes Historical Provider, MD  aspirin EC 325 MG tablet Take 325 mg by mouth daily.   Yes Historical Provider, MD  BD PEN NEEDLE NANO U/F 32G X 4 MM MISC  02/02/13  Yes Historical Provider, MD  buPROPion (WELLBUTRIN XL) 300 MG 24 hr tablet Take 300 mg by mouth daily.   Yes Historical Provider, MD  calcium carbonate (OS-CAL) 600 MG TABS Take 600 mg by mouth daily with breakfast.    Yes Historical Provider, MD  cephALEXin (KEFLEX) 500 MG capsule Take 500 mg by mouth 2 (two) times daily.   Yes Historical Provider, MD  Cyanocobalamin (VITAMIN B 12 PO) Take 250 mg by mouth daily.   Yes Historical Provider, MD  DULoxetine (CYMBALTA) 60 MG capsule Take 60 mg by mouth daily.   Yes Historical Provider, MD  ferrous fumarate (HEMOCYTE - 106 MG FE) 325 (106 FE) MG TABS Take 1 tablet by mouth.   Yes Historical Provider, MD  gabapentin (NEURONTIN) 600 MG tablet Take 600 mg by mouth 3 (three) times daily.   Yes Historical Provider, MD  Glucose Blood (BAYER BREEZE 2 TEST) DISK 1 each 3 (three) times daily.   Yes Historical Provider, MD  indomethacin (INDOCIN) 50 MG capsule Take 50 mg by mouth 2 (two) times daily as needed (for gout).   Yes Historical Provider, MD  insulin detemir (LEVEMIR) 100 UNIT/ML injection Inject 48 Units into the skin daily.   Yes Historical Provider, MD  lansoprazole (PREVACID) 30 MG capsule Take 30 mg by mouth 2 (two) times daily.   Yes Historical Provider, MD  losartan (COZAAR) 50 MG tablet Take 50 mg by mouth daily.   Yes Historical Provider, MD  Multiple Vitamin (MULTIVITAMIN) tablet Take 1 tablet by mouth daily.   Yes Historical Provider, MD  omeprazole (PRILOSEC) 40 MG capsule Take 40 mg by mouth 2 (two) times daily.   Yes Historical Provider, MD  oxyCODONE-acetaminophen (PERCOCET/ROXICET) 5-325 MG per tablet Take 1 tablet by mouth every 6 (six) hours as needed  for severe pain.   Yes Historical Provider, MD  promethazine (PHENERGAN) 25 MG tablet Take 50 mg by mouth every 6 (six) hours as needed for nausea. For nausea   Yes Historical Provider, MD  simvastatin (ZOCOR) 20 MG tablet Take 20 mg by mouth every evening.   Yes Historical Provider, MD  sitaGLIPtin (JANUVIA) 50 MG tablet Take 50 mg by mouth daily.   Yes Historical Provider, MD  sitaGLIPtin-metformin (JANUMET) 50-1000 MG per tablet Take 1 tablet by mouth 2 (two) times daily with a meal.   Yes Historical Provider, MD  sulfamethoxazole-trimethoprim (BACTRIM DS) 800-160 MG per tablet Take 1 tablet by mouth 2 (two) times daily. 03/03/13  Yes Historical Provider, MD  topiramate (TOPAMAX) 50 MG tablet Take 50 mg by mouth daily.    Yes Historical Provider, MD  traMADol (ULTRAM) 50 MG tablet Take 50 mg by mouth every 6 (six) hours as needed for moderate pain.   Yes Historical Provider, MD    Current Medications Current Facility-Administered Medications  Medication Dose Route Frequency Provider Last Rate Last Dose  . acetaminophen (TYLENOL) solution 650 mg  650 mg Per Tube Q6H PRN Brand Males, MD   650 mg at 03/06/13 1535  . acetaminophen (TYLENOL) tablet 650 mg  650 mg Oral Q6H PRN Orson Eva, MD       Or  . acetaminophen (TYLENOL) suppository 650 mg  650 mg Rectal Q6H PRN Orson Eva, MD      . albuterol (PROVENTIL) (2.5 MG/3ML) 0.083% nebulizer solution 2.5 mg  2.5 mg Nebulization Q4H PRN Donita Brooks, NP   2.5 mg at 03/06/13 0332  . antiseptic oral rinse (BIOTENE) solution 15 mL  15 mL Mouth Rinse QID Brand Males, MD   15 mL at 03/07/13 1200  . chlorhexidine (PERIDEX) 0.12 % solution 15 mL  15 mL Mouth Rinse BID Brand Males, MD   15 mL at 03/07/13 0758  . feeding supplement (PRO-STAT SUGAR FREE 64) liquid 30 mL  30 mL Per Tube Q1500 Dalene Carrow, RD   30 mL at 03/06/13 1548  . feeding supplement (VITAL HIGH PROTEIN) liquid 1,000 mL  1,000 mL Per Tube Continuous Henreitta Cea, PA-C 65 mL/hr at 03/07/13 0830 1,000 mL at 03/07/13 0830  . fentaNYL (SUBLIMAZE) 10 mcg/mL in sodium chloride 0.9 % 250 mL infusion  0-400 mcg/hr Intravenous Continuous Donita Brooks, NP 25 mL/hr at 03/07/13 1055 250 mcg/hr at 03/07/13 1055  . fentaNYL (SUBLIMAZE) bolus via infusion 50-100 mcg  50-100 mcg Intravenous Q1H PRN Donita Brooks, NP   100 mcg at 03/07/13 0550  . heparin injection 5,000 Units  5,000 Units Subcutaneous Q8H Henreitta Cea, PA-C      . insulin aspart (novoLOG) injection 0-20 Units  0-20 Units Subcutaneous Q4H Donita Brooks, NP   11 Units at 03/07/13 1130  . midazolam (VERSED) injection 2 mg  2 mg Intravenous Q2H PRN Donita Brooks, NP   2 mg at 03/07/13 0518  . norepinephrine (LEVOPHED) 8 mg in dextrose 5 % 250 mL infusion  2-50 mcg/min Intravenous Titrated Juanito Doom, MD   2 mcg/min at 03/07/13 [phone]  . ondansetron (ZOFRAN) tablet 4 mg  4 mg Oral Q6H PRN Orson Eva, MD       Or  . ondansetron [facility]) injection 4 mg  4 mg Intravenous Q6H PRN Orson Eva, MD      . pantoprazole sodium (PROTONIX) 40 mg/20 mL oral suspension 40 mg  40 mg Per Tube QHS Brand Males, MD      . piperacillin-tazobactam (ZOSYN) IVPB 3.375 g  3.375 g Intravenous Q8H Orson Eva, MD   3.375 g at 03/07/13 0517  . sodium bicarbonate 150 mEq in dextrose 5 % 1,000 mL infusion   Intravenous Continuous Chesley Mires, MD 75 mL/hr at 03/07/13 0600    . sodium chloride 0.9 % injection 3 mL  3 mL Intravenous Q12H Orson Eva, MD   3 mL at 03/07/13 1057  . vancomycin (VANCOCIN) 1,750 mg in sodium chloride 0.9 % 500 mL IVPB  1,750 mg Intravenous Q24H Crystal Stillinger Cornelia, RPH   1,750 mg at 03/06/13 1438  . [START ON 03/09/2013] vancomycin (VANCOCIN) 1,750  mg in sodium chloride 0.9 % 500 mL IVPB  1,750 mg Intravenous Q48H Crystal [place], [facility]        CBC  Recent Labs Lab 03/05/13 1235 03/06/13 0410 03/07/13 0825  WBC 25.7* 29.9* 18.9*  NEUTROABS 22.6*  --   --   HGB 13.0  12.3 10.7*  HCT 36.4 36.4 31.9*  MCV 93.8 96.6 97.3  PLT 384 387 [id number]   Basic Metabolic Panel  Recent Labs Lab 03/05/13 1235 03/05/13 2247 03/06/13 0410 03/06/13 1118 03/06/13 2150 03/07/13 0825  NA 131* 132* 135*  --  134* 136*  K 5.2 4.6 4.1  --  4.4 3.9  CL 97 98 100  --  99 100  CO2 18* 16* 18*  --  17* 20  GLUCOSE 359* 393* 276*  --  358* 249*  BUN 28* 32* 34*  --  47* 54*  CREATININE 1.79* 2.41* 2.74*  --  4.14* 4.96*  CALCIUM 8.7 8.5 8.2*  --  7.2* 7.2*  PHOS  --   --   --  4.2 5.1* 3.6    Physical Exam  Blood pressure 109/43, pulse 89, temperature 100.1 F (37.8 C), temperature source Oral, resp. rate 30, height 5\' 7"  (1.702 m), weight 128.5 kg (283 lb 4.7 oz), SpO2 94.00%. GEN: obese, intubated, sedated ENT: hirsuit, ETT in place EYES: eyes closed CV: RRR, nl s1s2 PULM: coarse bs b/l ABD: s/nt/nd, obese SKIN: no rashes/lesions GU: foley catheter PSYCH: unable to assess EXT:1+ LEE   Assessment 28F with AKI in setting of CKD3 (BL 1.2-1.8) 2/2 ATN in setting of hypoxic/hypercapneic VDRF, necrotizing L buttock skin infection, and septic shock.    1. AKI 2/2 ATN, anuric 2/2 sepsis + ?TMP/SMS, ARB, NSAID 2. Inc AG Metabolic Acidosis + Resp Acidosis (failure to compensate) 3. CKD3 4. Septic Shock 2/2 polymicrobial / E.coli necrotizing skin infection on Vanc/Zosyn 5. Hypoxic and Hypercapneic VDRF 6. Morbid Obesity 7. Uncontrolled DM2, admit A1c 11.3%  PLAN 1. Agree with NaHCO3 gtt 2. K/HCO3 stable, but volume status and dense AKI make CRRT possible within next 24h.  If renal function and UOP worsened tomorrow, would place HD Catheter 3. Repeat UA 4. Renal US 5. Appears relatively volume replete, BP support with pressors per CCM 6. Will follow closely 7. BID Renal Panel 8. No NSAIDS, judicious IV contrast 9. ABX per CCM  Pearson Grippe MD 03/07/2013, 1:07 PM

## 2013-03-08 ENCOUNTER — Inpatient Hospital Stay (HOSPITAL_COMMUNITY): Payer: Medicare Other

## 2013-03-08 DIAGNOSIS — N179 Acute kidney failure, unspecified: Secondary | ICD-10-CM | POA: Diagnosis not present

## 2013-03-08 LAB — CBC
HCT: 29 % — ABNORMAL LOW (ref 36.0–46.0)
Hemoglobin: 9.7 g/dL — ABNORMAL LOW (ref 12.0–15.0)
MCH: 32.2 pg (ref 26.0–34.0)
MCHC: 33.4 g/dL (ref 30.0–36.0)
MCV: 96.3 fL (ref 78.0–100.0)
PLATELETS: 375 10*3/uL (ref 150–400)
RBC: 3.01 MIL/uL — ABNORMAL LOW (ref 3.87–5.11)
RDW: 13.9 % (ref 11.5–15.5)
WBC: 15.8 10*3/uL — ABNORMAL HIGH (ref 4.0–10.5)

## 2013-03-08 LAB — BLOOD GAS, ARTERIAL
Acid-base deficit: 2 mmol/L (ref 0.0–2.0)
BICARBONATE: 22 meq/L (ref 20.0–24.0)
Drawn by: 24513
FIO2: 40 %
O2 Saturation: 93.6 %
PCO2 ART: 37.5 mmHg (ref 35.0–45.0)
PEEP: 5 cmH2O
PH ART: 7.392 (ref 7.350–7.450)
Patient temperature: 100.4
Pressure control: 25 cmH2O
RATE: 30 resp/min
TCO2: 23.1 mmol/L (ref 0–100)
pO2, Arterial: 74.4 mmHg — ABNORMAL LOW (ref 80.0–100.0)

## 2013-03-08 LAB — MAGNESIUM
Magnesium: 2.1 mg/dL (ref 1.5–2.5)
Magnesium: 2.1 mg/dL (ref 1.5–2.5)
Magnesium: 2.3 mg/dL (ref 1.5–2.5)

## 2013-03-08 LAB — BASIC METABOLIC PANEL
BUN: 69 mg/dL — ABNORMAL HIGH (ref 6–23)
CALCIUM: 6.9 mg/dL — AB (ref 8.4–10.5)
CO2: 21 mEq/L (ref 19–32)
CREATININE: 6.37 mg/dL — AB (ref 0.50–1.10)
Chloride: 97 mEq/L (ref 96–112)
GFR calc Af Amer: 8 mL/min — ABNORMAL LOW (ref >90)
GFR calc non Af Amer: 7 mL/min — ABNORMAL LOW (ref >90)
Glucose, Bld: 265 mg/dL — ABNORMAL HIGH (ref 70–99)
Potassium: 3.6 mEq/L — ABNORMAL LOW (ref 3.7–5.3)
Sodium: 137 mEq/L (ref 137–147)

## 2013-03-08 LAB — CULTURE, ROUTINE-ABSCESS

## 2013-03-08 LAB — POCT ACTIVATED CLOTTING TIME
ACTIVATED CLOTTING TIME: 138 s
Activated Clotting Time: 138 seconds
Activated Clotting Time: 143 seconds
Activated Clotting Time: 149 seconds
Activated Clotting Time: 155 seconds

## 2013-03-08 LAB — GLUCOSE, CAPILLARY
GLUCOSE-CAPILLARY: 250 mg/dL — AB (ref 70–99)
GLUCOSE-CAPILLARY: 253 mg/dL — AB (ref 70–99)
GLUCOSE-CAPILLARY: 190 mg/dL — AB (ref 70–99)
Glucose-Capillary: 234 mg/dL — ABNORMAL HIGH (ref 70–99)
Glucose-Capillary: 265 mg/dL — ABNORMAL HIGH (ref 70–99)
Glucose-Capillary: 269 mg/dL — ABNORMAL HIGH (ref 70–99)

## 2013-03-08 LAB — PHOSPHORUS
PHOSPHORUS: 3.4 mg/dL (ref 2.3–4.6)
PHOSPHORUS: 4 mg/dL (ref 2.3–4.6)
Phosphorus: 3.1 mg/dL (ref 2.3–4.6)
Phosphorus: 3.7 mg/dL (ref 2.3–4.6)

## 2013-03-08 MED ORDER — PRISMASOL BGK 4/2.5 32-4-2.5 MEQ/L IV SOLN
INTRAVENOUS | Status: DC
  Administered 2013-03-08 – 2013-03-10 (×5): via INTRAVENOUS_CENTRAL
  Filled 2013-03-08 (×9): qty 5000

## 2013-03-08 MED ORDER — CIPROFLOXACIN IN D5W 400 MG/200ML IV SOLN
400.0000 mg | Freq: Two times a day (BID) | INTRAVENOUS | Status: DC
  Administered 2013-03-08 – 2013-03-14 (×13): 400 mg via INTRAVENOUS
  Filled 2013-03-08 (×13): qty 200

## 2013-03-08 MED ORDER — HEPARIN BOLUS VIA INFUSION (CRRT)
1000.0000 [IU] | INTRAVENOUS | Status: DC | PRN
  Administered 2013-03-08 (×2): 1000 [IU] via INTRAVENOUS_CENTRAL
  Filled 2013-03-08: qty 1000

## 2013-03-08 MED ORDER — PRISMASOL BGK 4/2.5 32-4-2.5 MEQ/L IV SOLN
INTRAVENOUS | Status: DC
  Administered 2013-03-08 – 2013-03-11 (×22): via INTRAVENOUS_CENTRAL
  Filled 2013-03-08 (×33): qty 5000

## 2013-03-08 MED ORDER — INSULIN GLARGINE 100 UNIT/ML ~~LOC~~ SOLN
10.0000 [IU] | Freq: Every day | SUBCUTANEOUS | Status: DC
  Administered 2013-03-08 – 2013-03-09 (×2): 10 [IU] via SUBCUTANEOUS
  Filled 2013-03-08 (×2): qty 0.1

## 2013-03-08 MED ORDER — HEPARIN SODIUM (PORCINE) 1000 UNIT/ML DIALYSIS
1000.0000 [IU] | INTRAMUSCULAR | Status: DC | PRN
  Filled 2013-03-08: qty 6
  Filled 2013-03-08: qty 3

## 2013-03-08 MED ORDER — PRISMASOL BGK 4/2.5 32-4-2.5 MEQ/L IV SOLN
INTRAVENOUS | Status: DC
  Administered 2013-03-09 – 2013-03-10 (×3): via INTRAVENOUS_CENTRAL
  Filled 2013-03-08 (×9): qty 5000

## 2013-03-08 MED ORDER — SODIUM CHLORIDE 0.9 % IJ SOLN
500.0000 [IU]/h | INTRAMUSCULAR | Status: DC
  Administered 2013-03-08: 250 [IU]/h via INTRAVENOUS_CENTRAL
  Administered 2013-03-09: 1050 [IU]/h via INTRAVENOUS_CENTRAL
  Administered 2013-03-09: 2250 [IU]/h via INTRAVENOUS_CENTRAL
  Administered 2013-03-09: 1450 [IU]/h via INTRAVENOUS_CENTRAL
  Administered 2013-03-10: 2400 [IU]/h via INTRAVENOUS_CENTRAL
  Administered 2013-03-10 (×2): 2500 [IU]/h via INTRAVENOUS_CENTRAL
  Administered 2013-03-10: 2400 [IU]/h via INTRAVENOUS_CENTRAL
  Administered 2013-03-11 (×2): 2500 [IU]/h via INTRAVENOUS_CENTRAL
  Filled 2013-03-08 (×11): qty 2

## 2013-03-08 NOTE — Progress Notes (Signed)
Admit: 03/05/2013 LOS: 3  Stephanie Peck with AKI in setting of CKD3 (BL 1.2-1.8) 2/2 ATN in setting of hypoxic/hypercapneic VDRF, necrotizing L buttock skin infection, and septic shock.    Subjective:  Off pressors, soft BPs Minimal UOP, worsened SCr Still on HCO3 gtt 40% FIO2, PEEP 5 No plans for OR today Renal US unremarkable.  No obstruction.  Normal size  02/07 0701 - 02/08 0700 In: 3968.7 [I.V.:2303.7; NG/GT:1015; IV Piggyback:650] Out: 158 [Urine:158]  Filed Weights   03/06/13 0500 03/07/13 0500 03/08/13 0500  Weight: 126.6 kg (279 lb 1.6 oz) 128.5 kg (283 lb 4.7 oz) 132.5 kg (292 lb 1.8 oz)    Current meds: reviewed  Current Labs: reviewed    Physical Exam:  Blood pressure 98/45, pulse 81, temperature 98.7 F (37.1 C), temperature source Oral, resp. rate 30, height 5\' 7"  (1.702 m), weight 132.5 kg (292 lb 1.8 oz), SpO2 92.00%. GEN: obese, intubated, sedated  ENT: hirsuit, ETT in place  EYES: eyes closed  CV: RRR, nl s1s2  PULM: coarse bs b/l  ABD: s/nt/nd, obese  SKIN: no rashes/lesions  GU: foley catheter  PSYCH: unable to assess  EXT:1+ LEE   Assessment  Stephanie Peck with AKI in setting of CKD3 (BL 1.2-1.8) 2/2 ATN in setting of hypoxic/hypercapneic VDRF, necrotizing L buttock skin infection, and septic shock.  1. AKI 2/2 ATN, anuric 2/2 sepsis + ?TMP/SMX, ARB, NSAID 2. Inc AG Metabolic Acidosis + Resp Acidosis (failure to compensate) 3. CKD3 4. Septic Shock 2/2 polymicrobial / E.coli necrotizing skin infection on Vanc/Zosyn 5. Hypoxic and Hypercapneic VDRF 6. Morbid Obesity 7. Uncontrolled DM2, admit A1c 11.3% PLAN  1. Start CRRT today with some attempted UF given no pressors and grossly overloaded 2. CCM to place HD Catheter 3. I don't think she would tolerate intermittent treatment 4. Will follow closely 5. BID Renal Panel 6. No NSAIDS, judicious IV contrast 7. ABX per CCM  Pearson Grippe MD 03/08/2013, 8:43 AM   Recent Labs Lab 03/07/13 0825 03/07/13 1600  03/07/13 2318 03/08/13 0500  NA 136* 136*  --  137  K 3.9 3.9  --  3.6*  CL 100 99  --  97  CO2 20 20  --  21  GLUCOSE 249* 307*  --  265*  BUN 54* 58*  --  69*  CREATININE 4.96* 5.51*  --  6.37*  CALCIUM 7.2* 6.9*  --  6.9*  PHOS 3.6 3.4 3.1 3.4    Recent Labs Lab 03/05/13 1235 03/06/13 0410 03/07/13 0825 03/08/13 0500  WBC 25.7* 29.9* 18.9* 15.8*  NEUTROABS 22.6*  --   --   --   HGB 13.0 12.3 10.7* 9.7*  HCT 36.4 36.4 31.9* 29.0*  MCV 93.8 96.6 97.3 96.3  PLT 384 [phone]

## 2013-03-08 NOTE — Procedures (Signed)
Hemodialysis Catheter Insertion Procedure Note Stephanie Peck [id number] [birth year]  Procedure: Insertion of Hemodialysis Catheter Indications: Hemodialysis  Procedure Details Consent: Risks of procedure as well as the alternatives and risks of each were explained to the (patient/caregiver).  Consent for procedure obtained. Time Out: Verified patient identification, verified procedure, site/side was marked, verified correct patient position, special equipment/implants available, medications/allergies/relevent history reviewed, required imaging and test results available.  Performed  Maximum sterile technique was used including antiseptics, cap, gloves, gown, hand hygiene, mask and sheet. Skin prep: Chlorhexidine; local anesthetic administered A antimicrobial bonded/coated triple lumen HD catheter was placed in the left internal jugular vein using the Seldinger technique.  Evaluation Blood flow good Complications: No apparent complications Patient did tolerate procedure well. Chest X-ray ordered to verify placement.  CXR: pending.  Placed using ultrasound guidance under direct MD supervision.   [name], NP 03/08/2013, 2:59 PM  Baltazar Apo, MD, PhD 03/08/2013, 3:24 PM [facility] [phone] or if no answer [phone]

## 2013-03-08 NOTE — Progress Notes (Signed)
[facility]    Name: Stephanie Peck MRN: [id number] DOB: 08/24/1960     ADMISSION DATE: 03/05/2013  CONSULTATION DATE: 03/05/13  REFERRING MD : [facility]-->PCCM (2/5)   CHIEF COMPLAINT: Acute Respiratory Failure / Perirectal abscess & Sepsis   BRIEF PATIENT DESCRIPTION: [name] , with PMH of DM, GERD, Anxiety, COPD, Severe OSA, COPD, Morbid Obesity who presented to [facility] ER on 2/5 with a 4 day history of worsening left buttock abscess. CT of pelvis demonstrated multiple fluid dense areas within the subq fat of left buttock, stranding in the fat extending to anus compatible with cellulitis, and focal hemorrhage in left inguinal region, adjacent soft tissue mass.   SIGNIFICANT EVENTS / STUDIES:  2/5 - Admit with 4 day hx of worsening abscess on L buttock, fevers, chills. To OR per CCS. Returned to ICU on vent.  2/5 - CT Pelvis: multiple fluid dense areas within the subq fat of left buttock, stranding in the fat extending to anus compatible with cellulitis, and focal hemorrhage in left inguinal region, adjacent soft tissue mass (?lymph node vs additional small hemorrhage). 2/6 2D echo>>> EF55-60%,  Mild AS, trivial pericardial effusion,   LINES / TUBES:  OETT 2/5>>> Rt IJ CVL 2/5>>>  CULTURES:  BCx2 2/5>>>  Wound Culture 2/5>>> EColi  Abscess culture (OR) 2/5>>>GNR>>>EColi  Flu 2/5>>> neg  ANTIBIOTICS:  Vanco 2/5>>> 2/7 Zosyn 2/5>>> 2/8 Cipro 2/8>>>  SUBJECTIVE:  Worsening renal failure, no UOP.  No plans for return to OR at this time.   VITAL SIGNS: Temp:  [98.7 F (37.1 C)-100.4 F (38 C)] 98.7 F (37.1 C) (02/08 0724) Pulse Rate:  [79-104] 95 (02/08 0900) Resp:  [19-30] 25 (02/08 0900) BP: (83-137)/(43-63) 120/52 mmHg (02/08 0800) SpO2:  [89 %-97 %] 89 % (02/08 0900) Arterial Line BP: (100-174)/(39-69) 163/67 mmHg (02/08 0900) FiO2 (%):  [40 %-60 %] 40 % (02/08 0900) Weight:  [292 lb 1.8 oz (132.5 kg)] 292 lb 1.8 oz (132.5 kg) (02/08 0500) HEMODYNAMICS: CVP:  [12 mmHg-14 mmHg]  12 mmHg VENTILATOR SETTINGS: Vent Mode:  [-] PCV FiO2 (%):  [40 %-60 %] 40 % Set Rate:  [30 bmp] 30 bmp PEEP:  [5 cmH20] 5 cmH20 Plateau Pressure:  [24 cmH20-26 cmH20] 26 cmH20 INTAKE / OUTPUT: Intake/Output     02/07 0701 - 02/08 0700 02/08 0701 - 02/09 0700   I.V. (mL/kg) 2403.7 (18.1) 200 (1.5)   NG/GT 1015    IV Piggyback 650    Total Intake(mL/kg) 4068.7 (30.7) 200 (1.5)   Urine (mL/kg/hr) 158 (0) 60 (0.1)   Total Output 158 60   Net +3910.7 +140         PHYSICAL EXAMINATION: General: Morbidly obese female in NAD on vent  Neuro: sedated on vent, RASS -2, opens eyes to loud voice HEENT: Mm pink/moist, OETT, short / thick neck  Cardiovascular: s1s2 rrr, no m/r/g, distant tones  Lungs: resp's even/non-labored on vent, very diminished bases   Abdomen: Obese, soft, bsx4 active  Musculoskeletal: No acute deformties  Skin: Warm/dry, L buttock dressing c/d/i, erythematous perineum   LABS:  CBC  Recent Labs Lab 03/06/13 0410 03/07/13 0825 03/08/13 0500  WBC 29.9* 18.9* 15.8*  HGB 12.3 10.7* 9.7*  HCT 36.4 31.9* 29.0*  PLT 387 361 375   Coag's  Recent Labs Lab 03/06/13 1052  APTT 39*  INR 1.28   BMET  Recent Labs Lab 03/07/13 0825 03/07/13 1600 03/08/13 0500  NA 136* 136* 137  K 3.9 3.9 3.6*  CL 100 99 97  CO2 20 20 21   BUN 54* 58* 69*  CREATININE 4.96* 5.51* 6.37*  GLUCOSE 249* 307* 265*   Electrolytes  Recent Labs Lab 03/06/13 2150 03/07/13 0825 03/07/13 1600 03/07/13 2318 03/08/13 0500  CALCIUM 7.2* 7.2* 6.9*  --  6.9*  MG 2.0 2.1  --  2.1  --   PHOS 5.1* 3.6 3.4 3.1 3.4   Sepsis Markers  Recent Labs Lab 03/05/13 1235 03/05/13 2248  LATICACIDVEN 1.6 1.3   ABG  Recent Labs Lab 03/06/13 2120 03/07/13 1216 03/08/13 0326  PHART 7.185* 7.328* 7.392  PCO2ART 46.8* 38.2 37.5  PO2ART 142.0* 82.0 74.4*   Liver Enzymes  Recent Labs Lab 03/05/13 1235 03/07/13 1600  AST 37  --   ALT 21  --   ALKPHOS 122*  --   BILITOT 0.3   --   ALBUMIN 2.4* 1.9*   Cardiac Enzymes  Recent Labs Lab 03/05/13 1455 03/05/13 2247 03/06/13 0433  TROPONINI <0.30 <0.30 <0.30  PROBNP 443.4*  --   --    Glucose  Recent Labs Lab 03/07/13 1123 03/07/13 1513 03/07/13 1930 03/08/13 0002 03/08/13 0355 03/08/13 0711  GLUCAP 278* 287* 304* 269* 250* 234*   Urinalysis    Component Value Date/Time   COLORURINE AMBER* 03/07/2013 1538   APPEARANCEUR TURBID* 03/07/2013 1538   LABSPEC 1.029 03/07/2013 1538   PHURINE 5.0 03/07/2013 1538   GLUCOSEU 100* 03/07/2013 1538   HGBUR LARGE* 03/07/2013 1538   BILIRUBINUR MODERATE* 03/07/2013 1538   KETONESUR 15* 03/07/2013 1538   PROTEINUR 30* 03/07/2013 1538   UROBILINOGEN 1.0 03/07/2013 1538   NITRITE NEGATIVE 03/07/2013 1538   LEUKOCYTESUR SMALL* 03/07/2013 1538    Imaging US Renal  03/07/2013   CLINICAL DATA:  Renal insufficiency  EXAM: RENAL ULTRASOUND  COMPARISON:  CT abdomen and pelvis March 31, 2012  FINDINGS: Right kidney measures 12.6 cm in length. Left kidney measures 13.6 cm in length. There is no demonstrable renal mass, pelvicaliectasis, or perinephric fluid collection on either side. There is no apparent renal calculus or ureterectasis on either side. Renal cortical thickness and echogenicity appear normal bilaterally.  Urinary bladder is decompressed with a Foley catheter in cannot be assessed.  IMPRESSION: No renal lesions appreciable.   Electronically Signed   By: Lowella Grip M.D.   On: 03/07/2013 16:11   Dg Chest Port 1 View  03/08/2013   CLINICAL DATA:  Respiratory failure.  EXAM: PORTABLE CHEST - 1 VIEW  COMPARISON:  03/06/2013.  FINDINGS: Stable right jugular catheter. Endotracheal tube in satisfactory position. Interval nasogastric tube extending into the stomach. Stable enlarged cardiac silhouette. Increased bibasilar atelectasis. Mild increase in prominence of the interstitial markings with scattered alveolar opacities.  IMPRESSION: 1. Increased bibasilar atelectasis. 2. Mildly  increased pulmonary edema.   Electronically Signed   By: Enrique Sack M.D.   On: 03/08/2013 07:32   Dg Abd Portable 1v  03/08/2013   CLINICAL DATA:  Abdominal distention  EXAM: PORTABLE ABDOMEN - 1 VIEW  COMPARISON:  03/06/2013  FINDINGS: Scattered large and small bowel gas is noted. A nasogastric catheter is coiled within the stomach. No obstructive changes are seen. Mild degenerative change the lumbar spine is noted.  IMPRESSION: No acute abnormality seen.   Electronically Signed   By: Inez Catalina M.D.   On: 03/08/2013 10:46   Dg Abd Portable 1v  03/06/2013   CLINICAL DATA:  Placement of orogastric tube  EXAM: PORTABLE ABDOMEN - 1 VIEW  COMPARISON:  March 05, 2013 CT pelvis  FINDINGS: Orogastric tube tip and side port are in the stomach. There is somewhat of a paucity of gas. There is atelectatic change in the lung bases. No free air is seen on this supine examination.  IMPRESSION: Orogastric tube tip and side port in stomach. Relative paucity of gas. While this finding may be seen normally, it also may be seen with enteritis or early ileus. Obstruction felt to be less likely. No free air seen on this supine examination.   Electronically Signed   By: Lowella Grip M.D.   On: 03/06/2013 11:34     ASSESSMENT / PLAN:  PULMONARY  A:  Acute Respiratory Failure  COPD  OSA  P:  -continue vent with daily SBT and PS wean - no definitive plans for return to OR so can wean with goal to extubate when more stable overall  -f/u ABG  -f/u CXR  -will need CPAP QHS post extubation   CARDIOVASCULAR  A:  CHF Hypotension - improved  Shock, etiology, likely septic  P:  -continue gentle volume  -monitor CVP  -hold home cozaar  -levophed to map goal -hold stress steroids for now - cortisol ok   -cvp to goal 12  RENAL  A:  Acute Renal failure - ATN likely - in setting of sepsis (CKD stage 3) Hyperkalemia improved Metabolic acidosis  Worsening UOP  P:  -allow pos balance  -f/u ABG  -hold  nephrotoxic agents  -cont HCO3 gtt  -place HD cath and CVVHD per Renal 2/8   GASTROINTESTINAL  A:  GERD  Hx Gastric Bypass  P:  -protonix while intubated, not on PPI as outpt  -Continue TF  HEMATOLOGIC  A:  Chronic Anemia  coags ok  P:  -hold PO iron  -SCD's / heparin for DVT    INFECTIOUS  A:  Left Buttock Abscess / Perirectal Cellulitis- s/p I&D 2/5.  Ecoli in both cultures- both pan sens  P:  -abx & cultures as above  -??return to OR in next few days - will discuss with CCS -d/c vanc, zosyn and started cipro   ENDOCRINE  A:  Diabetes  Gout  P:  -SSI with resistant scale  -add lantus 2/8 -hold januvia, janumet, indocin   NEUROLOGIC  A:  Pain  Acute Encephalopathy  Depression / Anxiety  P:  -Fentanyl gtt for pain  -PRN versed for sedation needs  -hold cymbalta, wellbutrin, neurontin, topamax     I have personally obtained a history, examined the patient, evaluated laboratory and imaging results, formulated the assessment and plan and placed orders.  CRITICAL CARE: The patient is critically ill with multiple organ systems failure and requires high complexity decision making for assessment and support, frequent evaluation and titration of therapies, application of advanced monitoring technologies and extensive interpretation of multiple databases. Critical Care Time devoted to patient care services described in this note is 45 minutes.   [name], NP 03/08/2013  10:53 AM Pager: [phone] or [phone]  *Care during the described time interval was provided by me and/or other providers on the critical care team. I have reviewed this patient's available data, including medical history, events of note, physical examination and test results as part of my evaluation.  Baltazar Apo, MD, PhD 03/08/2013, 3:14 PM [facility] [phone] or if no answer [phone]

## 2013-03-08 NOTE — Progress Notes (Signed)
Wound - several areas of cautery in middle of wound. Anterior edge near perineum has some minimal necrosis; would not go back to OR for debridement yet.  This may clean up with dressing changes  Consider hydrotherapy  Imogene Burn. Georgette Dover, MD, [facility]  General/ Trauma Surgery  03/08/2013 8:18 AM

## 2013-03-08 NOTE — Progress Notes (Signed)
Patient ID: Stephanie Peck, female   DOB: 1960-07-29, 53 y.o.   MRN: [id number] 3 Days Post-Op  Subjective: Pt overall improved today except kidneys   Objective: Vital signs in last 24 hours: Temp:  [98.7 F (37.1 C)-100.4 F (38 C)] 98.7 F (37.1 C) (02/08 0724) Pulse Rate:  [79-104] 81 (02/08 0740) Resp:  [19-30] 30 (02/08 0740) BP: (83-137)/(43-63) 98/45 mmHg (02/08 0740) SpO2:  [90 %-97 %] 92 % (02/08 0740) Arterial Line BP: (100-174)/(39-65) 174/65 mmHg (02/08 0600) FiO2 (%):  [40 %-60 %] 40 % (02/08 0740) Weight:  [292 lb 1.8 oz (132.5 kg)] 292 lb 1.8 oz (132.5 kg) (02/08 0500) Last BM Date:  (pts)  Intake/Output from previous day: 02/07 0701 - 02/08 0700 In: 3968.7 [I.V.:2303.7; NG/GT:1015; IV Piggyback:650] Out: 158 [Urine:158] Intake/Output this shift:    PE: Skin: wound generally clean, small area of necrosis at the anterior portion of wound.  Some purulent drainage, but minimal.  No other areas of concern  Abd: soft, but seems more distended today with tympany.   Lab Results:   Recent Labs  03/07/13 0825 03/08/13 0500  WBC 18.9* 15.8*  HGB 10.7* 9.7*  HCT 31.9* 29.0*  PLT 361 375   BMET  Recent Labs  03/07/13 1600 03/08/13 0500  NA 136* 137  K 3.9 3.6*  CL 99 97  CO2 20 21  GLUCOSE 307* 265*  BUN 58* 69*  CREATININE 5.51* 6.37*  CALCIUM 6.9* 6.9*   PT/INR  Recent Labs  03/06/13 1052  LABPROT 15.7*  INR 1.28   CMP     Component Value Date/Time   NA 137 03/08/2013 0500   K 3.6* 03/08/2013 0500   CL 97 03/08/2013 0500   CO2 21 03/08/2013 0500   GLUCOSE 265* 03/08/2013 0500   BUN 69* 03/08/2013 0500   CREATININE 6.37* 03/08/2013 0500   CALCIUM 6.9* 03/08/2013 0500   PROT 7.2 03/05/2013 1235   ALBUMIN 1.9* 03/07/2013 1600   AST 37 03/05/2013 1235   ALT 21 03/05/2013 1235   ALKPHOS 122* 03/05/2013 1235   BILITOT 0.3 03/05/2013 1235   GFRNONAA 7* 03/08/2013 0500   GFRAA 8* 03/08/2013 0500   Lipase     Component Value Date/Time   LIPASE 12 02/04/2007 2219        Studies/Results: US Renal  03/07/2013   CLINICAL DATA:  Renal insufficiency  EXAM: RENAL ULTRASOUND  COMPARISON:  CT abdomen and pelvis March 31, 2012  FINDINGS: Right kidney measures 12.6 cm in length. Left kidney measures 13.6 cm in length. There is no demonstrable renal mass, pelvicaliectasis, or perinephric fluid collection on either side. There is no apparent renal calculus or ureterectasis on either side. Renal cortical thickness and echogenicity appear normal bilaterally.  Urinary bladder is decompressed with a Foley catheter in cannot be assessed.  IMPRESSION: No renal lesions appreciable.   Electronically Signed   By: Lowella Grip M.D.   On: 03/07/2013 16:11   Dg Chest Port 1 View  03/08/2013   CLINICAL DATA:  Respiratory failure.  EXAM: PORTABLE CHEST - 1 VIEW  COMPARISON:  03/06/2013.  FINDINGS: Stable right jugular catheter. Endotracheal tube in satisfactory position. Interval nasogastric tube extending into the stomach. Stable enlarged cardiac silhouette. Increased bibasilar atelectasis. Mild increase in prominence of the interstitial markings with scattered alveolar opacities.  IMPRESSION: 1. Increased bibasilar atelectasis. 2. Mildly increased pulmonary edema.   Electronically Signed   By: Enrique Sack M.D.   On: 03/08/2013 07:32   Dg  Abd Portable 1v  03/06/2013   CLINICAL DATA:  Placement of orogastric tube  EXAM: PORTABLE ABDOMEN - 1 VIEW  COMPARISON:  March 05, 2013 CT pelvis  FINDINGS: Orogastric tube tip and side port are in the stomach. There is somewhat of a paucity of gas. There is atelectatic change in the lung bases. No free air is seen on this supine examination.  IMPRESSION: Orogastric tube tip and side port in stomach. Relative paucity of gas. While this finding may be seen normally, it also may be seen with enteritis or early ileus. Obstruction felt to be less likely. No free air seen on this supine examination.   Electronically Signed   By: Lowella Grip M.D.    On: 03/06/2013 11:34    Anti-infectives: Anti-infectives   Start     Dose/Rate Route Frequency Ordered Stop   03/09/13 1400  vancomycin (VANCOCIN) 1,750 mg in sodium chloride 0.9 % 500 mL IVPB     1,750 mg 250 mL/hr over 120 Minutes Intravenous Every 48 hours 03/07/13 1050     03/06/13 1400  vancomycin (VANCOCIN) 1,750 mg in sodium chloride 0.9 % 500 mL IVPB     1,750 mg 250 mL/hr over 120 Minutes Intravenous Every 24 hours 03/05/13 1650 03/07/13 1511   03/05/13 2030  piperacillin-tazobactam (ZOSYN) IVPB 3.375 g     3.375 g 12.5 mL/hr over 240 Minutes Intravenous 3 times per day 03/05/13 1637     03/05/13 1700  vancomycin (VANCOCIN) IVPB 1000 mg/200 mL premix     1,000 mg 200 mL/hr over 60 Minutes Intravenous NOW 03/05/13 1650 03/05/13 2027   03/05/13 1200  vancomycin (VANCOCIN) IVPB 1000 mg/200 mL premix     1,000 mg 200 mL/hr over 60 Minutes Intravenous  Once 03/05/13 1148 03/05/13 1453   03/05/13 1200  piperacillin-tazobactam (ZOSYN) IVPB 3.375 g     3.375 g 100 mL/hr over 30 Minutes Intravenous  Once 03/05/13 1148 03/05/13 1348       Assessment/Plan  1.  POD 3, s/p I&D of buttock  2. ARF 3. VDRF 4. Septic shock, overall improving  Plan: 1. Abdomen seems more distended today.  Will check a plain x-ray.  If developing ileus, may need to hold TFs to reduce risk of aspiration. 2. Cont wound care.  Does not need return to OR today. 3. May wean to extubate as able per CCM 4. Appears to need HD cath given UOP and Cr  LOS: 3 days    Stephanie Peck E 03/08/2013, 8:00 AM Pager: [phone]

## 2013-03-08 NOTE — Procedures (Signed)
I was present at this dialysis session. I have reviewed the session itself and made appropriate changes.   Inc DFR to 2000 to improve dose of dialysis.  [facility] HD Catheter.   Pearson Grippe  MD 03/08/2013, 4:55 PM

## 2013-03-08 NOTE — Progress Notes (Signed)
ANTIBIOTIC CONSULT NOTE - INITIAL  Pharmacy Consult for Cipro Indication: Ecoli in gluteal abscess  Allergies  Allergen Reactions  . Celebrex [Celecoxib] Shortness Of Breath  . Codeine Nausea Only    Patient Measurements: Height: 5\' 7"  (170.2 cm) Weight: 292 lb 1.8 oz (132.5 kg) IBW/kg (Calculated) : 61.6 Adjusted Body Weight:   Vital Signs: Temp: 98.7 F (37.1 C) (02/08 0724) Temp src: Oral (02/08 0724) BP: 120/52 mmHg (02/08 0800) Pulse Rate: 95 (02/08 0900) Intake/Output from previous day: 02/07 0701 - 02/08 0700 In: 4068.7 [I.V.:2403.7; NG/GT:1015; IV Piggyback:650] Out: 158 [Urine:158] Intake/Output from this shift: Total I/O In: 200 [I.V.:200] Out: 60 [Urine:60]  Labs:  Recent Labs  03/06/13 0410  03/07/13 0825 03/07/13 1600 03/08/13 0500  WBC 29.9*  --  18.9*  --  15.8*  HGB 12.3  --  10.7*  --  9.7*  PLT 387  --  361  --  375  CREATININE 2.74*  < > 4.96* 5.51* 6.37*  < > = values in this interval not displayed. Estimated Creatinine Clearance: 14.3 ml/min (by C-G formula based on Cr of 6.37). No results found for this basename: VANCOTROUGH, VANCOPEAK, VANCORANDOM, [place], GENTPEAK, GENTRANDOM, TOBRATROUGH, TOBRAPEAK, TOBRARND, AMIKACINPEAK, AMIKACINTROU, AMIKACIN,  in the last 72 hours   Microbiology: Recent Results (from the past 720 hour(s))  CULTURE, BLOOD (ROUTINE X 2)     Status: None   Collection Time    03/05/13 12:37 PM      Result Value Range Status   Specimen Description BLOOD LEFT FOREARM   Final   Special Requests BOTTLES DRAWN AEROBIC AND ANAEROBIC 5CCS   Final   Culture  Setup Time     Final   Value: 03/05/2013 16:28     Performed at [facility]   Culture     Final   Value:        BLOOD CULTURE RECEIVED NO GROWTH TO DATE CULTURE WILL BE HELD FOR 5 DAYS BEFORE ISSUING A FINAL NEGATIVE REPORT     Performed at [facility]   Report Status PENDING   Incomplete  CULTURE, BLOOD (ROUTINE X 2)     Status: None   Collection Time    03/05/13 12:42 PM      Result Value Range Status   Specimen Description BLOOD RIGHT FOREARM   Final   Special Requests BOTTLES DRAWN AEROBIC AND ANAEROBIC 5CCS   Final   Culture  Setup Time     Final   Value: 03/05/2013 16:29     Performed at [facility]   Culture     Final   Value:        BLOOD CULTURE RECEIVED NO GROWTH TO DATE CULTURE WILL BE HELD FOR 5 DAYS BEFORE ISSUING A FINAL NEGATIVE REPORT     Performed at [facility]   Report Status PENDING   Incomplete  WOUND CULTURE     Status: None   Collection Time    03/05/13 12:42 PM      Result Value Range Status   Specimen Description WOUND PERIRECTAL   Final   Special Requests Normal   Final   Gram Stain     Final   Value: NO WBC SEEN     NO SQUAMOUS EPITHELIAL CELLS SEEN     MODERATE GRAM NEGATIVE RODS     Performed at [facility]   Culture     Final   Value: MODERATE ESCHERICHIA COLI     Performed at  [facility]   Report Status 03/07/2013 FINAL   Final   Organism ID, Bacteria ESCHERICHIA COLI   Final  MRSA PCR SCREENING     Status: None   Collection Time    03/05/13  4:10 PM      Result Value Range Status   MRSA by PCR NEGATIVE  NEGATIVE Final   Comment:            The GeneXpert MRSA Assay (FDA     approved for NASAL specimens     only), is one component of a     comprehensive MRSA colonization     surveillance program. It is not     intended to diagnose MRSA     infection nor to guide or     monitor treatment for     MRSA infections.  CULTURE, ROUTINE-ABSCESS     Status: None   Collection Time    03/05/13  8:46 PM      Result Value Range Status   Specimen Description ABSCESS PERIRECTAL   Final   Special Requests PATIENT ON FOLLOWING VANCOMYCIN   Final   Gram Stain     Final   Value: MODERATE WBC PRESENT,BOTH PMN AND MONONUCLEAR     RARE SQUAMOUS EPITHELIAL CELLS PRESENT     ABUNDANT GRAM NEGATIVE RODS     FEW GRAM POSITIVE COCCI IN PAIRS     RARE GRAM  POSITIVE RODS   Culture     Final   Value: MODERATE ESCHERICHIA COLI     Performed at [facility]   Report Status 03/08/2013 FINAL   Final   Organism ID, Bacteria ESCHERICHIA COLI   Final  ANAEROBIC CULTURE     Status: None   Collection Time    03/05/13  8:46 PM      Result Value Range Status   Specimen Description ABSCESS PERIRECTAL   Final   Special Requests PATIENT ON FOLLOWING VANCOMYCIN   Final   Gram Stain     Final   Value: MODERATE WBC PRESENT,BOTH PMN AND MONONUCLEAR     RARE SQUAMOUS EPITHELIAL CELLS PRESENT     ABUNDANT GRAM NEGATIVE RODS     FEW GRAM POSITIVE COCCI IN PAIRS     RARE GRAM POSITIVE RODS   Culture     Final   Value: NO ANAEROBES ISOLATED; CULTURE IN PROGRESS FOR 5 DAYS     Performed at [facility]   Report Status PENDING   Incomplete  AFB CULTURE WITH SMEAR     Status: None   Collection Time    03/05/13  8:46 PM      Result Value Range Status   Specimen Description ABSCESS PERIRECTAL   Final   Special Requests PATIENT ON FOLLOWING VANCOMYCIN   Final   ACID FAST SMEAR     Final   Value: NO ACID FAST BACILLI SEEN     Performed at [facility]   Culture     Final   Value: CULTURE WILL BE EXAMINED FOR 6 WEEKS BEFORE ISSUING A FINAL REPORT     Performed at [facility]   Report Status PENDING   Incomplete  FUNGUS CULTURE W SMEAR     Status: None   Collection Time    03/05/13  8:46 PM      Result Value Range Status   Specimen Description ABSCESS PERIRECTAL   Final   Special Requests PATIENT ON FOLLOWING VANCOMYCIN   Final   Fungal  Smear     Final   Value: NO YEAST OR FUNGAL ELEMENTS SEEN     Performed at [facility]   Culture     Final   Value: CULTURE IN PROGRESS FOR FOUR WEEKS     Performed at [facility]   Report Status PENDING   Incomplete    Medical History: Past Medical History  Diagnosis Date  . Diabetes mellitus   . GERD (gastroesophageal reflux disease)   . Anxiety   . CHF  (congestive heart failure)   . COPD (chronic obstructive pulmonary disease)   . Sleep apnea     severe OSA by 09/08/06 sleep study ([facility])  . Depression   . Morbid obesity    Assessment: 53 yo lady with cellulitis, large gluteal abscess. She did not have much improvement with Bactrim outpt. Admit with Acute Respiratory Failure / Perirectal abscess & Sepsis.  PMH: CHF, DM, obesity, COPD, OSA  ID: L gluteal absces. I&D on 2/5. Tmax 100.4 improved. WBC 29.9>18.9>15.8.  Cipro 2/8>> vanc 2/5>>2/8 Zosyn 2/5>>2/8  2/5: Abscess cx: Ecoli pan sensitive 2/5 BC x 2: pending 2/5: MSRA PCR: negative  CV: CHF, PVD, HTN. 120/52, HR 95, EF 55-60%  Endo: DM, gout. CBGs 234-304 only on SSI (home Janumet and Levemir 48uts/day). Left MD sticky note 2/7, but only added 10 units of Lantus?  GI/Nutr: Morbid obesity, h/o gastric bypass, GERD, Start PePUp protocol. PPI per tube  Neuro: Chronic pain, Acute encephalopathy, depression/anxiety on Fentanyl drip  Renal: AKI from sepsis. Scr 6.37 on bicarb drip. UOP negligible. Start CVVHD 2/8  Pulm: COPD, OSA, now VDRF on Fentanyl, FiO2 40%  Heme/Onc: chronic anemia with Hgb 12.3>9.7 today  Other: all home meds on hold.  Best practices: now SCDs, tube PPI  Goal of Therapy:  Treatment of Ecoli in wound  Plan:  Cipro 400mg  IV q12 hrs while on CVVHD  Vear Staton S. Alford Highland, PharmD, BCPS Clinical Staff Pharmacist Pager [phone]  Eilene Ghazi Stillinger 03/08/2013,11:25 AM

## 2013-03-09 ENCOUNTER — Inpatient Hospital Stay (HOSPITAL_COMMUNITY): Payer: Medicare Other

## 2013-03-09 ENCOUNTER — Other Ambulatory Visit (HOSPITAL_COMMUNITY): Payer: Medicare Other

## 2013-03-09 ENCOUNTER — Encounter: Payer: Medicare Other | Admitting: Surgery

## 2013-03-09 ENCOUNTER — Encounter (HOSPITAL_COMMUNITY): Payer: Medicare Other

## 2013-03-09 ENCOUNTER — Encounter (HOSPITAL_COMMUNITY): Payer: Self-pay | Admitting: General Surgery

## 2013-03-09 LAB — POCT I-STAT 3, ART BLOOD GAS (G3+)
Acid-Base Excess: 1 mmol/L (ref 0.0–2.0)
Acid-base deficit: 8 mmol/L — ABNORMAL HIGH (ref 0.0–2.0)
Bicarbonate: 18.9 mEq/L — ABNORMAL LOW (ref 20.0–24.0)
Bicarbonate: 28.2 mEq/L — ABNORMAL HIGH (ref 20.0–24.0)
O2 SAT: 92 %
O2 Saturation: 99 %
PH ART: 7.249 — AB (ref 7.350–7.450)
PO2 ART: 62 mmHg — AB (ref 80.0–100.0)
Patient temperature: 99.4
Patient temperature: 94.9
TCO2: 20 mmol/L (ref 0–100)
TCO2: 30 mmol/L (ref 0–100)
pCO2 arterial: 43.4 mmHg (ref 35.0–45.0)
pCO2 arterial: 53.4 mmHg — ABNORMAL HIGH (ref 35.0–45.0)
pH, Arterial: 7.32 — ABNORMAL LOW (ref 7.350–7.450)
pO2, Arterial: 163 mmHg — ABNORMAL HIGH (ref 80.0–100.0)

## 2013-03-09 LAB — POCT ACTIVATED CLOTTING TIME
ACTIVATED CLOTTING TIME: 160 s
ACTIVATED CLOTTING TIME: 166 s
ACTIVATED CLOTTING TIME: 177 s
ACTIVATED CLOTTING TIME: 166 s
ACTIVATED CLOTTING TIME: 155 s
ACTIVATED CLOTTING TIME: 166 s
ACTIVATED CLOTTING TIME: 171 s
ACTIVATED CLOTTING TIME: 182 s
ACTIVATED CLOTTING TIME: 177 s
Activated Clotting Time: 166 seconds
Activated Clotting Time: 177 seconds
Activated Clotting Time: 177 seconds
Activated Clotting Time: 155 seconds
Activated Clotting Time: 160 seconds
Activated Clotting Time: 171 seconds
Activated Clotting Time: 182 seconds
Activated Clotting Time: 188 seconds

## 2013-03-09 LAB — CBC
HCT: 30.7 % — ABNORMAL LOW (ref 36.0–46.0)
Hemoglobin: 10.4 g/dL — ABNORMAL LOW (ref 12.0–15.0)
MCH: 32.7 pg (ref 26.0–34.0)
MCHC: 33.9 g/dL (ref 30.0–36.0)
MCV: 96.5 fL (ref 78.0–100.0)
PLATELETS: 390 10*3/uL (ref 150–400)
RBC: 3.18 MIL/uL — ABNORMAL LOW (ref 3.87–5.11)
RDW: 13.7 % (ref 11.5–15.5)
WBC: 15.4 10*3/uL — AB (ref 4.0–10.5)

## 2013-03-09 LAB — RENAL FUNCTION PANEL
Albumin: 1.9 g/dL — ABNORMAL LOW (ref 3.5–5.2)
BUN: 33 mg/dL — ABNORMAL HIGH (ref 6–23)
CALCIUM: 7.7 mg/dL — AB (ref 8.4–10.5)
CHLORIDE: 100 meq/L (ref 96–112)
CO2: 26 meq/L (ref 19–32)
CREATININE: 2.61 mg/dL — AB (ref 0.50–1.10)
GFR calc non Af Amer: 20 mL/min — ABNORMAL LOW (ref >90)
GFR, EST AFRICAN AMERICAN: 23 mL/min — AB (ref >90)
GLUCOSE: 189 mg/dL — AB (ref 70–99)
Phosphorus: 3.6 mg/dL (ref 2.3–4.6)
Potassium: 4.2 mEq/L (ref 3.7–5.3)
SODIUM: 140 meq/L (ref 137–147)

## 2013-03-09 LAB — GLUCOSE, CAPILLARY
Glucose-Capillary: 149 mg/dL — ABNORMAL HIGH (ref 70–99)
Glucose-Capillary: 179 mg/dL — ABNORMAL HIGH (ref 70–99)
Glucose-Capillary: 182 mg/dL — ABNORMAL HIGH (ref 70–99)
Glucose-Capillary: 192 mg/dL — ABNORMAL HIGH (ref 70–99)
Glucose-Capillary: 193 mg/dL — ABNORMAL HIGH (ref 70–99)
Glucose-Capillary: 236 mg/dL — ABNORMAL HIGH (ref 70–99)

## 2013-03-09 LAB — MAGNESIUM
MAGNESIUM: 2.4 mg/dL (ref 1.5–2.5)
Magnesium: 2.2 mg/dL (ref 1.5–2.5)

## 2013-03-09 LAB — APTT: aPTT: 51 seconds — ABNORMAL HIGH (ref 24–37)

## 2013-03-09 LAB — BASIC METABOLIC PANEL
BUN: 46 mg/dL — ABNORMAL HIGH (ref 6–23)
CHLORIDE: 102 meq/L (ref 96–112)
CO2: 24 mEq/L (ref 19–32)
Calcium: 7.5 mg/dL — ABNORMAL LOW (ref 8.4–10.5)
Creatinine, Ser: 3.76 mg/dL — ABNORMAL HIGH (ref 0.50–1.10)
GFR calc non Af Amer: 13 mL/min — ABNORMAL LOW (ref >90)
GFR, EST AFRICAN AMERICAN: 15 mL/min — AB (ref >90)
Glucose, Bld: 214 mg/dL — ABNORMAL HIGH (ref 70–99)
POTASSIUM: 4 meq/L (ref 3.7–5.3)
SODIUM: 143 meq/L (ref 137–147)

## 2013-03-09 LAB — PHOSPHORUS
PHOSPHORUS: 3.4 mg/dL (ref 2.3–4.6)
Phosphorus: 4 mg/dL (ref 2.3–4.6)

## 2013-03-09 MED ORDER — PRO-STAT SUGAR FREE PO LIQD: 30.0000 mL | Freq: Every day | ORAL | Status: DC

## 2013-03-09 MED ORDER — PRO-STAT SUGAR FREE PO LIQD
30.0000 mL | Freq: Every day | ORAL | Status: DC
  Administered 2013-03-10 – 2013-03-13 (×4): 30 mL
  Filled 2013-03-09 (×4): qty 30

## 2013-03-09 MED ORDER — VITAL HIGH PROTEIN PO LIQD
1000.0000 mL | ORAL | Status: DC
  Filled 2013-03-09: qty 1000

## 2013-03-09 MED ORDER — VITAL HIGH PROTEIN PO LIQD
1000.0000 mL | ORAL | Status: DC
  Filled 2013-03-09 (×2): qty 1000

## 2013-03-09 MED ORDER — PRO-STAT SUGAR FREE PO LIQD
30.0000 mL | Freq: Two times a day (BID) | ORAL | Status: DC
  Filled 2013-03-09 (×2): qty 30

## 2013-03-09 MED ORDER — VITAL HIGH PROTEIN PO LIQD
1000.0000 mL | ORAL | Status: DC
  Administered 2013-03-09 – 2013-03-12 (×5): 1000 mL
  Filled 2013-03-09 (×6): qty 1000

## 2013-03-09 MED ORDER — DARBEPOETIN ALFA-POLYSORBATE 100 MCG/0.5ML IJ SOLN
100.0000 ug | INTRAMUSCULAR | Status: DC
  Administered 2013-03-09: 100 ug via INTRAVENOUS
  Filled 2013-03-09: qty 0.5

## 2013-03-09 MED ORDER — SODIUM CHLORIDE 0.9 % IV SOLN
INTRAVENOUS | Status: DC
  Administered 2013-03-09: 13:00:00 via INTRAVENOUS

## 2013-03-09 NOTE — Progress Notes (Signed)
[place] [facility]    Name: Stephanie Peck MRN: [id number] DOB: May 02, 1960     ADMISSION DATE: 03/05/2013  CONSULTATION DATE: 03/05/13  REFERRING MD : [facility]-->PCCM (2/5)   CHIEF COMPLAINT: Acute Respiratory Failure / Perirectal abscess & Sepsis   BRIEF PATIENT DESCRIPTION: [name] , with PMH of DM, GERD, Anxiety, COPD, Severe OSA, COPD, Morbid Obesity who presented to [facility] ER on 2/5 with a 4 day history of worsening left buttock abscess. CT of pelvis demonstrated multiple fluid dense areas within the subq fat of left buttock, stranding in the fat extending to anus compatible with cellulitis, and focal hemorrhage in left inguinal region, adjacent soft tissue mass.   SIGNIFICANT EVENTS / STUDIES:  2/5 - Admit with 4 day hx of worsening abscess on L buttock, fevers, chills. To OR per [facility]. Returned to ICU on vent.  2/5 - CT Pelvis: multiple fluid dense areas within the subq fat of left buttock, stranding in the fat extending to anus compatible with cellulitis, and focal hemorrhage in left inguinal region, adjacent soft tissue mass (?lymph node vs additional small hemorrhage). 2/6 2D echo>>> EF55-60%,  Mild AS, trivial pericardial effusion,   LINES / TUBES:  OETT 2/5>>> Rt IJ CVL 2/5>>>  CULTURES:  BCx2 2/5>>> NGTD Wound Culture 2/5>>> EColi  Abscess culture (OR) 2/5>>>GNR>>>EColi  Flu 2/5>>> neg  ANTIBIOTICS:  Vanco 2/5>>> 2/7 Zosyn 2/5>>> 2/8 Cipro 2/8>>>  SUBJECTIVE:   improving renal failure on CRT, UOP 700 .  No plans for return to OR at this time.   VITAL SIGNS: Temp:  [96 F (35.6 C)-99.5 F (37.5 C)] 97.3 F (36.3 C) (02/09 0400) Pulse Rate:  [59-97] 65 (02/09 0700) Resp:  [13-32] 30 (02/09 0700) BP: (91-162)/(40-73) 119/56 mmHg (02/09 0700) SpO2:  [86 %-100 %] 99 % (02/09 0700) Arterial Line BP: (107-179)/(46-89) 145/67 mmHg (02/09 0700) FiO2 (%):  [40 %-50 %] 50 % (02/09 0600) Weight:  [280 lb 13.9 oz (127.4 kg)] 280 lb 13.9 oz (127.4 kg) (02/09 0400) HEMODYNAMICS: CVP:  [11  mmHg-14 mmHg] 13 mmHg VENTILATOR SETTINGS: Vent Mode:  [-] PCV FiO2 (%):  [40 %-50 %] 50 % Set Rate:  [30 bmp] 30 bmp PEEP:  [5 cmH20] 5 cmH20 Plateau Pressure:  [16 cmH20-26 cmH20] 16 cmH20 INTAKE / OUTPUT: Intake/Output     02/08 0701 - 02/09 0700 02/09 0701 - 02/10 0700   I.V. (mL/kg) 2039 (16)    NG/GT     IV Piggyback 400    Total Intake(mL/kg) 2439 (19.1)    Urine (mL/kg/hr) 700 (0.2)    Other 1951 (0.6)    Total Output 2651     Net -212           PHYSICAL EXAMINATION: General: Morbidly obese female in NAD on vent  Neuro: sedated on vent, RASS 0, opens eyes to voice, makes eye contact and follows commands. HEENT: OETT, short / thick neck  Cardiovascular: s1s2 rrr, no m/r/g, distant tones  Lungs: resp's even/non-labored on vent, very diminished bases   Abdomen: Obese, soft, bs active  Skin: Warm/dry, L buttock dressing c/d/i Ext: warm, well perfused, +1 pitting edema bilateral ext.  LABS:  CBC  Recent Labs Lab 03/07/13 0825 03/08/13 0500 03/09/13 0430  WBC 18.9* 15.8* 15.4*  HGB 10.7* 9.7* 10.4*  HCT 31.9* 29.0* 30.7*  PLT 361 375 390   Coag's  Recent Labs Lab 03/06/13 1052 03/09/13 0430  APTT 39* 51*  INR 1.28  --    BMET  Recent  Labs Lab 03/07/13 1600 03/08/13 0500 03/09/13 0430  NA 136* 137 143  K 3.9 3.6* 4.0  CL 99 97 102  CO2 20 21 24   BUN 58* 69* 46*  CREATININE 5.51* 6.37* 3.76*  GLUCOSE 307* 265* 214*   Electrolytes  Recent Labs Lab 03/07/13 1600  03/08/13 0500 03/08/13 1118 03/08/13 2255 03/09/13 0430  CALCIUM 6.9*  --  6.9*  --   --  7.5*  MG  --   < >  --  2.1 2.3 2.2  PHOS 3.4  < > 3.4 4.0 3.7 3.4  < > = values in this interval not displayed. Sepsis Markers  Recent Labs Lab 03/05/13 1235 03/05/13 2248  LATICACIDVEN 1.6 1.3   ABG  Recent Labs Lab 03/06/13 2120 03/07/13 1216 03/08/13 0326  PHART 7.185* 7.328* 7.392  PCO2ART 46.8* 38.2 37.5  PO2ART 142.0* 82.0 74.4*   Liver Enzymes  Recent Labs Lab  03/05/13 1235 03/07/13 1600  AST 37  --   ALT 21  --   ALKPHOS 122*  --   BILITOT 0.3  --   ALBUMIN 2.4* 1.9*   Cardiac Enzymes  Recent Labs Lab 03/05/13 1455 03/05/13 2247 03/06/13 0433  TROPONINI <0.30 <0.30 <0.30  PROBNP 443.4*  --   --    Glucose  Recent Labs Lab 03/08/13 0711 03/08/13 1119 03/08/13 1507 03/08/13 1939 03/08/13 2357 03/09/13 0359  GLUCAP 234* 265* 253* 190* 192* 193*   Urinalysis    Component Value Date/Time   COLORURINE AMBER* 03/07/2013 1538   APPEARANCEUR TURBID* 03/07/2013 1538   LABSPEC 1.029 03/07/2013 1538   PHURINE 5.0 03/07/2013 1538   GLUCOSEU 100* 03/07/2013 1538   HGBUR LARGE* 03/07/2013 1538   BILIRUBINUR MODERATE* 03/07/2013 1538   KETONESUR 15* 03/07/2013 1538   PROTEINUR 30* 03/07/2013 1538   UROBILINOGEN 1.0 03/07/2013 1538   NITRITE NEGATIVE 03/07/2013 1538   LEUKOCYTESUR SMALL* 03/07/2013 1538   Imaging US Renal  03/07/2013   CLINICAL DATA:  Renal insufficiency  EXAM: RENAL ULTRASOUND  COMPARISON:  CT abdomen and pelvis March 31, 2012  FINDINGS: Right kidney measures 12.6 cm in length. Left kidney measures 13.6 cm in length. There is no demonstrable renal mass, pelvicaliectasis, or perinephric fluid collection on either side. There is no apparent renal calculus or ureterectasis on either side. Renal cortical thickness and echogenicity appear normal bilaterally.  Urinary bladder is decompressed with a Foley catheter in cannot be assessed.  IMPRESSION: No renal lesions appreciable.   Electronically Signed   By: Lowella Grip M.D.   On: 03/07/2013 16:11   Dg Chest Port 1 View  03/09/2013   CLINICAL DATA:  Respiratory failure.  EXAM: PORTABLE CHEST - 1 VIEW  COMPARISON:  03/08/2013  FINDINGS: Bilateral airspace and interstitial densities are mildly improved.  No pneumothorax.  Support apparatus is stable and well positioned.  IMPRESSION: Mild improvement in pulmonary edema.   Electronically Signed   By: Lajean Manes M.D.   On: 03/09/2013 07:16   Dg  Chest Portable 1 View  03/08/2013   CLINICAL DATA:  Central line placement  EXAM: PORTABLE CHEST - 1 VIEW  COMPARISON:  Same day  FINDINGS: Right internal jugular central line has its tip in the SVC at the azygos level. Endotracheal tube has its tip 3.5 cm above the carina. New left internal jugular central line has its tip at the innominate SVC junction. No pneumothorax. Pulmonary edema is worsened in general.  IMPRESSION: New left internal jugular catheter has its tip  at the innominate SVC junction. No pneumothorax. Worsened pulmonary edema.   Electronically Signed   By: Nelson Chimes M.D.   On: 03/08/2013 15:25   Dg Chest Port 1 View  03/08/2013   CLINICAL DATA:  Respiratory failure.  EXAM: PORTABLE CHEST - 1 VIEW  COMPARISON:  03/06/2013.  FINDINGS: Stable right jugular catheter. Endotracheal tube in satisfactory position. Interval nasogastric tube extending into the stomach. Stable enlarged cardiac silhouette. Increased bibasilar atelectasis. Mild increase in prominence of the interstitial markings with scattered alveolar opacities.  IMPRESSION: 1. Increased bibasilar atelectasis. 2. Mildly increased pulmonary edema.   Electronically Signed   By: Enrique Sack M.D.   On: 03/08/2013 07:32   Dg Abd Portable 1v  03/08/2013   CLINICAL DATA:  Abdominal distention  EXAM: PORTABLE ABDOMEN - 1 VIEW  COMPARISON:  03/06/2013  FINDINGS: Scattered large and small bowel gas is noted. A nasogastric catheter is coiled within the stomach. No obstructive changes are seen. Mild degenerative change the lumbar spine is noted.  IMPRESSION: No acute abnormality seen.   Electronically Signed   By: Inez Catalina M.D.   On: 03/08/2013 10:46   ASSESSMENT / PLAN:  PULMONARY  A:  Acute Respiratory Failure  COPD  OSA  P:  - Vent settings restablished.  - CXR: no acute changes. - Rpt abg post change. - Minimize sedation as able. - Will need CPAP QHS post extubation. - Family deciding on trach/peg.  CARDIOVASCULAR  A:   CHF Hypotension - improved  Shock, etiology, likely septic  P:  - Monitor CVP (13); goal 12 - Hold home cozaar  - Levophed , current MAP 65-72 - Hold stress steroids for now - cortisol ok.  RENAL  A:  Acute Renal failure - ATN likely - in setting of sepsis (CKD stage 3) Hyperkalemia improved Metabolic acidosis - improving on CRT  UOP- improving on CRT  P:  - Negative balance of 50/h. - Hold nephrotoxic agents. - Cont HCO3 gtt  - CRT started 2/8. - Renal consulting  GASTROINTESTINAL  A:  GERD  Hx Gastric Bypass  P:  - Protonix while intubated, not on PPI as outpt. - RestartTF.  HEMATOLOGIC  A:  Chronic Anemia  coags ok  P:  - Hold PO iron. - SCD's / heparin for DVT.  INFECTIOUS  A:  Left Buttock Abscess / Perirectal Cellulitis- s/p I&D 2/5.  Ecoli in both cultures- both pan sens  P:  - Abx & cultures as above  - No immediate plans to return to OR at this time.  - D/c vanc, zosyn and started cipro 2/8, continue   ENDOCRINE  A:  Diabetes  Gout  P:  - SSI with resistant scale  - Added lantus 10 mg 2/8 - Hold januvia, janumet, indocin   NEUROLOGIC  A:  Pain  Acute Encephalopathy  Depression / Anxiety  P:  - Fentanyl gtt for pain (120 mcg)  - PRN versed for sedation needs  - Hold cymbalta, wellbutrin, neurontin, topamax   Kuneff, Renee DO PGY-2  Summary: Vent change to avoid dysynchrony, minimize sedation, LCB per niece (was DNR prior, now LCB with no CPR and no cardioversion), family discussing trach/peg/long term dialysis for prognostic purposes.  I have personally obtained a history, examined the patient, evaluated laboratory and imaging results, formulated the assessment and plan and placed orders.  CRITICAL CARE: The patient is critically ill with multiple organ systems failure and requires high complexity decision making for assessment and support, frequent evaluation  and titration of therapies, application of advanced monitoring technologies  and extensive interpretation of multiple databases. Critical Care Time devoted to patient care services described in this note is 35 minutes.   Rush Farmer, M.D. [facility] Pulmonary/Critical Care Medicine. Pager: [phone]. After hours pager: [phone].

## 2013-03-09 NOTE — Progress Notes (Signed)
PT HAVING PROCEDURE DONE AT BEDSIDE.  Rt ASSESSED AS MUCH AS POSSIBLE DURING PROCEDURE.  NO TUBE REPOSITION OR SUCTION AT THIS TIME.

## 2013-03-09 NOTE — Progress Notes (Signed)
Admit: 03/05/2013 LOS: 4  45F with AKI in setting of CKD3 (BL 1.2-1.8) 2/2 ATN in setting of hypoxic/hypercapneic VDRF, necrotizing L buttock skin infection, and septic shock.    Subjective:  Off pressors, soft BPs- currently agitated on vent- needing more sedation- CRRT running relatively well. UOP 30-45 per hour Still on HCO3 gtt No plans for OR today   02/08 0701 - 02/09 0700 In: 2439 [I.V.:2039; IV Piggyback:400] Out: 2651 [Urine:700]  Filed Weights   03/07/13 0500 03/08/13 0500 03/09/13 0400  Weight: 128.5 kg (283 lb 4.7 oz) 132.5 kg (292 lb 1.8 oz) 127.4 kg (280 lb 13.9 oz)    Current meds: reviewed  Current Labs: reviewed    Physical Exam:  Blood pressure 119/56, pulse 72, temperature 97.3 F (36.3 C), temperature source Oral, resp. rate 32, height 5\' 7"  (1.702 m), weight 127.4 kg (280 lb 13.9 oz), SpO2 100.00%. GEN: obese, intubated, sedated  ENT: hirsuit, ETT in place  EYES: eyes closed  CV: RRR, nl s1s2  PULM: coarse bs b/l  ABD: s/nt/nd, obese  SKIN: no rashes/lesions  GU: foley catheter  PSYCH: unable to assess  EXT:1+ LEE   Assessment  45F with AKI in setting of CKD3 (BL 1.2-1.8) 2/2 ATN in setting of hypoxic/hypercapneic VDRF, necrotizing L buttock skin infection, and septic shock.  1. AKI 2/2 ATN, anuric 2/2 sepsis + ?TMP/SMX, ARB, NSAID 2. Inc AG Metabolic Acidosis + Resp Acidosis (failure to compensate) 3. CKD3 4. Septic Shock 2/2 polymicrobial / E.coli necrotizing skin infection on Vanc/Zosyn 5. Hypoxic and Hypercapneic VDRF 6. Morbid Obesity 7. Uncontrolled DM2, admit A1c 11.3% PLAN  1. Continue CRRT today with some attempted UF given no pressors and grossly overloaded  2. Will follow closely 3. Electrolytes are stable  4. No NSAIDS, judicious IV contrast 5. ABX per CCM- WBC coming down 6. Anemia- multifactorial- add aranesp- transfuse as needed   Jibri Schriefer A  03/09/2013, 8:19 AM   Recent Labs Lab 03/07/13 1600  03/08/13 0500  03/08/13 1118 03/08/13 2255 03/09/13 0430  NA 136*  --  137  --   --  143  K 3.9  --  3.6*  --   --  4.0  CL 99  --  97  --   --  102  CO2 20  --  21  --   --  24  GLUCOSE 307*  --  265*  --   --  214*  BUN 58*  --  69*  --   --  46*  CREATININE 5.51*  --  6.37*  --   --  3.76*  CALCIUM 6.9*  --  6.9*  --   --  7.5*  PHOS 3.4  < > 3.4 4.0 3.7 3.4  < > = values in this interval not displayed.  Recent Labs Lab 03/05/13 1235  03/07/13 0825 03/08/13 0500 03/09/13 0430  WBC 25.7*  < > 18.9* 15.8* 15.4*  NEUTROABS 22.6*  --   --   --   --   HGB 13.0  < > 10.7* 9.7* 10.4*  HCT 36.4  < > 31.9* 29.0* 30.7*  MCV 93.8  < > 97.3 96.3 96.5  PLT 384  < > 361 375 390  < > = values in this interval not displayed.

## 2013-03-09 NOTE — Progress Notes (Addendum)
Inpatient Diabetes Program Recommendations  AACE/ADA: New Consensus Statement on Inpatient Glycemic Control (2013)  Target Ranges:  Prepandial:   less than 140 mg/dL      Peak postprandial:   less than 180 mg/dL (1-2 hours)      Critically ill patients:  140 - 180 mg/dL   Reason for Visit: Results for KOURTNEE, LAHEY (MRN [id number]) as of 03/09/2013 13:40  Ref. Range 03/08/2013 19:39 03/08/2013 23:57 03/09/2013 03:59 03/09/2013 07:40 03/09/2013 11:57  Glucose-Capillary Latest Range: 70-99 mg/dL 190 (H) 192 (H) 193 (H) 182 (H) 236 (H)  Results for NECHUMA, BOVEN (MRN [id number]) as of 03/09/2013 13:47  Ref. Range 03/06/2013 04:10  Hemoglobin A1C Latest Range: <5.7 % 11.3 (H)    Diabetes history: Type 2 diabetes Outpatient Diabetes medications:  Levemir 48 units daily, Januvia 50 mg daily Current orders for Inpatient glycemic control: Lantus 10 units q HS, Novolog resistant q 4 hours  Please consider increasing Lantus to 20 units daily.  Adah Perl, RN, BC-ADM Inpatient Diabetes Coordinator Pager [phone]

## 2013-03-09 NOTE — Progress Notes (Signed)
NO SUCTION AT THIS TIME, PT UNSTABLE WITH NO MEDS FOR SEDATION.  RN REQUESTED MINIMAL STIMULATION.

## 2013-03-09 NOTE — ED Provider Notes (Addendum)
Medical screening examination/treatment/procedure(s) were conducted as a shared visit with non-physician practitioner(s) and myself.  I personally evaluated the patient during the encounter.    Stephanie Peck is a 53 y.o. female hx of COPD, obesity here with R buttuck cellulitis. Noticed infection for several days, already on bactrim. Has subjective chills. On exam, large area of cellulitis with some fluctuance. No subcutaneous gas. CT showed cellulitis and possible abscess. She appears septic and vanc/zosyn given. Of note, she was hypotensive but responsive to fluids. Also appears to be in CHF and was hypoxic. We avoided diuresing the patient given hypotension and sepsis. Patient admitted for sepsis from cellulitis and abscess and also CHF.     Wandra Arthurs, MD 03/09/13 Stephanie Darrnell Mangiaracina, MD 03/09/13 [phone]

## 2013-03-09 NOTE — Progress Notes (Signed)
4 Days Post-Op  Subjective: On vent and CRRT  Objective: Vital signs in last 24 hours: Temp:  [96 F (35.6 C)-99.5 F (37.5 C)] 97.3 F (36.3 C) (02/09 0400) Pulse Rate:  [59-97] 72 (02/09 0746) Resp:  [13-32] 32 (02/09 0746) BP: (91-162)/(40-73) 119/56 mmHg (02/09 0746) SpO2:  [86 %-100 %] 100 % (02/09 0746) Arterial Line BP: (107-179)/(46-89) 145/67 mmHg (02/09 0700) FiO2 (%):  [40 %-50 %] 40 % (02/09 0746) Weight:  [280 lb 13.9 oz (127.4 kg)] 280 lb 13.9 oz (127.4 kg) (02/09 0400) Last BM Date:  (pts)  Intake/Output from previous day: 02/08 0701 - 02/09 0700 In: 2439 [I.V.:2039; IV Piggyback:400] Out: 2651 [Urine:700] Intake/Output this shift:    sedated on vent L buttock wound is cleaner, no necrotic tissue, minimal drainage. WTD dressing changed. Lab Results:   Recent Labs  03/08/13 0500 03/09/13 0430  WBC 15.8* 15.4*  HGB 9.7* 10.4*  HCT 29.0* 30.7*  PLT 375 390   BMET  Recent Labs  03/08/13 0500 03/09/13 0430  NA 137 143  K 3.6* 4.0  CL 97 102  CO2 21 24  GLUCOSE 265* 214*  BUN 69* 46*  CREATININE 6.37* 3.76*  CALCIUM 6.9* 7.5*   PT/INR  Recent Labs  03/06/13 1052  LABPROT 15.7*  INR 1.28   ABG  Recent Labs  03/07/13 1216 03/08/13 0326  PHART 7.328* 7.392  HCO3 19.8* 22.0   Anti-infectives: Anti-infectives   Start     Dose/Rate Route Frequency Ordered Stop   03/09/13 1400  vancomycin (VANCOCIN) 1,750 mg in sodium chloride 0.9 % 500 mL IVPB  Status:  Discontinued     1,750 mg 250 mL/hr over 120 Minutes Intravenous Every 48 hours 03/07/13 1050 03/08/13 1050   03/08/13 1200  ciprofloxacin (CIPRO) IVPB 400 mg     400 mg 200 mL/hr over 60 Minutes Intravenous Every 12 hours 03/08/13 1133     03/06/13 1400  vancomycin (VANCOCIN) 1,750 mg in sodium chloride 0.9 % 500 mL IVPB     1,750 mg 250 mL/hr over 120 Minutes Intravenous Every 24 hours 03/05/13 1650 03/07/13 1511   03/05/13 2030  piperacillin-tazobactam (ZOSYN) IVPB 3.375 g   Status:  Discontinued     3.375 g 12.5 mL/hr over 240 Minutes Intravenous 3 times per day 03/05/13 1637 03/08/13 1050   03/05/13 1700  vancomycin (VANCOCIN) IVPB 1000 mg/200 mL premix     1,000 mg 200 mL/hr over 60 Minutes Intravenous NOW 03/05/13 1650 03/05/13 2027   03/05/13 1200  vancomycin (VANCOCIN) IVPB 1000 mg/200 mL premix     1,000 mg 200 mL/hr over 60 Minutes Intravenous  Once 03/05/13 1148 03/05/13 1453   03/05/13 1200  piperacillin-tazobactam (ZOSYN) IVPB 3.375 g     3.375 g 100 mL/hr over 30 Minutes Intravenous  Once 03/05/13 1148 03/05/13 1348      Assessment/Plan: S/P I&D L perirectal/buttock abscess - continue wet to dry dressing changes. No need for further operative debridement VDRF - per CCM AKI on CRRT - per renal ID - Cipro for e coli  LOS: 4 days    Khloee Garza E 03/09/2013

## 2013-03-09 NOTE — Progress Notes (Signed)
Oral airway inserted to keep patient from clamping down on tube. Small amounts of blood were obtained after insertion.  Myrtie Neither, RRT, RCP

## 2013-03-09 NOTE — Progress Notes (Signed)
NUTRITION FOLLOW UP  Intervention:    Utilize 67M PEPuP Protocol: initiate TF via OGT with Vital High Protein at 25 ml/h and Prostat 30 ml once daily on day 1; on day 2, increase to goal rate of 65 ml/h (1560 ml per day) to provide 1660 kcals (72% of estimated needs), 152 gm protein, 1304 ml free water daily.  Nutrition Dx:   Inadequate oral intake related to inability to eat as evidenced by NPO status. Ongoing.  Goal:   Enteral nutrition to provide 60-70% of estimated calorie needs (22-25 kcals/kg ideal body weight) and 100% of estimated protein needs, based on ASPEN guidelines for permissive underfeeding in critically ill obese individuals. Unmet.  Monitor:   TF tolerance/adequacy, weight trend, labs, vent status.  Assessment:   [name] with PMH of DM, GERD, Anxiety, COPD, Severe OSA, COPD, Morbid Obesity who presented to [facility] ER on 2/5 with a 4 day history of worsening left buttock abscess. CT of pelvis demonstrated multiple fluid dense areas within the subq fat of left buttock, stranding in the fat extending to anus compatible with cellulitis, and focal hemorrhage in left inguinal region, adjacent soft tissue mass.  S/P I&D of perirectal abscess on 2/5. Patient started on CRRT on 2/8. TF was held for possible OR procedure, which was cancelled yesterday. TF was not resumed yesterday; to be resumed today.  Patient is currently intubated on ventilator support.  MV: 10.1 L/min Temp (24hrs), Avg:97.1 F (36.2 C), Min:94.9 F (34.9 C), Max:99.5 F (37.5 C)   Height: Ht Readings from Last 1 Encounters:  03/05/13 5\' 7"  (1.702 m)    Weight Status:   Wt Readings from Last 1 Encounters:  03/09/13 280 lb 13.9 oz (127.4 kg)  03/06/13  279 lb 1.6 oz (126.6 kg)   Re-estimated needs:  Kcal: 2300 Protein: 153 gm Fluid: 2.2-2.4 L  Skin: 2 necrotic lesions on left buttocks, S/P I&D  Diet Order: NPO   Intake/Output Summary (Last 24 hours) at 03/09/13 1458 Last data filed at 03/09/13  1400  Gross per 24 hour  Intake 2136.5 ml  Output   3520 ml  Net -1383.5 ml    Last BM: PTA   Labs:   Recent Labs Lab 03/07/13 1600  03/08/13 0500  03/08/13 2255 03/09/13 0430 03/09/13 1211  NA 136*  --  137  --   --  143  --   K 3.9  --  3.6*  --   --  4.0  --   CL 99  --  97  --   --  102  --   CO2 20  --  21  --   --  24  --   BUN 58*  --  69*  --   --  46*  --   CREATININE 5.51*  --  6.37*  --   --  3.76*  --   CALCIUM 6.9*  --  6.9*  --   --  7.5*  --   MG  --   < >  --   < > 2.3 2.2 2.4  PHOS 3.4  < > 3.4  < > 3.7 3.4 4.0  GLUCOSE 307*  --  265*  --   --  214*  --   < > = values in this interval not displayed.  CBG (last 3)   Recent Labs  03/09/13 0359 03/09/13 0740 03/09/13 1157  GLUCAP 193* 182* 236*    Scheduled Meds: . antiseptic oral rinse  15 mL Mouth Rinse QID  . chlorhexidine  15 mL Mouth Rinse BID  . ciprofloxacin  400 mg Intravenous Q12H  . darbepoetin (ARANESP) injection - DIALYSIS  100 mcg Intravenous Q Mon-HD  . feeding supplement (PRO-STAT SUGAR FREE 64)  30 mL Per Tube BID  . feeding supplement (VITAL HIGH PROTEIN)  1,000 mL Per Tube Q24H  . insulin aspart  0-20 Units Subcutaneous Q4H  . insulin glargine  10 Units Subcutaneous QHS  . pantoprazole sodium  40 mg Per Tube QHS  . sodium chloride  3 mL Intravenous Q12H    Continuous Infusions: . sodium chloride 10 mL/hr at 03/09/13 1235  . fentaNYL infusion INTRAVENOUS 150 mcg/hr (03/09/13 1200)  . heparin 10,000 units/ 20 mL infusion syringe 2,200 Units/hr (03/09/13 1400)  . norepinephrine (LEVOPHED) Adult infusion Stopped (03/07/13 0758)  . dialysis replacement fluid (prismasate) 500 mL/hr at 03/09/13 1158  . dialysis replacement fluid (prismasate) 500 mL/hr at 03/09/13 1425  . dialysate (PRISMASATE) 2,000 mL/hr at 03/09/13 1157    Molli Barrows, RD, LDN, [place] Pager [phone] After Hours Pager [phone]

## 2013-03-10 ENCOUNTER — Inpatient Hospital Stay (HOSPITAL_COMMUNITY): Payer: Medicare Other

## 2013-03-10 LAB — POCT ACTIVATED CLOTTING TIME
ACTIVATED CLOTTING TIME: 216 s
ACTIVATED CLOTTING TIME: 204 s
ACTIVATED CLOTTING TIME: 204 s
ACTIVATED CLOTTING TIME: 182 s
ACTIVATED CLOTTING TIME: 193 s
Activated Clotting Time: 204 seconds
Activated Clotting Time: 188 seconds
Activated Clotting Time: 182 seconds
Activated Clotting Time: 193 seconds
Activated Clotting Time: 193 seconds
Activated Clotting Time: 188 seconds
Activated Clotting Time: 188 seconds
Activated Clotting Time: 188 seconds

## 2013-03-10 LAB — RENAL FUNCTION PANEL
ALBUMIN: 2.1 g/dL — AB (ref 3.5–5.2)
Albumin: 1.7 g/dL — ABNORMAL LOW (ref 3.5–5.2)
BUN: 27 mg/dL — AB (ref 6–23)
BUN: 26 mg/dL — ABNORMAL HIGH (ref 6–23)
CO2: 23 meq/L (ref 19–32)
CO2: 24 meq/L (ref 19–32)
CREATININE: 2.13 mg/dL — AB (ref 0.50–1.10)
CREATININE: 1.74 mg/dL — AB (ref 0.50–1.10)
Calcium: 8.2 mg/dL — ABNORMAL LOW (ref 8.4–10.5)
Calcium: 8.2 mg/dL — ABNORMAL LOW (ref 8.4–10.5)
Chloride: 102 mEq/L (ref 96–112)
Chloride: 102 mEq/L (ref 96–112)
GFR calc Af Amer: 29 mL/min — ABNORMAL LOW (ref >90)
GFR calc Af Amer: 37 mL/min — ABNORMAL LOW (ref >90)
GFR calc non Af Amer: 25 mL/min — ABNORMAL LOW (ref >90)
GFR calc non Af Amer: 32 mL/min — ABNORMAL LOW (ref >90)
GLUCOSE: 213 mg/dL — AB (ref 70–99)
GLUCOSE: 316 mg/dL — AB (ref 70–99)
Phosphorus: 3.5 mg/dL (ref 2.3–4.6)
Phosphorus: 3.9 mg/dL (ref 2.3–4.6)
Potassium: 4.3 mEq/L (ref 3.7–5.3)
Potassium: 4.7 mEq/L (ref 3.7–5.3)
Sodium: 141 mEq/L (ref 137–147)
Sodium: 138 mEq/L (ref 137–147)

## 2013-03-10 LAB — GLUCOSE, CAPILLARY
GLUCOSE-CAPILLARY: 272 mg/dL — AB (ref 70–99)
GLUCOSE-CAPILLARY: 232 mg/dL — AB (ref 70–99)
Glucose-Capillary: 160 mg/dL — ABNORMAL HIGH (ref 70–99)
Glucose-Capillary: 197 mg/dL — ABNORMAL HIGH (ref 70–99)
Glucose-Capillary: 223 mg/dL — ABNORMAL HIGH (ref 70–99)
Glucose-Capillary: 273 mg/dL — ABNORMAL HIGH (ref 70–99)

## 2013-03-10 LAB — CBC WITH DIFFERENTIAL/PLATELET
BASOS PCT: 1 % (ref 0–1)
Basophils Absolute: 0.2 10*3/uL — ABNORMAL HIGH (ref 0.0–0.1)
Eosinophils Absolute: 0.2 10*3/uL (ref 0.0–0.7)
Eosinophils Relative: 1 % (ref 0–5)
HCT: 34.6 % — ABNORMAL LOW (ref 36.0–46.0)
Hemoglobin: 11.1 g/dL — ABNORMAL LOW (ref 12.0–15.0)
Lymphocytes Relative: 10 % — ABNORMAL LOW (ref 12–46)
Lymphs Abs: 1.5 10*3/uL (ref 0.7–4.0)
MCH: 32.1 pg (ref 26.0–34.0)
MCHC: 32.1 g/dL (ref 30.0–36.0)
MCV: 100 fL (ref 78.0–100.0)
MONOS PCT: 5 % (ref 3–12)
Monocytes Absolute: 0.8 10*3/uL (ref 0.1–1.0)
NEUTROS PCT: 83 % — AB (ref 43–77)
Neutro Abs: 12.7 10*3/uL — ABNORMAL HIGH (ref 1.7–7.7)
PLATELETS: 401 10*3/uL — AB (ref 150–400)
RBC: 3.46 MIL/uL — AB (ref 3.87–5.11)
RDW: 13.4 % (ref 11.5–15.5)
WBC: 15.4 10*3/uL — ABNORMAL HIGH (ref 4.0–10.5)

## 2013-03-10 LAB — PHOSPHORUS
PHOSPHORUS: 3.9 mg/dL (ref 2.3–4.6)
Phosphorus: 3.4 mg/dL (ref 2.3–4.6)

## 2013-03-10 LAB — APTT: aPTT: 59 seconds — ABNORMAL HIGH (ref 24–37)

## 2013-03-10 LAB — MAGNESIUM
MAGNESIUM: 2.3 mg/dL (ref 1.5–2.5)
Magnesium: 2.4 mg/dL (ref 1.5–2.5)

## 2013-03-10 MED ORDER — INSULIN GLARGINE 100 UNIT/ML ~~LOC~~ SOLN
20.0000 [IU] | Freq: Every day | SUBCUTANEOUS | Status: DC
  Administered 2013-03-10: 20 [IU] via SUBCUTANEOUS
  Filled 2013-03-10: qty 0.2

## 2013-03-10 MED ORDER — CLONAZEPAM 0.1 MG/ML ORAL SUSPENSION: 2.0000 mg | Freq: Two times a day (BID) | ORAL | Status: DC

## 2013-03-10 MED ORDER — INSULIN GLARGINE 100 UNIT/ML ~~LOC~~ SOLN
13.0000 [IU] | Freq: Every day | SUBCUTANEOUS | Status: DC
  Filled 2013-03-10: qty 0.13

## 2013-03-10 MED ORDER — CLONAZEPAM 0.5 MG PO TABS
2.0000 mg | ORAL_TABLET | Freq: Two times a day (BID) | ORAL | Status: DC
  Administered 2013-03-10 – 2013-03-13 (×7): 2 mg via ORAL
  Filled 2013-03-10 (×8): qty 4

## 2013-03-10 MED ORDER — INSULIN GLARGINE 100 UNIT/ML ~~LOC~~ SOLN: 15.0000 [IU] | Freq: Every day | SUBCUTANEOUS | Status: DC

## 2013-03-10 NOTE — Progress Notes (Signed)
No suction at this time.  rn is suctioning pt.

## 2013-03-10 NOTE — Progress Notes (Signed)
Patient ID: Stephanie Peck, female   DOB: 04-10-1960, 53 y.o.   MRN: [id number] 5 Days Post-Op  Subjective: Pt intubated on CRRT.  Objective: Vital signs in last 24 hours: Temp:  [94.4 F (34.7 C)-97.5 F (36.4 C)] 97.5 F (36.4 C) (02/10 0353) Pulse Rate:  [65-96] 79 (02/10 0700) Resp:  [7-30] 7 (02/10 0700) BP: (91-156)/(45-81) 106/45 mmHg (02/10 0700) SpO2:  [88 %-99 %] 98 % (02/10 0700) Arterial Line BP: (124-176)/(55-75) 153/70 mmHg (02/09 1100) FiO2 (%):  [50 %] 50 % (02/10 0315) Weight:  [277 lb 12.5 oz (126 kg)] 277 lb 12.5 oz (126 kg) (02/10 0500) Last BM Date:  (pts)  Intake/Output from previous day: 02/09 0701 - 02/10 0700 In: 1720 [I.V.:697.5; NG/GT:592.5; IV Piggyback:400] Out: 3224 [Urine:462] Intake/Output this shift:    PE: Skin: wound is clean, no further erythema or infection noted.  Packing replaced  Lab Results:   Recent Labs  03/09/13 0430 03/10/13 0730  WBC 15.4* 15.4*  HGB 10.4* 11.1*  HCT 30.7* 34.6*  PLT 390 401*   BMET  Recent Labs  03/09/13 1539 03/10/13 0405  NA 140 141  K 4.2 4.3  CL 100 102  CO2 26 23  GLUCOSE 189* 213*  BUN 33* 27*  CREATININE 2.61* 2.13*  CALCIUM 7.7* 8.2*   PT/INR No results found for this basename: LABPROT, INR,  in the last 72 hours CMP     Component Value Date/Time   NA 141 03/10/2013 0405   K 4.3 03/10/2013 0405   CL 102 03/10/2013 0405   CO2 23 03/10/2013 0405   GLUCOSE 213* 03/10/2013 0405   BUN 27* 03/10/2013 0405   CREATININE 2.13* 03/10/2013 0405   CALCIUM 8.2* 03/10/2013 0405   PROT 7.2 03/05/2013 1235   ALBUMIN 2.1* 03/10/2013 0405   AST 37 03/05/2013 1235   ALT 21 03/05/2013 1235   ALKPHOS 122* 03/05/2013 1235   BILITOT 0.3 03/05/2013 1235   GFRNONAA 25* 03/10/2013 0405   GFRAA 29* 03/10/2013 0405   Lipase     Component Value Date/Time   LIPASE 12 02/04/2007 2219       Studies/Results: Dg Chest Port 1 View  03/09/2013   CLINICAL DATA:  Respiratory failure.  EXAM: PORTABLE CHEST - 1 VIEW   COMPARISON:  03/08/2013  FINDINGS: Bilateral airspace and interstitial densities are mildly improved.  No pneumothorax.  Support apparatus is stable and well positioned.  IMPRESSION: Mild improvement in pulmonary edema.   Electronically Signed   By: Lajean Manes M.D.   On: 03/09/2013 07:16   Dg Chest Portable 1 View  03/08/2013   CLINICAL DATA:  Central line placement  EXAM: PORTABLE CHEST - 1 VIEW  COMPARISON:  Same day  FINDINGS: Right internal jugular central line has its tip in the SVC at the azygos level. Endotracheal tube has its tip 3.5 cm above the carina. New left internal jugular central line has its tip at the innominate SVC junction. No pneumothorax. Pulmonary edema is worsened in general.  IMPRESSION: New left internal jugular catheter has its tip at the innominate SVC junction. No pneumothorax. Worsened pulmonary edema.   Electronically Signed   By: Nelson Chimes M.D.   On: 03/08/2013 15:25   Dg Abd Portable 1v  03/08/2013   CLINICAL DATA:  Abdominal distention  EXAM: PORTABLE ABDOMEN - 1 VIEW  COMPARISON:  03/06/2013  FINDINGS: Scattered large and small bowel gas is noted. A nasogastric catheter is coiled within the stomach. No obstructive changes are  seen. Mild degenerative change the lumbar spine is noted.  IMPRESSION: No acute abnormality seen.   Electronically Signed   By: Inez Catalina M.D.   On: 03/08/2013 10:46    Anti-infectives: Anti-infectives   Start     Dose/Rate Route Frequency Ordered Stop   03/09/13 1400  vancomycin (VANCOCIN) 1,750 mg in sodium chloride 0.9 % 500 mL IVPB  Status:  Discontinued     1,750 mg 250 mL/hr over 120 Minutes Intravenous Every 48 hours 03/07/13 1050 03/08/13 1050   03/08/13 1200  ciprofloxacin (CIPRO) IVPB 400 mg     400 mg 200 mL/hr over 60 Minutes Intravenous Every 12 hours 03/08/13 1133     03/06/13 1400  vancomycin (VANCOCIN) 1,750 mg in sodium chloride 0.9 % 500 mL IVPB     1,750 mg 250 mL/hr over 120 Minutes Intravenous Every 24 hours  03/05/13 1650 03/07/13 1511   03/05/13 2030  piperacillin-tazobactam (ZOSYN) IVPB 3.375 g  Status:  Discontinued     3.375 g 12.5 mL/hr over 240 Minutes Intravenous 3 times per day 03/05/13 1637 03/08/13 1050   03/05/13 1700  vancomycin (VANCOCIN) IVPB 1000 mg/200 mL premix     1,000 mg 200 mL/hr over 60 Minutes Intravenous NOW 03/05/13 1650 03/05/13 2027   03/05/13 1200  vancomycin (VANCOCIN) IVPB 1000 mg/200 mL premix     1,000 mg 200 mL/hr over 60 Minutes Intravenous  Once 03/05/13 1148 03/05/13 1453   03/05/13 1200  piperacillin-tazobactam (ZOSYN) IVPB 3.375 g     3.375 g 100 mL/hr over 30 Minutes Intravenous  Once 03/05/13 1148 03/05/13 1348       Assessment/Plan  1. POD 5, s/p I&D of left  Gluteal abscess Patient Active Problem List   Diagnosis Date Noted  . Acute renal failure 03/08/2013  . Sepsis 03/05/2013  . CHF, acute on chronic 03/05/2013  . DM type 2 (diabetes mellitus, type 2) 03/05/2013  . Gluteal abscess 03/05/2013  . Aneurysm of unspecified site 08/25/2012  . 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 Encounter for postoperative wound check 04/28/2012  . Pain in limb 03/31/2012  . Peripheral vascular disease, unspecified 02/29/2012  . Pain in the groin 02/29/2012  . Atherosclerosis of native arteries of the extremities with intermittent claudication 11/19/2011  . Hip pain, bilateral 10/10/2011  . Leg pain, bilateral 06/27/2011  . Low back pain 06/27/2011  . Bilateral knee pain 06/27/2011  . DIABETES MELLITUS, TYPE II 03/13/2007  . OBESITY 03/13/2007  . HYPERTENSION 03/13/2007  . UNSTABLE ANGINA 03/13/2007  . CONGESTIVE HEART FAILURE 03/13/2007  . DIASTOLIC DYSFUNCTION 62/70/3500  . GERD 03/13/2007  . SLEEP APNEA, MILD 03/13/2007  . HEADACHE, CHRONIC 03/13/2007   Plan: 1. Cont NS WD dressing changes to wound BID. 2. Cont abx therapy   LOS: 5 days    Beckey Polkowski E 03/10/2013, 8:06 AM Pager: [phone]

## 2013-03-10 NOTE — Procedures (Signed)
Central Venous hemodialysis Catheter Insertion Procedure Note Stephanie Peck [id number] 1960-12-15  Procedure: Insertion of Central Venous dialysis Catheter Indications: dialysis  Procedure Details Consent: Unable to obtain consent because of altered level of consciousness. Time Out: Verified patient identification, verified procedure, site/side was marked, verified correct patient position, special equipment/implants available, medications/allergies/relevent history reviewed, required imaging and test results available.  Performed  Maximum sterile technique was used including antiseptics, cap, gloves, gown, hand hygiene, mask and sheet. Skin prep: Chlorhexidine; local anesthetic administered A antimicrobial bonded/coated triple lumen catheter was placed in the right femoral vein due to patient being a dialysis patient using the Seldinger technique.  Evaluation Blood flow good Complications: No apparent complications Patient did tolerate procedure well. Chest X-ray ordered to verify placement.  CXR: not indicated   BABCOCK,PETE 03/10/2013, 12:11 AM  U/S used in placement.  Patient seen and examined, agree with above note.  I dictated the care and orders written for this patient under my direction.  Rush Farmer, MD [phone]

## 2013-03-10 NOTE — Progress Notes (Signed)
Wound stable Patient examined and I agree with the assessment and plan  Georganna Skeans, MD, MPH, FACS Pager: [phone]  03/10/2013 9:18 AM

## 2013-03-10 NOTE — Progress Notes (Signed)
Admit: 03/05/2013 LOS: 83  47F with AKI in setting of CKD3 (BL 1.2-1.8) 2/2 ATN in setting of hypoxic/hypercapneic VDRF, necrotizing L buttock skin infection, and septic shock.    Subjective:  Off pressors, soft BPs- currently agitated on vent- needing more sedation- Issue with access- clotted - new femoral HD cath placed, working better - balance overall neg on CRRT- surgery in evaluating wound- saying it looks good    02/09 0701 - 02/10 0700 In: 1720 [I.V.:697.5; NG/GT:592.5; IV Piggyback:400] Out: 3109 [Urine:462]  Filed Weights   03/08/13 0500 03/09/13 0400 03/10/13 0500  Weight: 132.5 kg (292 lb 1.8 oz) 127.4 kg (280 lb 13.9 oz) 126 kg (277 lb 12.5 oz)    Current meds: reviewed  Current Labs: reviewed    Physical Exam:  Blood pressure 106/45, pulse 79, temperature 97.5 F (36.4 C), temperature source Oral, resp. rate 7, height 5\' 7"  (1.702 m), weight 126 kg (277 lb 12.5 oz), SpO2 98.00%. GEN: obese, intubated, sedated  ENT: hirsuit, ETT in place  EYES: eyes closed  CV: RRR, nl s1s2  PULM: coarse bs b/l  ABD: s/nt/nd, obese  SKIN: no rashes/lesions  GU: foley catheter  PSYCH: unable to assess  EXT:1+ LEE   Assessment  47F with AKI in setting of CKD3 (BL 1.2-1.8) 2/2 ATN in setting of hypoxic/hypercapneic VDRF, necrotizing L buttock skin infection, and septic shock.  1. AKI 2/2 ATN, anuric 2/2 sepsis + ?TMP/SMX, ARB, NSAID 2. Inc AG Metabolic Acidosis + Resp Acidosis (failure to compensate) 3. CKD3 4. Septic Shock 2/2 polymicrobial / E.coli necrotizing skin infection on Vanc/Zosyn 5. Hypoxic and Hypercapneic VDRF 6. Morbid Obesity 7. Uncontrolled DM2, admit A1c 11.3% PLAN  1. Continue CRRT today with some attempted UF given no pressors and grossly overloaded- will continue at least one more day mostly for volume sake  2. Will follow closely 3. Electrolytes are stable  4. No NSAIDS, judicious IV contrast 5. ABX per CCM- WBC coming down 6. Anemia- multifactorial-  added aranesp- transfuse as needed - stable for now  Cherron Blitzer A  03/10/2013, 7:34 AM   Recent Labs Lab 03/09/13 0430 03/09/13 1211 03/09/13 1539 03/10/13 0405  NA 143  --  140 141  K 4.0  --  4.2 4.3  CL 102  --  100 102  CO2 24  --  26 23  GLUCOSE 214*  --  189* 213*  BUN 46*  --  33* 27*  CREATININE 3.76*  --  2.61* 2.13*  CALCIUM 7.5*  --  7.7* 8.2*  PHOS 3.4 4.0 3.6 3.4  3.5    Recent Labs Lab 03/05/13 1235  03/07/13 0825 03/08/13 0500 03/09/13 0430  WBC 25.7*  < > 18.9* 15.8* 15.4*  NEUTROABS 22.6*  --   --   --   --   HGB 13.0  < > 10.7* 9.7* 10.4*  HCT 36.4  < > 31.9* 29.0* 30.7*  MCV 93.8  < > 97.3 96.3 96.5  PLT 384  < > 361 375 390  < > = values in this interval not displayed.

## 2013-03-10 NOTE — Progress Notes (Signed)
[place] [facility]    Name: Stephanie Peck MRN: [id number] DOB: December 05, 1960     ADMISSION DATE: 03/05/2013  CONSULTATION DATE: 03/05/13  REFERRING MD : TRH-->PCCM (2/5)   CHIEF COMPLAINT: Acute Respiratory Failure / Perirectal abscess & Sepsis   BRIEF PATIENT DESCRIPTION: [name] , with PMH of DM, GERD, Anxiety, COPD, Severe OSA, COPD, Morbid Obesity who presented to [facility] ER on 2/5 with a 4 day history of worsening left buttock abscess. CT of pelvis demonstrated multiple fluid dense areas within the subq fat of left buttock, stranding in the fat extending to anus compatible with cellulitis, and focal hemorrhage in left inguinal region, adjacent soft tissue mass.   SIGNIFICANT EVENTS / STUDIES:  2/5 - Admit with 4 day hx of worsening abscess on L buttock, fevers, chills. To OR per CCS. Returned to ICU on vent.  2/5 - CT Pelvis: multiple fluid dense areas within the subq fat of left buttock, stranding in the fat extending to anus compatible with cellulitis, and focal hemorrhage in left inguinal region, adjacent soft tissue mass (?lymph node vs additional small hemorrhage). 2/6 2D echo>>> EF55-60%,  Mild AS, trivial pericardial effusion,   LINES / TUBES:  OETT 2/5>>> Rt IJ CVL 2/5>>> clotted 2/10 R femoral vein cath 2/10>>  CULTURES:  BCx2 2/5>>> NGTD Wound Culture 2/5>>> EColi  Abscess culture (OR) 2/5>>>GNR>>>EColi  Flu 2/5>>> neg  ANTIBIOTICS:  Vanco 2/5>>> 2/7 Zosyn 2/5>>> 2/8 Cipro 2/8>>>  SUBJECTIVE:  Renal failure on CRT, UOP 462/24h .  No plans for return to OR at this time.   VITAL SIGNS: Temp:  [94.4 F (34.7 C)-97.5 F (36.4 C)] 97.5 F (36.4 C) (02/10 0353) Pulse Rate:  [65-96] 84 (02/10 0600) Resp:  [7-32] 9 (02/10 0600) BP: (91-156)/(46-81) 127/49 mmHg (02/10 0600) SpO2:  [88 %-100 %] 94 % (02/10 0600) Arterial Line BP: (124-200)/(55-83) 153/70 mmHg (02/09 1100) FiO2 (%):  [40 %-50 %] 50 % (02/10 0315) Weight:  [277 lb 12.5 oz (126 kg)] 277 lb 12.5 oz (126 kg) (02/10  0500) HEMODYNAMICS: CVP:  [15 mmHg-16 mmHg] 16 mmHg VENTILATOR SETTINGS: Vent Mode:  [-] PCV FiO2 (%):  [40 %-50 %] 50 % Set Rate:  [8 bmp-30 bmp] 8 bmp PEEP:  [5 cmH20] 5 cmH20 Plateau Pressure:  [16 cmH20-27 cmH20] 16 cmH20 INTAKE / OUTPUT: Intake/Output     02/09 0701 - 02/10 0700   I.V. (mL/kg) 697.5 (5.5)   Other 30   NG/GT 592.5   IV Piggyback 400   Total Intake(mL/kg) 1720 (13.7)   Urine (mL/kg/hr) 462 (0.2)   Other 2647 (0.9)   Total Output 3109   Net -1389        PHYSICAL EXAMINATION: General: Morbidly obese female in NAD on vent  Neuro: sedated on vent, opens eyes to voice only HEENT: OETT, short / thick neck  Cardiovascular: s1s2 rrr, murmur appreciated, distant tones  Lungs: resp's even/non-labored on vent, very diminished bases   Abdomen: Obese, soft, bs active  Skin: Warm/dry, L buttock dressing c/d/i Ext: warm, well perfused, +1 pitting edema bilateral ext.  LABS:  CBC  Recent Labs Lab 03/07/13 0825 03/08/13 0500 03/09/13 0430  WBC 18.9* 15.8* 15.4*  HGB 10.7* 9.7* 10.4*  HCT 31.9* 29.0* 30.7*  PLT 361 375 390   Coag's  Recent Labs Lab 03/06/13 1052 03/09/13 0430 03/10/13 0553  APTT 39* 51* 59*  INR 1.28  --   --    BMET  Recent Labs Lab 03/09/13 0430 03/09/13 1539 03/10/13  0405  NA 143 140 141  K 4.0 4.2 4.3  CL 102 100 102  CO2 24 26 23   BUN 46* 33* 27*  CREATININE 3.76* 2.61* 2.13*  GLUCOSE 214* 189* 213*   Electrolytes  Recent Labs Lab 03/09/13 0430 03/09/13 1211 03/09/13 1539 03/10/13 0405  CALCIUM 7.5*  --  7.7* 8.2*  MG 2.2 2.4  --  2.3  PHOS 3.4 4.0 3.6 3.4  3.5   Sepsis Markers  Recent Labs Lab 03/05/13 1235 03/05/13 2248  LATICACIDVEN 1.6 1.3   ABG  Recent Labs Lab 03/07/13 1216 03/08/13 0326 03/09/13 1306  PHART 7.328* 7.392 7.320*  PCO2ART 38.2 37.5 53.4*  PO2ART 82.0 74.4* 62.0*   Liver Enzymes  Recent Labs Lab 03/05/13 1235 03/07/13 1600 03/09/13 1539 03/10/13 0405  AST 37  --    --   --   ALT 21  --   --   --   ALKPHOS 122*  --   --   --   BILITOT 0.3  --   --   --   ALBUMIN 2.4* 1.9* 1.9* 2.1*   Cardiac Enzymes  Recent Labs Lab 03/05/13 1455 03/05/13 2247 03/06/13 0433  TROPONINI <0.30 <0.30 <0.30  PROBNP 443.4*  --   --    Glucose  Recent Labs Lab 03/09/13 0740 03/09/13 1157 03/09/13 1538 03/09/13 1902 03/10/13 0013 03/10/13 0352  GLUCAP 182* 236* 179* 149* 160* 197*   Urinalysis    Component Value Date/Time   COLORURINE AMBER* 03/07/2013 1538   APPEARANCEUR TURBID* 03/07/2013 1538   LABSPEC 1.029 03/07/2013 1538   PHURINE 5.0 03/07/2013 1538   GLUCOSEU 100* 03/07/2013 1538   HGBUR LARGE* 03/07/2013 1538   BILIRUBINUR MODERATE* 03/07/2013 1538   KETONESUR 15* 03/07/2013 1538   PROTEINUR 30* 03/07/2013 1538   UROBILINOGEN 1.0 03/07/2013 1538   NITRITE NEGATIVE 03/07/2013 1538   LEUKOCYTESUR SMALL* 03/07/2013 1538   Imaging Dg Chest Port 1 View  03/09/2013   CLINICAL DATA:  Respiratory failure.  EXAM: PORTABLE CHEST - 1 VIEW  COMPARISON:  03/08/2013  FINDINGS: Bilateral airspace and interstitial densities are mildly improved.  No pneumothorax.  Support apparatus is stable and well positioned.  IMPRESSION: Mild improvement in pulmonary edema.   Electronically Signed   By: Lajean Manes M.D.   On: 03/09/2013 07:16   Dg Chest Portable 1 View  03/08/2013   CLINICAL DATA:  Central line placement  EXAM: PORTABLE CHEST - 1 VIEW  COMPARISON:  Same day  FINDINGS: Right internal jugular central line has its tip in the SVC at the azygos level. Endotracheal tube has its tip 3.5 cm above the carina. New left internal jugular central line has its tip at the innominate SVC junction. No pneumothorax. Pulmonary edema is worsened in general.  IMPRESSION: New left internal jugular catheter has its tip at the innominate SVC junction. No pneumothorax. Worsened pulmonary edema.   Electronically Signed   By: Nelson Chimes M.D.   On: 03/08/2013 15:25   Dg Abd Portable 1v  03/08/2013    CLINICAL DATA:  Abdominal distention  EXAM: PORTABLE ABDOMEN - 1 VIEW  COMPARISON:  03/06/2013  FINDINGS: Scattered large and small bowel gas is noted. A nasogastric catheter is coiled within the stomach. No obstructive changes are seen. Mild degenerative change the lumbar spine is noted.  IMPRESSION: No acute abnormality seen.   Electronically Signed   By: Inez Catalina M.D.   On: 03/08/2013 10:46   ASSESSMENT / PLAN:  PULMONARY  A:  Acute Respiratory Failure  COPD  OSA  P:  - CXR: no acute changes yesterday - Minimize sedation as able. - Will need CPAP QHS post extubation. - Family deciding on trach/peg.  CARDIOVASCULAR  A:  CHF Hypotension - improved  Shock, etiology, likely septic  P:  - Monitor CVP (16); goal 12. - Hold home cozaar. - Off pressors.  RENAL  A:  Acute Renal failure - ATN likely - in setting of sepsis (CKD stage 3) Hyperkalemia improved Metabolic acidosis - improving on CRT  UOP-  P:  - Negative balance, UOP 462/24h. - Cr improving, electrolytes stable.   - Hold nephrotoxic agents. - Cont HCO3 gtt. - CRRT started 2/8 and continue at -50 ml/hr. - RIJ clotted last night, Right fem cath placement. - Renal consulting.  GASTROINTESTINAL  A:  GERD  Hx Gastric Bypass  Constipation P:  - Protonix while intubated, not on PPI as outpt. - TF, continue  HEMATOLOGIC  A:  Chronic Anemia  coags ok  P:  - Hold PO iron and Aranesp. - SCD's / heparin for DVT.  INFECTIOUS  A:  Left Buttock Abscess / Perirectal Cellulitis- s/p I&D 2/5.  Ecoli in both cultures- both pan sens  P:  - Abx & cultures as above. - No immediate plans to return to OR at this time.  - Cipro 2/8, continue.  ENDOCRINE  A:  Diabetes  Gout  P:  - SSI with resistant scale  - Lantus 10u 2/8, increase to 13u today for elevated CBG - Hold januvia, janumet, indocin  CBG (last 3)   Recent Labs  03/09/13 1902 03/10/13 0013 03/10/13 0352  GLUCAP 149* 160* 197*   NEUROLOGIC   A:  Pain  Acute Encephalopathy  Depression / Anxiety  P:  - Fentanyl gtt for pain (300 mcg)  - PRN versed for sedation needs  - Hold cymbalta, wellbutrin, neurontin, topamax   Kuneff, Renee DO PGY-2  Summary: Vent change to avoid dysynchrony, minimize sedation, LCB per niece (was DNR prior, now LCB with no CPR and no cardioversion), family discussing trach/peg/long term dialysis for prognostic purposes.  I have personally obtained a history, examined the patient, evaluated laboratory and imaging results, formulated the assessment and plan and placed orders.  CRITICAL CARE: The patient is critically ill with multiple organ systems failure and requires high complexity decision making for assessment and support, frequent evaluation and titration of therapies, application of advanced monitoring technologies and extensive interpretation of multiple databases. Critical Care Time devoted to patient care services described in this note is 35 minutes.   Rush Farmer, M.D. [facility] Pulmonary/Critical Care Medicine. Pager: [phone]. After hours pager: [phone].

## 2013-03-11 ENCOUNTER — Inpatient Hospital Stay (HOSPITAL_COMMUNITY): Payer: Medicare Other

## 2013-03-11 LAB — POCT ACTIVATED CLOTTING TIME
ACTIVATED CLOTTING TIME: 171 s
ACTIVATED CLOTTING TIME: 188 s

## 2013-03-11 LAB — GLUCOSE, CAPILLARY
GLUCOSE-CAPILLARY: 237 mg/dL — AB (ref 70–99)
GLUCOSE-CAPILLARY: 279 mg/dL — AB (ref 70–99)
GLUCOSE-CAPILLARY: 312 mg/dL — AB (ref 70–99)
Glucose-Capillary: 218 mg/dL — ABNORMAL HIGH (ref 70–99)
Glucose-Capillary: 254 mg/dL — ABNORMAL HIGH (ref 70–99)
Glucose-Capillary: 274 mg/dL — ABNORMAL HIGH (ref 70–99)

## 2013-03-11 LAB — CULTURE, BLOOD (ROUTINE X 2)
CULTURE: NO GROWTH
Culture: NO GROWTH

## 2013-03-11 LAB — RENAL FUNCTION PANEL
Albumin: 2.1 g/dL — ABNORMAL LOW (ref 3.5–5.2)
BUN: 22 mg/dL (ref 6–23)
CALCIUM: 8.7 mg/dL (ref 8.4–10.5)
CO2: 26 meq/L (ref 19–32)
CREATININE: 1.39 mg/dL — AB (ref 0.50–1.10)
Chloride: 100 mEq/L (ref 96–112)
GFR, EST AFRICAN AMERICAN: 49 mL/min — AB (ref >90)
GFR, EST NON AFRICAN AMERICAN: 42 mL/min — AB (ref >90)
Glucose, Bld: 244 mg/dL — ABNORMAL HIGH (ref 70–99)
PHOSPHORUS: 2.8 mg/dL (ref 2.3–4.6)
Potassium: 4.1 mEq/L (ref 3.7–5.3)
SODIUM: 139 meq/L (ref 137–147)

## 2013-03-11 LAB — POCT I-STAT 3, ART BLOOD GAS (G3+)
Acid-Base Excess: 2 mmol/L (ref 0.0–2.0)
Bicarbonate: 27.7 mEq/L — ABNORMAL HIGH (ref 20.0–24.0)
O2 Saturation: 98 %
PCO2 ART: 48.4 mmHg — AB (ref 35.0–45.0)
PO2 ART: 110 mmHg — AB (ref 80.0–100.0)
Patient temperature: 98.6
TCO2: 29 mmol/L (ref 0–100)
pH, Arterial: 7.365 (ref 7.350–7.450)

## 2013-03-11 LAB — MAGNESIUM: MAGNESIUM: 2.4 mg/dL (ref 1.5–2.5)

## 2013-03-11 LAB — CBC
HCT: 33.7 % — ABNORMAL LOW (ref 36.0–46.0)
Hemoglobin: 11 g/dL — ABNORMAL LOW (ref 12.0–15.0)
MCH: 32.4 pg (ref 26.0–34.0)
MCHC: 32.6 g/dL (ref 30.0–36.0)
MCV: 99.4 fL (ref 78.0–100.0)
Platelets: 439 10*3/uL — ABNORMAL HIGH (ref 150–400)
RBC: 3.39 MIL/uL — ABNORMAL LOW (ref 3.87–5.11)
RDW: 13.6 % (ref 11.5–15.5)
WBC: 14.8 10*3/uL — ABNORMAL HIGH (ref 4.0–10.5)

## 2013-03-11 LAB — DIFFERENTIAL
BASOS PCT: 0 % (ref 0–1)
Basophils Absolute: 0 10*3/uL (ref 0.0–0.1)
EOS ABS: 0.1 10*3/uL (ref 0.0–0.7)
Eosinophils Relative: 1 % (ref 0–5)
Lymphocytes Relative: 14 % (ref 12–46)
Lymphs Abs: 2.1 10*3/uL (ref 0.7–4.0)
Monocytes Absolute: 0.7 10*3/uL (ref 0.1–1.0)
Monocytes Relative: 5 % (ref 3–12)
NEUTROS PCT: 80 % — AB (ref 43–77)
Neutro Abs: 11.9 10*3/uL — ABNORMAL HIGH (ref 1.7–7.7)

## 2013-03-11 LAB — APTT: aPTT: 52 seconds — ABNORMAL HIGH (ref 24–37)

## 2013-03-11 MED ORDER — HEPARIN SODIUM (PORCINE) 1000 UNIT/ML DIALYSIS
1000.0000 [IU] | INTRAMUSCULAR | Status: DC | PRN
  Administered 2013-03-11: 3200 [IU] via INTRAVENOUS_CENTRAL
  Filled 2013-03-11: qty 6
  Filled 2013-03-11: qty 4

## 2013-03-11 MED ORDER — FUROSEMIDE 10 MG/ML IJ SOLN
40.0000 mg | Freq: Two times a day (BID) | INTRAMUSCULAR | Status: DC
  Administered 2013-03-11 (×2): 40 mg via INTRAVENOUS
  Filled 2013-03-11 (×4): qty 4

## 2013-03-11 MED ORDER — INSULIN GLARGINE 100 UNIT/ML ~~LOC~~ SOLN
30.0000 [IU] | Freq: Every day | SUBCUTANEOUS | Status: DC
  Administered 2013-03-11: 30 [IU] via SUBCUTANEOUS
  Filled 2013-03-11: qty 0.3

## 2013-03-11 NOTE — Progress Notes (Signed)
Admit: 03/05/2013 LOS: 38  58F with AKI in setting of CKD3 (BL 1.2-1.8) 2/2 ATN in setting of hypoxic/hypercapneic VDRF, necrotizing L buttock skin infection, and septic shock.    Subjective:  CRRT filter clotted this AM- is making more urine- numbers looking good- hemodynamically stable    02/10 0701 - 02/11 0700 In: 7026 [I.V.:835; NG/GT:1810; IV Piggyback:400] Out: 4458 [Urine:860]  Filed Weights   03/09/13 0400 03/10/13 0500 03/11/13 0424  Weight: 127.4 kg (280 lb 13.9 oz) 126 kg (277 lb 12.5 oz) 122.1 kg (269 lb 2.9 oz)    Current meds: reviewed  Current Labs: reviewed    Physical Exam:  Blood pressure 152/66, pulse 95, temperature 97.5 F (36.4 C), temperature source Oral, resp. rate 16, height 5\' 7"  (1.702 m), weight 122.1 kg (269 lb 2.9 oz), SpO2 96.00%. GEN: obese, intubated, more alert ENT: hirsuit, ETT in place  CV: RRR, nl s1s2  PULM: coarse bs b/l  ABD: s/nt/nd, obese  SKIN: no rashes/lesions  GU: foley catheter  PSYCH: unable to assess  EXT:1+ LEE   Assessment  58F with AKI in setting of CKD3 (BL 1.2-1.8) 2/2 ATN in setting of hypoxic/hypercapneic VDRF, necrotizing L buttock skin infection, and septic shock.  1. AKI 2/2 ATN, anuric 2/2 sepsis + ?TMP/SMX, ARB, NSAID 2. Inc AG Metabolic Acidosis + Resp Acidosis (failure to compensate) 3. CKD3 4. Septic Shock 2/2 polymicrobial / E.coli necrotizing skin infection on Vanc/Zosyn 5. Hypoxic and Hypercapneic VDRF 6. Morbid Obesity 7. Uncontrolled DM2, admit A1c 11.3% PLAN  1. Given increased UOP and good looking numbers, will hold CRRT and follow clinically for further HD need.  Metabolic acidosis resolved.  Will start he on a little lasix to assist with volume management.    2. Will follow closely 3. Electrolytes are stable  4. No NSAIDS, judicious IV contrast 5. ABX and VDRF  per CCM- WBC coming down- now on cipro 6. Anemia- multifactorial- added aranesp- transfuse as needed - stable for  now  Leata Dominy A  03/11/2013, 8:20 AM   Recent Labs Lab 03/10/13 0405 03/10/13 1500 03/11/13 0430  NA 141 138 139  K 4.3 4.7 4.1  CL 102 102 100  CO2 23 24 26   GLUCOSE 213* 316* 244*  BUN 27* 26* 22  CREATININE 2.13* 1.74* 1.39*  CALCIUM 8.2* 8.2* 8.7  PHOS 3.4  3.5 3.9  3.9 2.8    Recent Labs Lab 03/05/13 1235  03/09/13 0430 03/10/13 0730 03/11/13 0430  WBC 25.7*  < > 15.4* 15.4* 14.8*  NEUTROABS 22.6*  --   --  12.7* 11.9*  HGB 13.0  < > 10.4* 11.1* 11.0*  HCT 36.4  < > 30.7* 34.6* 33.7*  MCV 93.8  < > 96.5 100.0 99.4  PLT 384  < > 390 401* 439*  < > = values in this interval not displayed.

## 2013-03-11 NOTE — Progress Notes (Signed)
[place] P[facility]M    Name: Stephanie Peck MRN: [id number] DOB: 05-Sep-1960     ADMISSION DATE: 03/05/2013  CONSULTATION DATE: 03/05/13  REFERRING MD : TRH-->PCCM (2/5)   CHIEF COMPLAINT: Acute Respiratory Failure / Perirectal abscess & Sepsis   BRIEF PATIENT DESCRIPTION: [name] , with PMH of DM, GERD, Anxiety, COPD, Severe OSA, COPD, Morbid Obesity who presented to [facility] ER on 2/5 with a 4 day history of worsening left buttock abscess. CT of pelvis demonstrated multiple fluid dense areas within the subq fat of left buttock, stranding in the fat extending to anus compatible with cellulitis, and focal hemorrhage in left inguinal region, adjacent soft tissue mass.   SIGNIFICANT EVENTS / STUDIES:  2/5 - Admit with 4 day hx of worsening abscess on L buttock, fevers, chills. To OR per CCS. Returned to ICU on vent.  2/5 - CT Pelvis: multiple fluid dense areas within the subq fat of left buttock, stranding in the fat extending to anus compatible with cellulitis, and focal hemorrhage in left inguinal region, adjacent soft tissue mass (?lymph node vs additional small hemorrhage). 2/6 2D echo>>> EF55-60%,  Mild AS, trivial pericardial effusion,   LINES / TUBES:  OETT 2/5>>> Rt IJ CVL 2/5>> R femoral vein cath 2/10>>  CULTURES:  BCx2 2/5>>> NGTD Wound Culture 2/5>>> EColi  Abscess culture (OR) 2/5>>>GNR>>>EColi  Flu 2/5>>> neg  ANTIBIOTICS:  Vanco 2/5>>> 2/7 Zosyn 2/5>>> 2/8 Cipro 2/8>>>  SUBJECTIVE:  Renal failure on CRT, UOP 860/24h .  No plans for return to OR at this time.   VITAL SIGNS: Temp:  [97 F (36.1 C)-97.6 F (36.4 C)] 97.5 F (36.4 C) (02/11 0418) Pulse Rate:  [72-103] 95 (02/11 0721) Resp:  [7-22] 16 (02/11 0721) BP: (107-175)/(48-89) 152/66 mmHg (02/11 0721) SpO2:  [93 %-100 %] 96 % (02/11 0721) FiO2 (%):  [50 %] 50 % (02/11 0721) Weight:  [269 lb 2.9 oz (122.1 kg)] 269 lb 2.9 oz (122.1 kg) (02/11 0424) HEMODYNAMICS: CVP:  [12 mmHg-17 mmHg] 16 mmHg VENTILATOR  SETTINGS: Vent Mode:  [-] PCV FiO2 (%):  [50 %] 50 % Set Rate:  [8 bmp] 8 bmp PEEP:  [5 cmH20] 5 cmH20 Pressure Support:  [8 cmH20] 8 cmH20 Plateau Pressure:  [16 cmH20-23 cmH20] 20 cmH20 INTAKE / OUTPUT: Intake/Output     02/10 0701 - 02/11 0700 02/11 0701 - 02/12 0700   I.V. (mL/kg) 835 (6.8)    Other     NG/GT 1810    IV Piggyback 400    Total Intake(mL/kg) 3045 (24.9)    Urine (mL/kg/hr) 860 (0.3)    Other 3598 (1.2)    Total Output 4458     Net -1413           PHYSICAL EXAMINATION: General: Morbidly obese female in NAD on vent.  Neuro: sedated on vent, opens eyes to voice only HEENT: OETT, short / thick neck  Cardiovascular: s1s2 rrr, murmur appreciated, distant tones  Lungs: resp's even/non-labored on vent, very diminished bases. Course rhonchi Abdomen: Obese, soft, bs active  Skin: Warm/dry, L buttock dressing c/d/i Ext: warm, well perfused, trace pitting edema bilateral ext.  LABS:  CBC  Recent Labs Lab 03/09/13 0430 03/10/13 0730 03/11/13 0430  WBC 15.4* 15.4* 14.8*  HGB 10.4* 11.1* 11.0*  HCT 30.7* 34.6* 33.7*  PLT 390 401* 439*   Coag's  Recent Labs Lab 03/06/13 1052 03/09/13 0430 03/10/13 0553 03/11/13 0430  APTT 39* 51* 59* 52*  INR 1.28  --   --   --  BMET  Recent Labs Lab 03/10/13 0405 03/10/13 1500 03/11/13 0430  NA 141 138 139  K 4.3 4.7 4.1  CL 102 102 100  CO2 23 24 26   BUN 27* 26* 22  CREATININE 2.13* 1.74* 1.39*  GLUCOSE 213* 316* 244*   Electrolytes  Recent Labs Lab 03/10/13 0405 03/10/13 1500 03/11/13 0430  CALCIUM 8.2* 8.2* 8.7  MG 2.3 2.4 2.4  PHOS 3.4  3.5 3.9  3.9 2.8   Sepsis Markers  Recent Labs Lab 03/05/13 1235 03/05/13 2248  LATICACIDVEN 1.6 1.3   ABG  Recent Labs Lab 03/08/13 0326 03/09/13 1306 03/11/13 0420  PHART 7.392 7.320* 7.365  PCO2ART 37.5 53.4* 48.4*  PO2ART 74.4* 62.0* 110.0*   Liver Enzymes  Recent Labs Lab 03/05/13 1235  03/10/13 0405 03/10/13 1500 03/11/13 0430   AST 37  --   --   --   --   ALT 21  --   --   --   --   ALKPHOS 122*  --   --   --   --   BILITOT 0.3  --   --   --   --   ALBUMIN 2.4*  < > 2.1* 1.7* 2.1*  < > = values in this interval not displayed. Cardiac Enzymes  Recent Labs Lab 03/05/13 1455 03/05/13 2247 03/06/13 0433  TROPONINI <0.30 <0.30 <0.30  PROBNP 443.4*  --   --    Glucose  Recent Labs Lab 03/10/13 0759 03/10/13 1140 03/10/13 1529 03/10/13 1859 03/11/13 0018 03/11/13 0425  GLUCAP 223* 272* 273* 232* 237* 218*   Urinalysis    Component Value Date/Time   COLORURINE AMBER* 03/07/2013 1538   APPEARANCEUR TURBID* 03/07/2013 1538   LABSPEC 1.029 03/07/2013 1538   PHURINE 5.0 03/07/2013 1538   GLUCOSEU 100* 03/07/2013 1538   HGBUR LARGE* 03/07/2013 1538   BILIRUBINUR MODERATE* 03/07/2013 1538   KETONESUR 15* 03/07/2013 1538   PROTEINUR 30* 03/07/2013 1538   UROBILINOGEN 1.0 03/07/2013 1538   NITRITE NEGATIVE 03/07/2013 1538   LEUKOCYTESUR SMALL* 03/07/2013 1538   Imaging Dg Chest Port 1 View  03/10/2013   CLINICAL DATA:  Intubation.  EXAM: PORTABLE CHEST - 1 VIEW  COMPARISON:  03/09/2013  FINDINGS: Irregular interstitial densities and mild airspace opacity is similar to the prior exam suggesting mild residual pulmonary edema. There is more confluent lung base opacity, vertically on the left, also stable likely due to atelectasis. A small effusion on the left is suggested.  Right internal jugular central venous line, endotracheal tube and nasogastric tube are stable in well positioned.  IMPRESSION: No change from the previous day's study. Findings are consistent with mild persistent pulmonary edema as well as lung base atelectasis. Support apparatus is stable in well positioned.   Electronically Signed   By: Lajean Manes M.D.   On: 03/10/2013 12:00   ASSESSMENT / PLAN:  PULMONARY  A:  Acute Respiratory Failure  COPD  OSA  P:  - CXR: pending - Minimize sedation as able. - Family deciding on trach/peg. Family meeting today  ~1030a  CARDIOVASCULAR  A:  CHF Hypotension - improved  Shock,  likely septic  P:  - Monitor CVP (16); goal 12. CRT negative ~ 38ml/hr but now off CVVH and on lasix as ordered. - Hold home cozaar. - Off pressors.  RENAL  A:  Acute Renal failure - ATN likely - in setting of sepsis (CKD stage 3) Hyperkalemia improved Metabolic acidosis - improving on CRT  UOP-  P:  -  Negative balance, UOP ~800/24h. - Cr baseline today, electrolytes stable.   - Hold nephrotoxic agents. - D/C HCO3 gtt. - CRRT started 2/8, overnight negative ~5ml/hr d/t to elevated CVP now to lasix since UOP is improved and off CVVH. - Renal consulting.  GASTROINTESTINAL  A:  GERD  Hx Gastric Bypass  P:  - Protonix while intubated, not on PPI as outpt. - TF, continue  HEMATOLOGIC  A:  Chronic Anemia  coags ok  P:  - Hold PO iron and Aranesp. - SCD's / heparin for DVT.  INFECTIOUS  A:  Left Buttock Abscess / Perirectal Cellulitis- s/p I&D 2/5.  Ecoli in both cultures- both pan sens  P:  - Abx & cultures as above. - No immediate plans to return to OR at this time.  - Cipro 2/8, continue.  ENDOCRINE  A:  Diabetes  Gout  P:  - SSI with resistant scale  - Lantus adjusted for elevated CBG - Hold januvia, janumet, indocin  CBG (last 3)   Recent Labs  03/10/13 1859 03/11/13 0018 03/11/13 0425  GLUCAP 232* 237* 218*   NEUROLOGIC  A:  Pain  Acute Encephalopathy  Depression / Anxiety  P:  - Fentanyl gtt for pain (300 mcg)  - PRN versed for sedation needs  - Hold cymbalta, wellbutrin, neurontin, topamax   Kuneff, Renee DO PGY-2  Summary: Vent change to avoid dysynchrony, minimize sedation, LCB per niece (was DNR prior, now LCB with no CPR and no cardioversion), extensive discussion with family, re-discussed the entire case.  They are to decide whether they want a trach or not by tomorrow then will proceed with that.  CC time 45 min.  Rush Farmer, M.D. [facility] Pulmonary/Critical  Care Medicine. Pager: [phone]. After hours pager: [phone].

## 2013-03-11 NOTE — Progress Notes (Signed)
ANTIBIOTIC CONSULT NOTE - FOLLOW UP  Pharmacy Consult:  Cipro Indication:  E.coli wound infection  Allergies  Allergen Reactions  . Celebrex [Celecoxib] Shortness Of Breath  . Codeine Nausea Only    Patient Measurements: Height: 5\' 7"  (170.2 cm) Weight: 269 lb 2.9 oz (122.1 kg) IBW/kg (Calculated) : 61.6  Vital Signs: Temp: 97.7 F (36.5 C) (02/11 0800) Temp src: Oral (02/11 0800) BP: 142/56 mmHg (02/11 1236) Pulse Rate: 123 (02/11 1236) Intake/Output from previous day: 02/10 0701 - 02/11 0700 In: 3045 [I.V.:835; NG/GT:1810; IV Piggyback:400] Out: 4458 [Urine:860] Intake/Output from this shift: Total I/O In: 760 [I.V.:130; NG/GT:430; IV Piggyback:200] Out: 784 [Urine:635; Other:149]  Labs:  Recent Labs  03/09/13 0430  03/10/13 0405 03/10/13 0730 03/10/13 1500 03/11/13 0430  WBC 15.4*  --   --  15.4*  --  14.8*  HGB 10.4*  --   --  11.1*  --  11.0*  PLT 390  --   --  401*  --  439*  CREATININE 3.76*  < > 2.13*  --  1.74* 1.39*  < > = values in this interval not displayed. Estimated Creatinine Clearance: 62.7 ml/min (by C-G formula based on Cr of 1.39). No results found for this basename: VANCOTROUGH, VANCOPEAK, VANCORANDOM, Newman, GENTPEAK, GENTRANDOM, TOBRATROUGH, TOBRAPEAK, TOBRARND, AMIKACINPEAK, AMIKACINTROU, AMIKACIN,  in the last 72 hours   Microbiology: Recent Results (from the past 720 hour(s))  CULTURE, BLOOD (ROUTINE X 2)     Status: None   Collection Time    03/05/13 12:37 PM      Result Value Ref Range Status   Specimen Description BLOOD LEFT FOREARM   Final   Special Requests BOTTLES DRAWN AEROBIC AND ANAEROBIC 5CCS   Final   Culture  Setup Time     Final   Value: 03/05/2013 16:28     Performed at [facility]   Culture     Final   Value: NO GROWTH 5 DAYS     Performed at [facility]   Report Status 03/11/2013 FINAL   Final  CULTURE, BLOOD (ROUTINE X 2)     Status: None   Collection Time    03/05/13 12:42 PM       Result Value Ref Range Status   Specimen Description BLOOD RIGHT FOREARM   Final   Special Requests BOTTLES DRAWN AEROBIC AND ANAEROBIC 5CCS   Final   Culture  Setup Time     Final   Value: 03/05/2013 16:29     Performed at [facility]   Culture     Final   Value: NO GROWTH 5 DAYS     Performed at [facility]   Report Status 03/11/2013 FINAL   Final  WOUND CULTURE     Status: None   Collection Time    03/05/13 12:42 PM      Result Value Ref Range Status   Specimen Description WOUND PERIRECTAL   Final   Special Requests Normal   Final   Gram Stain     Final   Value: NO WBC SEEN     NO SQUAMOUS EPITHELIAL CELLS SEEN     MODERATE GRAM NEGATIVE RODS     Performed at [facility]   Culture     Final   Value: MODERATE ESCHERICHIA COLI     Performed at [facility]   Report Status 03/07/2013 FINAL   Final   Organism ID, Bacteria ESCHERICHIA COLI   Final  MRSA PCR  SCREENING     Status: None   Collection Time    03/05/13  4:10 PM      Result Value Ref Range Status   MRSA by PCR NEGATIVE  NEGATIVE Final   Comment:            The GeneXpert MRSA Assay (FDA     approved for NASAL specimens     only), is one component of a     comprehensive MRSA colonization     surveillance program. It is not     intended to diagnose MRSA     infection nor to guide or     monitor treatment for     MRSA infections.  CULTURE, ROUTINE-ABSCESS     Status: None   Collection Time    03/05/13  8:46 PM      Result Value Ref Range Status   Specimen Description ABSCESS PERIRECTAL   Final   Special Requests PATIENT ON FOLLOWING VANCOMYCIN   Final   Gram Stain     Final   Value: MODERATE WBC PRESENT,BOTH PMN AND MONONUCLEAR     RARE SQUAMOUS EPITHELIAL CELLS PRESENT     ABUNDANT GRAM NEGATIVE RODS     FEW GRAM POSITIVE COCCI IN PAIRS     RARE GRAM POSITIVE RODS   Culture     Final   Value: MODERATE ESCHERICHIA COLI     Performed at [facility]   Report Status  03/08/2013 FINAL   Final   Organism ID, Bacteria ESCHERICHIA COLI   Final  ANAEROBIC CULTURE     Status: None   Collection Time    03/05/13  8:46 PM      Result Value Ref Range Status   Specimen Description ABSCESS PERIRECTAL   Final   Special Requests PATIENT ON FOLLOWING VANCOMYCIN   Final   Gram Stain     Final   Value: MODERATE WBC PRESENT,BOTH PMN AND MONONUCLEAR     RARE SQUAMOUS EPITHELIAL CELLS PRESENT     ABUNDANT GRAM NEGATIVE RODS     FEW GRAM POSITIVE COCCI IN PAIRS     RARE GRAM POSITIVE RODS   Culture     Final   Value: NO ANAEROBES ISOLATED; CULTURE IN PROGRESS FOR 5 DAYS     Performed at [facility]   Report Status PENDING   Incomplete  AFB CULTURE WITH SMEAR     Status: None   Collection Time    03/05/13  8:46 PM      Result Value Ref Range Status   Specimen Description ABSCESS PERIRECTAL   Final   Special Requests PATIENT ON FOLLOWING VANCOMYCIN   Final   ACID FAST SMEAR     Final   Value: NO ACID FAST BACILLI SEEN     Performed at [facility]   Culture     Final   Value: CULTURE WILL BE EXAMINED FOR 6 WEEKS BEFORE ISSUING A FINAL REPORT     Performed at [facility]   Report Status PENDING   Incomplete  FUNGUS CULTURE W SMEAR     Status: None   Collection Time    03/05/13  8:46 PM      Result Value Ref Range Status   Specimen Description ABSCESS PERIRECTAL   Final   Special Requests PATIENT ON FOLLOWING VANCOMYCIN   Final   Fungal Smear     Final   Value: NO YEAST OR FUNGAL ELEMENTS SEEN     Performed  at [facility]     Final   Value: CULTURE IN PROGRESS FOR FOUR WEEKS     Performed at [facility]   Report Status PENDING   Incomplete      Assessment: 85 YOF with E.coli cellulitis and large gluteal abscess s/p I&D to remain on Cipro therapy.  CRRT discontinued today so her renal clearance is uncertain.  Given patient's size will continue current dosing for now.  Cipro 2/8 >> Vanc 2/5 >>  2/8 Zosyn 2/5 >> 2/8  2/5 Abscess cx - E.coli (pan sensitive) 2/5 BC x 2 - NGTD 2/5 MSRA PCR - negative 2/5 fungus / AFB cx - pending   Goal of Therapy:  Clearance of infection   Plan:  - Continue Cipro 400mg  IV Q12H - F/U renal fxn in AM and adjust dosage as indicated    Taygen Acklin D. Mina Marble, PharmD, BCPS Pager:  [phone] 03/11/2013, 1:05 PM

## 2013-03-11 NOTE — Progress Notes (Signed)
Inpatient Diabetes Program Recommendations  AACE/ADA: New Consensus Statement on Inpatient Glycemic Control (2013)  Target Ranges:  Prepandial:   less than 140 mg/dL      Peak postprandial:   less than 180 mg/dL (1-2 hours)      Critically ill patients:  140 - 180 mg/dL   Reason for Visit: Results for PANG, ROBERS (MRN [id number]) as of 03/11/2013 11:33  Ref. Range 03/10/2013 15:29 03/10/2013 18:59 03/11/2013 00:18 03/11/2013 04:25 03/11/2013 07:55  Glucose-Capillary Latest Range: 70-99 mg/dL 273 (H) 232 (H) 237 (H) 218 (H) 254 (H)   Outpatient Diabetes medications: Levemir 48 units q HS Current orders for Inpatient glycemic control: Note that Lantus increased to 30 units q HS.  Consider adding Novolog meal coverage 3 units q 4 hours to cover tube feeds.    Adah Perl, RN, BC-ADM Inpatient Diabetes Coordinator Pager [phone]

## 2013-03-12 ENCOUNTER — Inpatient Hospital Stay (HOSPITAL_COMMUNITY): Payer: Medicare Other

## 2013-03-12 LAB — GLUCOSE, CAPILLARY
GLUCOSE-CAPILLARY: 279 mg/dL — AB (ref 70–99)
GLUCOSE-CAPILLARY: 289 mg/dL — AB (ref 70–99)
GLUCOSE-CAPILLARY: 295 mg/dL — AB (ref 70–99)
Glucose-Capillary: 339 mg/dL — ABNORMAL HIGH (ref 70–99)
Glucose-Capillary: 291 mg/dL — ABNORMAL HIGH (ref 70–99)
Glucose-Capillary: 277 mg/dL — ABNORMAL HIGH (ref 70–99)

## 2013-03-12 LAB — CBC WITH DIFFERENTIAL/PLATELET
Basophils Absolute: 0.1 10*3/uL (ref 0.0–0.1)
Basophils Relative: 0 % (ref 0–1)
Eosinophils Absolute: 0.2 10*3/uL (ref 0.0–0.7)
Eosinophils Relative: 1 % (ref 0–5)
HEMATOCRIT: 32.5 % — AB (ref 36.0–46.0)
Hemoglobin: 10.7 g/dL — ABNORMAL LOW (ref 12.0–15.0)
LYMPHS ABS: 2.4 10*3/uL (ref 0.7–4.0)
Lymphocytes Relative: 16 % (ref 12–46)
MCH: 32.6 pg (ref 26.0–34.0)
MCHC: 32.9 g/dL (ref 30.0–36.0)
MCV: 99.1 fL (ref 78.0–100.0)
MONO ABS: 0.7 10*3/uL (ref 0.1–1.0)
Monocytes Relative: 5 % (ref 3–12)
NEUTROS ABS: 11.8 10*3/uL — AB (ref 1.7–7.7)
NEUTROS PCT: 78 % — AB (ref 43–77)
Platelets: 439 10*3/uL — ABNORMAL HIGH (ref 150–400)
RBC: 3.28 MIL/uL — AB (ref 3.87–5.11)
RDW: 13.4 % (ref 11.5–15.5)
WBC: 15.1 10*3/uL — ABNORMAL HIGH (ref 4.0–10.5)

## 2013-03-12 LAB — BLOOD GAS, ARTERIAL
ACID-BASE EXCESS: 3.9 mmol/L — AB (ref 0.0–2.0)
Bicarbonate: 29.1 mEq/L — ABNORMAL HIGH (ref 20.0–24.0)
DRAWN BY: 36496
FIO2: 0.4 %
LHR: 8 {breaths}/min
O2 Saturation: 94.6 %
PATIENT TEMPERATURE: 98.1
PEEP/CPAP: 5 cmH2O
Pressure control: 20 cmH2O
TCO2: 30.8 mmol/L (ref 0–100)
pCO2 arterial: 53.4 mmHg — ABNORMAL HIGH (ref 35.0–45.0)
pH, Arterial: 7.354 (ref 7.350–7.450)
pO2, Arterial: 77.6 mmHg — ABNORMAL LOW (ref 80.0–100.0)

## 2013-03-12 LAB — RENAL FUNCTION PANEL
Albumin: 2.1 g/dL — ABNORMAL LOW (ref 3.5–5.2)
BUN: 37 mg/dL — ABNORMAL HIGH (ref 6–23)
CALCIUM: 9.2 mg/dL (ref 8.4–10.5)
CHLORIDE: 101 meq/L (ref 96–112)
CO2: 29 mEq/L (ref 19–32)
CREATININE: 2.2 mg/dL — AB (ref 0.50–1.10)
GFR, EST AFRICAN AMERICAN: 28 mL/min — AB (ref >90)
GFR, EST NON AFRICAN AMERICAN: 24 mL/min — AB (ref >90)
GLUCOSE: 310 mg/dL — AB (ref 70–99)
Phosphorus: 3.7 mg/dL (ref 2.3–4.6)
Potassium: 4 mEq/L (ref 3.7–5.3)
SODIUM: 142 meq/L (ref 137–147)

## 2013-03-12 LAB — MAGNESIUM: Magnesium: 2.2 mg/dL (ref 1.5–2.5)

## 2013-03-12 LAB — ANAEROBIC CULTURE

## 2013-03-12 MED ORDER — FUROSEMIDE 10 MG/ML IJ SOLN
10.0000 mg/h | INTRAMUSCULAR | Status: AC
  Administered 2013-03-12: 10 mg/h via INTRAVENOUS
  Filled 2013-03-12 (×2): qty 25

## 2013-03-12 MED ORDER — METOLAZONE 5 MG PO TABS
5.0000 mg | ORAL_TABLET | Freq: Once | ORAL | Status: AC
  Administered 2013-03-12: 5 mg via ORAL
  Filled 2013-03-12: qty 1

## 2013-03-12 MED ORDER — INSULIN GLARGINE 100 UNIT/ML ~~LOC~~ SOLN
40.0000 [IU] | Freq: Every day | SUBCUTANEOUS | Status: DC
  Administered 2013-03-12: 40 [IU] via SUBCUTANEOUS
  Filled 2013-03-12 (×2): qty 0.4

## 2013-03-12 NOTE — Progress Notes (Signed)
[facility]    Name: Stephanie Peck MRN: [id number] DOB: 11-Dec-1960     ADMISSION DATE: 03/05/2013  CONSULTATION DATE: 03/05/13  REFERRING MD : [facility]-->PCCM (2/5)   CHIEF COMPLAINT: Acute Respiratory Failure / Perirectal abscess & Sepsis   BRIEF PATIENT DESCRIPTION: [name] , with PMH of DM, GERD, Anxiety, COPD, Severe OSA, COPD, Morbid Obesity who presented to [facility] ER on 2/5 with a 4 day history of worsening left buttock abscess. CT of pelvis demonstrated multiple fluid dense areas within the subq fat of left buttock, stranding in the fat extending to anus compatible with cellulitis, and focal hemorrhage in left inguinal region, adjacent soft tissue mass.   SIGNIFICANT EVENTS / STUDIES:  2/5 - Admit with 4 day hx of worsening abscess on L buttock, fevers, chills. To OR per [facility]. Returned to ICU on vent.  2/5 - CT Pelvis: multiple fluid dense areas within the subq fat of left buttock, stranding in the fat extending to anus compatible with cellulitis, and focal hemorrhage in left inguinal region, adjacent soft tissue mass (?lymph node vs additional small hemorrhage). 2/6 2D echo>>> EF55-60%,  Mild AS, trivial pericardial effusion,   LINES / TUBES:  OETT 2/5>>> Rt IJ CVL 2/5>> R femoral vein cath 2/10>>  CULTURES:  BCx2 2/5>>> NGTD Wound Culture 2/5>>> EColi  Abscess culture (OR) 2/5>>>GNR>>>EColi  Flu 2/5>>> neg  ANTIBIOTICS:  Vanco 2/5>>> 2/7 Zosyn 2/5>>> 2/8 Cipro 2/8>>>  SUBJECTIVE: Intubated.   VITAL SIGNS: Temp:  [97.7 F (36.5 C)-98.1 F (36.7 C)] 98.1 F (36.7 C) (02/12 0015) Pulse Rate:  [87-123] 100 (02/12 0700) Resp:  [11-25] 17 (02/12 0700) BP: (112-168)/(40-73) 129/52 mmHg (02/12 0700) SpO2:  [88 %-100 %] 95 % (02/12 0700) FiO2 (%):  [40 %-50 %] 40 % (02/12 0600) Weight:  [270 lb 4.5 oz (122.6 kg)] 270 lb 4.5 oz (122.6 kg) (02/12 0256) HEMODYNAMICS: CVP:  [14 mmHg] 14 mmHg VENTILATOR SETTINGS: Vent Mode:  [-] PCV FiO2 (%):  [40 %-50 %] 40 % Set Rate:  [8 bmp] 8  bmp PEEP:  [5 cmH20] 5 cmH20 Pressure Support:  [8 cmH20] 8 cmH20 Plateau Pressure:  [20 cmH20-23 cmH20] 20 cmH20 INTAKE / OUTPUT: Intake/Output     02/11 0701 - 02/12 0700 02/12 0701 - 02/13 0700   I.V. (mL/kg) 685 (5.6)    NG/GT 1690    IV Piggyback 400    Total Intake(mL/kg) 2775 (22.6)    Urine (mL/kg/hr) 3060 (1)    Other 149 (0.1)    Total Output 3209     Net -434          Stool Occurrence 2 x     PHYSICAL EXAMINATION: General: Morbidly obese female in NAD on vent.  Neuro: sedated on vent, opens eyes to voice  HEENT: OETT, short / thick neck  Cardiovascular: s1s2 rrr, murmur appreciated, distant tones  Lungs: resp's even/non-labored on vent, very diminished bases. Coarse rhonchi Abdomen: Obese, soft, bs active  Skin: Warm/dry, L buttock dressing c/d/i Ext: warm, well perfused, no pitting edema bilateral ext.  LABS:  CBC  Recent Labs Lab 03/10/13 0730 03/11/13 0430 03/12/13 0300  WBC 15.4* 14.8* 15.1*  HGB 11.1* 11.0* 10.7*  HCT 34.6* 33.7* 32.5*  PLT 401* 439* 439*   Coag's  Recent Labs Lab 03/06/13 1052 03/09/13 0430 03/10/13 0553 03/11/13 0430  APTT 39* 51* 59* 52*  INR 1.28  --   --   --    BMET  Recent Labs Lab 03/10/13 1500  03/11/13 0430 03/12/13 0300  NA 138 139 142  K 4.7 4.1 4.0  CL 102 100 101  CO2 24 26 29   BUN 26* 22 37*  CREATININE 1.74* 1.39* 2.20*  GLUCOSE 316* 244* 310*   Electrolytes  Recent Labs Lab 03/10/13 1500 03/11/13 0430 03/12/13 0300  CALCIUM 8.2* 8.7 9.2  MG 2.4 2.4 2.2  PHOS 3.9  3.9 2.8 3.7   Sepsis Markers  Recent Labs Lab 03/05/13 1235 03/05/13 2248  LATICACIDVEN 1.6 1.3   ABG  Recent Labs Lab 03/09/13 1306 03/11/13 0420 03/12/13 0349  PHART 7.320* 7.365 7.354  PCO2ART 53.4* 48.4* 53.4*  PO2ART 62.0* 110.0* 77.6*   Liver Enzymes  Recent Labs Lab 03/05/13 1235  03/10/13 1500 03/11/13 0430 03/12/13 0300  AST 37  --   --   --   --   ALT 21  --   --   --   --   ALKPHOS 122*  --    --   --   --   BILITOT 0.3  --   --   --   --   ALBUMIN 2.4*  < > 1.7* 2.1* 2.1*  < > = values in this interval not displayed. Cardiac Enzymes  Recent Labs Lab 03/05/13 1455 03/05/13 2247 03/06/13 0433  TROPONINI <0.30 <0.30 <0.30  PROBNP 443.4*  --   --    Glucose  Recent Labs Lab 03/11/13 0755 03/11/13 1221 03/11/13 1532 03/11/13 1925 03/12/13 0014 03/12/13 0306  GLUCAP 254* 274* 312* 279* 279* 295*   Imaging Dg Chest Port 1 View  03/11/2013   CLINICAL DATA:  Pulmonary infiltrates.  EXAM: PORTABLE CHEST - 1 VIEW  COMPARISON:  03/10/2013 and 03/09/2013  FINDINGS: Endotracheal tube, central venous catheter, and NG tube appear in good position.  The bilateral pulmonary infiltrates appear slightly worse with persistent consolidation at the left lung base posterior medially. Heart size is normal.  IMPRESSION: Progressive bilateral pulmonary infiltrates.   Electronically Signed   By: Rozetta Nunnery M.D.   On: 03/11/2013 07:34   Dg Chest Port 1 View  03/10/2013   CLINICAL DATA:  Intubation.  EXAM: PORTABLE CHEST - 1 VIEW  COMPARISON:  03/09/2013  FINDINGS: Irregular interstitial densities and mild airspace opacity is similar to the prior exam suggesting mild residual pulmonary edema. There is more confluent lung base opacity, vertically on the left, also stable likely due to atelectasis. A small effusion on the left is suggested.  Right internal jugular central venous line, endotracheal tube and nasogastric tube are stable in well positioned.  IMPRESSION: No change from the previous day's study. Findings are consistent with mild persistent pulmonary edema as well as lung base atelectasis. Support apparatus is stable in well positioned.   Electronically Signed   By: Lajean Manes M.D.   On: 03/10/2013 12:00   ASSESSMENT / PLAN:  PULMONARY  A:  Acute Respiratory Failure  COPD  OSA  P:  - CXR: noted. - Minimize sedation as able. - Appears family has decided against trach and is  deferring to [place] as to when to extubate. - Lasix infusion for 24 hours, zaroxolyn 5 mg once: planned extubation tomorrow.  CARDIOVASCULAR  A:  CHF Hypotension - improved  Shock,  likely septic  P:  - Monitor CVP (14); goal 12.  - Hold home cozaar. - Off pressors.  RENAL  A:  Acute Renal failure - ATN likely - in setting of sepsis (CKD stage 3) Hyperkalemia improved Metabolic acidosis - improving  on CRT  UOP- good P:  - 3209/24 hr, positive >5L since admission - Cr elevating off lasix and off CRT, electrolytes stable.   - Hold nephrotoxic agents. - Lasix 40 mg IV Dc'd, Lasix infusion 10 mg/hr and zaroxolyn 5 mg once,  started in anticipation to extubate tomorrow  - Renal consulting.  GASTROINTESTINAL  A:  GERD  Hx Gastric Bypass  P:  - Protonix while intubated, not on PPI as outpt. - TF, continue  HEMATOLOGIC  A:  Chronic Anemia  coags ok  P:  - Hold PO iron and Aranesp. - SCD's / heparin for DVT.  INFECTIOUS  A:  Left Buttock Abscess / Perirectal Cellulitis- s/p I&D 2/5.  Ecoli in both cultures- both pan sens  P:  - Abx & cultures as above. - No immediate plans to return to OR at this time.  - Cipro 2/8, continue. - Surgery following.  ENDOCRINE  A:  Diabetes  Gout  P:  - SSI with resistant scale. - Lantus (30u-->40) adjusted for elevated CBG. - Hold januvia, janumet, indocin.  CBG (last 3)   Recent Labs  03/11/13 1925 03/12/13 0014 03/12/13 0306  GLUCAP 279* 279* 295*   NEUROLOGIC  A:  Pain  Acute Encephalopathy  Depression / Anxiety  P:  - Fentanyl gtt for pain (200 mcg)  - PRN versed for sedation needs  - Hold cymbalta, wellbutrin, neurontin, topamax   Kuneff, Renee DO PGY-2  Summary: Vent change to avoid dysynchrony, minimize sedation, LCB per niece (was DNR prior, now LCB with no CPR and no cardioversion), extensive discussion with family, re-discussed the entire case yesterday.  Per conversation between Son and Nurse last night,  family has decided not to go ahead with the trach.  CC time 35 min.  Patient seen and examined, agree with above note.  I dictated the care and orders written for this patient under my direction.  Rush Farmer, MD [phone]

## 2013-03-12 NOTE — Progress Notes (Signed)
Patient ID: Stephanie Peck, female   DOB: 08/30/1960, 53 y.o.   MRN: [id number]  Subjective: Pt intubated, arousable.    Objective:  Vital signs:  Filed Vitals:   03/12/13 0400 03/12/13 0429 03/12/13 0600 03/12/13 0700  BP:   154/60 129/52  Pulse: 91 110  100  Temp:      TempSrc:      Resp: 13  12 17   Height:      Weight:      SpO2: 100% 100% 98% 95%    Last BM Date: 03/11/13  Intake/Output   Yesterday:  02/11 0701 - 02/12 0700 In: 2775 [I.V.:685; WU/GQ:9169; IV Piggyback:400] Out: 3209 [Urine:3060] This shift:    I/O last 3 completed shifts: In: 4503 [I.V.:1105; NG/GT:2530; IV Piggyback:600] Out: 5358 [Urine:3435; UUEKC:0034]    Physical Exam: Wound: right perirectal wound is clean, beefy red with areas of fibrinous exudate.  Packing replaced.   Problem List:   Active Problems:   OBESITY   Sepsis   CHF, acute on chronic   DM type 2 (diabetes mellitus, type 2)   Gluteal abscess   Acute renal failure    Results:   Labs: Results for orders placed during the hospital encounter of 03/05/13 (from the past 48 hour(s))  POCT ACTIVATED CLOTTING TIME     Status: None   Collection Time    03/10/13  8:56 AM      Result Value Ref Range   Activated Clotting Time 193    GLUCOSE, CAPILLARY     Status: Abnormal   Collection Time    03/10/13 11:40 AM      Result Value Ref Range   Glucose-Capillary 272 (*) 70 - 99 mg/dL  POCT ACTIVATED CLOTTING TIME     Status: None   Collection Time    03/10/13 12:57 PM      Result Value Ref Range   Activated Clotting Time 182    POCT ACTIVATED CLOTTING TIME     Status: None   Collection Time    03/10/13  2:08 PM      Result Value Ref Range   Activated Clotting Time 193    RENAL FUNCTION PANEL     Status: Abnormal   Collection Time    03/10/13  3:00 PM      Result Value Ref Range   Sodium 138  137 - 147 mEq/L   Potassium 4.7  3.7 - 5.3 mEq/L   Chloride 102  96 - 112 mEq/L   CO2 24  19 - 32 mEq/L   Glucose, Bld 316 (*) 70  - 99 mg/dL   BUN 26 (*) 6 - 23 mg/dL   Creatinine, Ser 1.74 (*) 0.50 - 1.10 mg/dL   Calcium 8.2 (*) 8.4 - 10.5 mg/dL   Phosphorus 3.9  2.3 - 4.6 mg/dL   Albumin 1.7 (*) 3.5 - 5.2 g/dL   GFR calc non Af Amer 32 (*) >90 mL/min   GFR calc Af Amer 37 (*) >90 mL/min   Comment: (NOTE)     The eGFR has been calculated using the CKD EPI equation.     This calculation has not been validated in all clinical situations.     eGFR's persistently <90 mL/min signify possible Chronic Kidney     Disease.  PHOSPHORUS     Status: None   Collection Time    03/10/13  3:00 PM      Result Value Ref Range   Phosphorus 3.9  2.3 - 4.6 mg/dL  MAGNESIUM     Status: None   Collection Time    03/10/13  3:00 PM      Result Value Ref Range   Magnesium 2.4  1.5 - 2.5 mg/dL  POCT ACTIVATED CLOTTING TIME     Status: None   Collection Time    03/10/13  3:16 PM      Result Value Ref Range   Activated Clotting Time 193    GLUCOSE, CAPILLARY     Status: Abnormal   Collection Time    03/10/13  3:29 PM      Result Value Ref Range   Glucose-Capillary 273 (*) 70 - 99 mg/dL  POCT ACTIVATED CLOTTING TIME     Status: None   Collection Time    03/10/13  4:16 PM      Result Value Ref Range   Activated Clotting Time 188    POCT ACTIVATED CLOTTING TIME     Status: None   Collection Time    03/10/13  6:08 PM      Result Value Ref Range   Activated Clotting Time 188    GLUCOSE, CAPILLARY     Status: Abnormal   Collection Time    03/10/13  6:59 PM      Result Value Ref Range   Glucose-Capillary 232 (*) 70 - 99 mg/dL  POCT ACTIVATED CLOTTING TIME     Status: None   Collection Time    03/10/13  8:03 PM      Result Value Ref Range   Activated Clotting Time 188    GLUCOSE, CAPILLARY     Status: Abnormal   Collection Time    03/11/13 12:18 AM      Result Value Ref Range   Glucose-Capillary 237 (*) 70 - 99 mg/dL   Comment 1 Notify RN    POCT ACTIVATED CLOTTING TIME     Status: None   Collection Time    03/11/13   3:48 AM      Result Value Ref Range   Activated Clotting Time 171    POCT I-STAT 3, BLOOD GAS (G3+)     Status: Abnormal   Collection Time    03/11/13  4:20 AM      Result Value Ref Range   pH, Arterial 7.365  7.350 - 7.450   pCO2 arterial 48.4 (*) 35.0 - 45.0 mmHg   pO2, Arterial 110.0 (*) 80.0 - 100.0 mmHg   Bicarbonate 27.7 (*) 20.0 - 24.0 mEq/L   TCO2 29  0 - 100 mmol/L   O2 Saturation 98.0     Acid-Base Excess 2.0  0.0 - 2.0 mmol/L   Patient temperature 98.6 F     Collection site RADIAL, ALLEN'S TEST ACCEPTABLE     Drawn by Operator     Sample type ARTERIAL    GLUCOSE, CAPILLARY     Status: Abnormal   Collection Time    03/11/13  4:25 AM      Result Value Ref Range   Glucose-Capillary 218 (*) 70 - 99 mg/dL   Comment 1 Notify RN    RENAL FUNCTION PANEL     Status: Abnormal   Collection Time    03/11/13  4:30 AM      Result Value Ref Range   Sodium 139  137 - 147 mEq/L   Potassium 4.1  3.7 - 5.3 mEq/L   Chloride 100  96 - 112 mEq/L   CO2 26  19 - 32 mEq/L   Glucose, Bld 244 (*)  70 - 99 mg/dL   BUN 22  6 - 23 mg/dL   Creatinine, Ser 1.39 (*) 0.50 - 1.10 mg/dL   Calcium 8.7  8.4 - 10.5 mg/dL   Phosphorus 2.8  2.3 - 4.6 mg/dL   Albumin 2.1 (*) 3.5 - 5.2 g/dL   GFR calc non Af Amer 42 (*) >90 mL/min   GFR calc Af Amer 49 (*) >90 mL/min   Comment: (NOTE)     The eGFR has been calculated using the CKD EPI equation.     This calculation has not been validated in all clinical situations.     eGFR's persistently <90 mL/min signify possible Chronic Kidney     Disease.  CBC     Status: Abnormal   Collection Time    03/11/13  4:30 AM      Result Value Ref Range   WBC 14.8 (*) 4.0 - 10.5 K/uL   RBC 3.39 (*) 3.87 - 5.11 MIL/uL   Hemoglobin 11.0 (*) 12.0 - 15.0 g/dL   HCT 33.7 (*) 36.0 - 46.0 %   MCV 99.4  78.0 - 100.0 fL   MCH 32.4  26.0 - 34.0 pg   MCHC 32.6  30.0 - 36.0 g/dL   RDW 13.6  11.5 - 15.5 %   Platelets 439 (*) 150 - 400 K/uL  DIFFERENTIAL     Status:  Abnormal   Collection Time    03/11/13  4:30 AM      Result Value Ref Range   Neutrophils Relative % 80 (*) 43 - 77 %   Lymphocytes Relative 14  12 - 46 %   Monocytes Relative 5  3 - 12 %   Eosinophils Relative 1  0 - 5 %   Basophils Relative 0  0 - 1 %   Neutro Abs 11.9 (*) 1.7 - 7.7 K/uL   Lymphs Abs 2.1  0.7 - 4.0 K/uL   Monocytes Absolute 0.7  0.1 - 1.0 K/uL   Eosinophils Absolute 0.1  0.0 - 0.7 K/uL   Basophils Absolute 0.0  0.0 - 0.1 K/uL   WBC Morphology MILD LEFT SHIFT (1-5% METAS, OCC MYELO, OCC BANDS)     Comment: TOXIC GRANULATION  MAGNESIUM     Status: None   Collection Time    03/11/13  4:30 AM      Result Value Ref Range   Magnesium 2.4  1.5 - 2.5 mg/dL  APTT     Status: Abnormal   Collection Time    03/11/13  4:30 AM      Result Value Ref Range   aPTT 52 (*) 24 - 37 seconds   Comment:            IF BASELINE aPTT IS ELEVATED,     SUGGEST PATIENT RISK ASSESSMENT     BE USED TO DETERMINE APPROPRIATE     ANTICOAGULANT THERAPY.  POCT ACTIVATED CLOTTING TIME     Status: None   Collection Time    03/11/13  5:27 AM      Result Value Ref Range   Activated Clotting Time 188    GLUCOSE, CAPILLARY     Status: Abnormal   Collection Time    03/11/13  7:55 AM      Result Value Ref Range   Glucose-Capillary 254 (*) 70 - 99 mg/dL  GLUCOSE, CAPILLARY     Status: Abnormal   Collection Time    03/11/13 12:21 PM      Result Value Ref Range  Glucose-Capillary 274 (*) 70 - 99 mg/dL  GLUCOSE, CAPILLARY     Status: Abnormal   Collection Time    03/11/13  3:32 PM      Result Value Ref Range   Glucose-Capillary 312 (*) 70 - 99 mg/dL  GLUCOSE, CAPILLARY     Status: Abnormal   Collection Time    03/11/13  7:25 PM      Result Value Ref Range   Glucose-Capillary 279 (*) 70 - 99 mg/dL  GLUCOSE, CAPILLARY     Status: Abnormal   Collection Time    03/12/13 12:14 AM      Result Value Ref Range   Glucose-Capillary 279 (*) 70 - 99 mg/dL   Comment 1 Notify RN     Comment 2  Documented in Chart    RENAL FUNCTION PANEL     Status: Abnormal   Collection Time    03/12/13  3:00 AM      Result Value Ref Range   Sodium 142  137 - 147 mEq/L   Potassium 4.0  3.7 - 5.3 mEq/L   Chloride 101  96 - 112 mEq/L   CO2 29  19 - 32 mEq/L   Glucose, Bld 310 (*) 70 - 99 mg/dL   BUN 37 (*) 6 - 23 mg/dL   Comment: DELTA CHECK NOTED   Creatinine, Ser 2.20 (*) 0.50 - 1.10 mg/dL   Comment: DELTA CHECK NOTED   Calcium 9.2  8.4 - 10.5 mg/dL   Phosphorus 3.7  2.3 - 4.6 mg/dL   Albumin 2.1 (*) 3.5 - 5.2 g/dL   GFR calc non Af Amer 24 (*) >90 mL/min   GFR calc Af Amer 28 (*) >90 mL/min   Comment: (NOTE)     The eGFR has been calculated using the CKD EPI equation.     This calculation has not been validated in all clinical situations.     eGFR's persistently <90 mL/min signify possible Chronic Kidney     Disease.  CBC WITH DIFFERENTIAL     Status: Abnormal   Collection Time    03/12/13  3:00 AM      Result Value Ref Range   WBC 15.1 (*) 4.0 - 10.5 K/uL   RBC 3.28 (*) 3.87 - 5.11 MIL/uL   Hemoglobin 10.7 (*) 12.0 - 15.0 g/dL   HCT 32.5 (*) 36.0 - 46.0 %   MCV 99.1  78.0 - 100.0 fL   MCH 32.6  26.0 - 34.0 pg   MCHC 32.9  30.0 - 36.0 g/dL   RDW 13.4  11.5 - 15.5 %   Platelets 439 (*) 150 - 400 K/uL   Neutrophils Relative % 78 (*) 43 - 77 %   Neutro Abs 11.8 (*) 1.7 - 7.7 K/uL   Lymphocytes Relative 16  12 - 46 %   Lymphs Abs 2.4  0.7 - 4.0 K/uL   Monocytes Relative 5  3 - 12 %   Monocytes Absolute 0.7  0.1 - 1.0 K/uL   Eosinophils Relative 1  0 - 5 %   Eosinophils Absolute 0.2  0.0 - 0.7 K/uL   Basophils Relative 0  0 - 1 %   Basophils Absolute 0.1  0.0 - 0.1 K/uL  MAGNESIUM     Status: None   Collection Time    03/12/13  3:00 AM      Result Value Ref Range   Magnesium 2.2  1.5 - 2.5 mg/dL  GLUCOSE, CAPILLARY     Status: Abnormal  Collection Time    03/12/13  3:06 AM      Result Value Ref Range   Glucose-Capillary 295 (*) 70 - 99 mg/dL  BLOOD GAS, ARTERIAL      Status: Abnormal   Collection Time    03/12/13  3:49 AM      Result Value Ref Range   FIO2 0.40     Delivery systems VENTILATOR     Mode PRESSURE CONTROL     Rate 8     Peep/cpap 5.0     Pressure control 20     pH, Arterial 7.354  7.350 - 7.450   pCO2 arterial 53.4 (*) 35.0 - 45.0 mmHg   pO2, Arterial 77.6 (*) 80.0 - 100.0 mmHg   Bicarbonate 29.1 (*) 20.0 - 24.0 mEq/L   TCO2 30.8  0 - 100 mmol/L   Acid-Base Excess 3.9 (*) 0.0 - 2.0 mmol/L   O2 Saturation 94.6     Patient temperature 98.1     Collection site RIGHT RADIAL     Drawn by [phone]     Sample type ARTERIAL DRAW     Allens test (pass/fail) PASS  PASS    Imaging / Studies: Dg Chest Port 1 View  03/12/2013   CLINICAL DATA:  Evaluate endotracheal tube positioning  EXAM: PORTABLE CHEST - 1 VIEW  COMPARISON:  DG CHEST 1V PORT dated 03/11/2013; DG CHEST 1V PORT dated 03/05/2013; DG CHEST 1V PORT dated 03/05/2013; DG CHEST 1V PORT dated 02/22/2012; DG CHEST 2 VIEW dated 01/07/2012  FINDINGS: Grossly unchanged enlarged cardiac silhouette and mediastinal contours given decreased lung volumes and patient rotation. Stable position of support apparatus. The pulmonary vasculature remains indistinct with cephalization of flow. Grossly unchanged trace/small bilateral effusions and associated bibasilar heterogeneous/consolidative opacities, left greater than right. Grossly unchanged bones.  IMPRESSION: 1.  Stable positioning of support apparatus.  No pneumothorax. 2. Grossly unchanged findings most suggestive of mildly asymmetric pulmonary edema and bibasilar atelectasis, though underlying infection is not excluded.   Electronically Signed   By: Sandi Mariscal M.D.   On: 03/12/2013 07:55   Dg Chest Port 1 View  03/11/2013   CLINICAL DATA:  Pulmonary infiltrates.  EXAM: PORTABLE CHEST - 1 VIEW  COMPARISON:  03/10/2013 and 03/09/2013  FINDINGS: Endotracheal tube, central venous catheter, and NG tube appear in good position.  The bilateral pulmonary  infiltrates appear slightly worse with persistent consolidation at the left lung base posterior medially. Heart size is normal.  IMPRESSION: Progressive bilateral pulmonary infiltrates.   Electronically Signed   By: Rozetta Nunnery M.D.   On: 03/11/2013 07:34   Dg Chest Port 1 View  03/10/2013   CLINICAL DATA:  Intubation.  EXAM: PORTABLE CHEST - 1 VIEW  COMPARISON:  03/09/2013  FINDINGS: Irregular interstitial densities and mild airspace opacity is similar to the prior exam suggesting mild residual pulmonary edema. There is more confluent lung base opacity, vertically on the left, also stable likely due to atelectasis. A small effusion on the left is suggested.  Right internal jugular central venous line, endotracheal tube and nasogastric tube are stable in well positioned.  IMPRESSION: No change from the previous day's study. Findings are consistent with mild persistent pulmonary edema as well as lung base atelectasis. Support apparatus is stable in well positioned.   Electronically Signed   By: Lajean Manes M.D.   On: 03/10/2013 12:00    Medications / Allergies: per chart  Antibiotics: Anti-infectives   Start     Dose/Rate Route Frequency  Ordered Stop   03/09/13 1400  vancomycin (VANCOCIN) 1,750 mg in sodium chloride 0.9 % 500 mL IVPB  Status:  Discontinued     1,750 mg 250 mL/hr over 120 Minutes Intravenous Every 48 hours 03/07/13 1050 03/08/13 1050   03/08/13 1200  ciprofloxacin (CIPRO) IVPB 400 mg     400 mg 200 mL/hr over 60 Minutes Intravenous Every 12 hours 03/08/13 1133     03/06/13 1400  vancomycin (VANCOCIN) 1,750 mg in sodium chloride 0.9 % 500 mL IVPB     1,750 mg 250 mL/hr over 120 Minutes Intravenous Every 24 hours 03/05/13 1650 03/07/13 1511   03/05/13 2030  piperacillin-tazobactam (ZOSYN) IVPB 3.375 g  Status:  Discontinued     3.375 g 12.5 mL/hr over 240 Minutes Intravenous 3 times per day 03/05/13 1637 03/08/13 1050   03/05/13 1700  vancomycin (VANCOCIN) IVPB 1000 mg/200 mL  premix     1,000 mg 200 mL/hr over 60 Minutes Intravenous NOW 03/05/13 1650 03/05/13 2027   03/05/13 1200  vancomycin (VANCOCIN) IVPB 1000 mg/200 mL premix     1,000 mg 200 mL/hr over 60 Minutes Intravenous  Once 03/05/13 1148 03/05/13 1453   03/05/13 1200  piperacillin-tazobactam (ZOSYN) IVPB 3.375 g     3.375 g 100 mL/hr over 30 Minutes Intravenous  Once 03/05/13 1148 03/05/13 1348       Assessment/Plan Gluteal abscess s/p I&D  POD#6 Continue BID dressing changes until the patient is made comfort care. Antibiotics per primary team based on goals of care.   Erby Pian, [facility] Surgery Pager [phone] Office [phone]  03/12/2013 8:03 AM

## 2013-03-12 NOTE — Consult Note (Signed)
WOC consulted for wound and blisters, however noted after discussion with bedside nurse that CCS is managing this wound.  WOC will not consult for this reason.     Re consult if needed, will not follow at this time. Thanks  Stephanie Peck, [place] [phone])

## 2013-03-12 NOTE — Progress Notes (Signed)
I D/W Dr. Nelda Marseille. Noted plans for withdrawal of aggressive measures. Patient examined and I agree with the assessment and plan  Georganna Skeans, MD, MPH, FACS Pager: [phone]  03/12/2013 1:30 PM

## 2013-03-12 NOTE — Progress Notes (Signed)
Admit: 03/05/2013 LOS: 7  Stephanie Peck with AKI in setting of CKD3 (BL 1.2-1.8) 2/2 ATN in setting of hypoxic/hypercapneic VDRF, necrotizing L buttock skin infection, and septic shock.    Subjective:  Doing OK off CRRT- good UOP- creatinine up slightly not surprising.  Noted that family does not want trach- trying to maximize situation so that extubation can be successful   02/11 0701 - 02/12 0700 In: 2775 [I.V.:685; NG/GT:1690; IV Piggyback:400] Out: 3209 [Urine:3060]  Filed Weights   03/10/13 0500 03/11/13 0424 03/12/13 0256  Weight: 126 kg (277 lb 12.5 oz) 122.1 kg (269 lb 2.9 oz) 122.6 kg (270 lb 4.5 oz)    Current meds: reviewed  Current Labs: reviewed    Physical Exam:  Blood pressure 129/52, pulse 100, temperature 98.1 F (36.7 C), temperature source Oral, resp. rate 17, height 5\' 7"  (1.702 m), weight 122.6 kg (270 lb 4.5 oz), SpO2 95.00%. GEN: obese, intubated, more alert ENT: hirsuit, ETT in place  CV: RRR, nl s1s2  PULM: coarse bs b/l  ABD: s/nt/nd, obese  SKIN: no rashes/lesions  GU: foley catheter  PSYCH: unable to assess  EXT:1+ LEE   Assessment  Stephanie Peck with AKI in setting of CKD3 (BL 1.2-1.8) 2/2 ATN in setting of hypoxic/hypercapneic VDRF, necrotizing L buttock skin infection, and septic shock.  1. AKI 2/2 ATN, anuric 2/2 sepsis + ?TMP/SMX, ARB, NSAID 2. Inc AG Metabolic Acidosis + Resp Acidosis (failure to compensate) 3. CKD3 4. Septic Shock 2/2 polymicrobial / E.coli necrotizing skin infection on Vanc/Zosyn 5. Hypoxic and Hypercapneic VDRF 6. Morbid Obesity 7. Uncontrolled DM2, admit A1c 11.3% PLAN  Still making good urine- 3 liters out  Metabolic acidosis resolved. No indications for further dialysis at present. CRRT stopped on 2/11.  Gave her lasix but CCM wants a lasix drip to maximize volume status 1. Will follow closely 2. Electrolytes are stable  3. No NSAIDS, judicious IV contrast 4. ABX and VDRF  per CCM- WBC stable- now on cipro 5. Anemia-  multifactorial- added aranesp- transfuse as needed - stable for now  Akshaj Besancon A  03/12/2013, 8:03 AM   Recent Labs Lab 03/10/13 1500 03/11/13 0430 03/12/13 0300  NA 138 139 142  K 4.7 4.1 4.0  CL 102 100 101  CO2 24 26 29   GLUCOSE 316* 244* 310*  BUN 26* 22 37*  CREATININE 1.74* 1.39* 2.20*  CALCIUM 8.2* 8.7 9.2  PHOS 3.9  3.9 2.8 3.7    Recent Labs Lab 03/10/13 0730 03/11/13 0430 03/12/13 0300  WBC 15.4* 14.8* 15.1*  NEUTROABS 12.7* 11.9* 11.8*  HGB 11.1* 11.0* 10.7*  HCT 34.6* 33.7* 32.5*  MCV 100.0 99.4 99.1  PLT 401* 439* 439*

## 2013-03-13 LAB — GLUCOSE, CAPILLARY
GLUCOSE-CAPILLARY: 355 mg/dL — AB (ref 70–99)
GLUCOSE-CAPILLARY: 212 mg/dL — AB (ref 70–99)
Glucose-Capillary: 272 mg/dL — ABNORMAL HIGH (ref 70–99)
Glucose-Capillary: 291 mg/dL — ABNORMAL HIGH (ref 70–99)
Glucose-Capillary: 321 mg/dL — ABNORMAL HIGH (ref 70–99)
Glucose-Capillary: 328 mg/dL — ABNORMAL HIGH (ref 70–99)

## 2013-03-13 LAB — RENAL FUNCTION PANEL
Albumin: 2.3 g/dL — ABNORMAL LOW (ref 3.5–5.2)
BUN: 53 mg/dL — AB (ref 6–23)
CHLORIDE: 93 meq/L — AB (ref 96–112)
CO2: 32 meq/L (ref 19–32)
CREATININE: 2.58 mg/dL — AB (ref 0.50–1.10)
Calcium: 10.1 mg/dL (ref 8.4–10.5)
GFR calc Af Amer: 23 mL/min — ABNORMAL LOW (ref >90)
GFR calc non Af Amer: 20 mL/min — ABNORMAL LOW (ref >90)
GLUCOSE: 377 mg/dL — AB (ref 70–99)
POTASSIUM: 3.9 meq/L (ref 3.7–5.3)
Phosphorus: 5.4 mg/dL — ABNORMAL HIGH (ref 2.3–4.6)
Sodium: 140 mEq/L (ref 137–147)

## 2013-03-13 LAB — CBC WITH DIFFERENTIAL/PLATELET
Basophils Absolute: 0.1 10*3/uL (ref 0.0–0.1)
Basophils Relative: 0 % (ref 0–1)
EOS ABS: 0.2 10*3/uL (ref 0.0–0.7)
EOS PCT: 1 % (ref 0–5)
HEMATOCRIT: 35.4 % — AB (ref 36.0–46.0)
HEMOGLOBIN: 11.6 g/dL — AB (ref 12.0–15.0)
LYMPHS ABS: 2.6 10*3/uL (ref 0.7–4.0)
Lymphocytes Relative: 14 % (ref 12–46)
MCH: 32.5 pg (ref 26.0–34.0)
MCHC: 32.8 g/dL (ref 30.0–36.0)
MCV: 99.2 fL (ref 78.0–100.0)
MONO ABS: 1.1 10*3/uL — AB (ref 0.1–1.0)
MONOS PCT: 6 % (ref 3–12)
Neutro Abs: 14.3 10*3/uL — ABNORMAL HIGH (ref 1.7–7.7)
Neutrophils Relative %: 79 % — ABNORMAL HIGH (ref 43–77)
Platelets: 554 10*3/uL — ABNORMAL HIGH (ref 150–400)
RBC: 3.57 MIL/uL — AB (ref 3.87–5.11)
RDW: 13.4 % (ref 11.5–15.5)
WBC: 18.2 10*3/uL — AB (ref 4.0–10.5)

## 2013-03-13 LAB — MAGNESIUM: Magnesium: 1.9 mg/dL (ref 1.5–2.5)

## 2013-03-13 MED ORDER — INSULIN GLARGINE 100 UNIT/ML ~~LOC~~ SOLN
20.0000 [IU] | Freq: Every day | SUBCUTANEOUS | Status: DC
  Administered 2013-03-13: 20 [IU] via SUBCUTANEOUS
  Filled 2013-03-13 (×2): qty 0.2

## 2013-03-13 MED ORDER — PANTOPRAZOLE SODIUM 40 MG IV SOLR
40.0000 mg | Freq: Every day | INTRAVENOUS | Status: DC
  Administered 2013-03-13: 40 mg via INTRAVENOUS
  Filled 2013-03-13 (×2): qty 40

## 2013-03-13 MED ORDER — ACETAZOLAMIDE SODIUM 500 MG IJ SOLR
250.0000 mg | Freq: Three times a day (TID) | INTRAMUSCULAR | Status: AC
  Administered 2013-03-13 (×2): 250 mg via INTRAVENOUS
  Filled 2013-03-13 (×2): qty 500

## 2013-03-13 MED ORDER — FUROSEMIDE 10 MG/ML IJ SOLN
40.0000 mg | Freq: Three times a day (TID) | INTRAMUSCULAR | Status: AC
  Administered 2013-03-13 (×2): 40 mg via INTRAVENOUS
  Filled 2013-03-13: qty 4

## 2013-03-13 MED ORDER — POTASSIUM CHLORIDE 20 MEQ/15ML (10%) PO LIQD
40.0000 meq | Freq: Once | ORAL | Status: AC
  Administered 2013-03-13: 40 meq
  Filled 2013-03-13: qty 30

## 2013-03-13 NOTE — Progress Notes (Signed)
NUTRITION FOLLOW UP  Intervention:    Continue TF via OGT while intubated with Vital High Protein at 25 ml/h and Prostat 30 ml once daily on day 1; on day 2, increase to goal rate of 65 ml/h (1560 ml per day) to provide 1660 kcals (72% of estimated needs), 152 gm protein, 1304 ml free water daily.  Diet advancement as able post extubation pending goals of care.  Nutrition Dx:   Inadequate oral intake related to inability to eat as evidenced by NPO status. Ongoing.  Goal:   Enteral nutrition to provide 60-70% of estimated calorie needs (22-25 kcals/kg ideal body weight) and 100% of estimated protein needs, based on ASPEN guidelines for permissive underfeeding in critically ill obese individuals. Met.  Monitor:   TF tolerance/adequacy, weight trend, labs, vent status.  Assessment:   [name] with PMH of DM, GERD, Anxiety, COPD, Severe OSA, COPD, Morbid Obesity who presented to [facility] ER on 2/5 with a 4 day history of worsening left buttock abscess. CT of pelvis demonstrated multiple fluid dense areas within the subq fat of left buttock, stranding in the fat extending to anus compatible with cellulitis, and focal hemorrhage in left inguinal region, adjacent soft tissue mass.  S/P I&D of perirectal abscess on 2/5. Patient started on CRRT on 2/8, stopped 2/11. Per discussion with Physicians, plans to extubate patient today after family arrives with no plans for re-intubation. She will lose her OGT with extubation, so TF will be discontinued.  Patient is currently intubated on ventilator support.  MV: 10.1 L/min Temp (24hrs), Avg:97.7 F (36.5 C), Min:97 F (36.1 C), Max:98.4 F (36.9 C)   Height: Ht Readings from Last 1 Encounters:  03/05/13 5' 7"  (1.702 m)    Weight Status:   Wt Readings from Last 1 Encounters:  03/13/13 256 lb 6.3 oz (116.3 kg)  03/09/13  280 lb 13.9 oz (127.4 kg)  03/06/13  279 lb 1.6 oz (126.6 kg)    Body mass index is 40.15 kg/(m^2).   Re-estimated needs:   Kcal: 1900-2100 Protein: 120-130 gm Fluid: 2-2.2 L  Skin: 2 necrotic lesions on left buttocks, S/P I&D  Diet Order:  NPO   Intake/Output Summary (Last 24 hours) at 03/13/13 0950 Last data filed at 03/13/13 0900  Gross per 24 hour  Intake   3380 ml  Output   5941 ml  Net  -2561 ml    Last BM: 2/13   Labs:   Recent Labs Lab 03/11/13 0430 03/12/13 0300 03/13/13 0500  NA 139 142 140  K 4.1 4.0 3.9  CL 100 101 93*  CO2 26 29 32  BUN 22 37* 53*  CREATININE 1.39* 2.20* 2.58*  CALCIUM 8.7 9.2 10.1  MG 2.4 2.2 1.9  PHOS 2.8 3.7 5.4*  GLUCOSE 244* 310* 377*    CBG (last 3)   Recent Labs  03/13/13 0007 03/13/13 0352 03/13/13 0826  GLUCAP 291* 321* 355*    Scheduled Meds: . antiseptic oral rinse  15 mL Mouth Rinse QID  . chlorhexidine  15 mL Mouth Rinse BID  . ciprofloxacin  400 mg Intravenous Q12H  . clonazePAM  2 mg Oral BID  . feeding supplement (PRO-STAT SUGAR FREE 64)  30 mL Per Tube Daily  . feeding supplement (VITAL HIGH PROTEIN)  1,000 mL Per Tube Q24H  . insulin aspart  0-20 Units Subcutaneous 6 times per day  . insulin glargine  40 Units Subcutaneous QHS  . pantoprazole sodium  40 mg Per Tube  QHS  . sodium chloride  3 mL Intravenous Q12H    Continuous Infusions: . sodium chloride 10 mL/hr at 03/09/13 1235  . fentaNYL infusion INTRAVENOUS 300 mcg/hr (03/13/13 0206)  . norepinephrine (LEVOPHED) Adult infusion Stopped (03/07/13 0758)    Molli Barrows, RD, LDN, [place] Pager [phone] After Hours Pager [phone]

## 2013-03-13 NOTE — Progress Notes (Signed)
PULMONARY / CRITICAL CARE MEDICINE  Name: Stephanie Peck MRN: [id number] DOB: [birth year]    ADMISSION DATE: 03/05/2013  CONSULTATION DATE: 03/05/2013   REFERRING MD : TRH  CHIEF COMPLAINT: Acute respiratory distress  BRIEF PATIENT DESCRIPTION: 53 y/o morbidly obese with OSA, COPDand DM admitted 2/5 with left buttock abscess.  SIGNIFICANT EVENTS / STUDIES:  2/5  CT Pelvis >>> Multiple fluid dense areas within the subcutaneous fat of left buttock, stranding in the fat extending to anus compatible with cellulitis, and focal hemorrhage in left inguinal region, adjacent soft tissue mass vs lymph node vs small hemorrhage 2/5  OR >>> I&D 2/6 TTE >>> EF 55-60%,  mild AS, trivial pericardial effusion  LINES / TUBES:  OETT 2/5 >>> 2/13 R IJ CVL 2/5 >>>  R fem  HD cath 2/10 >>>  CULTURES:  2/5  Blood >>> neg 2/5  Wound >>> E.COLI 2/5  Flu PCR >>> neg  ANTIBIOTICS:  Vancomycin 2/5 >>> 2/7 Zosyn 2/5 >>> 2/8 Cipro 2/8 >>>  INTERVAL HISTORY:  No overnight events  VITAL SIGNS: Temp:  [97.9 F (36.6 C)-98.5 F (36.9 C)] 98 F (36.7 C) (02/14 0829) Pulse Rate:  [93-118] 102 (02/14 1000) Resp:  [11-26] 16 (02/14 1000) BP: (109-181)/(55-97) 130/58 mmHg (02/14 1000) SpO2:  [90 %-98 %] 91 % (02/14 1000) Weight:  [113.1 kg (249 lb 5.4 oz)] 113.1 kg (249 lb 5.4 oz) (02/14 0434)  HEMODYNAMICS: CVP:  [0 mmHg-3 mmHg] 1 mmHg  VENTILATOR SETTINGS:    INTAKE / OUTPUT: Intake/Output     02/13 0701 - 02/14 0700 02/14 0701 - 02/15 0700   I.V. (mL/kg) 387.7 (3.4) 30 (0.3)   NG/GT 780    IV Piggyback 400    Total Intake(mL/kg) 1567.7 (13.9) 30 (0.3)   Urine (mL/kg/hr) 4845 (1.8) 200 (0.4)   Stool 1 (0)    Total Output 4846 200   Net -3278.3 -170         PHYSICAL EXAMINATION: General:  Appears ill, morbidly obese Neuro:  Obtunded, but moves spontaneously  HEENT:  PERRL Cardiovascular:  RRR, no m/r/g Lungs:  Bilateral diminished air entry, few rhonchi Abdomen:  Soft, nontender, bowel  sounds diminished Musculoskeletal:  Moves all extremities, bilateral pitting edema Skin:  Surgical dressing intact  LABS:  CBC  Recent Labs Lab 03/12/13 0300 03/13/13 0500 03/14/13 0400  WBC 15.1* 18.2* 16.8*  HGB 10.7* 11.6* 12.4  HCT 32.5* 35.4* 38.2  PLT 439* 554* 602*   Coag's  Recent Labs Lab 03/09/13 0430 03/10/13 0553 03/11/13 0430  APTT 51* 59* 52*   BMET  Recent Labs Lab 03/12/13 0300 03/13/13 0500 03/14/13 0400  NA 142 140 142  143  K 4.0 3.9 4.1  4.0  CL 101 93* 94*  95*  CO2 29 32 34*  34*  BUN 37* 53* 67*  68*  CREATININE 2.20* 2.58* 2.88*  2.87*  GLUCOSE 310* 377* 333*  332*   Electrolytes  Recent Labs Lab 03/12/13 0300 03/13/13 0500 03/14/13 0400  CALCIUM 9.2 10.1 10.3  10.3  MG 2.2 1.9 2.1  PHOS 3.7 5.4* 4.8*  4.8*   Sepsis Markers No results found for this basename: LATICACIDVEN, PROCALCITON, O2SATVEN,  in the last 168 hours  ABG  Recent Labs Lab 03/09/13 1306 03/11/13 0420 03/12/13 0349  PHART 7.320* 7.365 7.354  PCO2ART 53.4* 48.4* 53.4*  PO2ART 62.0* 110.0* 77.6*   Liver Enzymes  Recent Labs Lab 03/12/13 0300 03/13/13 0500 03/14/13 0400  ALBUMIN 2.1* 2.3* 2.6*  Cardiac Enzymes No results found for this basename: TROPONINI, PROBNP,  in the last 168 hours  Glucose  Recent Labs Lab 03/13/13 0826 03/13/13 1155 03/13/13 1557 03/13/13 1920 03/14/13 0031 03/14/13 0728  GLUCAP 355* 328* 272* 212* 298* 251*   CXR: None today  ASSESSMENT / PLAN:  PULMONARY  A:  Acute respiratory failure, resolved COPD  OSA / OHS P:  Goal SpO2>92 Albuterol PRN Need goals of care meeting as not clear if should re-intubate if clinically indicated  CARDIOVASCULAR  A:  Acute diastolic congestive heart failure Sepsis, improving P:  Goal MAP > 65 Hold Cozaar D/c Levophed DNR  RENAL  A:  AKI P:  Renal following Trend BMP D/c Diamox May need HD in few days per Renal, will discuss with family if  appropriate  GASTROINTESTINAL  A:  GERD  Hx gastric bypass  GI Px is not indicated P:  D/c Protonix If full care is desired needs NGT  HEMATOLOGIC  A:  Chronic anemia  VTE Px P:  Trend CBC SCDs  INFECTIOUS  A:  Left buttock abscess, s/p I&D 2/5 Perirectal cellulitis P:  Cx / abx as above  ENDOCRINE  A:  Diabetes  Hyperglycemia P:  SSI, resistant scale. Lantus 20, would not increase as now NPO Hold Januvia, Janumet  NEUROLOGIC  A:  Pain  Acute encephalopathy  Depression / anxiety  P:  D/c Fentanyl gtt Fentanyl PRN D/c Versed Hold Cymbalta, Wellbutrin, Neurontin, Topamax   I have personally obtained history, examined patient, evaluated and interpreted laboratory and imaging results, reviewed medical records, formulated assessment / plan and placed orders.  Doree Fudge, MD Pulmonary and [place] Pager: [phone]  03/14/2013, 11:04 AM

## 2013-03-13 NOTE — Progress Notes (Signed)
Chaplain received referral from patient's RN while on unit. Met with patient's sister-in-law and nephew/son. Nephew took the lead in the conversation, explaining that patient did not want "extreme measures to keep her alive." They said they are extubating today and will not reintubate and patient will be DNR.  They expressed hope that "she will pull through" saying "she has been breathing on her own and less agitated this morning." They seem hopeful but also realistic. Chaplain provided emotional and spiritual support, mapthic listening, and a caring presence. They were grateful for chaplain support.   Please page if needed or requested.   [facility]: [phone]

## 2013-03-13 NOTE — Progress Notes (Signed)
Chaplain followed up with family, who were grateful that patient is currently breathing well on her own. They said they had no emotional or spiritual needs at this time but were grateful for support. Please page on-call chaplain over weekend if needed.   Ethelene Browns [phone]

## 2013-03-13 NOTE — Progress Notes (Signed)
Admit: 03/05/2013 LOS: 38  40F with AKI in setting of CKD3 (BL 1.2-1.8) 2/2 ATN in setting of hypoxic/hypercapneic VDRF, necrotizing L buttock skin infection, and septic shock.    Subjective:  Now 48 hours off CRRT- making urine but creatinine rising.  Making great urine actually with the assist of lasix.  Plan is for extubation with no plans for intubation is fails.  Have not addressed specifically the issue of dialysis   02/12 0701 - 02/13 0700 In: 3380 [I.V.:1220; NG/GT:1760; IV Piggyback:400] Out: 5541 [Urine:5540; Stool:1]  Filed Weights   03/11/13 0424 03/12/13 0256 03/13/13 0500  Weight: 122.1 kg (269 lb 2.9 oz) 122.6 kg (270 lb 4.5 oz) 116.3 kg (256 lb 6.3 oz)    Current meds: reviewed  Current Labs: reviewed    Physical Exam:  Blood pressure 126/59, pulse 106, temperature 97.4 F (36.3 C), temperature source Oral, resp. rate 14, height 5\' 7"  (1.702 m), weight 116.3 kg (256 lb 6.3 oz), SpO2 95.00%. GEN: obese, intubated, more alert  Right femoral temporary HD cath- placed 2/10 ENT: hirsuit, ETT in place  CV: RRR, nl s1s2  PULM: coarse bs b/l  ABD: s/nt/nd, obese  SKIN: no rashes/lesions  GU: foley catheter  PSYCH: unable to assess  EXT:1+ LEE   Assessment / PLAN 40F with AKI in setting of CKD3 (BL 1.2-1.8) 2/2 ATN in setting of hypoxic/hypercapneic VDRF, necrotizing L buttock skin infection, and septic shock.  1. AKI 2/2 ATN, anuric 2/2 sepsis + ?TMP/SMX, ARB, NSAID- previously required CRRT- was stopped on 2/11.  Non oliguric but creatinine rising. May be pushing a little too hard with diuretics given bicarb of 32.  CCM is saying that family does not want further aggressive care (no trach, no peg and no CPR)  so hopefully they understand that would mean no further dialysis but I have not spoken with them directly.  CCM really thinks that patient will not survive extubation so is likely a mute point.   2. HTN/volume- if anything is still a little overloaded- on aggressive  diuresis 3. Anemia- really not an issue 4. Septic Shock 2/2 polymicrobial / E.coli necrotizing skin infection on cipro- poor progression, thus reason for conversations regarding goals of care, surgery following as well.  5. Hypoxic and Hypercapneic VDRF- plan is for extubation today with no reintubation if fails 6. Morbid Obesity- ongoing 7. Uncontrolled DM2, admit A1c 11.3%   Rossie Scarfone A  03/13/2013, 8:16 AM   Recent Labs Lab 03/11/13 0430 03/12/13 0300 03/13/13 0500  NA 139 142 140  K 4.1 4.0 3.9  CL 100 101 93*  CO2 26 29 32  GLUCOSE 244* 310* 377*  BUN 22 37* 53*  CREATININE 1.39* 2.20* 2.58*  CALCIUM 8.7 9.2 10.1  PHOS 2.8 3.7 5.4*    Recent Labs Lab 03/11/13 0430 03/12/13 0300 03/13/13 0500  WBC 14.8* 15.1* 18.2*  NEUTROABS 11.9* 11.8* 14.3*  HGB 11.0* 10.7* 11.6*  HCT 33.7* 32.5* 35.4*  MCV 99.4 99.1 99.2  PLT 439* 439* 554*

## 2013-03-13 NOTE — Progress Notes (Signed)
[facility]    Name: Stephanie Peck MRN: [id number] DOB: [birth year]     ADMISSION DATE: 03/05/2013  CONSULTATION DATE: 03/05/13  REFERRING MD : TRH-->PCCM (2/5)   CHIEF COMPLAINT: Acute Respiratory Failure / Perirectal abscess & Sepsis   BRIEF PATIENT DESCRIPTION: [name] , with PMH of DM, GERD, Anxiety, COPD, Severe OSA, COPD, Morbid Obesity who presented to [facility] ER on 2/5 with a 4 day history of worsening left buttock abscess. CT of pelvis demonstrated multiple fluid dense areas within the subq fat of left buttock, stranding in the fat extending to anus compatible with cellulitis, and focal hemorrhage in left inguinal region, adjacent soft tissue mass.   SIGNIFICANT EVENTS / STUDIES:  2/5 - Admit with 4 day hx of worsening abscess on L buttock, fevers, chills. To OR per CCS. Returned to ICU on vent.  2/5 - CT Pelvis: multiple fluid dense areas within the subq fat of left buttock, stranding in the fat extending to anus compatible with cellulitis, and focal hemorrhage in left inguinal region, adjacent soft tissue mass (?lymph node vs additional small hemorrhage). 2/6 2D echo>>> EF55-60%,  Mild AS, trivial pericardial effusion,   LINES / TUBES:  OETT 2/5>>> Rt IJ CVL 2/5>> R femoral vein cath 2/10>>  CULTURES:  BCx2 2/5>>> NGTD Wound Culture 2/5>>> EColi  Abscess culture (OR) 2/5>>>GNR>>>EColi  Flu 2/5>>> neg  ANTIBIOTICS:  Vanco 2/5>>> 2/7 Zosyn 2/5>>> 2/8 Cipro 2/8>>>  SUBJECTIVE: Intubated. Famly present this morning and planned for extubation around 2 pm today if possible, to allow all family to visit.    VITAL SIGNS: Temp:  [97.4 F (36.3 C)-98.4 F (36.9 C)] 97.4 F (36.3 C) (02/13 0354) Pulse Rate:  [79-133] 106 (02/13 0700) Resp:  [11-23] 14 (02/13 0700) BP: (99-185)/(39-116) 126/59 mmHg (02/13 0700) SpO2:  [93 %-100 %] 95 % (02/13 0700) FiO2 (%):  [40 %] 40 % (02/13 0700) Weight:  [256 lb 6.3 oz (116.3 kg)] 256 lb 6.3 oz (116.3 kg) (02/13 0500) HEMODYNAMICS: CVP:   [10 mmHg-13 mmHg] 10 mmHg VENTILATOR SETTINGS: Vent Mode:  [-] PCV FiO2 (%):  [40 %] 40 % Set Rate:  [8 bmp] 8 bmp PEEP:  [5 cmH20] 5 cmH20 Plateau Pressure:  [17 cmH20-23 cmH20] 17 cmH20 INTAKE / OUTPUT: Intake/Output     02/12 0701 - 02/13 0700 02/13 0701 - 02/14 0700   I.V. (mL/kg) 1170 (10.1)    NG/GT 1665    IV Piggyback 400    Total Intake(mL/kg) 3235 (27.8)    Urine (mL/kg/hr) 5540 (2)    Other     Stool 1 (0)    Total Output 5541     Net -2306          Stool Occurrence 1 x     PHYSICAL EXAMINATION: General: Morbidly obese female in NAD on vent.  Neuro: sedated on vent HEENT: OETT, short / thick neck  Cardiovascular: s1s2 rrr, murmur appreciated, distant tones  Lungs: resp's even/non-labored on vent, very diminished bases. Coarse rhonchi Abdomen: Obese, soft, bs active  Skin: Warm/dry, L buttock dressing c/d/i Ext: warm, well perfused, no pitting edema bilateral ext.  LABS:  CBC  Recent Labs Lab 03/11/13 0430 03/12/13 0300 03/13/13 0500  WBC 14.8* 15.1* 18.2*  HGB 11.0* 10.7* 11.6*  HCT 33.7* 32.5* 35.4*  PLT 439* 439* 554*   Coag's  Recent Labs Lab 03/06/13 1052 03/09/13 0430 03/10/13 0553 03/11/13 0430  APTT 39* 51* 59* 52*  INR 1.28  --   --   --  BMET  Recent Labs Lab 03/11/13 0430 03/12/13 0300 03/13/13 0500  NA 139 142 140  K 4.1 4.0 3.9  CL 100 101 93*  CO2 26 29 32  BUN 22 37* 53*  CREATININE 1.39* 2.20* 2.58*  GLUCOSE 244* 310* 377*   Electrolytes  Recent Labs Lab 03/11/13 0430 03/12/13 0300 03/13/13 0500  CALCIUM 8.7 9.2 10.1  MG 2.4 2.2 1.9  PHOS 2.8 3.7 5.4*   Sepsis Markers No results found for this basename: LATICACIDVEN, PROCALCITON, O2SATVEN,  in the last 168 hours ABG  Recent Labs Lab 03/09/13 1306 03/11/13 0420 03/12/13 0349  PHART 7.320* 7.365 7.354  PCO2ART 53.4* 48.4* 53.4*  PO2ART 62.0* 110.0* 77.6*   Liver Enzymes  Recent Labs Lab 03/11/13 0430 03/12/13 0300 03/13/13 0500  ALBUMIN  2.1* 2.1* 2.3*   Cardiac Enzymes No results found for this basename: TROPONINI, PROBNP,  in the last 168 hours Glucose  Recent Labs Lab 03/12/13 0828 03/12/13 1130 03/12/13 1534 03/12/13 1843 03/13/13 0007 03/13/13 0352  GLUCAP 289* 339* 291* 277* 291* 321*   Imaging Dg Chest Port 1 View  03/12/2013   CLINICAL DATA:  Evaluate endotracheal tube positioning  EXAM: PORTABLE CHEST - 1 VIEW  COMPARISON:  DG CHEST 1V PORT dated 03/11/2013; DG CHEST 1V PORT dated 03/05/2013; DG CHEST 1V PORT dated 03/05/2013; DG CHEST 1V PORT dated 02/22/2012; DG CHEST 2 VIEW dated 01/07/2012  FINDINGS: Grossly unchanged enlarged cardiac silhouette and mediastinal contours given decreased lung volumes and patient rotation. Stable position of support apparatus. The pulmonary vasculature remains indistinct with cephalization of flow. Grossly unchanged trace/small bilateral effusions and associated bibasilar heterogeneous/consolidative opacities, left greater than right. Grossly unchanged bones.  IMPRESSION: 1.  Stable positioning of support apparatus.  No pneumothorax. 2. Grossly unchanged findings most suggestive of mildly asymmetric pulmonary edema and bibasilar atelectasis, though underlying infection is not excluded.   Electronically Signed   By: Sandi Mariscal M.D.   On: 03/12/2013 07:55   ASSESSMENT / PLAN:  PULMONARY  A:  Acute Respiratory Failure  COPD  OSA  P:  - CXR: noted. - Minimize sedation as able. - S/P lasix drip and zaroxolyn.  - Extubation to occur today, with no plans to re-extubate.  - Diureses per renal section.  CARDIOVASCULAR  A:  CHF Hypotension - improved  Shock,  likely septic  P:  - Monitor CVP (10); goal 12.  - Hold home cozaar. - Off pressors.  RENAL  A:  Acute Renal failure - ATN likely - in setting of sepsis (CKD stage 3) Hyperkalemia improved Metabolic acidosis - improving on CRT  UOP- good P:  - 5.5L/24 hr (-2.3L 24/h) - Cr elevating (2.5) off lasix drip and zaroxolyn.   electrolytes stable.   - Hold nephrotoxic agents. - Renal consulting. - Lasix x2 doses and potassium x1 dose. - Acetazolamide x2 doses to address contraction alkalosis.  GASTROINTESTINAL  A:  GERD  Hx Gastric Bypass  P:  - Protonix while intubated, not on PPI as outpt. - D/C TF post extubation.  HEMATOLOGIC  A:  Chronic Anemia  coags ok  P:  - Hold PO iron and Aranesp. - SCD's / heparin for DVT.  INFECTIOUS  A:  Left Buttock Abscess / Perirectal Cellulitis- s/p I&D 2/5.  Ecoli in both cultures- both pan sens  P:  - Abx & cultures as above. - Cipro 2/8, continue. - WBC increasing slowly - WOC consulted and deferred to [facility] following.  ENDOCRINE  A:  Diabetes  Gout  P:  - SSI with resistant scale. - Lantus 40 decrease to 20 since will be NPO. - Hold januvia, janumet, indocin.  CBG (last 3)   Recent Labs  03/12/13 1843 03/13/13 0007 03/13/13 0352  GLUCAP 277* 291* 321*   NEUROLOGIC  A:  Pain  Acute Encephalopathy  Depression / Anxiety  P:  - Fentanyl gtt for pain  - PRN versed for sedation needs  - Hold cymbalta, wellbutrin, neurontin, topamax   Raoul Pitch, [place] DO PGY-2  Summary: Panics when weaning but able to wean very well today.  Spoke with son and daughter, plan will be for husband to be here by 11 then will stop sedation and extubate.  Anticipate patient will breath for sometime, however concern here is that she panics so much to the point of respiratory failure or that she becomes unable to protect her airway.  CC time 35 min.  Patient seen and examined, agree with above note.  I dictated the care and orders written for this patient under my direction.  Rush Farmer, M.D. [facility] Pulmonary/Critical Care Medicine. Pager: [phone]. After hours pager: [phone].

## 2013-03-13 NOTE — Procedures (Signed)
Extubation Procedure Note  Patient Details:   Name: Stephanie Peck DOB: [birth year] MRN: [id number]   Airway Documentation:  Airway 7.5 mm (Active)  Secured at (cm) 23 cm 03/13/2013  9:10 AM  Measured From Lips 03/13/2013  9:10 AM  Secured Location Center 03/13/2013  9:10 AM  Secured By [employer] 03/13/2013  9:10 AM  Tube Holder Repositioned Yes 03/13/2013  9:10 AM  Cuff Pressure (cm H2O) 26 cm H2O 03/13/2013  9:10 AM  Site Condition Dry 03/13/2013  9:10 AM    Evaluation  O2 sats: stable throughout Complications: No apparent complications Patient did tolerate procedure well. Bilateral Breath Sounds: Rhonchi Suctioning: Airway Yes  Mcneil Sober 03/13/2013, 1:47 PM

## 2013-03-14 LAB — BASIC METABOLIC PANEL
BUN: 68 mg/dL — ABNORMAL HIGH (ref 6–23)
CHLORIDE: 95 meq/L — AB (ref 96–112)
CO2: 34 meq/L — AB (ref 19–32)
Calcium: 10.3 mg/dL (ref 8.4–10.5)
Creatinine, Ser: 2.87 mg/dL — ABNORMAL HIGH (ref 0.50–1.10)
GFR calc Af Amer: 20 mL/min — ABNORMAL LOW (ref >90)
GFR calc non Af Amer: 18 mL/min — ABNORMAL LOW (ref >90)
Glucose, Bld: 332 mg/dL — ABNORMAL HIGH (ref 70–99)
Potassium: 4 mEq/L (ref 3.7–5.3)
SODIUM: 143 meq/L (ref 137–147)

## 2013-03-14 LAB — RENAL FUNCTION PANEL
Albumin: 2.6 g/dL — ABNORMAL LOW (ref 3.5–5.2)
BUN: 67 mg/dL — ABNORMAL HIGH (ref 6–23)
CHLORIDE: 94 meq/L — AB (ref 96–112)
CO2: 34 mEq/L — ABNORMAL HIGH (ref 19–32)
Calcium: 10.3 mg/dL (ref 8.4–10.5)
Creatinine, Ser: 2.88 mg/dL — ABNORMAL HIGH (ref 0.50–1.10)
GFR calc Af Amer: 20 mL/min — ABNORMAL LOW (ref >90)
GFR calc non Af Amer: 17 mL/min — ABNORMAL LOW (ref >90)
GLUCOSE: 333 mg/dL — AB (ref 70–99)
PHOSPHORUS: 4.8 mg/dL — AB (ref 2.3–4.6)
POTASSIUM: 4.1 meq/L (ref 3.7–5.3)
Sodium: 142 mEq/L (ref 137–147)

## 2013-03-14 LAB — CBC WITH DIFFERENTIAL/PLATELET
BASOS ABS: 0.1 10*3/uL (ref 0.0–0.1)
Basophils Relative: 0 % (ref 0–1)
Eosinophils Absolute: 0.1 10*3/uL (ref 0.0–0.7)
Eosinophils Relative: 1 % (ref 0–5)
HEMATOCRIT: 38.2 % (ref 36.0–46.0)
Hemoglobin: 12.4 g/dL (ref 12.0–15.0)
Lymphocytes Relative: 12 % (ref 12–46)
Lymphs Abs: 2.1 10*3/uL (ref 0.7–4.0)
MCH: 32.4 pg (ref 26.0–34.0)
MCHC: 32.5 g/dL (ref 30.0–36.0)
MCV: 99.7 fL (ref 78.0–100.0)
MONO ABS: 1.1 10*3/uL — AB (ref 0.1–1.0)
Monocytes Relative: 6 % (ref 3–12)
NEUTROS ABS: 13.5 10*3/uL — AB (ref 1.7–7.7)
Neutrophils Relative %: 80 % — ABNORMAL HIGH (ref 43–77)
PLATELETS: 602 10*3/uL — AB (ref 150–400)
RBC: 3.83 MIL/uL — ABNORMAL LOW (ref 3.87–5.11)
RDW: 13.6 % (ref 11.5–15.5)
WBC: 16.8 10*3/uL — AB (ref 4.0–10.5)

## 2013-03-14 LAB — GLUCOSE, CAPILLARY
Glucose-Capillary: 233 mg/dL — ABNORMAL HIGH (ref 70–99)
Glucose-Capillary: 251 mg/dL — ABNORMAL HIGH (ref 70–99)
Glucose-Capillary: 298 mg/dL — ABNORMAL HIGH (ref 70–99)

## 2013-03-14 LAB — MAGNESIUM: Magnesium: 2.1 mg/dL (ref 1.5–2.5)

## 2013-03-14 LAB — PHOSPHORUS: PHOSPHORUS: 4.8 mg/dL — AB (ref 2.3–4.6)

## 2013-03-14 MED ORDER — MORPHINE SULFATE 2 MG/ML IJ SOLN
2.0000 mg | INTRAMUSCULAR | Status: DC | PRN
  Administered 2013-03-14 – 2013-03-17 (×15): 2 mg via INTRAVENOUS
  Filled 2013-03-14 (×13): qty 1
  Filled 2013-03-14: qty 2
  Filled 2013-03-14 (×2): qty 1

## 2013-03-14 MED ORDER — CIPROFLOXACIN IN D5W 400 MG/200ML IV SOLN: 400.0000 mg | INTRAVENOUS | Status: DC

## 2013-03-14 MED ORDER — FENTANYL CITRATE 0.05 MG/ML IJ SOLN: 50.0000 ug | INTRAMUSCULAR | Status: DC | PRN

## 2013-03-14 NOTE — Progress Notes (Signed)
Admit: 03/05/2013 LOS: 80  38F with AKI in setting of CKD3 (BL 1.2-1.8) 2/2 ATN in setting of hypoxic/hypercapneic VDRF, necrotizing L buttock skin infection, and septic shock.    Subjective:  Now 72 hours off CRRT- making urine but creatinine rising.  Lasix was weaned and has been stopped.  Pt tolerated extubation, she is non verbal but following occasional commands per nursing   02/13 0701 - 02/14 0700 In: 1557.7 [I.V.:377.7; NG/GT:780; IV Piggyback:400] Out: 4846 [Urine:4845; Stool:1]  Filed Weights   03/12/13 0256 03/13/13 0500 03/14/13 0434  Weight: 122.6 kg (270 lb 4.5 oz) 116.3 kg (256 lb 6.3 oz) 113.1 kg (249 lb 5.4 oz)    Current meds: reviewed  Current Labs: reviewed    Physical Exam:  Blood pressure 140/84, pulse 116, temperature 98.2 F (36.8 C), temperature source Oral, resp. rate 17, height 5\' 7"  (1.702 m), weight 113.1 kg (249 lb 5.4 oz), SpO2 95.00%. GEN: obese, intubated, more alert  Right femoral temporary HD cath- placed 2/10 ENT: hirsuit, ETT in place  CV: RRR, nl s1s2  PULM: coarse bs b/l  ABD: s/nt/nd, obese  SKIN: no rashes/lesions  GU: foley catheter  PSYCH: unable to assess  EXT:1+ LEE   Assessment / PLAN 38F with AKI in setting of CKD3 (BL 1.2-1.8) 2/2 ATN in setting of hypoxic/hypercapneic VDRF, necrotizing L buttock skin infection, and septic shock.  1. AKI 2/2 ATN, anuric 2/2 sepsis + ?TMP/SMX, ARB, NSAID- previously required CRRT- was stopped on 2/11.  Non oliguric but creatinine rising. Thought yest pulling too hard with diuretics, now diuretics have been stopped.   CCM is saying that family does not want further aggressive care (no trach, no peg and no CPR)  so hopefully they understand that would mean no further dialysis but I have not spoken with them directly (there is no family present at this time).  There is no acute indication for dialysis today 2. HTN/volume- if anything is still a little overloaded- good UOP on no lasix now 3. Anemia-  really not an issue 4. Septic Shock 2/2 polymicrobial / E.coli necrotizing skin infection on cipro- poor progression, thus reason for conversations regarding goals of care, surgery following as well.  5. Hypoxic and Hypercapneic VDRF- plan is for  no reintubation  6. Morbid Obesity- ongoing 7. Uncontrolled DM2, admit A1c 11.3%   Stephanie Peck A  03/14/2013, 7:43 AM   Recent Labs Lab 03/12/13 0300 03/13/13 0500 03/14/13 0400  NA 142 140 142  143  K 4.0 3.9 4.1  4.0  CL 101 93* 94*  95*  CO2 29 32 34*  34*  GLUCOSE 310* 377* 333*  332*  BUN 37* 53* 67*  68*  CREATININE 2.20* 2.58* 2.88*  2.87*  CALCIUM 9.2 10.1 10.3  10.3  PHOS 3.7 5.4* 4.8*  4.8*    Recent Labs Lab 03/12/13 0300 03/13/13 0500 03/14/13 0400  WBC 15.1* 18.2* 16.8*  NEUTROABS 11.8* 14.3* 13.5*  HGB 10.7* 11.6* 12.4  HCT 32.5* 35.4* 38.2  MCV 99.1 99.2 99.7  PLT 439* 554* 602*

## 2013-03-14 NOTE — Progress Notes (Signed)
ANTIBIOTIC CONSULT NOTE - FOLLOW UP  Pharmacy Consult:  Cipro Indication:  E.coli wound infection  Allergies  Allergen Reactions  . Celebrex [Celecoxib] Shortness Of Breath  . Codeine Nausea Only    Patient Measurements: Height: 5\' 7"  (170.2 cm) Weight: 249 lb 5.4 oz (113.1 kg) IBW/kg (Calculated) : 61.6  Vital Signs: Temp: 98 F (36.7 C) (02/14 0829) Temp src: Oral (02/14 0829) BP: 130/58 mmHg (02/14 1000) Pulse Rate: 102 (02/14 1000) Intake/Output from previous day: 02/13 0701 - 02/14 0700 In: 1567.7 [I.V.:387.7; NG/GT:780; IV Piggyback:400] Out: 4846 [BHALP:3790; Stool:1] Intake/Output from this shift: Total I/O In: 30 [I.V.:30] Out: 475 [Urine:475]  Labs:  Recent Labs  03/12/13 0300 03/13/13 0500 03/14/13 0400  WBC 15.1* 18.2* 16.8*  HGB 10.7* 11.6* 12.4  PLT 439* 554* 602*  CREATININE 2.20* 2.58* 2.88*  2.87*   Estimated Creatinine Clearance: 29 ml/min (by C-G formula based on Cr of 2.88). No results found for this basename: VANCOTROUGH, VANCOPEAK, VANCORANDOM, GENTTROUGH, GENTPEAK, GENTRANDOM, TOBRATROUGH, TOBRAPEAK, TOBRARND, AMIKACINPEAK, AMIKACINTROU, AMIKACIN,  in the last 72 hours    Assessment: 73 YOF with E.coli cellulitis and large gluteal abscess s/p I&D to remain on Cipro therapy.  WBC= 16.8, afeb, SCr= 2.88 with trend up, UOP 1.8 ml/kg/hr.   Cipro 2/8 >> Vanc 2/5 >> 2/8 Zosyn 2/5 >> 2/8  2/5 Abscess cx - E.coli (pan sensitive) 2/5 BC x 2 - NGTD 2/5 MSRA PCR - negative 2/5 fungus / AFB cx - pending   Goal of Therapy:  Clearance of infection   Plan:  - Change Cipro to 400mg  IV Q24hr - F/U renal fxn in AM and adjust dosage as indicated  Hildred Laser, Pharm D 03/14/2013 12:50 PM

## 2013-03-14 NOTE — Progress Notes (Signed)
Extensive discussion with family. We discussed the poor prognosis and likely poor quality of life. Family has decided to offer full comfort care ( no CPR/mechanical ventilation/defibrillation, also no HD, tube feeding or antibiotics ). They are aware that the patient may be transferred to medical floor for continued comfort care needs. They are interested in possible hospice placement. They have been fully updated on the process and expectations.  Doree Fudge, MD Pulmonary and [place] Pager: [phone]

## 2013-03-14 NOTE — Progress Notes (Addendum)
Stephanie Peck [id number]  Transfer Data: 03/14/2013 5:37 PM  Attending Provider: Brand Males, MD  [id number], CAROL, D, MD  Code Status: DNR  Stephanie Peck is a 53 y.o. female patient transferred from 62M -No acute distress noted.  -No complaints of shortness of breath.  -No complaints of chest pain.   Blood pressure 138/80, pulse 94, temperature 97.3 F (36.3 C), temperature source Axillary, resp. rate 20, height 5\' 7"  (1.702 m), weight 113.1 kg (249 lb 5.4 oz), SpO2 98.00%.  ?   Allergies: Celebrex and Codeine  Past Medical History:  has a past medical history of Diabetes mellitus; GERD (gastroesophageal reflux disease); Anxiety; CHF (congestive heart failure); COPD (chronic obstructive pulmonary disease); Sleep apnea; Depression; and Morbid obesity.  Past Surgical History:  has past surgical history that includes Cholecystectomy; Gastric bypass (2006); Tonsillectomy; Femoral artery stent (12/05/2011); Endarterectomy femoral (02/22/2012); Patch angioplasty (02/22/2012); Application if wound vac (02/22/2012); Groin debridement (03/01/2012); Aortogram (Left, 04/04/2012); Patch angioplasty (Left, 04/04/2012); Muscle flap closure (Left, 04/04/2012); and Irrigation and debridement abscess (Left, 03/05/2013).  Social History:  reports that she quit smoking about 16 months ago. Her smoking use included Cigarettes. She started smoking about 17 months ago. She has a 40 pack-year smoking history. She has never used smokeless tobacco. She reports that she does not drink alcohol or use illicit drugs.   Patient/Family orientated to room. Information packet given to patient/family. Admission inpatient armband information verified with patient/family to include name and date of birth and placed on patient arm. Side rails up x 2, fall assessment and education completed with patient/family. Patient/family able to verbalize understanding of risk associated with falls and verbalized understanding to call for assistance  before getting out of bed. Call light within reach. Patient/family able to voice and demonstrate understanding of unit orientation instructions.  Will continue to evaluate and treat per MD orders.

## 2013-03-15 DIAGNOSIS — Z515 Encounter for palliative care: Secondary | ICD-10-CM

## 2013-03-15 NOTE — Progress Notes (Signed)
Pt's husband Jeneen Rinks and son Dian Situ in room and had questions for CSW. CSW went to room and husband asked about plan for discharge. CSW explained referrals have been made to residential hospice at [facility] and [facility], and they would have to determine if she fits the requirements for this and this is dependent on bed availability and life expectancy. Jeneen Rinks and [name] expressed understanding of this. CSW explained res hospices have referrals and will let unit CSW know if they think she is appropriate. CSW also sent clinicals to SNFs and family states [facility] is their favorite if this is the option they pursue. CSW called [facility] and left admissions a voicemail saying this is the first choice facility for family. CSW provided handoff for unit CSW, who will follow up with pt/family tomorrow.   Ky Barban, MSW, [facility] Clinical Social Worker [phone]

## 2013-03-15 NOTE — Progress Notes (Signed)
PULMONARY / CRITICAL CARE MEDICINE  Name: Stephanie Peck MRN: [id number] DOB: [birth year]    ADMISSION DATE: 03/05/2013  CONSULTATION DATE: 03/05/2013   REFERRING MD : TRH  CHIEF COMPLAINT: Acute respiratory distress  BRIEF PATIENT DESCRIPTION: 53 y/o morbidly obese with OSA, COPDand DM admitted 2/5 with left buttock abscess.  SIGNIFICANT EVENTS / STUDIES:  2/5  CT Pelvis >>> Multiple fluid dense areas within the subcutaneous fat of left buttock, stranding in the fat extending to anus compatible with cellulitis, and focal hemorrhage in left inguinal region, adjacent soft tissue mass vs lymph node vs small hemorrhage 2/5  OR >>> I&D 2/6 TTE >>> EF 55-60%,  mild AS, trivial pericardial effusion 2/14 trf to floor/ NCB/comfort rx   LINES / TUBES:  OETT 2/5 >>> 2/13 R IJ CVL 2/5 >>>  R fem  HD cath 2/10 >>>  CULTURES:  2/5  Blood >>> neg 2/5  Wound >>> E.COLI 2/5  Flu PCR >>> neg  ANTIBIOTICS:  Vancomycin 2/5 >>> 2/7 Zosyn 2/5 >>> 2/8 Cipro 2/8 >> 2/14  INTERVAL HISTORY:   Comfortable on floor, nods approp, eating some soup, fm at bedside  VITAL SIGNS: Temp:  [97.3 F (36.3 C)-98.1 F (36.7 C)] 97.4 F (36.3 C) (02/15 0608) Pulse Rate:  [94-110] 98 (02/15 0608) Resp:  [17-21] 18 (02/15 0608) BP: (121-151)/(68-89) 126/75 mmHg (02/15 0608) SpO2:  [93 %-98 %] 98 % (02/15 0608)  HEMODYNAMICS:    VENTILATOR SETTINGS:    INTAKE / OUTPUT: Intake/Output     02/14 0701 - 02/15 0700 02/15 0701 - 02/16 0700   I.V. (mL/kg) 70 (0.6)    NG/GT     IV Piggyback     Total Intake(mL/kg) 70 (0.6)    Urine (mL/kg/hr) 1140 (0.4)    Stool     Total Output 1140     Net -1070          Stool Occurrence 1 x     PHYSICAL EXAMINATION: General:  Appears ill, morbidly obese Neuro:  Eye contact but non verbal HEENT:  PERRL Cardiovascular:  RRR, no m/r/g Lungs:  Bilateral diminished air entry, few rhonchi Abdomen:  Soft, nontender, bowel sounds diminished Musculoskeletal:  Moves all  extremities, bilateral pitting edema Skin:  Surgical dressing intact  LABS:  CBC  Recent Labs Lab 03/12/13 0300 03/13/13 0500 03/14/13 0400  WBC 15.1* 18.2* 16.8*  HGB 10.7* 11.6* 12.4  HCT 32.5* 35.4* 38.2  PLT 439* 554* 602*   Coag's  Recent Labs Lab 03/09/13 0430 03/10/13 0553 03/11/13 0430  APTT 51* 59* 52*   BMET  Recent Labs Lab 03/12/13 0300 03/13/13 0500 03/14/13 0400  NA 142 140 142  143  K 4.0 3.9 4.1  4.0  CL 101 93* 94*  95*  CO2 29 32 34*  34*  BUN 37* 53* 67*  68*  CREATININE 2.20* 2.58* 2.88*  2.87*  GLUCOSE 310* 377* 333*  332*   Electrolytes  Recent Labs Lab 03/12/13 0300 03/13/13 0500 03/14/13 0400  CALCIUM 9.2 10.1 10.3  10.3  MG 2.2 1.9 2.1  PHOS 3.7 5.4* 4.8*  4.8*   Sepsis Markers No results found for this basename: LATICACIDVEN, PROCALCITON, O2SATVEN,  in the last 168 hours  ABG  Recent Labs Lab 03/09/13 1306 03/11/13 0420 03/12/13 0349  PHART 7.320* 7.365 7.354  PCO2ART 53.4* 48.4* 53.4*  PO2ART 62.0* 110.0* 77.6*   Liver Enzymes  Recent Labs Lab 03/12/13 0300 03/13/13 0500 03/14/13 0400  ALBUMIN 2.1* 2.3* 2.6*  Cardiac Enzymes No results found for this basename: TROPONINI, PROBNP,  in the last 168 hours  Glucose  Recent Labs Lab 03/13/13 1155 03/13/13 1557 03/13/13 1920 03/14/13 0031 03/14/13 0728 03/14/13 1135  GLUCAP 328* 272* 212* 298* 251* 233*   CXR: None today  ASSESSMENT / PLAN:  PULMONARY  A:  Acute respiratory failure, resolved COPD  OSA / OHS P:  Comfort goals met   CARDIOVASCULAR  A:  Acute diastolic congestive heart failure Sepsis, improving P:    DNR  RENAL  A:  AKI P:  Renal signed off 2/15 as comfort care now    GASTROINTESTINAL  A:  GERD  Hx gastric bypass  GI Px is not indicated P:  D/cd Protonix    HEMATOLOGIC  A:  Chronic anemia  VTE Px P:   SCDs  INFECTIOUS  A:  Left buttock abscess, s/p I&D 2/5 Perirectal cellulitis P:   Stopped cipro 2/14 as combort care   ENDOCRINE  A:  Diabetes  Hyperglycemia P:  Comfort care  NEUROLOGIC  A:  Pain  Acute encephalopathy  Depression / anxiety  P:  Comfort goals achieved       Christinia Gully, MD Pulmonary and [place] Pager: [phone]  03/15/2013, 12:44 PM  Christinia Gully, MD Pulmonary and [place] Pager: [phone]

## 2013-03-15 NOTE — Progress Notes (Addendum)
CSW received text from weekend CSW that pt/family interested in residential hospice and that [facility] called weekend CSW with bed offer wondering if pt/family want to take this. CSW spoke with in-law Romie Minus, and Romie Minus is going to call family and explain there is a bed available at [facility]. CSW explained informing CSW as soon as possible is paramount, as facility is trying to fill the bed. Romie Minus will either speak with CSW directly if CSW is still on floor, or will call CSW with family decision. Room and board fee? Transitioning home with hospice if survives longer than 6 weeks? CSW called Sheriee with [facility] to follow up on bed offer and is waiting for a return call. CSW asked family's questions in voicemail to [facility] and is waiting for return call. CSW paged MD, who is working on discharge summary now.  Addendum: residential hospice referral made, and clinicals faxed to SNFs with directive that palliative would be following pt and she is full comfort care.  Ky Barban, MSW, [facility] Clinical Social Worker [phone]

## 2013-03-15 NOTE — Progress Notes (Signed)
Clinical Social Work Department CLINICAL SOCIAL WORK PLACEMENT NOTE 03/15/2013  Patient:  ABAIGEAL, MOOMAW  Account Number:  [id number] Admit date:  03/05/2013  Clinical Social Worker:  Ky Barban, Latanya Presser  Date/time:  03/15/2013 12:57 PM  Clinical Social Work is seeking post-discharge placement for this patient at the following level of care:   [place]   (*CSW will update this form in Epic as items are completed)   N/A-family authorized CSW send clinicals to all SNFs in [facility]  Patient/family provided with [place] Department of Clinical Social Work's list of facilities offering this level of care within the geographic area requested by the patient (or if unable, by the patient's family).  03/15/2013  Patient/family informed of their freedom to choose among providers that offer the needed level of care, that participate in Medicare, Medicaid or managed care program needed by the patient, have an available bed and are willing to accept the patient.  N/A-clinicals not sent to this SNF  Patient/family informed of [facility]' ownership interest in [facility], as well as of the fact that they are under no obligation to receive care at this facility.  PASARR submitted to EDS on 03/15/2013 PASARR number received from EDS on 03/15/2013  FL2 transmitted to all facilities in geographic area requested by pt/family on  03/15/2013 FL2 transmitted to all facilities within larger geographic area on   Patient informed that his/her managed care company has contracts with or will negotiate with  certain facilities, including the following:     Patient/family informed of bed offers received:   Patient chooses bed at  Physician recommends and patient chooses bed at    Patient to be transferred to  on   Patient to be transferred to facility by   The following physician request were entered in Epic:   Additional Comments:   Ky Barban, MSW, [place]  Social Worker [phone]

## 2013-03-15 NOTE — Progress Notes (Signed)
I see plans for full comfort care and no plans for HD which is appropriate.  Renal will sign off under these circumstances.   Stephanie Peck A

## 2013-03-15 NOTE — Progress Notes (Signed)
Clinical Social Work Department BRIEF PSYCHOSOCIAL ASSESSMENT 03/15/2013  Patient:  Stephanie Peck, Stephanie Peck     Account Number:  [id number]     Admit date:  03/05/2013  Clinical Social Worker:  Freeman Caldron  Date/Time:  03/15/2013 12:49 PM  Referred by:  Physician  Date Referred:  03/15/2013 Referred for  Residential hospice placement  SNF Placement   Other Referral:   Interview type:  Patient Other interview type:   CSW also spoke with extended family: Stephanie Peck, and pt's son.    PSYCHOSOCIAL DATA Living Status:  FAMILY Admitted from facility:   Level of care:   Primary support name:  Stephanie Peck ([phone]) Primary support relationship to patient:  SPOUSE Degree of support available:   Great--pt's family in room during assessment and pt lives with husband Stephanie Peck.    CURRENT CONCERNS Current Concerns  Post-Acute Placement   Other Concerns:    SOCIAL WORK ASSESSMENT / PLAN CSW spoke with Stephanie Peck, family member at bedside about family's wishes for discharge. Stephanie Peck explained residential hospice would be ideal for pt, who is now full comfort care. Pt's niece Stephanie Peck spoke with CSW over the phone, and had some questions about hospice. CSW answered questions for family. CSW made referrals to [facility] and [place] residential facilities; family's first choice is [facility], as pt's spouse is wheelchair bound and family explains whatever is closest is best. CSW explained that going to SNF with palliative care following is also an option, and explained this service to family. Family states their first choice would be a residential hospice facility, but they are agreeable to SNF with comfort care. CSW has made referrals to SNFs in [facility] with a message stating pt is comfort care and would have palliative following.   Assessment/plan status:  Psychosocial Support/Ongoing Assessment of Needs Other assessment/ plan:   Information/referral to community resources:    Residential Hospice? SNF?    PATIENT'S/FAMILY'S RESPONSE TO PLAN OF CARE: Good--pt's family friendly and engaged in conversation with CSW. Family understanding of difference between residential hospice and SNF with palliative, and understand unit CSW will follow up with them tomorrow after CSW hears back from facilities for placement options. Family clearly cares deeply for pt, evidenced by multiple family members in room and phone call [place].       Ky Barban, MSW, [facility] Clinical Social Worker [phone]

## 2013-03-16 LAB — GLUCOSE, CAPILLARY
GLUCOSE-CAPILLARY: 275 mg/dL — AB (ref 70–99)
Glucose-Capillary: 268 mg/dL — ABNORMAL HIGH (ref 70–99)
Glucose-Capillary: 234 mg/dL — ABNORMAL HIGH (ref 70–99)

## 2013-03-16 MED ORDER — INSULIN ASPART 100 UNIT/ML ~~LOC~~ SOLN
0.0000 [IU] | Freq: Three times a day (TID) | SUBCUTANEOUS | Status: DC
  Administered 2013-03-16 – 2013-03-17 (×3): 5 [IU] via SUBCUTANEOUS

## 2013-03-16 NOTE — Clinical Social Work Placement (Signed)
Clinical Social Work Department CLINICAL SOCIAL WORK PLACEMENT NOTE 03/16/2013  Patient:  Stephanie Peck, Stephanie Peck  Account Number:  [id number] Admit date:  03/05/2013  Clinical Social Worker:  Ky Barban, Latanya Presser  Date/time:  03/15/2013 12:57 PM  Clinical Social Work is seeking post-discharge placement for this patient at the following level of care:   SKILLED NURSING   (*CSW will update this form in Epic as items are completed)     Patient/family provided with [place] Department of Clinical Social Work's list of facilities offering this level of care within the geographic area requested by the patient (or if unable, by the patient's family).  03/15/2013  Patient/family informed of their freedom to choose among providers that offer the needed level of care, that participate in Medicare, Medicaid or managed care program needed by the patient, have an available bed and are willing to accept the patient.    Patient/family informed of [facility] ownership interest in [facility], as well as of the fact that they are under no obligation to receive care at this facility.  PASARR submitted to EDS on 03/15/2013 PASARR number received from EDS on 03/15/2013  FL2 transmitted to all facilities in geographic area requested by pt/family on  03/15/2013 FL2 transmitted to all facilities within larger geographic area on   Patient informed that his/her managed care company has contracts with or will negotiate with  certain facilities, including the following:     Patient/family informed of bed offers received:  03/16/2013 Patient chooses bed at [place] Physician recommends and patient chooses bed at    Patient to be transferred to [place] on   Patient to be transferred to facility by   The following physician request were entered in Epic:   Additional Comments: CSW met with family and patient a few times on 03/16/13. Family and  patient are considering SNF placement at [facility] with Palliative Services following. CSW will facilitate DC once DC order is received. CSW notes that PMT is planning to meet with patient and family.   Liz Beach, [place], [place], [phone]

## 2013-03-16 NOTE — Evaluation (Signed)
Physical Therapy Evaluation Patient Details Name: Stephanie Peck MRN: [id number] DOB: [birth year] Today's Date: 03/16/2013 Time: 1202-1230 PT Time Calculation (min): 28 min  PT Assessment / Plan / Recommendation History of Present Illness  Pt adm with sepsis and respiratory failure due to gluteal abcess. Pt intubated 2/5 - 2/13. Pt with history of DM, sleep apnea, CHF, Copd.  Clinical Impression  Pt admitted with above. Pt currently with functional limitations due to the deficits listed below (see PT Problem List).  Pt will benefit from skilled PT to increase their independence and safety with mobility to allow discharge to the venue listed below.       PT Assessment  Patient needs continued PT services    Follow Up Recommendations  SNF    Does the patient have the potential to tolerate intense rehabilitation      Barriers to Discharge        Equipment Recommendations  Wheelchair (measurements PT);None recommended by PT    Recommendations for Other Services     Frequency Min 2X/week    Precautions / Restrictions Precautions Precautions: Fall   Pertinent Vitals/Pain No c/o's      Mobility  Bed Mobility Overal bed mobility: Needs Assistance Bed Mobility: Sit to Sidelying;Rolling;Supine to Sit Rolling: Mod assist Supine to sit: Mod assist;HOB elevated Sit to sidelying: +2 for physical assistance;Mod assist General bed mobility comments: Assist to raise trunk. Assist to bring feet up and to lower trunk.    Exercises     PT Diagnosis: Difficulty walking;Generalized weakness  PT Problem List: Obesity PT Treatment Interventions: DME instruction;Gait training;Functional mobility training;Therapeutic activities;Therapeutic exercise;Balance training;Patient/family education     PT Goals(Current goals can be found in the care plan section) Acute Rehab PT Goals Patient Stated Goal: To be able to get on [facility] or in bathroom. PT Goal Formulation: With patient Time For Goal  Achievement: 03/23/13 Potential to Achieve Goals: Fair  Visit Information  Last PT Received On: 03/16/13 Assistance Needed: +2 History of Present Illness: Pt adm with sepsis and respiratory failure due to gluteal abcess. Pt intubated 2/5 - 2/13. Pt with history of DM, sleep apnea, CHF, Copd.       Prior [place] expects to be discharged to:: Skilled nursing facility Living Arrangements: Spouse/significant other Available Help at Discharge: Family;Available 24 hours/day Type of Home: House Home Access: Ramped entrance Home Layout: One level Home Equipment: Cane - single point Prior Function Level of Independence: Independent with assistive device(s)    Cognition  Cognition Arousal/Alertness: Awake/alert Behavior During Therapy: WFL for tasks assessed/performed Overall Cognitive Status: Impaired/Different from baseline Area of Impairment: Safety/judgement Safety/Judgement: Decreased awareness of deficits General Comments: Pt still wanted to attempt to get to the [facility] even though she could not hold herself up on EOB.    Extremity/Trunk Assessment Upper Extremity Assessment Upper Extremity Assessment: Generalized weakness Lower Extremity Assessment Lower Extremity Assessment: Generalized weakness   Balance Balance Overall balance assessment: Needs assistance Sitting-balance support: Bilateral upper extremity supported Sitting balance-Leahy Scale: Poor Sitting balance - Comments: Initially on sitting EOB pt began falling over to the rt and required total assist to prevent fall from bed. Pt then sat EOB for 15 minutes with min to mod A. Most of time spent leaning on someone with her head and trunk to the rt but able to sit brief periods with min assist. Postural control: Right lateral lean  End of Session PT - End of Session Activity Tolerance: Patient limited by fatigue Patient  left: in bed;with call bell/phone within reach;with family/visitor  present Nurse Communication: Mobility status;Need for lift equipment  GP     Daren Yeagle 03/16/2013, 1:43 PM  [place]

## 2013-03-16 NOTE — Progress Notes (Signed)
Chaplain gave emotional support through conversation and empathic listening.  Chaplain also prayed with pt and pt's husband.  Pt and pt's husband expressed gratitude for the chaplain's support.   03/16/13 1600  Clinical Encounter Type  Visited With Patient and family together  Visit Type Spiritual support    Estelle June, chaplain pager [phone]

## 2013-03-16 NOTE — Progress Notes (Signed)
Nutrition Brief Note  Chart reviewed. Pt now transitioning to comfort care. Per MD note on 2/14 - family desires no HD or tube feeding. Renal has signed off. No further nutrition interventions warranted at this time.  Please re-consult as needed.   Inda Coke MS, RD, LDN Inpatient Registered Dietitian Pager: [phone] After-hours pager: [phone]

## 2013-03-16 NOTE — Progress Notes (Signed)
Palliative Consult received will meet with family at the earliest possible time we have an appointment time and provider available. If there are urgent needs please call [phone].  Lane Hacker, DO Palliative Medicine

## 2013-03-16 NOTE — Progress Notes (Signed)
PULMONARY / CRITICAL CARE MEDICINE  Name: Stephanie Peck MRN: [id number] DOB: 1960-03-05    ADMISSION DATE: 03/05/2013  CONSULTATION DATE: 03/05/2013   REFERRING MD : TRH  CHIEF COMPLAINT: Acute respiratory distress  BRIEF PATIENT DESCRIPTION: 53 y/o morbidly obese with OSA, COPDand DM admitted 2/5 with left buttock abscess.  SIGNIFICANT EVENTS / STUDIES:  2/5  CT Pelvis >>> Multiple fluid dense areas within the subcutaneous fat of left buttock, stranding in the fat extending to anus compatible with cellulitis, and focal hemorrhage in left inguinal region, adjacent soft tissue mass vs lymph node vs small hemorrhage 2/5  OR >>> I&D 2/6 TTE >>> EF 55-60%,  mild AS, trivial pericardial effusion 2/14 trf to floor/ NCB/comfort rx  2/16 Awake and alert and wants rehab.  LINES / TUBES:  OETT 2/5 >>> 2/13 R IJ CVL 2/5 >>   R fem  HD cath 2/10 >>   CULTURES:  2/5  Blood >>> neg 2/5  Wound >>> E.COLI 2/5  Flu PCR >>> neg  ANTIBIOTICS:  Vancomycin 2/5 >>> 2/7 Zosyn 2/5 >>> 2/8 Cipro 2/8 >> 2/14  INTERVAL HISTORY:   Comfortable,speech clear very upbeat.  VITAL SIGNS: Temp:  [97.6 F (36.4 C)-97.9 F (36.6 C)] 97.6 F (36.4 C) (02/16 0500) Pulse Rate:  [74-85] 74 (02/16 0500) Resp:  [18] 18 (02/16 0500) BP: (126-154)/(74-78) 126/74 mmHg (02/16 0500) SpO2:  [95 %-100 %] 100 % (02/16 0500)  HEMODYNAMICS:    VENTILATOR SETTINGS:    INTAKE / OUTPUT: Intake/Output     02/15 0701 - 02/16 0700 02/16 0701 - 02/17 0700   I.V. (mL/kg)     Total Intake(mL/kg)     Urine (mL/kg/hr)     Total Output       Net             PHYSICAL EXAMINATION: General:  Awake and alert, NAD Neuro: diffusely weak, no focal deficits, cognition intact Cardiovascular:  RRR, no m/r/g Lungs:  Clear anteriorly Abdomen:  Soft, nontender, bowel sounds diminished Ext: symmetric edema Skin:  Surgical dressing intact, rt buttock, sacral pad noted  LABS:  CBC I have reviewed all of today's lab  results. Relevant abnormalities are discussed in the A/P section    CXR: None today  ASSESSMENT / PLAN:  PULMONARY  A:  Acute respiratory failure, resolved COPD  OSA / OHS P:  Wants rehab, on O2  CARDIOVASCULAR  A:  Acute diastolic congestive heart failure Sepsis, improving P:     RENAL  A:  AKI CKD, stage III Poor candidate for long term HD P:  Recheck chemistries If decision is for no more HD, remove HD catheter    GASTROINTESTINAL  A:  GERD  Hx gastric bypass  GI Px is not indicated P:  Begin CHO modified diet    HEMATOLOGIC  A:  Chronic anemia  P:  VTE Px: SCDs  INFECTIOUS  A:  Left buttock abscess, s/p I&D 2/5 Perirectal cellulitis P:  Micro and abx as above  ENDOCRINE  A:  Diabetes  Hyperglycemia P:  Resume ACHS SSI  NEUROLOGIC  A:  Pain  Acute encephalopathy (resolved 2/16) Depression / anxiety  Pain P:  Cont PRN MSO4   Global: Family and patient want rehabilitation. Resume some care, pt consult. Seek out SNF   [facility] Minor ACNP Maryanna Shape PCCM Pager [phone] till 3 pm If no answer page [phone] 03/16/2013, 10:55 AM   PCCM ATTENDING: I have interviewed and examined the patient and reviewed the  database. I have formulated the assessment and plan as reflected in the note above with amendments made by me.   She was made DNR and all therapies except MSO4 were stopped last week. She has done much better than anticipated and presently is comfortable and oriented with RASS of 0. She has questions re: options of care presently and even asks if further HD is planned. It seems that she is unclear what direction that she would want. I am seeing her for the first time presently and have difficulty offering proper direction here. Her acute illness is certainly not irreversible but she has suffered numerous chronic medical problems.   I have requested Palliative Care services to help [place] define goals of care and how best to reach those  goals.  Merton Border, MD;  PCCM service; [place] [phone]

## 2013-03-17 DIAGNOSIS — J449 Chronic obstructive pulmonary disease, unspecified: Secondary | ICD-10-CM | POA: Diagnosis not present

## 2013-03-17 DIAGNOSIS — E119 Type 2 diabetes mellitus without complications: Secondary | ICD-10-CM | POA: Diagnosis not present

## 2013-03-17 DIAGNOSIS — E669 Obesity, unspecified: Secondary | ICD-10-CM | POA: Diagnosis not present

## 2013-03-17 DIAGNOSIS — A4159 Other Gram-negative sepsis: Secondary | ICD-10-CM | POA: Diagnosis not present

## 2013-03-17 DIAGNOSIS — R279 Unspecified lack of coordination: Secondary | ICD-10-CM | POA: Diagnosis not present

## 2013-03-17 DIAGNOSIS — L8990 Pressure ulcer of unspecified site, unspecified stage: Secondary | ICD-10-CM | POA: Diagnosis not present

## 2013-03-17 DIAGNOSIS — L0231 Cutaneous abscess of buttock: Secondary | ICD-10-CM | POA: Diagnosis not present

## 2013-03-17 DIAGNOSIS — G4733 Obstructive sleep apnea (adult) (pediatric): Secondary | ICD-10-CM | POA: Diagnosis not present

## 2013-03-17 DIAGNOSIS — K219 Gastro-esophageal reflux disease without esophagitis: Secondary | ICD-10-CM | POA: Diagnosis not present

## 2013-03-17 DIAGNOSIS — I509 Heart failure, unspecified: Secondary | ICD-10-CM | POA: Diagnosis not present

## 2013-03-17 DIAGNOSIS — J9600 Acute respiratory failure, unspecified whether with hypoxia or hypercapnia: Secondary | ICD-10-CM | POA: Diagnosis not present

## 2013-03-17 DIAGNOSIS — L03317 Cellulitis of buttock: Secondary | ICD-10-CM | POA: Diagnosis not present

## 2013-03-17 DIAGNOSIS — R262 Difficulty in walking, not elsewhere classified: Secondary | ICD-10-CM | POA: Diagnosis not present

## 2013-03-17 DIAGNOSIS — I5020 Unspecified systolic (congestive) heart failure: Secondary | ICD-10-CM | POA: Diagnosis not present

## 2013-03-17 DIAGNOSIS — M6281 Muscle weakness (generalized): Secondary | ICD-10-CM | POA: Diagnosis not present

## 2013-03-17 DIAGNOSIS — L89309 Pressure ulcer of unspecified buttock, unspecified stage: Secondary | ICD-10-CM | POA: Diagnosis not present

## 2013-03-17 DIAGNOSIS — N179 Acute kidney failure, unspecified: Secondary | ICD-10-CM | POA: Diagnosis not present

## 2013-03-17 DIAGNOSIS — L98499 Non-pressure chronic ulcer of skin of other sites with unspecified severity: Secondary | ICD-10-CM | POA: Diagnosis not present

## 2013-03-17 DIAGNOSIS — N183 Chronic kidney disease, stage 3 unspecified: Secondary | ICD-10-CM | POA: Diagnosis not present

## 2013-03-17 DIAGNOSIS — R069 Unspecified abnormalities of breathing: Secondary | ICD-10-CM | POA: Diagnosis not present

## 2013-03-17 LAB — CBC
HEMATOCRIT: 38.5 % (ref 36.0–46.0)
Hemoglobin: 12.4 g/dL (ref 12.0–15.0)
MCH: 31.4 pg (ref 26.0–34.0)
MCHC: 32.2 g/dL (ref 30.0–36.0)
MCV: 97.5 fL (ref 78.0–100.0)
Platelets: 525 10*3/uL — ABNORMAL HIGH (ref 150–400)
RBC: 3.95 MIL/uL (ref 3.87–5.11)
RDW: 13.5 % (ref 11.5–15.5)
WBC: 11.3 10*3/uL — AB (ref 4.0–10.5)

## 2013-03-17 LAB — BASIC METABOLIC PANEL
BUN: 72 mg/dL — AB (ref 6–23)
CHLORIDE: 95 meq/L — AB (ref 96–112)
CO2: 23 meq/L (ref 19–32)
Calcium: 9.4 mg/dL (ref 8.4–10.5)
Creatinine, Ser: 2.39 mg/dL — ABNORMAL HIGH (ref 0.50–1.10)
GFR calc Af Amer: 25 mL/min — ABNORMAL LOW (ref >90)
GFR, EST NON AFRICAN AMERICAN: 22 mL/min — AB (ref >90)
GLUCOSE: 299 mg/dL — AB (ref 70–99)
POTASSIUM: 5.1 meq/L (ref 3.7–5.3)
SODIUM: 135 meq/L — AB (ref 137–147)

## 2013-03-17 LAB — GLUCOSE, CAPILLARY
GLUCOSE-CAPILLARY: 284 mg/dL — AB (ref 70–99)
GLUCOSE-CAPILLARY: 284 mg/dL — AB (ref 70–99)

## 2013-03-17 MED ORDER — LINAGLIPTIN 5 MG PO TABS
5.0000 mg | ORAL_TABLET | Freq: Every day | ORAL | Status: DC
  Administered 2013-03-17: 5 mg via ORAL
  Filled 2013-03-17: qty 1

## 2013-03-17 MED ORDER — INSULIN ASPART 100 UNIT/ML ~~LOC~~ SOLN: 0.0000 [IU] | Freq: Three times a day (TID) | SUBCUTANEOUS | Status: DC

## 2013-03-17 MED ORDER — INSULIN ASPART 100 UNIT/ML ~~LOC~~ SOLN: 0.0000 [IU] | Freq: Every day | SUBCUTANEOUS | Status: DC

## 2013-03-17 MED ORDER — PANTOPRAZOLE SODIUM 40 MG PO TBEC
40.0000 mg | DELAYED_RELEASE_TABLET | Freq: Every day | ORAL | Status: DC
  Administered 2013-03-17: 40 mg via ORAL
  Filled 2013-03-17: qty 1

## 2013-03-17 MED ORDER — TOPIRAMATE 25 MG PO TABS
50.0000 mg | ORAL_TABLET | Freq: Every day | ORAL | Status: DC
  Administered 2013-03-17: 50 mg via ORAL
  Filled 2013-03-17: qty 2

## 2013-03-17 MED ORDER — DULOXETINE HCL 30 MG PO CPEP
30.0000 mg | ORAL_CAPSULE | Freq: Every day | ORAL | Status: DC
  Administered 2013-03-17: 30 mg via ORAL
  Filled 2013-03-17: qty 1

## 2013-03-17 MED ORDER — INSULIN DETEMIR 100 UNIT/ML ~~LOC~~ SOLN: 15.0000 [IU] | Freq: Every day | SUBCUTANEOUS | Status: DC

## 2013-03-17 MED ORDER — ASPIRIN EC 325 MG PO TBEC
325.0000 mg | DELAYED_RELEASE_TABLET | Freq: Every day | ORAL | Status: DC
  Administered 2013-03-17: 325 mg via ORAL
  Filled 2013-03-17: qty 1

## 2013-03-17 MED ORDER — ASPIRIN 325 MG PO TBEC: 325.0000 mg | DELAYED_RELEASE_TABLET | Freq: Every day | ORAL | Status: DC

## 2013-03-17 MED ORDER — INSULIN DETEMIR 100 UNIT/ML ~~LOC~~ SOLN
15.0000 [IU] | Freq: Every day | SUBCUTANEOUS | Status: DC
  Administered 2013-03-17: 15 [IU] via SUBCUTANEOUS
  Filled 2013-03-17: qty 0.15

## 2013-03-17 MED ORDER — TOPIRAMATE 50 MG PO TABS: 50.0000 mg | ORAL_TABLET | Freq: Every day | ORAL | Status: AC

## 2013-03-17 MED ORDER — INSULIN ASPART 100 UNIT/ML ~~LOC~~ SOLN
0.0000 [IU] | Freq: Three times a day (TID) | SUBCUTANEOUS | Status: DC
  Administered 2013-03-17: 8 [IU] via SUBCUTANEOUS

## 2013-03-17 MED ORDER — PANTOPRAZOLE SODIUM 40 MG PO TBEC: 40.0000 mg | DELAYED_RELEASE_TABLET | Freq: Every day | ORAL | Status: DC

## 2013-03-17 MED ORDER — DULOXETINE HCL 30 MG PO CPEP: 30.0000 mg | ORAL_CAPSULE | Freq: Every day | ORAL | Status: DC

## 2013-03-17 MED ORDER — OXYCODONE-ACETAMINOPHEN 5-325 MG PO TABS
1.0000 | ORAL_TABLET | Freq: Four times a day (QID) | ORAL | Status: DC | PRN
  Administered 2013-03-17: 1 via ORAL
  Filled 2013-03-17: qty 1

## 2013-03-17 MED ORDER — LINAGLIPTIN 5 MG PO TABS: 5.0000 mg | ORAL_TABLET | Freq: Every day | ORAL | Status: DC

## 2013-03-17 MED ORDER — OXYCODONE-ACETAMINOPHEN 5-325 MG PO TABS: 1.0000 | ORAL_TABLET | Freq: Four times a day (QID) | ORAL | Status: DC | PRN

## 2013-03-17 NOTE — Discharge Summary (Signed)
Physician Discharge Summary  Patient ID: Stephanie Peck MRN: [id number] DOB/AGE: 55-Mar-1962 55 y.o.  Admit date: 03/05/2013 Discharge date: 03/17/2013  Problem List   Acute Respiratory Failure   OBESITY   OSA   Sepsis/ Septic Shock (resolved)   CHF, acute on chronic   DM type 2 (diabetes mellitus, type 2)   Gluteal abscess   CKD stage 3   GERD   Hx Gastric Bypass   Chronic Anemia  HPI: 53 year old female with a history of diabetes mellitus, CHF, sleep apnea, depression presented 03/05/2013 with four-day history of increasing pain and erythema on her left buttock. The patient felt that it started as a boil that has gradually worsened over the past 4 days. She has had subjective fevers and chills. The patient went to see her primary care provider on 03/02/2013. She was placed on cephalexin and Bactrim DS. There was no improvement. She followed up at her PCP office 2/5. Her PCP advised her to come to the ED. She was brought by her nephew. The patient has also been complaining of shortness of breath for the past 2 days, particularly with exertion. She denies any coughing, hemoptysis, chest discomfort. She notes leg edema, but states that this has not worsened. She has noted polydipsia and polyuria without any dysuria. The patient endorses a history of CHF, but states that she only takes furosemide when necessary. She states that she has been weighing herself regularly, and she states that her weight has not changed over the last several weeks. She uses home O2, 2L at night.    Hospital Course: In the ED, the patient was noted to have hypoxemia with oxygen saturation of 88% on room air. She improved to 94% on 2 L. The patient was given 500 cc bolus of normal saline. Her blood pressure was initially soft. It improved to 111/58. She was started on vancomycin and Zosyn. Chest x-ray showed increased interstitial markings. WBC was 25.7. She was admitted and went to OR for drainage of perirectal abcess and  returned to ICU on vent. She was treated with the usual treatment for septic shock which includes antibiotics, mechanical ventilation, IVF, and vasopressors as needed. 2/8 she was started on CRRT in the setting of acute renal failure.  She was very slow to improve and she was considered for trach/peg. She was extubated 2/13. 2/14 it was decided that she would be placed on palliative care and curative measures would be stopped. Her condition, however, began to improve and she decided that she would want to seek rehabilitation at a SNF. 2/17 she has been deemed ready for discharge to SNF.   SIGNIFICANT EVENTS / STUDIES:  2/5 CT Pelvis >>> Multiple fluid dense areas within the subcutaneous fat of left buttock, stranding in the fat extending to anus compatible with cellulitis, and focal hemorrhage in left inguinal region, adjacent soft tissue mass vs lymph node vs small hemorrhage  2/5 OR >>> I&D  2/6 TTE >>> EF 55-60%, mild AS, trivial pericardial effusion  2/14 trf to floor/ NCB/comfort rx  LINES / TUBES:  OETT 2/5 >>> 2/13  R IJ CVL 2/5 >>> 2/17 R fem HD cath 2/10 >>> 2/17 CULTURES:  2/5 Blood >>> neg  2/5 Wound >>> E.COLI  2/5 Flu PCR >>> neg  ANTIBIOTICS:  Vancomycin 2/5 >>> 2/7  Zosyn 2/5 >>> 2/8  Cipro 2/8 >> 2/14   Labs at discharge Lab Results  Component Value Date   CREATININE 2.39* 03/17/2013   BUN 72* 03/17/2013  NA 135* 03/17/2013   K 5.1 03/17/2013   CL 95* 03/17/2013   CO2 23 03/17/2013   Lab Results  Component Value Date   WBC 11.3* 03/17/2013   HGB 12.4 03/17/2013   HCT 38.5 03/17/2013   MCV 97.5 03/17/2013   PLT 525* 03/17/2013   Lab Results  Component Value Date   ALT 21 03/05/2013   AST 37 03/05/2013   ALKPHOS 122* 03/05/2013   BILITOT 0.3 03/05/2013   Lab Results  Component Value Date   INR 1.28 03/06/2013   INR 1.03 03/31/2012   INR 1.08 03/01/2012   Discharge Assessment/Planning    COPD on 2L home O2     OSA / OHS    Diastolic CHF    CKD Stage 3 Baseline creat now  2.4    GERD    DM2    Pain Management    Depression  Supplemental O2 to keep SpO2 > 92% Incentive Spirometry Aspirin 325mg  daily ACHS Accuchecks and Sliding Scale Insulin Levemir Linagliptin Topamax, Cymbalta Protonix for GERD PRN percocet for pain Palliative Care Consult at [facility]   Current radiology studies No results found.  Disposition:   SNF      Discharge Orders   Future Appointments Provider Department Dept Phone   06/19/2013 9:00 AM Candee Furbish, MD [place] [phone]   Future Orders Complete By Expires   Diet - low sodium heart healthy  As directed    Increase activity slowly  As directed        Medication List    STOP taking these medications       albuterol (5 MG/ML) 0.5% nebulizer solution  Commonly known as:  PROVENTIL     albuterol 108 (90 BASE) MCG/ACT inhaler  Commonly known as:  PROVENTIL HFA;VENTOLIN HFA     BAYER BREEZE 2 TEST Disk  Generic drug:  Glucose Blood     BD PEN NEEDLE NANO U/F 32G X 4 MM Misc  Generic drug:  Insulin Pen Needle     buPROPion 300 MG 24 hr tablet  Commonly known as:  WELLBUTRIN XL     calcium carbonate 600 MG Tabs tablet  Commonly known as:  OS-CAL     cephALEXin 500 MG capsule  Commonly known as:  KEFLEX     ferrous fumarate 325 (106 FE) MG Tabs tablet  Commonly known as:  HEMOCYTE - 106 mg FE     gabapentin 600 MG tablet  Commonly known as:  NEURONTIN     indomethacin 50 MG capsule  Commonly known as:  INDOCIN     lansoprazole 30 MG capsule  Commonly known as:  PREVACID     losartan 50 MG tablet  Commonly known as:  COZAAR     multivitamin tablet     omeprazole 40 MG capsule  Commonly known as:  PRILOSEC  Replaced by:  pantoprazole 40 MG tablet     promethazine 25 MG tablet  Commonly known as:  PHENERGAN     simvastatin 20 MG tablet  Commonly known as:  ZOCOR     sitaGLIPtin 50 MG tablet  Commonly known as:  JANUVIA  Replaced by:  linagliptin 5 MG Tabs tablet      sitaGLIPtin-metformin 50-1000 MG per tablet  Commonly known as:  JANUMET     sulfamethoxazole-trimethoprim 800-160 MG per tablet  Commonly known as:  BACTRIM DS     traMADol 50 MG tablet  Commonly known as:  ULTRAM     VITAMIN  B 12 PO      TAKE these medications       aspirin 325 MG EC tablet  Take 1 tablet (325 mg total) by mouth daily.     DULoxetine 30 MG capsule  Commonly known as:  CYMBALTA  Take 1 capsule (30 mg total) by mouth daily.     insulin aspart 100 UNIT/ML injection  Commonly known as:  novoLOG  Inject 0-15 Units into the skin 3 (three) times daily with meals.     insulin aspart 100 UNIT/ML injection  Commonly known as:  novoLOG  Inject 0-5 Units into the skin at bedtime.     insulin detemir 100 UNIT/ML injection  Commonly known as:  LEVEMIR  Inject 0.15 mLs (15 Units total) into the skin daily.     linagliptin 5 MG Tabs tablet  Commonly known as:  TRADJENTA  Take 1 tablet (5 mg total) by mouth daily.     oxyCODONE-acetaminophen 5-325 MG per tablet  Commonly known as:  PERCOCET/ROXICET  Take 1 tablet by mouth every 6 (six) hours as needed for severe pain.     pantoprazole 40 MG tablet  Commonly known as:  PROTONIX  Take 1 tablet (40 mg total) by mouth daily.     topiramate 50 MG tablet  Commonly known as:  TOPAMAX  Take 1 tablet (50 mg total) by mouth daily.          Discharged Condition: fair  Time spent on discharge greater than 40 minutes.  Vital signs at Discharge. Temp:  [96.5 F (35.8 C)-97.9 F (36.6 C)] 97.9 F (36.6 C) (02/17 0409) Pulse Rate:  [80-83] 80 (02/17 0409) Resp:  [12-20] 12 (02/17 0409) BP: (115-153)/(69-74) 115/69 mmHg (02/17 0409) SpO2:  [98 %-100 %] 98 % (02/17 0409) Office follow up Special Information or instructions.  Signed: Georgann Housekeeper, ACNP [facility] Pulmonology/Critical Care Pager [phone] or [phone]    Agree with above. I have reviewed her prior meds and resumed those that still seem  relevant as reflected above   Merton Border, MD ; [facility] service Mobile [phone].  After 5:30 PM or weekends, call [phone]

## 2013-03-17 NOTE — Progress Notes (Signed)
Pt d/c'd to SNF, [facility] transport picked pt up, with belongings. Copy of d/c instructions placed in d/c packet taken by transporters to SNF.

## 2013-03-17 NOTE — Clinical Social Work Placement (Signed)
Clinical Social Work Department CLINICAL SOCIAL WORK PLACEMENT NOTE 03/17/2013  Patient:  TONESHA, TSOU  Account Number:  [id number] Admit date:  03/05/2013  Clinical Social Worker:  Ky Barban, Latanya Presser  Date/time:  03/15/2013 12:57 PM  Clinical Social Work is seeking post-discharge placement for this patient at the following level of care:   SKILLED NURSING   (*CSW will update this form in Epic as items are completed)     Patient/family provided with [facility] Department of Clinical Social Work's list of facilities offering this level of care within the geographic area requested by the patient (or if unable, by the patient's family).  03/15/2013  Patient/family informed of their freedom to choose among providers that offer the needed level of care, that participate in Medicare, Medicaid or managed care program needed by the patient, have an available bed and are willing to accept the patient.    Patient/family informed of [facility]' ownership interest in [facility], as well as of the fact that they are under no obligation to receive care at this facility.  PASARR submitted to EDS on 03/15/2013 PASARR number received from EDS on 03/15/2013  FL2 transmitted to all facilities in geographic area requested by pt/family on  03/15/2013 FL2 transmitted to all facilities within larger geographic area on   Patient informed that his/her managed care company has contracts with or will negotiate with  certain facilities, including the following:     Patient/family informed of bed offers received:  03/16/2013 Patient chooses bed at [place] Physician recommends and patient chooses bed at    Patient to be transferred to [place] on   Patient to be transferred to facility by   The following physician request were entered in Epic:   Additional Comments: CSW met with family and patient a few times on 03/16/13. Family and  patient are considering SNF placement at [facility] with Palliative Services following. CSW will facilitate DC once DC order is received. CSW notes that PMT is planning to meet with patient and family.  UPDATE 03/17/13: Per MD patient ready for DC to [facility]. RN, patient, family, and facility notified of DC. Husband completed paperwork at facility. RN given number for report. Ambulance transport requested for patient for 4:30PM. DC packet left on chart. CSW signing off at this time.   Liz Beach, [place], [place], [phone]

## 2013-03-17 NOTE — Progress Notes (Signed)
PULMONARY / CRITICAL CARE MEDICINE  Name: Stephanie Peck MRN: [id number] DOB: 07/06/1960    ADMISSION DATE: 03/05/2013  CONSULTATION DATE: 03/05/2013   REFERRING MD : TRH  CHIEF COMPLAINT: Acute respiratory distress  BRIEF PATIENT DESCRIPTION: 53 y/o morbidly obese with OSA, COPDand DM admitted 2/5 with left buttock abscess.  SIGNIFICANT EVENTS / STUDIES:  2/5  CT Pelvis >>> Multiple fluid dense areas within the subcutaneous fat of left buttock, stranding in the fat extending to anus compatible with cellulitis, and focal hemorrhage in left inguinal region, adjacent soft tissue mass vs lymph node vs small hemorrhage 2/5  OR >>> I&D 2/6 TTE >>> EF 55-60%,  mild AS, trivial pericardial effusion 2/14 trf to floor/ NCB/comfort rx  2/16 Awake and alert and wants rehab.  LINES / TUBES:  OETT 2/5 >>> 2/13 R IJ CVL 2/5 >>  R fem  HD cath 2/10 >>   CULTURES:  2/5  Blood >>> neg 2/5  Wound >>> E.COLI 2/5  Flu PCR >>> neg  ANTIBIOTICS:  Vancomycin 2/5 >>> 2/7 Zosyn 2/5 >>> 2/8 Cipro 2/8 >> 2/14  INTERVAL HISTORY:   Patient states that she feels "good" and want to go to rehab facility to try to get better.  VITAL SIGNS: Temp:  [96.5 F (35.8 C)-97.9 F (36.6 C)] 97.9 F (36.6 C) (02/17 0409) Pulse Rate:  [80-83] 80 (02/17 0409) Resp:  [12-20] 12 (02/17 0409) BP: (115-153)/(69-74) 115/69 mmHg (02/17 0409) SpO2:  [98 %-100 %] 98 % (02/17 0409)  HEMODYNAMICS:    VENTILATOR SETTINGS:    INTAKE / OUTPUT: Intake/Output     02/16 0701 - 02/17 0700 02/17 0701 - 02/18 0700   Urine (mL/kg/hr) 1900 (0.7)    Stool 2 (0)    Total Output 1902     Net -1902           PHYSICAL EXAMINATION: General:  Awake and alert, NAD Neuro: diffusely weak, no focal deficits, cognition intact Cardiovascular:  RRR, no m/r/g Lungs:  Clear anteriorly Abdomen:  Soft, nontender, bowel sounds diminished Ext: symmetric edema Skin:  Surgical dressing intact, rt buttock, sacral pad  noted  LABS:  CBC I have reviewed all of today's lab results. Relevant abnormalities are discussed in the A/P section  CXR: None today  ASSESSMENT / PLAN:  PULMONARY  A:  Acute respiratory failure, resolved COPD on 2L home O2  OSA / OHS P:  Wants rehab, on O2  CARDIOVASCULAR  A:  Acute diastolic congestive heart failure Sepsis, improving P:     RENAL  A:  AKI CKD, stage III Poor candidate for long term HD P:  If decision is for no more HD, remove HD catheter    GASTROINTESTINAL  A:  GERD  Hx gastric bypass  GI Px is not indicated P:  Begin CHO modified diet    HEMATOLOGIC  A:  Chronic anemia  P:  VTE Px: SCDs  INFECTIOUS  A:  Left buttock abscess, s/p I&D 2/5 Perirectal cellulitis P:  Micro and abx as above  ENDOCRINE  A:  Diabetes  Hyperglycemia P:  ACHS SSI  NEUROLOGIC  A:  Pain  Acute encephalopathy (resolved 2/16) Depression / anxiety  Pain P:  Cont PRN MSO4   Global:  Palliative Care to see today to determine goals of care. I suspect she will want to go to SNF for rehabilitation. Will f/u and start appropriate medications after their evaluation.     Georgann Housekeeper, ACNP [facility] Pulmonology/Critical Care Pager [phone]  or [phone]   Merton Border, MD ; [facility] service Mobile [phone].  After 5:30 PM or weekends, call [phone]

## 2013-03-18 DIAGNOSIS — R069 Unspecified abnormalities of breathing: Secondary | ICD-10-CM | POA: Diagnosis not present

## 2013-03-18 DIAGNOSIS — E119 Type 2 diabetes mellitus without complications: Secondary | ICD-10-CM | POA: Diagnosis not present

## 2013-03-18 DIAGNOSIS — N183 Chronic kidney disease, stage 3 unspecified: Secondary | ICD-10-CM | POA: Diagnosis not present

## 2013-03-18 DIAGNOSIS — K219 Gastro-esophageal reflux disease without esophagitis: Secondary | ICD-10-CM | POA: Diagnosis not present

## 2013-03-19 DIAGNOSIS — K219 Gastro-esophageal reflux disease without esophagitis: Secondary | ICD-10-CM | POA: Diagnosis not present

## 2013-03-19 DIAGNOSIS — N183 Chronic kidney disease, stage 3 unspecified: Secondary | ICD-10-CM | POA: Diagnosis not present

## 2013-03-19 DIAGNOSIS — E119 Type 2 diabetes mellitus without complications: Secondary | ICD-10-CM | POA: Diagnosis not present

## 2013-03-19 DIAGNOSIS — R069 Unspecified abnormalities of breathing: Secondary | ICD-10-CM | POA: Diagnosis not present

## 2013-03-23 ENCOUNTER — Encounter (HOSPITAL_BASED_OUTPATIENT_CLINIC_OR_DEPARTMENT_OTHER): Payer: Medicare Other | Attending: Plastic Surgery

## 2013-03-23 DIAGNOSIS — L89309 Pressure ulcer of unspecified buttock, unspecified stage: Secondary | ICD-10-CM | POA: Insufficient documentation

## 2013-03-23 DIAGNOSIS — L98499 Non-pressure chronic ulcer of skin of other sites with unspecified severity: Secondary | ICD-10-CM | POA: Diagnosis not present

## 2013-03-23 DIAGNOSIS — L8990 Pressure ulcer of unspecified site, unspecified stage: Secondary | ICD-10-CM | POA: Insufficient documentation

## 2013-03-24 NOTE — Progress Notes (Signed)
[facility]  NAME:  RONNY, RUDDELL                     ACCOUNT NO.:  MEDICAL RECORD NO.:  [id number]      DATE OF BIRTH:  Apr 08, 1960  PHYSICIAN:  Theodoro Kos, DO       VISIT DATE:  03/23/2013                                  OFFICE VISIT   SUBJECTIVE:  The patient is a 53 year old female who is here for followup on her ulcers.  She was admitted and underwent irrigation, debridement, and evacuation of perirectal abscess by General Surgery a couple of weeks ago.  She is now in [place].  She states that the dressing is not changed daily.  She has gotten one shower in a week and she is getting a small cup of protein twice a day.  This is of course concerning.  OBJECTIVE:  GENERAL:  She is alert, oriented, cooperative, very pleasant.  Overall, she is doing okay. LUNGS:  Her breathing is unlabored. CARDIOVASCULAR:  Her heart rate is regular.  ASSESSMENT AND PLAN:  She is a little bit pale.  We are going to check a pre-albumin.  I debrided the sacral ulcer, likely related to pressure. The perirectal ulcer does look clean.  The sizes are noted in the chart. At this point, I recommend wet-to-dry dressings on the perirectal abscess twice a day, Santyl on the sacral ulcer, protein, offloading, and multivitamin, vitamin C, and zinc, and follow up in 1 week.     Theodoro Kos, DO     CS/MEDQ  D:  03/23/2013  T:  03/24/2013  Job:  [id number]

## 2013-03-28 DIAGNOSIS — L89109 Pressure ulcer of unspecified part of back, unspecified stage: Secondary | ICD-10-CM | POA: Diagnosis not present

## 2013-03-28 DIAGNOSIS — L89309 Pressure ulcer of unspecified buttock, unspecified stage: Secondary | ICD-10-CM | POA: Diagnosis not present

## 2013-03-30 ENCOUNTER — Encounter (HOSPITAL_BASED_OUTPATIENT_CLINIC_OR_DEPARTMENT_OTHER): Payer: Medicare Other | Attending: Plastic Surgery

## 2013-03-30 DIAGNOSIS — L98499 Non-pressure chronic ulcer of skin of other sites with unspecified severity: Secondary | ICD-10-CM | POA: Diagnosis not present

## 2013-03-31 DIAGNOSIS — L89109 Pressure ulcer of unspecified part of back, unspecified stage: Secondary | ICD-10-CM | POA: Diagnosis not present

## 2013-03-31 DIAGNOSIS — L89309 Pressure ulcer of unspecified buttock, unspecified stage: Secondary | ICD-10-CM | POA: Diagnosis not present

## 2013-03-31 NOTE — Progress Notes (Signed)
[facility]  Stephanie Peck, Stephanie Peck                ACCOUNT NO.:  [id number]  MEDICAL RECORD NO.:  [id number]      DATE OF BIRTH:  1960-02-21  PHYSICIAN:  Theodoro Kos, DO       VISIT DATE:  03/30/2013                                  OFFICE VISIT   HISTORY:  The patient is a 53 year old female who is here for followup on her sacral ulcers.  She is doing better.  She is at home now.  She does have help.  Home Health is coming as well.  She is doing wet to dries on the left one and the central one on Santyl.  There are no change in her medications.  SOCIAL HISTORY:  Changed as stated above.  REVIEW OF SYSTEMS:  Negative.  Pre-albumin was reported as not having been done at the facility.  PHYSICAL EXAMINATION:  She is alert, oriented, in very good spirits, very happy to be home.  Overall, she feels like she is doing better. She does have an air mattress bed and is requesting some additional pads for the bony point.  Breathing is unlabored.  Her heart rate is regular.  ASSESSMENT:  The wounds are; the left wound is clean, the middle wound was debrided again today of the fibrous tissue.  We will continue with wet to dries on the left and Santyl on the central part.  We will also check a prealbumin.  We wrote a script for the pads and Magic mouthwash. She has some ulcers in her mouth and she is working on the protein as well.  Offloading is also very important and will see her back in a week.     Theodoro Kos, DO     CS/MEDQ  D:  03/30/2013  T:  03/31/2013  Job:  [id number]

## 2013-04-01 LAB — FUNGUS CULTURE W SMEAR: FUNGAL SMEAR: NONE SEEN

## 2013-04-02 DIAGNOSIS — E1149 Type 2 diabetes mellitus with other diabetic neurological complication: Secondary | ICD-10-CM | POA: Diagnosis not present

## 2013-04-02 DIAGNOSIS — N183 Chronic kidney disease, stage 3 unspecified: Secondary | ICD-10-CM | POA: Diagnosis not present

## 2013-04-02 DIAGNOSIS — Z6839 Body mass index (BMI) 39.0-39.9, adult: Secondary | ICD-10-CM | POA: Diagnosis not present

## 2013-04-02 DIAGNOSIS — E669 Obesity, unspecified: Secondary | ICD-10-CM | POA: Diagnosis not present

## 2013-04-02 DIAGNOSIS — E1142 Type 2 diabetes mellitus with diabetic polyneuropathy: Secondary | ICD-10-CM | POA: Diagnosis not present

## 2013-04-03 DIAGNOSIS — L89109 Pressure ulcer of unspecified part of back, unspecified stage: Secondary | ICD-10-CM | POA: Diagnosis not present

## 2013-04-03 DIAGNOSIS — L89309 Pressure ulcer of unspecified buttock, unspecified stage: Secondary | ICD-10-CM | POA: Diagnosis not present

## 2013-04-06 DIAGNOSIS — L98499 Non-pressure chronic ulcer of skin of other sites with unspecified severity: Secondary | ICD-10-CM | POA: Diagnosis not present

## 2013-04-07 DIAGNOSIS — L89109 Pressure ulcer of unspecified part of back, unspecified stage: Secondary | ICD-10-CM | POA: Diagnosis not present

## 2013-04-07 DIAGNOSIS — L89309 Pressure ulcer of unspecified buttock, unspecified stage: Secondary | ICD-10-CM | POA: Diagnosis not present

## 2013-04-07 NOTE — Progress Notes (Signed)
[facility]  NAMEKEYSHAWNA, PROUSE                ACCOUNT NO.:  [id number]  MEDICAL RECORD NO.:  [id number]      DATE OF BIRTH:  April 02, 1960  PHYSICIAN:  Theodoro Kos, DO       VISIT DATE:  04/06/2013                                  OFFICE VISIT   HISTORY OF PRESENT ILLNESS:  The patient is a 53 year old female who is here for followup on her sacral and ischial ulcers.  Overall, she is doing much better.  They are clean, filling in a little bit less eschar and fibrous tissue.  There is no change in her medications.  SOCIAL HISTORY:  She has good family support and is at home.  REVIEW OF SYSTEMS:  Negative.  PHYSICAL EXAMINATION:  GENERAL:  She is alert, oriented, cooperative, not in any acute distress.  She is pleasant.  EYES:  Pupils are equal. ABDOMEN:  Soft. LUNGS:  Breathing is unlabored. CARDIAC:  Heart rate is regular.  The wounds are clean.  We will continue with the Santyl on the upper one, wet-to-dry on the lower one.  We will apply for the [facility] and hopefully have some ACell in the next week or so that we can put on under the VAC.  I think this will make a big difference.  In the meantime, continue with multivitamins, vitamin C, protein, and offloading.     Theodoro Kos, DO     CS/MEDQ  D:  04/06/2013  T:  04/07/2013  Job:  [id number]

## 2013-04-08 DIAGNOSIS — L89309 Pressure ulcer of unspecified buttock, unspecified stage: Secondary | ICD-10-CM | POA: Diagnosis not present

## 2013-04-08 DIAGNOSIS — L89109 Pressure ulcer of unspecified part of back, unspecified stage: Secondary | ICD-10-CM | POA: Diagnosis not present

## 2013-04-10 DIAGNOSIS — L89309 Pressure ulcer of unspecified buttock, unspecified stage: Secondary | ICD-10-CM | POA: Diagnosis not present

## 2013-04-10 DIAGNOSIS — L89109 Pressure ulcer of unspecified part of back, unspecified stage: Secondary | ICD-10-CM | POA: Diagnosis not present

## 2013-04-13 DIAGNOSIS — L98499 Non-pressure chronic ulcer of skin of other sites with unspecified severity: Secondary | ICD-10-CM | POA: Diagnosis not present

## 2013-04-13 NOTE — Progress Notes (Signed)
[facility]  NAME:  Stephanie Peck, Stephanie Peck                ACCOUNT NO.:  [id number]  MEDICAL RECORD NO.:  [id number]      DATE OF BIRTH:  09/30/1960  PHYSICIAN:  Theodoro Kos, DO            VISIT DATE:                                  OFFICE VISIT   HISTORY OF PRESENT ILLNESS:  The patient is a 53 year old female who is here with her daughter for followup on her sacral and ischial ulcers. Overall, she is doing very well.  The VAC has been placed, seems to be improving there.  Fibrous tissue on the sacral area has improved.  She is using Santyl and collagen on this area.  There is improvement in the granulation tissue, and she is trying to improve on her protein.  REVIEW OF SYSTEMS:  Otherwise negative.  SOCIAL HISTORY:  No change in her social history.  The pre-albumin was not checked, but she will have that done this week.  PHYSICAL EXAMINATION:  GENERAL:  On exam, she is alert, oriented, cooperative, very pleasant, overall doing very well.  She is alert. HEENT:  Pupils are equal. LUNGS:  Her breathing is unlabored. HEART:  The heart rate and upper extremity pulses are strong. ABDOMEN:  Soft. SKIN:  The wounds are noted on the chart with sizes and overall, there is good improvement.  PLAN:  Continue with protein, vitamins, offloading, VAC.  We will add collagen under the VAC and continue with the Santyl and collagen on the sacral ulcer.  We will have the pre-albumin checked and we will see her back in a week.     Theodoro Kos, DO     CS/MEDQ  D:  04/13/2013  T:  04/13/2013  Job:  [id number]

## 2013-04-15 DIAGNOSIS — L89109 Pressure ulcer of unspecified part of back, unspecified stage: Secondary | ICD-10-CM | POA: Diagnosis not present

## 2013-04-15 DIAGNOSIS — L98499 Non-pressure chronic ulcer of skin of other sites with unspecified severity: Secondary | ICD-10-CM | POA: Diagnosis not present

## 2013-04-15 DIAGNOSIS — L89309 Pressure ulcer of unspecified buttock, unspecified stage: Secondary | ICD-10-CM | POA: Diagnosis not present

## 2013-04-16 DIAGNOSIS — G8929 Other chronic pain: Secondary | ICD-10-CM | POA: Diagnosis not present

## 2013-04-16 DIAGNOSIS — IMO0002 Reserved for concepts with insufficient information to code with codable children: Secondary | ICD-10-CM | POA: Diagnosis not present

## 2013-04-16 DIAGNOSIS — G589 Mononeuropathy, unspecified: Secondary | ICD-10-CM | POA: Diagnosis not present

## 2013-04-16 DIAGNOSIS — M62838 Other muscle spasm: Secondary | ICD-10-CM | POA: Diagnosis not present

## 2013-04-16 DIAGNOSIS — E119 Type 2 diabetes mellitus without complications: Secondary | ICD-10-CM | POA: Diagnosis not present

## 2013-04-16 DIAGNOSIS — G4700 Insomnia, unspecified: Secondary | ICD-10-CM | POA: Diagnosis not present

## 2013-04-16 DIAGNOSIS — N189 Chronic kidney disease, unspecified: Secondary | ICD-10-CM | POA: Diagnosis not present

## 2013-04-17 DIAGNOSIS — L89309 Pressure ulcer of unspecified buttock, unspecified stage: Secondary | ICD-10-CM | POA: Diagnosis not present

## 2013-04-17 DIAGNOSIS — L89109 Pressure ulcer of unspecified part of back, unspecified stage: Secondary | ICD-10-CM | POA: Diagnosis not present

## 2013-04-17 LAB — AFB CULTURE WITH SMEAR (NOT AT ARMC): Acid Fast Smear: NONE SEEN

## 2013-04-20 DIAGNOSIS — L98499 Non-pressure chronic ulcer of skin of other sites with unspecified severity: Secondary | ICD-10-CM | POA: Diagnosis not present

## 2013-04-20 NOTE — Progress Notes (Signed)
[facility]  NAMEBUFFI, EWTON                ACCOUNT NO.:  [id number]  MEDICAL RECORD NO.:  [id number]      DATE OF BIRTH:  09-25-1960  PHYSICIAN:  Theodoro Kos, DO       VISIT DATE:  04/20/2013                                  OFFICE VISIT   The patient is a 53 year old female who is here for followup on her sacral ulcer.  The ischial ulcer is still present as well but much better.  The depth is a little bit more on the ischial ulcer but the width is markedly improved.  It does look clean.  There does not appear to be any sign of infection.  There has been no change in her medications or social history.  On exam, she is alert, oriented, cooperative, not in any distress, very pleasant.  Pre-albumin at 22, so that is encouraging.  Continue with the VAC, will put collagen underneath.  We talked to her about being really diligent to stay off it and we will see her back in a week.     Theodoro Kos, DO     CS/MEDQ  D:  04/20/2013  T:  04/20/2013  Job:  [id number]

## 2013-04-22 DIAGNOSIS — L89109 Pressure ulcer of unspecified part of back, unspecified stage: Secondary | ICD-10-CM | POA: Diagnosis not present

## 2013-04-22 DIAGNOSIS — L89309 Pressure ulcer of unspecified buttock, unspecified stage: Secondary | ICD-10-CM | POA: Diagnosis not present

## 2013-04-24 DIAGNOSIS — L89309 Pressure ulcer of unspecified buttock, unspecified stage: Secondary | ICD-10-CM | POA: Diagnosis not present

## 2013-04-24 DIAGNOSIS — L89109 Pressure ulcer of unspecified part of back, unspecified stage: Secondary | ICD-10-CM | POA: Diagnosis not present

## 2013-04-27 DIAGNOSIS — L98499 Non-pressure chronic ulcer of skin of other sites with unspecified severity: Secondary | ICD-10-CM | POA: Diagnosis not present

## 2013-04-28 ENCOUNTER — Encounter: Payer: Self-pay | Admitting: Cardiology

## 2013-04-28 NOTE — Progress Notes (Signed)
[facility]  NAMEIOANA, LOUKS                ACCOUNT NO.:  [id number]  MEDICAL RECORD NO.:  [id number]      DATE OF BIRTH:  May 25, 1960  PHYSICIAN:  Theodoro Kos, DO       VISIT DATE:  04/27/2013                                  OFFICE VISIT   HISTORY OF PRESENT ILLNESS:  The patient is a 53 year old female who is here for followup on her sacral and ischial ulcers.  She has been using VAC.  Overall, she looks much better than she has in the past, both in her overall appearance and in the wound.  MEDICATIONS:  There has been no change in her medications.  SOCIAL HISTORY:  Unchanged as well.  PHYSICAL EXAMINATION:  She is alert, oriented, cooperative, very pleasant as usual.  The wounds are significantly improved smaller than they have been and recommend continuing with the VAC, we will put collagen underneath. Offloading is still important.  Multivitamin, vitamin C, zinc, and, protein which she states she is doing, and the VAC may only be used for another week or 2 because I think her wounds are going to be smaller than the actual tract so we will see her back in 1 week.     Theodoro Kos, DO     CS/MEDQ  D:  04/27/2013  T:  04/27/2013  Job:  [id number]

## 2013-04-29 DIAGNOSIS — L89309 Pressure ulcer of unspecified buttock, unspecified stage: Secondary | ICD-10-CM | POA: Diagnosis not present

## 2013-04-29 DIAGNOSIS — L89109 Pressure ulcer of unspecified part of back, unspecified stage: Secondary | ICD-10-CM | POA: Diagnosis not present

## 2013-04-30 DIAGNOSIS — L89109 Pressure ulcer of unspecified part of back, unspecified stage: Secondary | ICD-10-CM | POA: Diagnosis not present

## 2013-04-30 DIAGNOSIS — L89309 Pressure ulcer of unspecified buttock, unspecified stage: Secondary | ICD-10-CM | POA: Diagnosis not present

## 2013-05-04 ENCOUNTER — Encounter (HOSPITAL_BASED_OUTPATIENT_CLINIC_OR_DEPARTMENT_OTHER): Payer: Medicare Other | Attending: Plastic Surgery

## 2013-05-04 DIAGNOSIS — L89109 Pressure ulcer of unspecified part of back, unspecified stage: Secondary | ICD-10-CM | POA: Insufficient documentation

## 2013-05-04 DIAGNOSIS — L89309 Pressure ulcer of unspecified buttock, unspecified stage: Secondary | ICD-10-CM | POA: Diagnosis not present

## 2013-05-04 DIAGNOSIS — L8990 Pressure ulcer of unspecified site, unspecified stage: Secondary | ICD-10-CM | POA: Diagnosis not present

## 2013-05-05 NOTE — Progress Notes (Signed)
[facility]  NAMEMAO, LOCKNER                ACCOUNT NO.:  [id number]  MEDICAL RECORD NO.:  [id number]      DATE OF BIRTH:  [birth year]  PHYSICIAN:  Theodoro Kos, DO       VISIT DATE:  05/04/2013                                  OFFICE VISIT   HISTORY OF PRESENT ILLNESS:  The patient is a 53 year old female who is here for followup on her sacral ischial ulcer.  She is doing extremely well with marked improvement, all noted in nurse's notes.  She has been using the negative pressure wound dressing and requested to be switched to the __________ which should arrive today.  She is working on a multivitamin, vitamin C, zinc, and protein __________.  REVIEW OF SYSTEMS:  Negative.  PHYSICAL EXAMINATION:  GENERAL:  Alert, oriented, cooperative not in any acute distress.  She is pleasant. HEENT:  Pupils are equal.  Extraocular muscles are intact. EXTREMITIES:  The wound is markedly improved.  The periwound area is much better as well.  We will put collagen on today and then we will do the VAC.  We will also order the white sponge and home health will change it 3 times a week.  We will see her back in 2 weeks.     Theodoro Kos, DO     CS/MEDQ  D:  05/04/2013  T:  05/05/2013  Job:  [id number]

## 2013-05-06 DIAGNOSIS — L89109 Pressure ulcer of unspecified part of back, unspecified stage: Secondary | ICD-10-CM | POA: Diagnosis not present

## 2013-05-06 DIAGNOSIS — L89309 Pressure ulcer of unspecified buttock, unspecified stage: Secondary | ICD-10-CM | POA: Diagnosis not present

## 2013-05-08 DIAGNOSIS — L89309 Pressure ulcer of unspecified buttock, unspecified stage: Secondary | ICD-10-CM | POA: Diagnosis not present

## 2013-05-08 DIAGNOSIS — L89109 Pressure ulcer of unspecified part of back, unspecified stage: Secondary | ICD-10-CM | POA: Diagnosis not present

## 2013-05-11 DIAGNOSIS — L89309 Pressure ulcer of unspecified buttock, unspecified stage: Secondary | ICD-10-CM | POA: Diagnosis not present

## 2013-05-11 DIAGNOSIS — L89109 Pressure ulcer of unspecified part of back, unspecified stage: Secondary | ICD-10-CM | POA: Diagnosis not present

## 2013-05-13 DIAGNOSIS — L89109 Pressure ulcer of unspecified part of back, unspecified stage: Secondary | ICD-10-CM | POA: Diagnosis not present

## 2013-05-13 DIAGNOSIS — L89309 Pressure ulcer of unspecified buttock, unspecified stage: Secondary | ICD-10-CM | POA: Diagnosis not present

## 2013-05-14 DIAGNOSIS — G8929 Other chronic pain: Secondary | ICD-10-CM | POA: Diagnosis not present

## 2013-05-14 DIAGNOSIS — I5032 Chronic diastolic (congestive) heart failure: Secondary | ICD-10-CM | POA: Diagnosis not present

## 2013-05-14 DIAGNOSIS — M62838 Other muscle spasm: Secondary | ICD-10-CM | POA: Diagnosis not present

## 2013-05-14 DIAGNOSIS — I10 Essential (primary) hypertension: Secondary | ICD-10-CM | POA: Diagnosis not present

## 2013-05-15 DIAGNOSIS — L89109 Pressure ulcer of unspecified part of back, unspecified stage: Secondary | ICD-10-CM | POA: Diagnosis not present

## 2013-05-15 DIAGNOSIS — L89309 Pressure ulcer of unspecified buttock, unspecified stage: Secondary | ICD-10-CM | POA: Diagnosis not present

## 2013-05-18 DIAGNOSIS — L8990 Pressure ulcer of unspecified site, unspecified stage: Secondary | ICD-10-CM | POA: Diagnosis not present

## 2013-05-18 DIAGNOSIS — L89109 Pressure ulcer of unspecified part of back, unspecified stage: Secondary | ICD-10-CM | POA: Diagnosis not present

## 2013-05-18 NOTE — Progress Notes (Signed)
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  She is doing extremely well. She has been using the VAC over the past several weeks.  REVIEW OF SYSTEMS:  Negative.  SOCIAL HISTORY:  Unchanged.  PHYSICAL EXAMINATION:  GENERAL:  She is alert, oriented, cooperative, very pleasant. HEENT:  Pupils are equal.  Extraocular muscles are intact.  No cervical lymphadenopathy. LUNGS:  Her breathing is unlabored. HEART:  Her heart rate is regular.  The wounds are much smaller in size.  The size is noted at the chart but significant improvement overall, less in depth and overall size.  She is actually small enough that the [facility] is too big, so we will do collagen on the upper wound and alginate on the lower wound with changing every other day, and we will see her back in 2 weeks.  She can shower prior to the dressing change.     Theodoro Kos, DO     CS/MEDQ  D:  05/18/2013  T:  05/18/2013  Job:  [id number]

## 2013-05-20 DIAGNOSIS — L89109 Pressure ulcer of unspecified part of back, unspecified stage: Secondary | ICD-10-CM | POA: Diagnosis not present

## 2013-05-20 DIAGNOSIS — L89309 Pressure ulcer of unspecified buttock, unspecified stage: Secondary | ICD-10-CM | POA: Diagnosis not present

## 2013-05-22 DIAGNOSIS — L89309 Pressure ulcer of unspecified buttock, unspecified stage: Secondary | ICD-10-CM | POA: Diagnosis not present

## 2013-05-22 DIAGNOSIS — L89109 Pressure ulcer of unspecified part of back, unspecified stage: Secondary | ICD-10-CM | POA: Diagnosis not present

## 2013-05-26 DIAGNOSIS — L89309 Pressure ulcer of unspecified buttock, unspecified stage: Secondary | ICD-10-CM | POA: Diagnosis not present

## 2013-05-26 DIAGNOSIS — L89109 Pressure ulcer of unspecified part of back, unspecified stage: Secondary | ICD-10-CM | POA: Diagnosis not present

## 2013-05-27 DIAGNOSIS — L89109 Pressure ulcer of unspecified part of back, unspecified stage: Secondary | ICD-10-CM | POA: Diagnosis not present

## 2013-05-27 DIAGNOSIS — L89309 Pressure ulcer of unspecified buttock, unspecified stage: Secondary | ICD-10-CM | POA: Diagnosis not present

## 2013-05-28 DIAGNOSIS — L89309 Pressure ulcer of unspecified buttock, unspecified stage: Secondary | ICD-10-CM | POA: Diagnosis not present

## 2013-05-28 DIAGNOSIS — L89109 Pressure ulcer of unspecified part of back, unspecified stage: Secondary | ICD-10-CM | POA: Diagnosis not present

## 2013-06-01 ENCOUNTER — Encounter (HOSPITAL_BASED_OUTPATIENT_CLINIC_OR_DEPARTMENT_OTHER): Payer: Medicare Other | Attending: Plastic Surgery

## 2013-06-01 DIAGNOSIS — L8992 Pressure ulcer of unspecified site, stage 2: Secondary | ICD-10-CM | POA: Insufficient documentation

## 2013-06-01 DIAGNOSIS — L89309 Pressure ulcer of unspecified buttock, unspecified stage: Secondary | ICD-10-CM | POA: Diagnosis not present

## 2013-06-02 NOTE — Progress Notes (Signed)
[facility]  NAMEPALYN, SCRIMA                ACCOUNT NO.:  [id number]  MEDICAL RECORD NO.:  [id number]      DATE OF BIRTH:  [birth year]  PHYSICIAN:  Theodoro Kos, DO       VISIT DATE:  06/01/2013                                  OFFICE VISIT   HISTORY OF PRESENT ILLNESS:  The patient is a 53 year old female who had an ischial and sacral ulcer due to pressure.  She has done extremely well.  She was using silver alginate and collagen.  She has been increasing her protein and taking her vitamins.  There has been no change in her medications or social history.  REVIEW OF SYSTEMS:  Negative.  PHYSICAL EXAMINATION:  GENERAL:  She is alert, cooperative, very pleasant, and very pleased with her progress. HEENT:  Her pupils are equal. RESPIRATORY:  Her breathing is unlabored. CARDIOVASCULAR:  Her heart rate is regular. EXTREMITIES:  Her pulses are strong. ABDOMEN:  Large.  ASSESSMENT:  The ischial wound has completely closed and healed and the sacral ulcer is smaller.  There is a little bit of hypergranulation and silver nitrate stick was used for that.  We will use collagen, continue to offload, protein, multivitamin, vitamin C, zinc, and see her back in 2 weeks, and she can send the VAC back.     Theodoro Kos, DO     CS/MEDQ  D:  06/01/2013  T:  06/02/2013  Job:  [id number]

## 2013-06-03 DIAGNOSIS — L89309 Pressure ulcer of unspecified buttock, unspecified stage: Secondary | ICD-10-CM | POA: Diagnosis not present

## 2013-06-03 DIAGNOSIS — L89109 Pressure ulcer of unspecified part of back, unspecified stage: Secondary | ICD-10-CM | POA: Diagnosis not present

## 2013-06-05 DIAGNOSIS — L89109 Pressure ulcer of unspecified part of back, unspecified stage: Secondary | ICD-10-CM | POA: Diagnosis not present

## 2013-06-05 DIAGNOSIS — L89309 Pressure ulcer of unspecified buttock, unspecified stage: Secondary | ICD-10-CM | POA: Diagnosis not present

## 2013-06-10 DIAGNOSIS — L89109 Pressure ulcer of unspecified part of back, unspecified stage: Secondary | ICD-10-CM | POA: Diagnosis not present

## 2013-06-10 DIAGNOSIS — L89309 Pressure ulcer of unspecified buttock, unspecified stage: Secondary | ICD-10-CM | POA: Diagnosis not present

## 2013-06-11 DIAGNOSIS — N182 Chronic kidney disease, stage 2 (mild): Secondary | ICD-10-CM | POA: Diagnosis not present

## 2013-06-11 DIAGNOSIS — I129 Hypertensive chronic kidney disease with stage 1 through stage 4 chronic kidney disease, or unspecified chronic kidney disease: Secondary | ICD-10-CM | POA: Diagnosis not present

## 2013-06-15 DIAGNOSIS — L89309 Pressure ulcer of unspecified buttock, unspecified stage: Secondary | ICD-10-CM | POA: Diagnosis not present

## 2013-06-15 DIAGNOSIS — L8992 Pressure ulcer of unspecified site, stage 2: Secondary | ICD-10-CM | POA: Diagnosis not present

## 2013-06-17 DIAGNOSIS — L89109 Pressure ulcer of unspecified part of back, unspecified stage: Secondary | ICD-10-CM | POA: Diagnosis not present

## 2013-06-17 DIAGNOSIS — L89309 Pressure ulcer of unspecified buttock, unspecified stage: Secondary | ICD-10-CM | POA: Diagnosis not present

## 2013-06-17 NOTE — Progress Notes (Signed)
[facility]  NAME:  Stephanie Peck, Stephanie Peck                     ACCOUNT NO.:  MEDICAL RECORD NO.:  [id number]      DATE OF BIRTH:  22-Nov-1960  PHYSICIAN:  Theodoro Kos, DO       VISIT DATE:  06/15/2013                                  OFFICE VISIT   HISTORY OF PRESENT ILLNESS:  The patient is a 53 year old female who is here for a followup on her sacral ischial ulcer.  The ischial ulcer has completely healed and closed up.  She has wonderful progress with this. The sacral ulcer is smaller and less deep but still present.  The patient states she often sits on the toilet for extended periods of time.  This may be creating a bit of tension contributing to the lack of closure of the wound.  She is still taking protein.  REVIEW OF SYSTEMS:  Negative.  PHYSICAL EXAMINATION:  On exam, she is alert, oriented, cooperative, not in any distress.  She is very pleasant as usual.  Pupils equal. Breathing is unlabored.  Heart rate is regular.  The wound is described as above and the ischial ulcer is completely healed.  ASSESSMENT AND PLAN:  We will continue with the collagen with alginate, and we will have her follow up in 2 weeks.     Theodoro Kos, DO     CS/MEDQ  D:  06/15/2013  T:  06/15/2013  Job:  [id number]

## 2013-06-18 ENCOUNTER — Encounter: Payer: Self-pay | Admitting: Cardiology

## 2013-06-18 ENCOUNTER — Ambulatory Visit (INDEPENDENT_AMBULATORY_CARE_PROVIDER_SITE_OTHER): Payer: Medicare Other | Admitting: Cardiology

## 2013-06-18 VITALS — BP 130/70 | HR 84 | Ht 67.0 in | Wt 271.4 lb

## 2013-06-18 DIAGNOSIS — I509 Heart failure, unspecified: Secondary | ICD-10-CM | POA: Diagnosis not present

## 2013-06-18 DIAGNOSIS — E669 Obesity, unspecified: Secondary | ICD-10-CM | POA: Diagnosis not present

## 2013-06-18 DIAGNOSIS — I200 Unstable angina: Secondary | ICD-10-CM

## 2013-06-18 DIAGNOSIS — N182 Chronic kidney disease, stage 2 (mild): Secondary | ICD-10-CM | POA: Diagnosis not present

## 2013-06-18 DIAGNOSIS — I519 Heart disease, unspecified: Secondary | ICD-10-CM

## 2013-06-18 DIAGNOSIS — I739 Peripheral vascular disease, unspecified: Secondary | ICD-10-CM | POA: Diagnosis not present

## 2013-06-18 DIAGNOSIS — N2581 Secondary hyperparathyroidism of renal origin: Secondary | ICD-10-CM | POA: Diagnosis not present

## 2013-06-18 MED ORDER — ATORVASTATIN CALCIUM 20 MG PO TABS: 20.0000 mg | ORAL_TABLET | Freq: Every day | ORAL | Status: DC

## 2013-06-18 NOTE — Progress Notes (Signed)
[name] [address], [address] [place], [place]  [address] Phone: [phone] Fax:  [phone]  Date:  06/18/2013   ID:  Stephanie Peck, DOB Oct 24, 1960, MRN [id number]  PCP:  Maurice Small, D, MD   History of Present Illness: Stephanie Peck is a 53 y.o. female with diabetes with significant peripheral vascular disease, morbid obesity, diagnosed with mild COPD, quit smoking, sleep apnea, diastolic dysfunction with prior left femoral endarterectomy, right lower extremity stent (Dr. Trula Slade) here for follow up.  Had infection 03/2013, respiratory failure, infection, organ failure, sepsis. She made it through.   Had stress test, NUC that was low risk with no obvious ischemia on 01/03/12. Mild COPD. She has not had a prior cardiac catheterization. As far as cardiac symptoms, she has mild to moderate dyspnea on exertion which certainly could be multifactorial. She does not complaining of any specific chest pain. She is limited by her physical activity. She ambulates with a cane due to low back pain.   Since having right lower extremity stent placed, her overall leg feels improved.   Wt Readings from Last 3 Encounters:  06/18/13 271 lb 6.4 oz (123.106 kg)  03/14/13 249 lb 5.4 oz (113.1 kg)  03/14/13 249 lb 5.4 oz (113.1 kg)     Past Medical History  Diagnosis Date  . Diabetes mellitus   . GERD (gastroesophageal reflux disease)   . Anxiety   . CHF (congestive heart failure)   . COPD (chronic obstructive pulmonary disease)   . Sleep apnea     severe OSA by 09/08/06 sleep study ([facility])  . Depression   . Morbid obesity     Past Surgical History  Procedure Laterality Date  . Cholecystectomy    . Gastric bypass  2006  . Tonsillectomy    . Femoral artery stent  12/05/2011    right superficial   . Endarterectomy femoral  02/22/2012    Procedure: ENDARTERECTOMY FEMORAL;  Surgeon: Serafina Mitchell, MD;  Location: [facility];  Service: Vascular;  Laterality: Left;  . Patch angioplasty   02/22/2012    Procedure: PATCH ANGIOPLASTY;  Surgeon: Serafina Mitchell, MD;  Location[facility];  Service: Vascular;  Laterality: Left;  left femoral patch angioplasty  . Application of wound vac  02/22/2012    Procedure: APPLICATION OF WOUND VAC;  Surgeon: Serafina Mitchell, MD;  Location: [facility];  Service: Vascular;  Laterality: Left;  application wound vac  . Groin debridement  03/01/2012    Procedure: GROIN DEBRIDEMENT;  Surgeon: Rosetta Posner, MD;  Location: [facility];  Service: Vascular;  Laterality: Left;  . Aortogram Left 04/04/2012    Procedure: AORTOGRAM;  Surgeon: Serafina Mitchell, MD;  Location: [facility];  Service: Vascular;  Laterality: Left;  . Patch angioplasty Left 04/04/2012    Procedure: PATCH ANGIOPLASTY;  Surgeon: Serafina Mitchell, MD;  Location: [facility];  Service: Vascular;  Laterality: Left;  Marland Kitchen Muscle flap closure Left 04/04/2012    Procedure: MUSCLE FLAP CLOSURE;  Surgeon: Theodoro Kos, DO;  Location: [facility];  Service: Plastics;  Laterality: Left;  . Irrigation and debridement abscess Left 03/05/2013    Procedure: IRRIGATION AND DEBRIDEMENT ABSCESS;  Surgeon: Zenovia Jarred, MD;  Location: [facility];  Service: General;  Laterality: Left;    Current Outpatient Prescriptions  Medication Sig Dispense Refill  . aspirin EC 325 MG EC tablet Take 1 tablet (325 mg total) by mouth daily.  30 tablet  0  .  BD PEN NEEDLE NANO U/F 32G X 4 MM MISC       . cyclobenzaprine (FLEXERIL) 10 MG tablet Take 10 mg by mouth 3 (three) times daily as needed.       . DULoxetine (CYMBALTA) 30 MG capsule Take 1 capsule (30 mg total) by mouth daily.  30 capsule  3  . insulin detemir (LEVEMIR) 100 UNIT/ML injection Inject 64 Units into the skin daily.      Marland Kitchen JANUVIA 50 MG tablet Take 50 mg by mouth daily.       Marland Kitchen lisinopril (PRINIVIL,ZESTRIL) 2.5 MG tablet Take 2.5 mg by mouth daily.       Marland Kitchen omeprazole (PRILOSEC) 40 MG capsule Take 40 mg by mouth 2 (two) times daily.      Marland Kitchen oxyCODONE-acetaminophen (PERCOCET/ROXICET) 5-325 MG per  tablet Take 1 tablet by mouth every 6 (six) hours as needed for severe pain.  30 tablet  0  . topiramate (TOPAMAX) 50 MG tablet Take 1 tablet (50 mg total) by mouth daily.  30 tablet  3   No current facility-administered medications for this visit.    Allergies:    Allergies  Allergen Reactions  . Celebrex [Celecoxib] Shortness Of Breath  . Codeine Nausea Only    Social History:  The patient  reports that she quit smoking about 19 months ago. Her smoking use included Cigarettes. She started smoking about 20 months ago. She has a 40 pack-year smoking history. She has never used smokeless tobacco. She reports that she does not drink alcohol or use illicit drugs.   Family History  Problem Relation Age of Onset  . Cancer Mother   . Depression Mother   . Heart disease Father     before age 93  . Diabetes Father   . Hypertension Father   . Hyperlipidemia Father   . Heart attack Father   . Cancer Maternal Grandmother     ROS:  Please see the history of present illness.   Episode of sepsis, acute renal failure, acute respiratory failure in February of 2015 in the setting of cellulitis.   All other systems reviewed and negative.   PHYSICAL EXAM: VS:  BP 130/70  Pulse 84  Ht 5\' 7"  (1.702 m)  Wt 271 lb 6.4 oz (123.106 kg)  BMI 42.50 kg/m2 Well nourished, well developed, in no acute distress HEENT: normal, [place]/AT, EOMI Neck: no JVD, normal carotid upstroke, no bruit Cardiac:  normal S1, S2; RRR; no murmur Lungs:  clear to auscultation bilaterally, no wheezing, rhonchi or rales Abd: soft, nontender, no hepatomegaly, no bruits Ext: no edema, 2+ distal pulses Skin: warm and dry GU: deferred Neuro: no focal abnormalities noted, AAO x 3  EKG:  None today     ASSESSMENT AND PLAN:  1. No anginal symptoms. Ejection fraction normal. She withstood a severe infection, perirectal abscess that almost cost her her life. 2. Peripheral vascular disease-I will go ahead and restart atorvastatin  20 mg for secondary prevention. 3. Hyperlipidemia-atorvastatin-secondary prevention. 4. Obesity-encourage weight loss 5. OSA - encourage treatment. 6. Diabetes-medications reviewed. Encourage control. This will help with infections.  Signed, Candee Furbish, MD [facility]  06/18/2013 2:15 PM

## 2013-06-18 NOTE — Patient Instructions (Signed)
Your physician has recommended you make the following change in your medication:   1. Start Atorvastatin 20 mg once daily.  Your physician recommends that you return for lab work in: 2 months, ALT,  Lipid  Your physician wants you to follow-up in: 1 year with Dr. Marlou Porch. You will receive a reminder letter in the mail two months in advance. If you don't receive a letter, please call our office to schedule the follow-up appointment.

## 2013-06-19 ENCOUNTER — Ambulatory Visit: Payer: Medicare Other | Admitting: Cardiology

## 2013-06-24 DIAGNOSIS — L89309 Pressure ulcer of unspecified buttock, unspecified stage: Secondary | ICD-10-CM | POA: Diagnosis not present

## 2013-06-24 DIAGNOSIS — L89109 Pressure ulcer of unspecified part of back, unspecified stage: Secondary | ICD-10-CM | POA: Diagnosis not present

## 2013-06-29 ENCOUNTER — Encounter (HOSPITAL_BASED_OUTPATIENT_CLINIC_OR_DEPARTMENT_OTHER): Payer: Medicare Other | Attending: Plastic Surgery

## 2013-06-29 DIAGNOSIS — L98499 Non-pressure chronic ulcer of skin of other sites with unspecified severity: Secondary | ICD-10-CM | POA: Diagnosis not present

## 2013-06-29 NOTE — Progress Notes (Signed)
[facility]  NAME:  TRAMAINE, SNELL                     ACCOUNT NO.:  MEDICAL RECORD NO.:  [id number]      DATE OF BIRTH:  [birth year]  PHYSICIAN:  Theodoro Kos, DO       VISIT DATE:  06/29/2013                                  OFFICE VISIT   The patient is a 53 year old female who is here for followup on her chronic sacral ulcers.  She has been using a variety of treatments including collagen and alginate. Today completely healed.  There has been no change in her medications or social history.  REVIEW OF SYSTEMS:  Otherwise negative.  PHYSICAL EXAMINATION:  On exam, she is alert, cooperative, very pleasant, very excited.  The sacral area has completely healed as well as the left ischial area.  She will continue to work on her nutrition offload and see her back as needed.     Theodoro Kos, DO     CS/MEDQ  D:  06/29/2013  T:  06/29/2013  Job:  [id number]

## 2013-07-01 DIAGNOSIS — L89309 Pressure ulcer of unspecified buttock, unspecified stage: Secondary | ICD-10-CM | POA: Diagnosis not present

## 2013-07-01 DIAGNOSIS — L89109 Pressure ulcer of unspecified part of back, unspecified stage: Secondary | ICD-10-CM | POA: Diagnosis not present

## 2013-07-30 ENCOUNTER — Encounter: Payer: Self-pay | Admitting: Cardiology

## 2013-07-30 DIAGNOSIS — E669 Obesity, unspecified: Secondary | ICD-10-CM | POA: Diagnosis not present

## 2013-07-30 DIAGNOSIS — Z6841 Body Mass Index (BMI) 40.0 and over, adult: Secondary | ICD-10-CM | POA: Diagnosis not present

## 2013-07-30 DIAGNOSIS — E1142 Type 2 diabetes mellitus with diabetic polyneuropathy: Secondary | ICD-10-CM | POA: Diagnosis not present

## 2013-07-30 DIAGNOSIS — N183 Chronic kidney disease, stage 3 unspecified: Secondary | ICD-10-CM | POA: Diagnosis not present

## 2013-07-30 DIAGNOSIS — E1149 Type 2 diabetes mellitus with other diabetic neurological complication: Secondary | ICD-10-CM | POA: Diagnosis not present

## 2013-08-17 DIAGNOSIS — I10 Essential (primary) hypertension: Secondary | ICD-10-CM | POA: Diagnosis not present

## 2013-08-17 DIAGNOSIS — N183 Chronic kidney disease, stage 3 unspecified: Secondary | ICD-10-CM | POA: Diagnosis not present

## 2013-08-17 DIAGNOSIS — E1149 Type 2 diabetes mellitus with other diabetic neurological complication: Secondary | ICD-10-CM | POA: Diagnosis not present

## 2013-08-17 DIAGNOSIS — E669 Obesity, unspecified: Secondary | ICD-10-CM | POA: Diagnosis not present

## 2013-08-17 DIAGNOSIS — E1142 Type 2 diabetes mellitus with diabetic polyneuropathy: Secondary | ICD-10-CM | POA: Diagnosis not present

## 2013-08-17 DIAGNOSIS — E782 Mixed hyperlipidemia: Secondary | ICD-10-CM | POA: Diagnosis not present

## 2013-08-17 DIAGNOSIS — G43109 Migraine with aura, not intractable, without status migrainosus: Secondary | ICD-10-CM | POA: Diagnosis not present

## 2013-08-17 DIAGNOSIS — I5032 Chronic diastolic (congestive) heart failure: Secondary | ICD-10-CM | POA: Diagnosis not present

## 2013-08-18 ENCOUNTER — Other Ambulatory Visit: Payer: Medicare Other

## 2013-08-27 DIAGNOSIS — M25519 Pain in unspecified shoulder: Secondary | ICD-10-CM | POA: Diagnosis not present

## 2013-09-07 DIAGNOSIS — M25519 Pain in unspecified shoulder: Secondary | ICD-10-CM | POA: Diagnosis not present

## 2013-09-15 DIAGNOSIS — M19019 Primary osteoarthritis, unspecified shoulder: Secondary | ICD-10-CM | POA: Diagnosis not present

## 2013-09-15 DIAGNOSIS — M75110 Incomplete rotator cuff tear or rupture of unspecified shoulder, not specified as traumatic: Secondary | ICD-10-CM | POA: Diagnosis not present

## 2013-10-13 DIAGNOSIS — F339 Major depressive disorder, recurrent, unspecified: Secondary | ICD-10-CM | POA: Diagnosis not present

## 2013-10-13 DIAGNOSIS — N182 Chronic kidney disease, stage 2 (mild): Secondary | ICD-10-CM | POA: Diagnosis not present

## 2013-10-13 DIAGNOSIS — I129 Hypertensive chronic kidney disease with stage 1 through stage 4 chronic kidney disease, or unspecified chronic kidney disease: Secondary | ICD-10-CM | POA: Diagnosis not present

## 2013-10-14 DIAGNOSIS — M75110 Incomplete rotator cuff tear or rupture of unspecified shoulder, not specified as traumatic: Secondary | ICD-10-CM | POA: Diagnosis not present

## 2013-10-14 DIAGNOSIS — M19019 Primary osteoarthritis, unspecified shoulder: Secondary | ICD-10-CM | POA: Diagnosis not present

## 2013-11-02 DIAGNOSIS — E1121 Type 2 diabetes mellitus with diabetic nephropathy: Secondary | ICD-10-CM | POA: Diagnosis not present

## 2013-11-02 DIAGNOSIS — N183 Chronic kidney disease, stage 3 (moderate): Secondary | ICD-10-CM | POA: Diagnosis not present

## 2013-11-04 DIAGNOSIS — F331 Major depressive disorder, recurrent, moderate: Secondary | ICD-10-CM | POA: Diagnosis not present

## 2013-11-13 ENCOUNTER — Other Ambulatory Visit: Payer: Self-pay

## 2013-11-18 ENCOUNTER — Encounter: Payer: Self-pay | Admitting: Vascular Surgery

## 2013-11-18 ENCOUNTER — Ambulatory Visit (HOSPITAL_COMMUNITY)
Admission: RE | Admit: 2013-11-18 | Discharge: 2013-11-18 | Disposition: A | Discharge status: Home / Self Care | Payer: Medicare Other | Source: Ambulatory Visit | Attending: Vascular Surgery | Admitting: Vascular Surgery

## 2013-11-18 ENCOUNTER — Other Ambulatory Visit: Payer: Self-pay

## 2013-11-18 ENCOUNTER — Ambulatory Visit (INDEPENDENT_AMBULATORY_CARE_PROVIDER_SITE_OTHER)
Admission: RE | Admit: 2013-11-18 | Discharge: 2013-11-18 | Disposition: A | Discharge status: Home / Self Care | Payer: Medicare Other | Source: Ambulatory Visit | Attending: Vascular Surgery | Admitting: Vascular Surgery

## 2013-11-18 ENCOUNTER — Telehealth: Payer: Self-pay

## 2013-11-18 ENCOUNTER — Ambulatory Visit (INDEPENDENT_AMBULATORY_CARE_PROVIDER_SITE_OTHER): Payer: Medicare Other | Admitting: Vascular Surgery

## 2013-11-18 VITALS — BP 131/65 | HR 110 | Temp 97.7°F | Resp 18 | Ht 67.0 in | Wt 264.0 lb

## 2013-11-18 DIAGNOSIS — R208 Other disturbances of skin sensation: Secondary | ICD-10-CM | POA: Insufficient documentation

## 2013-11-18 DIAGNOSIS — I739 Peripheral vascular disease, unspecified: Secondary | ICD-10-CM

## 2013-11-18 DIAGNOSIS — Z23 Encounter for immunization: Secondary | ICD-10-CM | POA: Diagnosis not present

## 2013-11-18 DIAGNOSIS — R200 Anesthesia of skin: Secondary | ICD-10-CM

## 2013-11-18 DIAGNOSIS — L732 Hidradenitis suppurativa: Secondary | ICD-10-CM | POA: Diagnosis not present

## 2013-11-18 DIAGNOSIS — R209 Unspecified disturbances of skin sensation: Secondary | ICD-10-CM

## 2013-11-18 DIAGNOSIS — I10 Essential (primary) hypertension: Secondary | ICD-10-CM | POA: Diagnosis not present

## 2013-11-18 DIAGNOSIS — G8929 Other chronic pain: Secondary | ICD-10-CM | POA: Diagnosis not present

## 2013-11-18 DIAGNOSIS — R230 Cyanosis: Secondary | ICD-10-CM | POA: Diagnosis not present

## 2013-11-18 DIAGNOSIS — I200 Unstable angina: Secondary | ICD-10-CM

## 2013-11-18 DIAGNOSIS — F3342 Major depressive disorder, recurrent, in full remission: Secondary | ICD-10-CM | POA: Diagnosis not present

## 2013-11-18 NOTE — Telephone Encounter (Signed)
Phone call from Dr. Jason Nest office @ [facility].  Reported pt. presented today with left leg numbness, left foot cold, and toes blue.  Requested appt. today.  Discussed with Dr. Oneida Alar.  Rec'd v.o. for left LE Art. Duplex and ABI's ; advised to get pt. in ASAP.  Appt. given for 10:30 AM; notified Bobbie at [facility] of the appt.

## 2013-11-18 NOTE — Progress Notes (Signed)
History of Present Illness  Stephanie Peck is a 53 y.o. ([birth year]) female who presents with chief complaint: left foot numbness that started this morning and bilateral leg aching for one week. She describes the aching as "all over" and is associated with walking. This aching has progressively worsened. She describes left rest pain at night.  She previously had a left external iliac stent by Dr. Trula Slade on 12/09/12 after two previous left femoral endarterectomies that occluded. She has also had a left superficial femoral drug balloon angioplasty. She had a left groin wound breakdown following her redo left femoral endarterectomy that did not heal requiring a muscle flap by Dr. Migdalia Dk. Her wound is well-healed. She also has a history of right external iliac stent and a right superficial femoral artery stent.  She has not been seen in the office since her aortogram and left iliac stent placement in November 2014. In February 2015, she had a left rectal abscess that was drained in the OR and returned to the ICU on the vent. She was treated for septic shock and was slow to recover. She was started on CRRT in the setting of acute renal failure. She was extubated after 8 days and it was decided she would be placed on palliative care. However, her condition continued to improve. She currently sees Dr. Moshe Cipro for nephrology.   On ROS today: she denies any non-healing wounds. She notes shortness of breath when lying flat and shortness of breath with exertion.  All other systems negative.   She is currently on a 325 mg aspirin. She is unable to tolerate plavix. She is on lipitor.   Physical Examination  Filed Vitals:   11/18/13 1207  BP: 131/65  Pulse: 110  Temp: 97.7 F (36.5 C)  TempSrc: Oral  Resp: 18  Height: 5\' 7"  (1.702 m)  Weight: 264 lb (119.75 kg)  SpO2: 99%   Body mass index is 41.34 kg/(m^2).  General: A&O x 3, WD obese female in NAD  Pulmonary: Sym exp, good air movt, CTAB,  no rales, rhonchi, & wheezing  Cardiac: RRR, Nl S1, S2, no Murmurs, rubs or gallops  Vascular: Unable to palpate femoral, popliteal and pedal pulses bilaterally   Musculoskeletal: M/S 5/5 throughout. Bilateral duskiness of toes, left greater than right. No wounds noted. Brisk capillary refill.   Neurologic: Pain and light touch intact in extremities.  Motor exam as listed above  Non-Invasive Vascular Imaging ABI (Date: 11/18/2013)  R: .74  L: .48  TBI  R: .40  L: unable to obtain.   Medical Decision Making  Stephanie Peck is a 53 y.o. female who presents with bilateral intermittent claudication and left rest pain. She has intact motor and sensory function of her lower legs bilaterally. Suspect that she has subtotal occlusion of her left iliac stent or her left superficial femoral artery. She will be scheduled for an aortogram with bilateral runoff with possible intervention through a right groin cannulation on Tuesday, 11/24/13 with Dr. Trula Slade. She has no current creatinine labs. She will need to have her creatinine checked before proceeding due to previous acute renal failure in the setting of sepsis. She is currently on a 325 mg aspirin. She is unable to tolerate plavix.   Virgina Jock, PA-C [facility] Office: [phone] Pager: [phone]  11/18/2013, 12:52 PM  This patient was seen in conjunction with Dr. Oneida Alar.   History and exam findings as above. Patient most likely has subacute occlusion  of her left superficial femoral artery stent or possibly subtotal occlusion of her left common femoral artery or external iliac stent. Procedure details risks benefits possible complications were discussed with the patient today. She is scheduled for an arteriogram by Dr. Trula Slade on October 27.  Ruta Hinds, MD [facility] Office: [phone] Pager: [phone]

## 2013-11-19 ENCOUNTER — Encounter (HOSPITAL_COMMUNITY): Payer: Self-pay | Admitting: Pharmacy Technician

## 2013-11-19 ENCOUNTER — Other Ambulatory Visit: Payer: Self-pay | Admitting: Family Medicine

## 2013-11-19 DIAGNOSIS — F331 Major depressive disorder, recurrent, moderate: Secondary | ICD-10-CM | POA: Diagnosis not present

## 2013-11-19 DIAGNOSIS — L0291 Cutaneous abscess, unspecified: Secondary | ICD-10-CM

## 2013-11-23 MED ORDER — SODIUM CHLORIDE 0.9 % IV SOLN
INTRAVENOUS | Status: DC
  Administered 2013-11-24: 06:00:00 via INTRAVENOUS

## 2013-11-24 ENCOUNTER — Encounter (HOSPITAL_COMMUNITY)
Admission: RE | Disposition: A | Discharge status: Home / Self Care | Payer: Self-pay | Source: Ambulatory Visit | Attending: Surgery

## 2013-11-24 ENCOUNTER — Ambulatory Visit (HOSPITAL_COMMUNITY)
Admission: RE | Admit: 2013-11-24 | Discharge: 2013-11-24 | Disposition: A | Discharge status: Home / Self Care | Payer: Medicare Other | Source: Ambulatory Visit | Attending: Surgery | Admitting: Surgery

## 2013-11-24 DIAGNOSIS — I745 Embolism and thrombosis of iliac artery: Secondary | ICD-10-CM | POA: Insufficient documentation

## 2013-11-24 DIAGNOSIS — E669 Obesity, unspecified: Secondary | ICD-10-CM | POA: Diagnosis not present

## 2013-11-24 DIAGNOSIS — I70212 Atherosclerosis of native arteries of extremities with intermittent claudication, left leg: Secondary | ICD-10-CM | POA: Insufficient documentation

## 2013-11-24 DIAGNOSIS — R0602 Shortness of breath: Secondary | ICD-10-CM | POA: Diagnosis not present

## 2013-11-24 DIAGNOSIS — Z95820 Peripheral vascular angioplasty status with implants and grafts: Secondary | ICD-10-CM | POA: Insufficient documentation

## 2013-11-24 DIAGNOSIS — I70222 Atherosclerosis of native arteries of extremities with rest pain, left leg: Secondary | ICD-10-CM | POA: Diagnosis not present

## 2013-11-24 DIAGNOSIS — Z7982 Long term (current) use of aspirin: Secondary | ICD-10-CM | POA: Diagnosis not present

## 2013-11-24 DIAGNOSIS — Z6841 Body Mass Index (BMI) 40.0 and over, adult: Secondary | ICD-10-CM | POA: Diagnosis not present

## 2013-11-24 DIAGNOSIS — M79605 Pain in left leg: Secondary | ICD-10-CM | POA: Diagnosis present

## 2013-11-24 HISTORY — PX: ABDOMINAL AORTAGRAM: SHX5454

## 2013-11-24 LAB — POCT I-STAT, CHEM 8
BUN: 33 mg/dL — AB (ref 6–23)
CHLORIDE: 106 meq/L (ref 96–112)
Calcium, Ion: 1.19 mmol/L (ref 1.12–1.23)
Creatinine, Ser: 1.4 mg/dL — ABNORMAL HIGH (ref 0.50–1.10)
GLUCOSE: 203 mg/dL — AB (ref 70–99)
HEMATOCRIT: 43 % (ref 36.0–46.0)
Hemoglobin: 14.6 g/dL (ref 12.0–15.0)
POTASSIUM: 4.8 meq/L (ref 3.7–5.3)
Sodium: 140 mEq/L (ref 137–147)
TCO2: 21 mmol/L (ref 0–100)

## 2013-11-24 LAB — GLUCOSE, CAPILLARY
Glucose-Capillary: 188 mg/dL — ABNORMAL HIGH (ref 70–99)
Glucose-Capillary: 187 mg/dL — ABNORMAL HIGH (ref 70–99)

## 2013-11-24 LAB — CREATININE, SERUM
Creatinine, Ser: 1.3 mg/dL — ABNORMAL HIGH (ref 0.50–1.10)
GFR calc Af Amer: 53 mL/min — ABNORMAL LOW (ref >90)
GFR, EST NON AFRICAN AMERICAN: 45 mL/min — AB (ref >90)

## 2013-11-24 SURGERY — ABDOMINAL AORTAGRAM
Anesthesia: LOCAL

## 2013-11-24 MED ORDER — PROMETHAZINE HCL 25 MG PO TABS
25.0000 mg | ORAL_TABLET | Freq: Once | ORAL | Status: AC
  Administered 2013-11-24: 25 mg via ORAL
  Filled 2013-11-24: qty 1

## 2013-11-24 MED ORDER — ACETAMINOPHEN 325 MG RE SUPP: 325.0000 mg | RECTAL | Status: DC | PRN

## 2013-11-24 MED ORDER — HEPARIN (PORCINE) IN NACL 2-0.9 UNIT/ML-% IJ SOLN
INTRAMUSCULAR | Status: AC
  Filled 2013-11-24: qty 1000

## 2013-11-24 MED ORDER — FENTANYL CITRATE 0.05 MG/ML IJ SOLN
INTRAMUSCULAR | Status: AC
  Filled 2013-11-24: qty 2

## 2013-11-24 MED ORDER — SODIUM CHLORIDE 0.9 % IV SOLN: INTRAVENOUS | Status: DC

## 2013-11-24 MED ORDER — MIDAZOLAM HCL 2 MG/2ML IJ SOLN
INTRAMUSCULAR | Status: AC
  Filled 2013-11-24: qty 2

## 2013-11-24 MED ORDER — HYDRALAZINE HCL 20 MG/ML IJ SOLN: 5.0000 mg | INTRAMUSCULAR | Status: DC | PRN

## 2013-11-24 MED ORDER — OXYCODONE-ACETAMINOPHEN 5-325 MG PO TABS
ORAL_TABLET | ORAL | Status: AC
  Filled 2013-11-24: qty 2

## 2013-11-24 MED ORDER — ACETAMINOPHEN 325 MG PO TABS: 325.0000 mg | ORAL_TABLET | ORAL | Status: DC | PRN

## 2013-11-24 MED ORDER — METOPROLOL TARTRATE 1 MG/ML IV SOLN: 2.0000 mg | INTRAVENOUS | Status: DC | PRN

## 2013-11-24 MED ORDER — ALUM & MAG HYDROXIDE-SIMETH 200-200-20 MG/5ML PO SUSP: 15.0000 mL | ORAL | Status: DC | PRN

## 2013-11-24 MED ORDER — GUAIFENESIN-DM 100-10 MG/5ML PO SYRP: 15.0000 mL | ORAL_SOLUTION | ORAL | Status: DC | PRN

## 2013-11-24 MED ORDER — OXYCODONE-ACETAMINOPHEN 5-325 MG PO TABS
1.0000 | ORAL_TABLET | ORAL | Status: DC | PRN
  Administered 2013-11-24: 2 via ORAL

## 2013-11-24 MED ORDER — LABETALOL HCL 5 MG/ML IV SOLN: 10.0000 mg | INTRAVENOUS | Status: DC | PRN

## 2013-11-24 MED ORDER — MORPHINE SULFATE 4 MG/ML IJ SOLN
INTRAMUSCULAR | Status: AC
  Filled 2013-11-24: qty 1

## 2013-11-24 MED ORDER — MORPHINE SULFATE 10 MG/ML IJ SOLN
2.0000 mg | INTRAMUSCULAR | Status: DC | PRN
  Administered 2013-11-24: 4 mg via INTRAVENOUS

## 2013-11-24 MED ORDER — LIDOCAINE HCL (PF) 1 % IJ SOLN
INTRAMUSCULAR | Status: AC
  Filled 2013-11-24: qty 30

## 2013-11-24 MED ORDER — ONDANSETRON HCL 4 MG/2ML IJ SOLN
4.0000 mg | Freq: Four times a day (QID) | INTRAMUSCULAR | Status: DC | PRN
Start: 2013-11-24 — End: 2013-11-24

## 2013-11-24 MED ORDER — PHENOL 1.4 % MT LIQD: 1.0000 | OROMUCOSAL | Status: DC | PRN

## 2013-11-24 SURGICAL SUPPLY — 55 items
ADH SKN CLS APL DERMABOND .7 (GAUZE/BANDAGES/DRESSINGS) ×2
BANDAGE ELASTIC 4 VELCRO ST LF (GAUZE/BANDAGES/DRESSINGS) IMPLANT
BANDAGE ESMARK 6X9 LF (GAUZE/BANDAGES/DRESSINGS) IMPLANT
BNDG CMPR 9X6 STRL LF SNTH (GAUZE/BANDAGES/DRESSINGS)
BNDG ESMARK 6X9 LF (GAUZE/BANDAGES/DRESSINGS)
CANISTER SUCTION 2500CC (MISCELLANEOUS) ×3 IMPLANT
CLIP TI MEDIUM 24 (CLIP) ×3 IMPLANT
CLIP TI WIDE RED SMALL 24 (CLIP) ×3 IMPLANT
COVER SURGICAL LIGHT HANDLE (MISCELLANEOUS) ×3 IMPLANT
CUFF TOURNIQUET SINGLE 24IN (TOURNIQUET CUFF) IMPLANT
CUFF TOURNIQUET SINGLE 34IN LL (TOURNIQUET CUFF) IMPLANT
CUFF TOURNIQUET SINGLE 44IN (TOURNIQUET CUFF) IMPLANT
DERMABOND ADVANCED (GAUZE/BANDAGES/DRESSINGS) ×1
DERMABOND ADVANCED .7 DNX12 (GAUZE/BANDAGES/DRESSINGS) ×2 IMPLANT
DRAIN CHANNEL 15F RND FF W/TCR (WOUND CARE) IMPLANT
DRAPE WARM FLUID 44X44 (DRAPE) ×3 IMPLANT
DRAPE X-RAY CASS 24X20 (DRAPES) IMPLANT
DRSG COVADERM 4X10 (GAUZE/BANDAGES/DRESSINGS) IMPLANT
DRSG COVADERM 4X8 (GAUZE/BANDAGES/DRESSINGS) IMPLANT
ELECT REM PT RETURN 9FT ADLT (ELECTROSURGICAL) ×3
ELECTRODE REM PT RTRN 9FT ADLT (ELECTROSURGICAL) ×2 IMPLANT
EVACUATOR SILICONE 100CC (DRAIN) IMPLANT
GLOVE BIOGEL PI IND STRL 7.5 (GLOVE) ×2 IMPLANT
GLOVE BIOGEL PI INDICATOR 7.5 (GLOVE) ×1
GLOVE SURG SS PI 7.5 STRL IVOR (GLOVE) ×3 IMPLANT
GOWN PREVENTION PLUS XXLARGE (GOWN DISPOSABLE) ×3 IMPLANT
GOWN STRL NON-REIN LRG LVL3 (GOWN DISPOSABLE) ×9 IMPLANT
HEMOSTAT SNOW SURGICEL 2X4 (HEMOSTASIS) IMPLANT
KIT BASIN OR (CUSTOM PROCEDURE TRAY) ×3 IMPLANT
KIT ROOM TURNOVER OR (KITS) ×3 IMPLANT
MARKER GRAFT CORONARY BYPASS (MISCELLANEOUS) IMPLANT
NS IRRIG 1000ML POUR BTL (IV SOLUTION) ×6 IMPLANT
PACK PERIPHERAL VASCULAR (CUSTOM PROCEDURE TRAY) ×3 IMPLANT
PAD ARMBOARD 7.5X6 YLW CONV (MISCELLANEOUS) ×6 IMPLANT
PADDING CAST COTTON 6X4 STRL (CAST SUPPLIES) IMPLANT
SET COLLECT BLD 21X3/4 12 (NEEDLE) IMPLANT
STOPCOCK 4 WAY LG BORE MALE ST (IV SETS) IMPLANT
SUT ETHILON 3 0 PS 1 (SUTURE) IMPLANT
SUT PROLENE 5 0 C 1 24 (SUTURE) ×3 IMPLANT
SUT PROLENE 6 0 BV (SUTURE) ×3 IMPLANT
SUT PROLENE 7 0 BV 1 (SUTURE) IMPLANT
SUT SILK 2 0 SH (SUTURE) ×3 IMPLANT
SUT SILK 3 0 (SUTURE)
SUT SILK 3-0 18XBRD TIE 12 (SUTURE) IMPLANT
SUT VIC AB 2-0 CT1 27 (SUTURE) ×6
SUT VIC AB 2-0 CT1 TAPERPNT 27 (SUTURE) ×4 IMPLANT
SUT VIC AB 3-0 SH 27 (SUTURE) ×6
SUT VIC AB 3-0 SH 27X BRD (SUTURE) ×4 IMPLANT
SUT VICRYL 4-0 PS2 18IN ABS (SUTURE) ×6 IMPLANT
TOWEL OR 17X24 6PK STRL BLUE (TOWEL DISPOSABLE) ×6 IMPLANT
TOWEL OR 17X26 10 PK STRL BLUE (TOWEL DISPOSABLE) ×6 IMPLANT
TRAY FOLEY CATH 16FRSI W/METER (SET/KITS/TRAYS/PACK) ×3 IMPLANT
TUBING EXTENTION W/L.L. (IV SETS) IMPLANT
UNDERPAD 30X30 INCONTINENT (UNDERPADS AND DIAPERS) ×3 IMPLANT
WATER STERILE IRR 1000ML POUR (IV SOLUTION) ×3 IMPLANT

## 2013-11-24 NOTE — Op Note (Signed)
    Patient name: Stephanie Peck MRN: [id number] DOB: 02-04-1960 Sex: female  11/24/2013 Pre-operative Diagnosis: Left leg rest pain  Post-operative diagnosis:  Same Surgeon:  Eldridge Abrahams Procedure Performed:  1.  U/s guided access, left femoral artery  2.  Abdominal aortogram  3.  Bilateral lower extremity runoff    Indications:  Patient has previously undergone lower extremity intervention.  In the left groin she has had femoral endarterectomy and patch angioplasty 2 as well as rotational muscle flap for wound issues.  She developed numbness and pain in her left foot and comes in for further evaluation.  Procedure:  The patient was identified in the holding area and taken to room 8.  The patient was then placed supine on the table and prepped and draped in the usual sterile fashion.  A time out was called.  Ultrasound was used to evaluate the right common femoral artery.  It was patent .  A digital ultrasound image was acquired.  A micropuncture needle was used to access the right common femoral artery under ultrasound guidance.  An 018 wire was advanced without resistance and a micropuncture sheath was placed.  The 018 wire was removed and a benson wire was placed.  The micropuncture sheath was exchanged for a 5 french sheath.  An omniflush catheter was advanced over the wire to the level of L-1.  An abdominal angiogram was obtained.  Next the catheter was pulled down to the aortic bifurcation and bilateral runoff was performed  Findings:   Aortogram:  No significant aortic stenosis is identified.  The right common and external iliac artery are widely patent.  The left common iliac artery is widely patent as is the hypogastric artery.  The left external iliac artery is occluded.  Right Lower Extremity:  The right common femoral artery, profunda femoral artery widely patent.  There is mild diffuse disease throughout the superficial femoral artery with several areas of 50% stenosis.  A  stent is visualized within the adductor canal which is widely patent.  The popliteal artery is patent throughout it's course.  There appears to be three-vessel runoff however the contrast fades out before this couldn't definitively be evaluated  Left Lower Extremity:  There is reconstitution of the left common femoral artery where previous patch angioplasty was performed.  The superficial femoral artery is patent throughout its course as is the popliteal artery.  There appears to be three-vessel runoff, however contrast is not well visualized below the knee.  Intervention:  None  Impression:  #1  left external iliac artery occlusion  #2  reconstitution of the left common femoral artery  #3  several areas of stenosis within the right superficial femoral artery  #4  patient will be brought back for consideration of right to left femoral-femoral bypass graft and potential intervention on her right superficial femoral artery percutaneously    V. Annamarie Major, M.D. [facility] Office: [phone] Pager:  [phone]

## 2013-11-24 NOTE — Discharge Instructions (Signed)

## 2013-11-24 NOTE — Pre-Procedure Instructions (Signed)
Site area: RFA Site Prior to Removal:  Level 0 Pressure Applied For:5min Manual:    Patient Status During Pull: stable  Post Pull Site:  Level Post Pull Instructions Given:   Post Pull Pulses Present: doppler Dressing Applied:  clear Bedrest begins @ 1833 Comments:no complications

## 2013-11-24 NOTE — H&P (View-Only) (Signed)
History of Present Illness  Stephanie Peck is a 53 y.o. (1960-04-19) female who presents with chief complaint: left foot numbness that started this morning and bilateral leg aching for one week. She describes the aching as "all over" and is associated with walking. This aching has progressively worsened. She describes left rest pain at night.  She previously had a left external iliac stent by Dr. Trula Slade on 12/09/12 after two previous left femoral endarterectomies that occluded. She has also had a left superficial femoral drug balloon angioplasty. She had a left groin wound breakdown following her redo left femoral endarterectomy that did not heal requiring a muscle flap by Dr. Migdalia Dk. Her wound is well-healed. She also has a history of right external iliac stent and a right superficial femoral artery stent.  She has not been seen in the office since her aortogram and left iliac stent placement in November 2014. In February 2015, she had a left rectal abscess that was drained in the OR and returned to the ICU on the vent. She was treated for septic shock and was slow to recover. She was started on CRRT in the setting of acute renal failure. She was extubated after 8 days and it was decided she would be placed on palliative care. However, her condition continued to improve. She currently sees Dr. Moshe Cipro for nephrology.   On ROS today: she denies any non-healing wounds. She notes shortness of breath when lying flat and shortness of breath with exertion.  All other systems negative.   She is currently on a 325 mg aspirin. She is unable to tolerate plavix. She is on lipitor.   Physical Examination  Filed Vitals:   11/18/13 1207  BP: 131/65  Pulse: 110  Temp: 97.7 F (36.5 C)  TempSrc: Oral  Resp: 18  Height: 5\' 7"  (1.702 m)  Weight: 264 lb (119.75 kg)  SpO2: 99%   Body mass index is 41.34 kg/(m^2).  General: A&O x 3, WD obese female in NAD  Pulmonary: Sym exp, good air movt, CTAB,  no rales, rhonchi, & wheezing  Cardiac: RRR, Nl S1, S2, no Murmurs, rubs or gallops  Vascular: Unable to palpate femoral, popliteal and pedal pulses bilaterally   Musculoskeletal: M/S 5/5 throughout. Bilateral duskiness of toes, left greater than right. No wounds noted. Brisk capillary refill.   Neurologic: Pain and light touch intact in extremities.  Motor exam as listed above  Non-Invasive Vascular Imaging ABI (Date: 11/18/2013)  R: .74  L: .48  TBI  R: .40  L: unable to obtain.   Medical Decision Making  Stephanie Peck is a 53 y.o. female who presents with bilateral intermittent claudication and left rest pain. She has intact motor and sensory function of her lower legs bilaterally. Suspect that she has subtotal occlusion of her left iliac stent or her left superficial femoral artery. She will be scheduled for an aortogram with bilateral runoff with possible intervention through a right groin cannulation on Tuesday, 11/24/13 with Dr. Trula Slade. She has no current creatinine labs. She will need to have her creatinine checked before proceeding due to previous acute renal failure in the setting of sepsis. She is currently on a 325 mg aspirin. She is unable to tolerate plavix.   Virgina Jock, PA-C [facility] Office: [phone] Pager: [phone]  11/18/2013, 12:52 PM  This patient was seen in conjunction with Dr. Oneida Alar.   History and exam findings as above. Patient most likely has subacute occlusion  of her left superficial femoral artery stent or possibly subtotal occlusion of her left common femoral artery or external iliac stent. Procedure details risks benefits possible complications were discussed with the patient today. She is scheduled for an arteriogram by Dr. Trula Slade on October 27.  Ruta Hinds, MD [facility] Office: [phone] Pager: [phone]

## 2013-11-24 NOTE — Interval H&P Note (Signed)
History and Physical Interval Note:  11/24/2013 7:58 AM  Stephanie Peck  has presented today for surgery, with the diagnosis of pad, left leg numbness, cool foot  The various methods of treatment have been discussed with the patient and family. After consideration of risks, benefits and other options for treatment, the patient has consented to  Procedure(s): ABDOMINAL AORTAGRAM (N/A) as a surgical intervention .  The patient's history has been reviewed, patient examined, no change in status, stable for surgery.  I have reviewed the patient's chart and labs.  Questions were answered to the patient's satisfaction.     BRABHAM IV, V. WELLS

## 2013-11-25 ENCOUNTER — Telehealth: Payer: Self-pay | Admitting: Surgery

## 2013-11-25 NOTE — Telephone Encounter (Addendum)
Message copied by Gena Fray on Wed Nov 25, 2013 11:23 AM ------      Message from: Mena Goes      Created: Tue Nov 24, 2013  9:11 AM      Regarding: Schedule                   ----- Message -----         From: Serafina Mitchell, MD         Sent: 11/24/2013   8:29 AM           To: [facility] Charge Pool            11/24/2013:            Surgeon:  Eldridge Abrahams      Procedure Performed:       1.  U/s guided access, left femoral artery       2.  Abdominal aortogram       3.  Bilateral lower extremity runoff                  Please have patient follow up with me Monday for discussions of a femoral-femoral bypass ------  11/25/13: spoke with pts spouse to schedule, dpm

## 2013-11-26 ENCOUNTER — Ambulatory Visit
Admission: RE | Admit: 2013-11-26 | Discharge: 2013-11-26 | Disposition: A | Discharge status: Home / Self Care | Payer: Medicare Other | Source: Ambulatory Visit | Attending: Family Medicine | Admitting: Family Medicine

## 2013-11-26 DIAGNOSIS — L0291 Cutaneous abscess, unspecified: Secondary | ICD-10-CM

## 2013-11-26 DIAGNOSIS — N739 Female pelvic inflammatory disease, unspecified: Secondary | ICD-10-CM | POA: Diagnosis not present

## 2013-11-26 MED ORDER — IOHEXOL 300 MG/ML  SOLN
125.0000 mL | Freq: Once | INTRAMUSCULAR | Status: AC | PRN
  Administered 2013-11-26: 125 mL via INTRAVENOUS

## 2013-11-27 ENCOUNTER — Encounter: Payer: Self-pay | Admitting: Surgery

## 2013-11-30 ENCOUNTER — Encounter: Payer: Self-pay | Admitting: Surgery

## 2013-11-30 ENCOUNTER — Ambulatory Visit (INDEPENDENT_AMBULATORY_CARE_PROVIDER_SITE_OTHER): Payer: Medicare Other | Admitting: Surgery

## 2013-11-30 ENCOUNTER — Telehealth: Payer: Self-pay | Admitting: Cardiology

## 2013-11-30 VITALS — BP 151/72 | HR 104 | Temp 98.9°F | Resp 18 | Ht 67.0 in | Wt 271.1 lb

## 2013-11-30 DIAGNOSIS — Z01810 Encounter for preprocedural cardiovascular examination: Secondary | ICD-10-CM

## 2013-11-30 DIAGNOSIS — I200 Unstable angina: Secondary | ICD-10-CM

## 2013-11-30 DIAGNOSIS — I739 Peripheral vascular disease, unspecified: Secondary | ICD-10-CM | POA: Diagnosis not present

## 2013-11-30 NOTE — Telephone Encounter (Signed)
New message     Pt is having surgery on 12-17-13.  Will Dr Marlou Porch clear her?----Notes in epic

## 2013-11-30 NOTE — Progress Notes (Signed)
Patient name: Stephanie Peck MRN: [id number] DOB: Jul 06, 1960 Sex: female     Chief Complaint  Patient presents with  . PVD    Discuss surgery    HISTORY OF PRESENT ILLNESS: The patient is here today for followup. On 04/07/2012 she underwent redo left femoral endarterectomy with patch angioplasty. I previously performed a left femoral endarterectomy and this has occluded. She came in with numbness to her left foot. Dr. Migdalia Dk provided a muscle flap for wound coverage. The patient's numbness resolved after her operation.she recently began complaining of numbness in her left foot.  She underwent angiography which reveals an occluded left external iliac artery.  She reports that she cannot walk very far before she gets pain in her feet.  Her foot remains numb.  She is intolerant of Plavix.  Earlier this year she suffered a bout of sepsis where she had acute renal failure.  This has resulted.  Past Medical History  Diagnosis Date  . Diabetes mellitus   . GERD (gastroesophageal reflux disease)   . Anxiety   . CHF (congestive heart failure)   . COPD (chronic obstructive pulmonary disease)   . Sleep apnea     severe OSA by 09/08/06 sleep study ([facility])  . Depression   . Morbid obesity     Past Surgical History  Procedure Laterality Date  . Cholecystectomy    . Gastric bypass  2006  . Tonsillectomy    . Femoral artery stent  12/05/2011    right superficial   . Endarterectomy femoral  02/22/2012    Procedure: ENDARTERECTOMY FEMORAL;  Surgeon: Serafina Mitchell, MD;  Location: [place];  Service: Vascular;  Laterality: Left;  . Patch angioplasty  02/22/2012    Procedure: PATCH ANGIOPLASTY;  Surgeon: Serafina Mitchell, MD;  Location: [facility];  Service: Vascular;  Laterality: Left;  left femoral patch angioplasty  . Application of wound vac  02/22/2012    Procedure: APPLICATION OF WOUND VAC;  Surgeon: Serafina Mitchell, MD;  Location: [place];  Service: Vascular;  Laterality: Left;  application wound vac    . Groin debridement  03/01/2012    Procedure: GROIN DEBRIDEMENT;  Surgeon: Rosetta Posner, MD;  Location: [place];  Service: Vascular;  Laterality: Left;  . Aortogram Left 04/04/2012    Procedure: AORTOGRAM;  Surgeon: Serafina Mitchell, MD;  Location: [place];  Service: Vascular;  Laterality: Left;  . Patch angioplasty Left 04/04/2012    Procedure: PATCH ANGIOPLASTY;  Surgeon: Serafina Mitchell, MD;  Location: [place];  Service: Vascular;  Laterality: Left;  Marland Kitchen Muscle flap closure Left 04/04/2012    Procedure: MUSCLE FLAP CLOSURE;  Surgeon: Theodoro Kos, DO;  Location: [place];  Service: Plastics;  Laterality: Left;  . Irrigation and debridement abscess Left 03/05/2013    Procedure: IRRIGATION AND DEBRIDEMENT ABSCESS;  Surgeon: Zenovia Jarred, MD;  Location: [place];  Service: General;  Laterality: Left;    History   Social History  . Marital Status: Married    Spouse Name: N/A    Number of Children: N/A  . Years of Education: N/A   Occupational History  . Not on file.   Social History Main Topics  . Smoking status: Former Smoker -- 2.00 packs/day for 20 years    Types: Cigarettes    Start date: 09/30/2011    Quit date: 10/30/2011  . Smokeless tobacco: Never Used  . Alcohol Use: No  . Drug Use: No  . Sexual Activity:  Yes    Birth Control/ Protection: Post-menopausal   Other Topics Concern  . Not on file   Social History Narrative    Family History  Problem Relation Age of Onset  . Cancer Mother   . Depression Mother   . Heart disease Father     before age 56  . Diabetes Father   . Hypertension Father   . Hyperlipidemia Father   . Heart attack Father   . Cancer Maternal Grandmother     Allergies as of 11/30/2013 - Review Complete 11/30/2013  Allergen Reaction Noted  . Celebrex [celecoxib] Shortness Of Breath 03/05/2013  . Codeine Nausea Only     Current Outpatient Prescriptions on File Prior to Visit  Medication Sig Dispense Refill  . albuterol (PROVENTIL HFA;VENTOLIN HFA) 108  (90 BASE) MCG/ACT inhaler Inhale 1 puff into the lungs every 6 (six) hours as needed for wheezing or shortness of breath.    Marland Kitchen aspirin EC 325 MG EC tablet Take 1 tablet (325 mg total) by mouth daily. 30 tablet 0  . atorvastatin (LIPITOR) 20 MG tablet Take 1 tablet (20 mg total) by mouth daily. 90 tablet 3  . BD PEN NEEDLE NANO U/F 32G X 4 MM MISC     . buPROPion (WELLBUTRIN XL) 300 MG 24 hr tablet Take 300 mg by mouth daily.    . DULoxetine (CYMBALTA) 60 MG capsule Take 60 mg by mouth daily.    . Ferrous Sulfate (SLOW FE PO) Take 1 tablet by mouth daily.    . insulin aspart (NOVOLOG) 100 UNIT/ML injection Inject 14 Units into the skin at bedtime.     . insulin detemir (LEVEMIR) 100 UNIT/ML injection Inject 64 Units into the skin daily.    Marland Kitchen JANUVIA 50 MG tablet Take 50 mg by mouth daily.     Marland Kitchen lisinopril (PRINIVIL,ZESTRIL) 2.5 MG tablet Take 2.5 mg by mouth daily.     Marland Kitchen losartan (COZAAR) 50 MG tablet Take 50 mg by mouth daily.    . Multiple Vitamins-Minerals (MULTIVITAMIN WITH MINERALS) tablet Take 1 tablet by mouth daily.    Marland Kitchen omeprazole (PRILOSEC) 40 MG capsule Take 40 mg by mouth 2 (two) times daily.    Marland Kitchen OVER THE COUNTER MEDICATION Take 630 mg by mouth daily. OTC Calcium    . topiramate (TOPAMAX) 50 MG tablet Take 1 tablet (50 mg total) by mouth daily. 30 tablet 3  . vitamin B-12 (CYANOCOBALAMIN) 500 MCG tablet Take 500 mcg by mouth daily.     No current facility-administered medications on file prior to visit.     REVIEW OF SYSTEMS: No changes from her most recent clinic visit  PHYSICAL EXAMINATION:   Vital signs are BP 151/72 mmHg  Pulse 104  Temp(Src) 98.9 F (37.2 C) (Oral)  Resp 18  Ht 5\' 7"  (1.702 m)  Wt 271 lb 1.6 oz (122.97 kg)  BMI 42.45 kg/m2  SpO2 99% General: The patient appears their stated age. HEENT:  No gross abnormalities Pulmonary:  Non labored breathing Abdomen: Soft and non-tender, obese Musculoskeletal: There are no major deformities. Neurologic: No  focal weakness or paresthesias are detected, Skin: There are no ulcer or rashes noted. Psychiatric: The patient has normal affect. Cardiovascular: palpable right femoral pulse.  Pedal pulses are palpable.   Diagnostic Studies none  Assessment: Peripheral vascular disease with rest pain, left leg Plan: I discussed the angiographic findings today with the patient.  She has severe pain in her left leg as well as numbness in  her foot.  This is significantly limiting her activity level.  I think the best course of action is to proceed with a right to left femoral-femoral bypass graft.  The patient is also complaining of pain in her right leg with walking.  Angiography revealed a diffusely diseased superficial femoral artery.  If her femoral-femoral bypass graft goes smoothly, I would consider cannulating the bypass graft and percutaneously treating her right superficial femoral artery.  I discussed with the patient that I may not do this if it takes a long time to do her operation.  She continues to be at risk for groin infection and wound complications, particularly given the fact that she is artery had a muscle flap to her left groin at her initial operation.  This operation has been scheduled for Thursday, November 20.I am going to get cardiac clearance from Dr. Marlou Porch.  Eldridge Abrahams, M.D. [facility] Office: [phone] Pager:  [phone]

## 2013-11-30 NOTE — Addendum Note (Signed)
Addended by: Mena Goes on: 11/30/2013 03:41 PM   Modules accepted: Orders

## 2013-11-30 NOTE — Telephone Encounter (Signed)
Pt needing a right to left fem-fem bypass scheduled according to notes for 11/20 with Dr Trula Slade.  OK for surgery?

## 2013-12-01 NOTE — Telephone Encounter (Signed)
She may proceed with surgery with low to moderate cardiac risk. NUC stress in 2013 was low risk.  Candee Furbish, MD

## 2013-12-01 NOTE — Telephone Encounter (Signed)
Left message for Stephanie Peck to see if she needs me to fax this - if so, to what number or if she can see it and print it herself if needed.

## 2013-12-02 ENCOUNTER — Other Ambulatory Visit: Payer: Self-pay

## 2013-12-03 DIAGNOSIS — F331 Major depressive disorder, recurrent, moderate: Secondary | ICD-10-CM | POA: Diagnosis not present

## 2013-12-10 ENCOUNTER — Other Ambulatory Visit (HOSPITAL_COMMUNITY): Payer: Self-pay | Admitting: *Deleted

## 2013-12-10 NOTE — Pre-Procedure Instructions (Signed)
Stephanie Peck  12/10/2013   Your procedure is scheduled on:  Thursday, December 17, 2013 at 10:20 AM.   Report to [facility] Entrance "A" Admitting Office at 8:20 AM.   Call this number if you have problems the morning of surgery: [phone]   Remember:    Do not eat food or drink liquids after midnight Wednesday, 12/16/13.   Take these medicines the morning of surgery with A SIP OF WATER: aspirin EC, buPROPion (WELLBUTRIN XL),  DULoxetine (CYMBALTA), omeprazole (PRILOSEC), topiramate (TOPAMAX), albuterol (PROVENTIL HFA;VENTOLIN HFA) inhaler - if needed, oxyCODONE-acetaminophen (PERCOCET/ROXICET) - if needed.    Do not wear jewelry, make-up or nail polish.  Do not wear lotions, powders, or perfumes. You may wear deodorant.  Do not shave underarms & legs 48 hours prior to surgery.    Do not bring valuables to the hospital.  [facility] is not responsible for any belongings or valuables.               Contacts, dentures or bridgework may not be worn into surgery.  Leave suitcase in the car. After surgery it may be brought to your room.  For patients admitted to the hospital, discharge time is determined by your treatment team.               Special Instructions: [place] - Preparing for Surgery  Before surgery, you can play an important role.  Because skin is not sterile, your skin needs to be as free of germs as possible.  You can reduce the number of germs on you skin by washing with CHG (chlorahexidine gluconate) soap before surgery.  CHG is an antiseptic cleaner which kills germs and bonds with the skin to continue killing germs even after washing.  Please DO NOT use if you have an allergy to CHG or antibacterial soaps.  If your skin becomes reddened/irritated stop using the CHG and inform your nurse when you arrive at Short Stay.  Do not shave (including legs and underarms) for at least 48 hours prior to the first CHG shower.  You may shave your face.  Please follow  these instructions carefully:   1.  Shower with CHG Soap the night before surgery and the  morning of Surgery.  2.  If you choose to wash your hair, wash your hair first as usual with your normal shampoo.  3.  After you shampoo, rinse your hair and body thoroughly to remove the Shampoo.   4.  Use CHG as you would any other liquid soap.  You can apply chg directly to the skin and wash gently with scrungie or a clean washcloth.  5.  Apply the CHG Soap to your body ONLY FROM THE NECK DOWN. Do not use on open wounds or open sores.  Avoid contact with your eyes, ears, mouth and genitals (private parts).  Wash genitals (private parts) with your normal soap.  6.  Wash thoroughly, paying special attention to the area where your surgery will be performed.  7.  Thoroughly rinse your body with warm water from the neck down.  8.  DO NOT shower/wash with your normal soap after using and rinsing off the CHG Soap.  9.  Pat yourself dry with a clean towel.            10.  Wear clean pajamas.            11.  Place clean sheets on your bed the night of your first shower  and do not sleep with pets.  Day of Surgery  Do not apply any lotions the morning of surgery.  Please wear clean clothes to the hospital.     Please read over the following fact sheets that you were given: Pain Booklet, Coughing and Deep Breathing, Blood Transfusion Information, MRSA Information and Surgical Site Infection Prevention

## 2013-12-11 ENCOUNTER — Encounter (HOSPITAL_COMMUNITY)
Admission: RE | Admit: 2013-12-11 | Discharge: 2013-12-11 | Disposition: A | Discharge status: Home / Self Care | Payer: Medicare Other | Source: Ambulatory Visit | Attending: Surgery | Admitting: Surgery

## 2013-12-11 DIAGNOSIS — E119 Type 2 diabetes mellitus without complications: Secondary | ICD-10-CM | POA: Insufficient documentation

## 2013-12-11 DIAGNOSIS — Z87891 Personal history of nicotine dependence: Secondary | ICD-10-CM | POA: Insufficient documentation

## 2013-12-11 DIAGNOSIS — Z01818 Encounter for other preprocedural examination: Secondary | ICD-10-CM | POA: Diagnosis not present

## 2013-12-11 DIAGNOSIS — K219 Gastro-esophageal reflux disease without esophagitis: Secondary | ICD-10-CM | POA: Insufficient documentation

## 2013-12-11 DIAGNOSIS — G4733 Obstructive sleep apnea (adult) (pediatric): Secondary | ICD-10-CM | POA: Diagnosis not present

## 2013-12-11 DIAGNOSIS — I509 Heart failure, unspecified: Secondary | ICD-10-CM | POA: Diagnosis not present

## 2013-12-11 DIAGNOSIS — J449 Chronic obstructive pulmonary disease, unspecified: Secondary | ICD-10-CM | POA: Insufficient documentation

## 2013-12-11 DIAGNOSIS — R000 Tachycardia, unspecified: Secondary | ICD-10-CM | POA: Insufficient documentation

## 2013-12-11 DIAGNOSIS — Z6841 Body Mass Index (BMI) 40.0 and over, adult: Secondary | ICD-10-CM | POA: Insufficient documentation

## 2013-12-11 LAB — URINALYSIS, ROUTINE W REFLEX MICROSCOPIC
Bilirubin Urine: NEGATIVE
GLUCOSE, UA: 500 mg/dL — AB
HGB URINE DIPSTICK: NEGATIVE
KETONES UR: NEGATIVE mg/dL
Nitrite: NEGATIVE
PH: 6 (ref 5.0–8.0)
PROTEIN: NEGATIVE mg/dL
Specific Gravity, Urine: 1.015 (ref 1.005–1.030)
Urobilinogen, UA: 0.2 mg/dL (ref 0.0–1.0)

## 2013-12-11 LAB — COMPREHENSIVE METABOLIC PANEL
ALT: 28 U/L (ref 0–35)
ANION GAP: 13 (ref 5–15)
AST: 14 U/L (ref 0–37)
Albumin: 3.4 g/dL — ABNORMAL LOW (ref 3.5–5.2)
Alkaline Phosphatase: 78 U/L (ref 39–117)
BUN: 29 mg/dL — ABNORMAL HIGH (ref 6–23)
CALCIUM: 9.1 mg/dL (ref 8.4–10.5)
CO2: 22 mEq/L (ref 19–32)
Chloride: 99 mEq/L (ref 96–112)
Creatinine, Ser: 1.48 mg/dL — ABNORMAL HIGH (ref 0.50–1.10)
GFR, EST AFRICAN AMERICAN: 45 mL/min — AB (ref >90)
GFR, EST NON AFRICAN AMERICAN: 39 mL/min — AB (ref >90)
GLUCOSE: 329 mg/dL — AB (ref 70–99)
Potassium: 4.4 mEq/L (ref 3.7–5.3)
SODIUM: 134 meq/L — AB (ref 137–147)
Total Bilirubin: 0.2 mg/dL — ABNORMAL LOW (ref 0.3–1.2)
Total Protein: 7.4 g/dL (ref 6.0–8.3)

## 2013-12-11 LAB — CBC
HCT: 38.9 % (ref 36.0–46.0)
Hemoglobin: 12.9 g/dL (ref 12.0–15.0)
MCH: 32 pg (ref 26.0–34.0)
MCHC: 33.2 g/dL (ref 30.0–36.0)
MCV: 96.5 fL (ref 78.0–100.0)
Platelets: 445 10*3/uL — ABNORMAL HIGH (ref 150–400)
RBC: 4.03 MIL/uL (ref 3.87–5.11)
RDW: 13.8 % (ref 11.5–15.5)
WBC: 10.8 10*3/uL — AB (ref 4.0–10.5)

## 2013-12-11 LAB — TYPE AND SCREEN
ABO/RH(D): O POS
Antibody Screen: NEGATIVE

## 2013-12-11 LAB — APTT: APTT: 32 s (ref 24–37)

## 2013-12-11 LAB — URINE MICROSCOPIC-ADD ON

## 2013-12-11 LAB — PROTIME-INR
INR: 1.04 (ref 0.00–1.49)
PROTHROMBIN TIME: 13.7 s (ref 11.6–15.2)

## 2013-12-11 LAB — SURGICAL PCR SCREEN
MRSA, PCR: NEGATIVE
Staphylococcus aureus: NEGATIVE

## 2013-12-11 NOTE — Progress Notes (Signed)
Anesthesia Chart Review:  Pt is 53 year old female scheduled for fem-fem bypass graft, possible insertion of R lower extremity stent (upper leg) on 12/17/2013 with Dr. Trula Slade.   PMH: CHF, DM, OSA, COPD, GERD. Former smoker- 40 pack years. BMI 42  Medications include: ASA, losartan, lisinopril, insulin, januvia, albuterol  Preoperative labs reviewed.  Glucose 329; range over last 9 months 203-333; last Hgb A1C from 07/2013 was 10.2. BUN 29; range over last 9 months 33-72. Cr 1.48; range over last 9 months 1.3-2.88. The higher range of BUN/Cr values are from inpatient hospital stay last February for sepsis.   EKG 03/18/2013: sinus tachycardia (104 bpm).   2D echo 03/06/2013: - Left ventricle: The cavity size was normal. Wall thickness was increased in a pattern of mild LVH. Systolic function was normal. The estimated ejection fraction was in the range of 55% to 60%. Wall motion was normal; there were no regional wall motion abnormalities. Due to tachycardia, there was fusion of early and atrial contributions to ventricular filling.  Pt has cardiac clearance for surgery from cardiologist Dr. Marlou Porch with low to moderate cardiac risk in telephone encounter in [place] dated 12/01/2013. Dr. Marlou Porch' note indicates pt had nuclear stress test in 2013 which was low risk.   Myra Gianotti, PA-C spoke with pt today. Most recent HgbA1C is 7.3. Fasting blood sugars usually 120-130's. Instructed to hold AM Levemir if blood sugar in 120'3-130's; if closer to 200, may take 1/2 dose prior to arriving for surgery. Endocrinologist is Dr. Buddy Duty.   If fasting blood sugar is acceptable DOS, I anticipate pt can proceed with surgery as scheduled.   Willeen Cass, FNP-BC [facility]/Anesthesiology Phone: [phone] 12/14/2013 2:25 PM

## 2013-12-11 NOTE — Progress Notes (Signed)
Some labs are abnormal, but they are with in the guidelines for anesthesia.  All except the blood sugar which is 327.  She does take insulin.   DA

## 2013-12-16 MED ORDER — DEXTROSE 5 % IV SOLN
1.5000 g | INTRAVENOUS | Status: AC
  Administered 2013-12-17: 1.5 g via INTRAVENOUS
  Filled 2013-12-16: qty 1.5

## 2013-12-16 MED ORDER — SODIUM CHLORIDE 0.9 % IV SOLN: INTRAVENOUS | Status: DC

## 2013-12-17 ENCOUNTER — Inpatient Hospital Stay (HOSPITAL_COMMUNITY): Payer: Medicare Other | Admitting: Emergency Medicine

## 2013-12-17 ENCOUNTER — Encounter (HOSPITAL_COMMUNITY)
Admission: RE | Disposition: A | Discharge status: Home Health | Payer: Self-pay | Source: Ambulatory Visit | Attending: Surgery

## 2013-12-17 ENCOUNTER — Inpatient Hospital Stay (HOSPITAL_COMMUNITY)
Admission: RE | Admit: 2013-12-17 | Discharge: 2013-12-21 | DRG: 253 | Disposition: A | Discharge status: Home Health | Payer: Medicare Other | Source: Ambulatory Visit | Attending: Surgery | Admitting: Surgery

## 2013-12-17 ENCOUNTER — Encounter (HOSPITAL_COMMUNITY): Payer: Self-pay | Admitting: Anesthesiology

## 2013-12-17 ENCOUNTER — Inpatient Hospital Stay (HOSPITAL_COMMUNITY): Payer: Medicare Other | Admitting: Anesthesiology

## 2013-12-17 DIAGNOSIS — Z79899 Other long term (current) drug therapy: Secondary | ICD-10-CM | POA: Diagnosis not present

## 2013-12-17 DIAGNOSIS — J449 Chronic obstructive pulmonary disease, unspecified: Secondary | ICD-10-CM | POA: Diagnosis present

## 2013-12-17 DIAGNOSIS — I509 Heart failure, unspecified: Secondary | ICD-10-CM | POA: Diagnosis present

## 2013-12-17 DIAGNOSIS — F419 Anxiety disorder, unspecified: Secondary | ICD-10-CM | POA: Diagnosis present

## 2013-12-17 DIAGNOSIS — I10 Essential (primary) hypertension: Secondary | ICD-10-CM | POA: Diagnosis present

## 2013-12-17 DIAGNOSIS — Z9889 Other specified postprocedural states: Secondary | ICD-10-CM | POA: Diagnosis not present

## 2013-12-17 DIAGNOSIS — Z9981 Dependence on supplemental oxygen: Secondary | ICD-10-CM

## 2013-12-17 DIAGNOSIS — Z7982 Long term (current) use of aspirin: Secondary | ICD-10-CM | POA: Diagnosis not present

## 2013-12-17 DIAGNOSIS — F329 Major depressive disorder, single episode, unspecified: Secondary | ICD-10-CM | POA: Diagnosis present

## 2013-12-17 DIAGNOSIS — I70412 Atherosclerosis of autologous vein bypass graft(s) of the extremities with intermittent claudication, left leg: Secondary | ICD-10-CM | POA: Diagnosis not present

## 2013-12-17 DIAGNOSIS — Z6841 Body Mass Index (BMI) 40.0 and over, adult: Secondary | ICD-10-CM

## 2013-12-17 DIAGNOSIS — I70222 Atherosclerosis of native arteries of extremities with rest pain, left leg: Principal | ICD-10-CM | POA: Diagnosis present

## 2013-12-17 DIAGNOSIS — D62 Acute posthemorrhagic anemia: Secondary | ICD-10-CM | POA: Diagnosis present

## 2013-12-17 DIAGNOSIS — J45909 Unspecified asthma, uncomplicated: Secondary | ICD-10-CM | POA: Diagnosis present

## 2013-12-17 DIAGNOSIS — E119 Type 2 diabetes mellitus without complications: Secondary | ICD-10-CM | POA: Diagnosis present

## 2013-12-17 DIAGNOSIS — L02224 Furuncle of groin: Secondary | ICD-10-CM | POA: Diagnosis not present

## 2013-12-17 DIAGNOSIS — I739 Peripheral vascular disease, unspecified: Secondary | ICD-10-CM

## 2013-12-17 DIAGNOSIS — K219 Gastro-esophageal reflux disease without esophagitis: Secondary | ICD-10-CM | POA: Diagnosis present

## 2013-12-17 DIAGNOSIS — G4733 Obstructive sleep apnea (adult) (pediatric): Secondary | ICD-10-CM | POA: Diagnosis present

## 2013-12-17 DIAGNOSIS — Z87891 Personal history of nicotine dependence: Secondary | ICD-10-CM | POA: Diagnosis not present

## 2013-12-17 DIAGNOSIS — Z794 Long term (current) use of insulin: Secondary | ICD-10-CM | POA: Diagnosis not present

## 2013-12-17 DIAGNOSIS — R200 Anesthesia of skin: Secondary | ICD-10-CM | POA: Diagnosis not present

## 2013-12-17 HISTORY — DX: Type 2 diabetes mellitus without complications: E11.9

## 2013-12-17 HISTORY — PX: FEMORAL ARTERY STENT: SHX1583

## 2013-12-17 HISTORY — DX: Unspecified chronic bronchitis: J42

## 2013-12-17 HISTORY — DX: Unspecified asthma, uncomplicated: J45.909

## 2013-12-17 HISTORY — DX: Dependence on other enabling machines and devices: Z99.89

## 2013-12-17 HISTORY — PX: FEMORAL-FEMORAL BYPASS GRAFT: SHX936

## 2013-12-17 HISTORY — DX: Obstructive sleep apnea (adult) (pediatric): G47.33

## 2013-12-17 HISTORY — PX: FEMORAL ARTERY - FEMORAL ARTERY BYPASS GRAFT: SUR179

## 2013-12-17 HISTORY — DX: Dependence on supplemental oxygen: Z99.81

## 2013-12-17 HISTORY — PX: INSERTION OF ILIAC STENT: SHX6256

## 2013-12-17 HISTORY — DX: Essential (primary) hypertension: I10

## 2013-12-17 LAB — GLUCOSE, CAPILLARY
GLUCOSE-CAPILLARY: 268 mg/dL — AB (ref 70–99)
Glucose-Capillary: 242 mg/dL — ABNORMAL HIGH (ref 70–99)
Glucose-Capillary: 301 mg/dL — ABNORMAL HIGH (ref 70–99)

## 2013-12-17 SURGERY — CREATION, BYPASS, ARTERIAL, FEMORAL TO FEMORAL, USING GRAFT
Anesthesia: General | Site: Leg Upper | Laterality: Right

## 2013-12-17 MED ORDER — ALBUMIN HUMAN 5 % IV SOLN
INTRAVENOUS | Status: DC | PRN
  Administered 2013-12-17 (×2): via INTRAVENOUS

## 2013-12-17 MED ORDER — MIDAZOLAM HCL 5 MG/5ML IJ SOLN
INTRAMUSCULAR | Status: DC | PRN
  Administered 2013-12-17 (×2): 1 mg via INTRAVENOUS

## 2013-12-17 MED ORDER — METOPROLOL TARTRATE 1 MG/ML IV SOLN: 2.0000 mg | INTRAVENOUS | Status: DC | PRN

## 2013-12-17 MED ORDER — DEXTROSE 5 % IV SOLN
1.5000 g | Freq: Two times a day (BID) | INTRAVENOUS | Status: AC
  Administered 2013-12-17 – 2013-12-18 (×2): 1.5 g via INTRAVENOUS
  Filled 2013-12-17 (×2): qty 1.5

## 2013-12-17 MED ORDER — OXYCODONE HCL 5 MG/5ML PO SOLN: 5.0000 mg | Freq: Once | ORAL | Status: DC | PRN

## 2013-12-17 MED ORDER — FENTANYL CITRATE 0.05 MG/ML IJ SOLN
25.0000 ug | INTRAMUSCULAR | Status: DC | PRN
  Administered 2013-12-17 (×2): 50 ug via INTRAVENOUS

## 2013-12-17 MED ORDER — TOPIRAMATE 25 MG PO TABS
50.0000 mg | ORAL_TABLET | Freq: Every day | ORAL | Status: DC
  Administered 2013-12-18 – 2013-12-21 (×4): 50 mg via ORAL
  Filled 2013-12-17 (×4): qty 2

## 2013-12-17 MED ORDER — ACETAMINOPHEN 650 MG RE SUPP: 325.0000 mg | RECTAL | Status: DC | PRN

## 2013-12-17 MED ORDER — CYCLOBENZAPRINE HCL 10 MG PO TABS: 10.0000 mg | ORAL_TABLET | Freq: Two times a day (BID) | ORAL | Status: DC | PRN

## 2013-12-17 MED ORDER — MAGNESIUM SULFATE 2 GM/50ML IV SOLN
2.0000 g | Freq: Every day | INTRAVENOUS | Status: DC | PRN
  Filled 2013-12-17: qty 50

## 2013-12-17 MED ORDER — ONDANSETRON HCL 4 MG/2ML IJ SOLN
INTRAMUSCULAR | Status: DC | PRN
  Administered 2013-12-17 (×2): 4 mg via INTRAVENOUS

## 2013-12-17 MED ORDER — HYDROMORPHONE HCL 1 MG/ML IJ SOLN
0.5000 mg | INTRAMUSCULAR | Status: DC | PRN
  Administered 2013-12-17 – 2013-12-19 (×6): 1 mg via INTRAVENOUS
  Filled 2013-12-17 (×6): qty 1

## 2013-12-17 MED ORDER — IODIXANOL 320 MG/ML IV SOLN
INTRAVENOUS | Status: DC | PRN
Start: 2013-12-17 — End: 2013-12-17
  Administered 2013-12-17: 20 mL via INTRAVENOUS

## 2013-12-17 MED ORDER — LACTATED RINGERS IV SOLN
INTRAVENOUS | Status: DC
  Administered 2013-12-17 (×2): via INTRAVENOUS

## 2013-12-17 MED ORDER — NEOSTIGMINE METHYLSULFATE 10 MG/10ML IV SOLN
INTRAVENOUS | Status: DC | PRN
  Administered 2013-12-17: 4 mg via INTRAVENOUS

## 2013-12-17 MED ORDER — SODIUM CHLORIDE 0.9 % IV SOLN
INTRAVENOUS | Status: DC
  Administered 2013-12-17: 20:00:00 via INTRAVENOUS
  Administered 2013-12-21: 1000 mL via INTRAVENOUS

## 2013-12-17 MED ORDER — PROPOFOL 10 MG/ML IV BOLUS
INTRAVENOUS | Status: AC
  Filled 2013-12-17: qty 20

## 2013-12-17 MED ORDER — FENTANYL CITRATE 0.05 MG/ML IJ SOLN
INTRAMUSCULAR | Status: DC | PRN
  Administered 2013-12-17 (×2): 50 ug via INTRAVENOUS
  Administered 2013-12-17: 200 ug via INTRAVENOUS
  Administered 2013-12-17 (×4): 50 ug via INTRAVENOUS

## 2013-12-17 MED ORDER — 0.9 % SODIUM CHLORIDE (POUR BTL) OPTIME
TOPICAL | Status: DC | PRN
  Administered 2013-12-17: 1000 mL

## 2013-12-17 MED ORDER — PROTAMINE SULFATE 10 MG/ML IV SOLN
INTRAVENOUS | Status: DC | PRN
  Administered 2013-12-17: 50 mg via INTRAVENOUS

## 2013-12-17 MED ORDER — GUAIFENESIN-DM 100-10 MG/5ML PO SYRP
15.0000 mL | ORAL_SOLUTION | ORAL | Status: DC | PRN
  Filled 2013-12-17: qty 15

## 2013-12-17 MED ORDER — ARTIFICIAL TEARS OP OINT
TOPICAL_OINTMENT | OPHTHALMIC | Status: AC
  Filled 2013-12-17: qty 3.5

## 2013-12-17 MED ORDER — FENTANYL CITRATE 0.05 MG/ML IJ SOLN
INTRAMUSCULAR | Status: AC
  Filled 2013-12-17: qty 5

## 2013-12-17 MED ORDER — DOCUSATE SODIUM 100 MG PO CAPS
100.0000 mg | ORAL_CAPSULE | Freq: Every day | ORAL | Status: DC
  Administered 2013-12-18 – 2013-12-21 (×4): 100 mg via ORAL
  Filled 2013-12-17 (×4): qty 1

## 2013-12-17 MED ORDER — PROPOFOL 10 MG/ML IV BOLUS
INTRAVENOUS | Status: DC | PRN
  Administered 2013-12-17: 120 mg via INTRAVENOUS

## 2013-12-17 MED ORDER — LISINOPRIL 2.5 MG PO TABS
2.5000 mg | ORAL_TABLET | Freq: Every day | ORAL | Status: DC
  Administered 2013-12-18 – 2013-12-21 (×4): 2.5 mg via ORAL
  Filled 2013-12-17 (×4): qty 1

## 2013-12-17 MED ORDER — BISACODYL 10 MG RE SUPP: 10.0000 mg | Freq: Every day | RECTAL | Status: DC | PRN

## 2013-12-17 MED ORDER — FENTANYL CITRATE 0.05 MG/ML IJ SOLN
INTRAMUSCULAR | Status: AC
  Administered 2013-12-17: 50 ug via INTRAVENOUS
  Filled 2013-12-17: qty 2

## 2013-12-17 MED ORDER — POTASSIUM CHLORIDE CRYS ER 20 MEQ PO TBCR: 20.0000 meq | EXTENDED_RELEASE_TABLET | Freq: Every day | ORAL | Status: DC | PRN

## 2013-12-17 MED ORDER — ATORVASTATIN CALCIUM 20 MG PO TABS
20.0000 mg | ORAL_TABLET | Freq: Every day | ORAL | Status: DC
  Administered 2013-12-18 – 2013-12-21 (×4): 20 mg via ORAL
  Filled 2013-12-17 (×4): qty 1

## 2013-12-17 MED ORDER — ALBUTEROL SULFATE (2.5 MG/3ML) 0.083% IN NEBU: 3.0000 mL | INHALATION_SOLUTION | Freq: Four times a day (QID) | RESPIRATORY_TRACT | Status: DC | PRN

## 2013-12-17 MED ORDER — LACTATED RINGERS IV SOLN
INTRAVENOUS | Status: DC | PRN
  Administered 2013-12-17 (×3): via INTRAVENOUS

## 2013-12-17 MED ORDER — PANTOPRAZOLE SODIUM 40 MG PO TBEC
40.0000 mg | DELAYED_RELEASE_TABLET | Freq: Every day | ORAL | Status: DC
  Administered 2013-12-17 – 2013-12-21 (×5): 40 mg via ORAL
  Filled 2013-12-17 (×5): qty 1

## 2013-12-17 MED ORDER — ONDANSETRON HCL 4 MG/2ML IJ SOLN
INTRAMUSCULAR | Status: AC
  Filled 2013-12-17: qty 2

## 2013-12-17 MED ORDER — VECURONIUM BROMIDE 10 MG IV SOLR
INTRAVENOUS | Status: DC | PRN
  Administered 2013-12-17 (×3): 2 mg via INTRAVENOUS
  Administered 2013-12-17: 1 mg via INTRAVENOUS
  Administered 2013-12-17 (×3): 2 mg via INTRAVENOUS

## 2013-12-17 MED ORDER — FENTANYL CITRATE 0.05 MG/ML IJ SOLN: 25.0000 ug | INTRAMUSCULAR | Status: DC | PRN

## 2013-12-17 MED ORDER — PHENYLEPHRINE HCL 10 MG/ML IJ SOLN
10.0000 mg | INTRAVENOUS | Status: DC | PRN
  Administered 2013-12-17: 15 ug/min via INTRAVENOUS

## 2013-12-17 MED ORDER — PHENYLEPHRINE HCL 10 MG/ML IJ SOLN
INTRAMUSCULAR | Status: DC | PRN
  Administered 2013-12-17: 80 ug via INTRAVENOUS

## 2013-12-17 MED ORDER — MIDAZOLAM HCL 2 MG/2ML IJ SOLN: 0.5000 mg | Freq: Once | INTRAMUSCULAR | Status: DC | PRN

## 2013-12-17 MED ORDER — PROMETHAZINE HCL 25 MG/ML IJ SOLN
INTRAMUSCULAR | Status: AC
  Filled 2013-12-17: qty 1

## 2013-12-17 MED ORDER — DULOXETINE HCL 60 MG PO CPEP
60.0000 mg | ORAL_CAPSULE | Freq: Every day | ORAL | Status: DC
  Administered 2013-12-17 – 2013-12-21 (×5): 60 mg via ORAL
  Filled 2013-12-17 (×5): qty 1

## 2013-12-17 MED ORDER — LABETALOL HCL 5 MG/ML IV SOLN
10.0000 mg | INTRAVENOUS | Status: DC | PRN
  Filled 2013-12-17: qty 4

## 2013-12-17 MED ORDER — ARTIFICIAL TEARS OP OINT
TOPICAL_OINTMENT | OPHTHALMIC | Status: DC | PRN
  Administered 2013-12-17: 1 via OPHTHALMIC

## 2013-12-17 MED ORDER — CHLORHEXIDINE GLUCONATE 4 % EX LIQD
60.0000 mL | Freq: Once | CUTANEOUS | Status: DC
  Filled 2013-12-17: qty 60

## 2013-12-17 MED ORDER — PROMETHAZINE HCL 25 MG PO TABS
25.0000 mg | ORAL_TABLET | Freq: Four times a day (QID) | ORAL | Status: DC | PRN
Start: 2013-12-17 — End: 2013-12-21
  Administered 2013-12-18 (×2): 25 mg via ORAL
  Filled 2013-12-17 (×2): qty 1

## 2013-12-17 MED ORDER — MEPERIDINE HCL 25 MG/ML IJ SOLN: 6.2500 mg | INTRAMUSCULAR | Status: DC | PRN

## 2013-12-17 MED ORDER — INSULIN DETEMIR 100 UNIT/ML ~~LOC~~ SOLN
64.0000 [IU] | Freq: Every day | SUBCUTANEOUS | Status: DC
  Administered 2013-12-18 – 2013-12-21 (×4): 64 [IU] via SUBCUTANEOUS
  Filled 2013-12-17 (×4): qty 0.64

## 2013-12-17 MED ORDER — GLYCOPYRROLATE 0.2 MG/ML IJ SOLN
INTRAMUSCULAR | Status: DC | PRN
  Administered 2013-12-17: 0.6 mg via INTRAVENOUS

## 2013-12-17 MED ORDER — LINAGLIPTIN 5 MG PO TABS
5.0000 mg | ORAL_TABLET | Freq: Every day | ORAL | Status: DC
  Administered 2013-12-18 – 2013-12-21 (×4): 5 mg via ORAL
  Filled 2013-12-17 (×4): qty 1

## 2013-12-17 MED ORDER — ACETAMINOPHEN 325 MG PO TABS: 325.0000 mg | ORAL_TABLET | ORAL | Status: DC | PRN

## 2013-12-17 MED ORDER — ASPIRIN EC 325 MG PO TBEC
325.0000 mg | DELAYED_RELEASE_TABLET | Freq: Every day | ORAL | Status: DC
  Administered 2013-12-17 – 2013-12-21 (×5): 325 mg via ORAL
  Filled 2013-12-17 (×5): qty 1

## 2013-12-17 MED ORDER — LOSARTAN POTASSIUM 50 MG PO TABS
50.0000 mg | ORAL_TABLET | Freq: Every day | ORAL | Status: DC
  Administered 2013-12-18: 50 mg via ORAL
  Filled 2013-12-17: qty 1

## 2013-12-17 MED ORDER — ROCURONIUM BROMIDE 100 MG/10ML IV SOLN
INTRAVENOUS | Status: DC | PRN
  Administered 2013-12-17: 10 mg via INTRAVENOUS
  Administered 2013-12-17: 40 mg via INTRAVENOUS

## 2013-12-17 MED ORDER — MIDAZOLAM HCL 2 MG/2ML IJ SOLN
INTRAMUSCULAR | Status: AC
  Filled 2013-12-17: qty 2

## 2013-12-17 MED ORDER — DIPHENHYDRAMINE HCL 50 MG/ML IJ SOLN: 10.0000 mg | Freq: Once | INTRAMUSCULAR | Status: DC

## 2013-12-17 MED ORDER — ROCURONIUM BROMIDE 50 MG/5ML IV SOLN
INTRAVENOUS | Status: AC
  Filled 2013-12-17: qty 1

## 2013-12-17 MED ORDER — PROMETHAZINE HCL 25 MG/ML IJ SOLN
6.2500 mg | INTRAMUSCULAR | Status: DC | PRN
  Administered 2013-12-17: 6.25 mg via INTRAVENOUS

## 2013-12-17 MED ORDER — ENOXAPARIN SODIUM 30 MG/0.3ML ~~LOC~~ SOLN
30.0000 mg | SUBCUTANEOUS | Status: DC
  Administered 2013-12-18 – 2013-12-21 (×4): 30 mg via SUBCUTANEOUS
  Filled 2013-12-17 (×5): qty 0.3

## 2013-12-17 MED ORDER — OXYCODONE HCL 5 MG PO TABS: 5.0000 mg | ORAL_TABLET | Freq: Once | ORAL | Status: DC | PRN

## 2013-12-17 MED ORDER — LACTATED RINGERS IV SOLN
INTRAVENOUS | Status: DC | PRN
  Administered 2013-12-17 (×2): via INTRAVENOUS

## 2013-12-17 MED ORDER — PHENOL 1.4 % MT LIQD: 1.0000 | OROMUCOSAL | Status: DC | PRN

## 2013-12-17 MED ORDER — HYDRALAZINE HCL 20 MG/ML IJ SOLN: 5.0000 mg | INTRAMUSCULAR | Status: DC | PRN

## 2013-12-17 MED ORDER — GLYCOPYRROLATE 0.2 MG/ML IJ SOLN
INTRAMUSCULAR | Status: AC
  Filled 2013-12-17: qty 2

## 2013-12-17 MED ORDER — ALUM & MAG HYDROXIDE-SIMETH 200-200-20 MG/5ML PO SUSP: 15.0000 mL | ORAL | Status: DC | PRN

## 2013-12-17 MED ORDER — OXYCODONE-ACETAMINOPHEN 5-325 MG PO TABS
1.0000 | ORAL_TABLET | ORAL | Status: DC | PRN
  Administered 2013-12-18 (×2): 1 via ORAL
  Administered 2013-12-19: 2 via ORAL
  Administered 2013-12-19: 1 via ORAL
  Administered 2013-12-19 – 2013-12-21 (×7): 2 via ORAL
  Filled 2013-12-17 (×2): qty 2
  Filled 2013-12-17: qty 1
  Filled 2013-12-17 (×2): qty 2
  Filled 2013-12-17: qty 1
  Filled 2013-12-17 (×4): qty 2
  Filled 2013-12-17: qty 1

## 2013-12-17 MED ORDER — HEMOSTATIC AGENTS (NO CHARGE) OPTIME
TOPICAL | Status: DC | PRN
  Administered 2013-12-17: 1 via TOPICAL

## 2013-12-17 MED ORDER — SODIUM CHLORIDE 0.9 % IR SOLN
Status: DC | PRN
  Administered 2013-12-17 (×2): 500 mL

## 2013-12-17 MED ORDER — PROMETHAZINE HCL 25 MG/ML IJ SOLN: 6.2500 mg | INTRAMUSCULAR | Status: DC | PRN

## 2013-12-17 MED ORDER — SODIUM CHLORIDE 0.9 % IV SOLN: 500.0000 mL | Freq: Once | INTRAVENOUS | Status: AC | PRN

## 2013-12-17 MED ORDER — SENNOSIDES-DOCUSATE SODIUM 8.6-50 MG PO TABS
1.0000 | ORAL_TABLET | Freq: Every evening | ORAL | Status: DC | PRN
  Filled 2013-12-17: qty 1

## 2013-12-17 MED ORDER — BUPROPION HCL ER (XL) 300 MG PO TB24
300.0000 mg | ORAL_TABLET | Freq: Every day | ORAL | Status: DC
  Administered 2013-12-18 – 2013-12-21 (×4): 300 mg via ORAL
  Filled 2013-12-17 (×4): qty 1

## 2013-12-17 MED ORDER — ONDANSETRON HCL 4 MG/2ML IJ SOLN
4.0000 mg | Freq: Four times a day (QID) | INTRAMUSCULAR | Status: DC | PRN
  Administered 2013-12-17 – 2013-12-21 (×2): 4 mg via INTRAVENOUS
  Filled 2013-12-17 (×2): qty 2

## 2013-12-17 MED ORDER — HEPARIN SODIUM (PORCINE) 1000 UNIT/ML IJ SOLN
INTRAMUSCULAR | Status: DC | PRN
  Administered 2013-12-17: 2000 [IU] via INTRAVENOUS
  Administered 2013-12-17 (×2): 1000 [IU] via INTRAVENOUS
  Administered 2013-12-17: 10000 [IU] via INTRAVENOUS

## 2013-12-17 SURGICAL SUPPLY — 80 items
ADH SKN CLS APL DERMABOND .7 (GAUZE/BANDAGES/DRESSINGS) ×2
BAG BANDED W/RUBBER/TAPE 36X54 (MISCELLANEOUS) ×2 IMPLANT
BAG EQP BAND 135X91 W/RBR TAPE (MISCELLANEOUS) ×2
BAG SNAP BAND KOVER 36X36 (MISCELLANEOUS) ×2 IMPLANT
BALLN MUSTANG 5X100X135 (BALLOONS) ×4
BALLOON MUSTANG 5X100X135 (BALLOONS) IMPLANT
CANISTER SUCTION 2500CC (MISCELLANEOUS) ×4 IMPLANT
CANISTER WOUND CARE 500ML ATS (WOUND CARE) ×2 IMPLANT
CATH EMB 3FR 80CM (CATHETERS) ×2 IMPLANT
CLIP TI MEDIUM 24 (CLIP) ×4 IMPLANT
CLIP TI WIDE RED SMALL 24 (CLIP) ×4 IMPLANT
CONNECTOR Y ATS VAC SYSTEM (MISCELLANEOUS) ×2 IMPLANT
COVER BACK TABLE 80X110 HD (DRAPES) ×2 IMPLANT
COVER DOME SNAP 22 D (MISCELLANEOUS) ×2 IMPLANT
COVER MAYO STAND STRL (DRAPES) ×2 IMPLANT
DERMABOND ADVANCED (GAUZE/BANDAGES/DRESSINGS) ×2
DERMABOND ADVANCED .7 DNX12 (GAUZE/BANDAGES/DRESSINGS) ×2 IMPLANT
DRAIN CHANNEL 15F RND FF W/TCR (WOUND CARE) IMPLANT
DRAPE INCISE IOBAN 66X45 STRL (DRAPES) ×2 IMPLANT
DRSG VAC ATS SM SENSATRAC (GAUZE/BANDAGES/DRESSINGS) ×2 IMPLANT
ELECT BLADE 4.0 EZ CLEAN MEGAD (MISCELLANEOUS) ×4
ELECT CAUTERY BLADE 6.4 (BLADE) ×2 IMPLANT
ELECT REM PT RETURN 9FT ADLT (ELECTROSURGICAL) ×8
ELECTRODE BLDE 4.0 EZ CLN MEGD (MISCELLANEOUS) IMPLANT
ELECTRODE REM PT RTRN 9FT ADLT (ELECTROSURGICAL) ×2 IMPLANT
EVACUATOR SILICONE 100CC (DRAIN) IMPLANT
GLOVE BIO SURGEON STRL SZ 6.5 (GLOVE) ×1 IMPLANT
GLOVE BIO SURGEON STRL SZ7.5 (GLOVE) ×2 IMPLANT
GLOVE BIO SURGEONS STRL SZ 6.5 (GLOVE) ×1
GLOVE BIOGEL PI IND STRL 6.5 (GLOVE) IMPLANT
GLOVE BIOGEL PI IND STRL 7.5 (GLOVE) ×2 IMPLANT
GLOVE BIOGEL PI IND STRL 8 (GLOVE) IMPLANT
GLOVE BIOGEL PI INDICATOR 6.5 (GLOVE) ×12
GLOVE BIOGEL PI INDICATOR 7.5 (GLOVE) ×16
GLOVE BIOGEL PI INDICATOR 8 (GLOVE) ×2
GLOVE ECLIPSE 6.5 STRL STRAW (GLOVE) ×6 IMPLANT
GLOVE SS BIOGEL STRL SZ 7 (GLOVE) IMPLANT
GLOVE SUPERSENSE BIOGEL SZ 7 (GLOVE) ×2
GLOVE SURG SS PI 6.5 STRL IVOR (GLOVE) ×2 IMPLANT
GLOVE SURG SS PI 7.0 STRL IVOR (GLOVE) ×6 IMPLANT
GOWN STRL REUS W/ TWL LRG LVL3 (GOWN DISPOSABLE) ×4 IMPLANT
GOWN STRL REUS W/ TWL XL LVL3 (GOWN DISPOSABLE) ×2 IMPLANT
GOWN STRL REUS W/TWL LRG LVL3 (GOWN DISPOSABLE) ×4
GOWN STRL REUS W/TWL XL LVL3 (GOWN DISPOSABLE) ×24
GRAFT HEMASHIELD 8MM (Vascular Products) ×4 IMPLANT
GRAFT VASC STRG 30X8KNIT (Vascular Products) IMPLANT
HEMOSTAT SNOW SURGICEL 2X4 (HEMOSTASIS) ×2 IMPLANT
KIT BASIN OR (CUSTOM PROCEDURE TRAY) ×4 IMPLANT
KIT ENCORE 26 ADVANTAGE (KITS) ×2 IMPLANT
KIT ROOM TURNOVER OR (KITS) ×4 IMPLANT
NDL PERC 18GX7CM (NEEDLE) IMPLANT
NEEDLE PERC 18GX7CM (NEEDLE) ×4 IMPLANT
NS IRRIG 1000ML POUR BTL (IV SOLUTION) ×8 IMPLANT
PACK PERIPHERAL VASCULAR (CUSTOM PROCEDURE TRAY) ×4 IMPLANT
PAD ARMBOARD 7.5X6 YLW CONV (MISCELLANEOUS) ×8 IMPLANT
PAD ELECT DEFIB RADIOL ZOLL (MISCELLANEOUS) ×2 IMPLANT
PAD NEG PRESSURE SENSATRAC (MISCELLANEOUS) ×2 IMPLANT
PENCIL BUTTON HOLSTER BLD 10FT (ELECTRODE) ×2 IMPLANT
PROBE PENCIL 8 MHZ STRL DISP (MISCELLANEOUS) IMPLANT
SHEATH FLEXOR ANSEL 1 7F 45CM (SHEATH) ×2 IMPLANT
SPONGE INTESTINAL PEANUT (DISPOSABLE) ×2 IMPLANT
SPONGE LAP 18X18 X RAY DECT (DISPOSABLE) ×2 IMPLANT
SPONGE LAP 4X18 X RAY DECT (DISPOSABLE) ×2 IMPLANT
STAPLER VISISTAT 35W (STAPLE) ×2 IMPLANT
STENT SMART FLEX 6X100X120 (Permanent Stent) ×2 IMPLANT
SUT PROLENE 5 0 C 1 24 (SUTURE) ×20 IMPLANT
SUT PROLENE 6 0 BV (SUTURE) ×4 IMPLANT
SUT VIC AB 2-0 CT1 27 (SUTURE) ×12
SUT VIC AB 2-0 CT1 TAPERPNT 27 (SUTURE) ×4 IMPLANT
SUT VIC AB 3-0 SH 27 (SUTURE) ×8
SUT VIC AB 3-0 SH 27X BRD (SUTURE) ×4 IMPLANT
SUT VICRYL 4-0 PS2 18IN ABS (SUTURE) IMPLANT
SYR TB 1ML LUER SLIP (SYRINGE) ×2 IMPLANT
SYRINGE 10CC LL (SYRINGE) ×4 IMPLANT
TOWEL OR 17X24 6PK STRL BLUE (TOWEL DISPOSABLE) ×8 IMPLANT
TRAY FOLEY CATH 16FRSI W/METER (SET/KITS/TRAYS/PACK) ×4 IMPLANT
TUBE CONNECTING 12'X1/4 (SUCTIONS) ×1
TUBE CONNECTING 12X1/4 (SUCTIONS) ×1 IMPLANT
WATER STERILE IRR 1000ML POUR (IV SOLUTION) ×4 IMPLANT
WIRE HI TORQ VERSACORE J 260CM (WIRE) ×2 IMPLANT

## 2013-12-17 NOTE — H&P (View-Only) (Signed)
Patient name: Stephanie Peck MRN: [id number] DOB: 22-Aug-1960 Sex: female     Chief Complaint  Patient presents with  . PVD    Discuss surgery    HISTORY OF PRESENT ILLNESS: The patient is here today for followup. On 04/07/2012 she underwent redo left femoral endarterectomy with patch angioplasty. I previously performed a left femoral endarterectomy and this has occluded. She came in with numbness to her left foot. Dr. Migdalia Dk provided a muscle flap for wound coverage. The patient's numbness resolved after her operation.she recently began complaining of numbness in her left foot.  She underwent angiography which reveals an occluded left external iliac artery.  She reports that she cannot walk very far before she gets pain in her feet.  Her foot remains numb.  She is intolerant of Plavix.  Earlier this year she suffered a bout of sepsis where she had acute renal failure.  This has resulted.  Past Medical History  Diagnosis Date  . Diabetes mellitus   . GERD (gastroesophageal reflux disease)   . Anxiety   . CHF (congestive heart failure)   . COPD (chronic obstructive pulmonary disease)   . Sleep apnea     severe OSA by 09/08/06 sleep study ([facility])  . Depression   . Morbid obesity     Past Surgical History  Procedure Laterality Date  . Cholecystectomy    . Gastric bypass  2006  . Tonsillectomy    . Femoral artery stent  12/05/2011    right superficial   . Endarterectomy femoral  02/22/2012    Procedure: ENDARTERECTOMY FEMORAL;  Surgeon: Serafina Mitchell, MD;  Location: [place];  Service: Vascular;  Laterality: Left;  . Patch angioplasty  02/22/2012    Procedure: PATCH ANGIOPLASTY;  Surgeon: Serafina Mitchell, MD;  Location: [facility];  Service: Vascular;  Laterality: Left;  left femoral patch angioplasty  . Application of wound vac  02/22/2012    Procedure: APPLICATION OF WOUND VAC;  Surgeon: Serafina Mitchell, MD;  Location: [place];  Service: Vascular;  Laterality: Left;  application wound vac    . Groin debridement  03/01/2012    Procedure: GROIN DEBRIDEMENT;  Surgeon: Rosetta Posner, MD;  Location: [place];  Service: Vascular;  Laterality: Left;  . Aortogram Left 04/04/2012    Procedure: AORTOGRAM;  Surgeon: Serafina Mitchell, MD;  Location: [place];  Service: Vascular;  Laterality: Left;  . Patch angioplasty Left 04/04/2012    Procedure: PATCH ANGIOPLASTY;  Surgeon: Serafina Mitchell, MD;  Location: [place];  Service: Vascular;  Laterality: Left;  Marland Kitchen Muscle flap closure Left 04/04/2012    Procedure: MUSCLE FLAP CLOSURE;  Surgeon: Theodoro Kos, DO;  Location: [place];  Service: Plastics;  Laterality: Left;  . Irrigation and debridement abscess Left 03/05/2013    Procedure: IRRIGATION AND DEBRIDEMENT ABSCESS;  Surgeon: Zenovia Jarred, MD;  Location: [place];  Service: General;  Laterality: Left;    History   Social History  . Marital Status: Married    Spouse Name: N/A    Number of Children: N/A  . Years of Education: N/A   Occupational History  . Not on file.   Social History Main Topics  . Smoking status: Former Smoker -- 2.00 packs/day for 20 years    Types: Cigarettes    Start date: 09/30/2011    Quit date: 10/30/2011  . Smokeless tobacco: Never Used  . Alcohol Use: No  . Drug Use: No  . Sexual Activity:  Yes    Birth Control/ Protection: Post-menopausal   Other Topics Concern  . Not on file   Social History Narrative    Family History  Problem Relation Age of Onset  . Cancer Mother   . Depression Mother   . Heart disease Father     before age 27  . Diabetes Father   . Hypertension Father   . Hyperlipidemia Father   . Heart attack Father   . Cancer Maternal Grandmother     Allergies as of 11/30/2013 - Review Complete 11/30/2013  Allergen Reaction Noted  . Celebrex [celecoxib] Shortness Of Breath 03/05/2013  . Codeine Nausea Only     Current Outpatient Prescriptions on File Prior to Visit  Medication Sig Dispense Refill  . albuterol (PROVENTIL HFA;VENTOLIN HFA) 108  (90 BASE) MCG/ACT inhaler Inhale 1 puff into the lungs every 6 (six) hours as needed for wheezing or shortness of breath.    Marland Kitchen aspirin EC 325 MG EC tablet Take 1 tablet (325 mg total) by mouth daily. 30 tablet 0  . atorvastatin (LIPITOR) 20 MG tablet Take 1 tablet (20 mg total) by mouth daily. 90 tablet 3  . BD PEN NEEDLE NANO U/F 32G X 4 MM MISC     . buPROPion (WELLBUTRIN XL) 300 MG 24 hr tablet Take 300 mg by mouth daily.    . DULoxetine (CYMBALTA) 60 MG capsule Take 60 mg by mouth daily.    . Ferrous Sulfate (SLOW FE PO) Take 1 tablet by mouth daily.    . insulin aspart (NOVOLOG) 100 UNIT/ML injection Inject 14 Units into the skin at bedtime.     . insulin detemir (LEVEMIR) 100 UNIT/ML injection Inject 64 Units into the skin daily.    Marland Kitchen JANUVIA 50 MG tablet Take 50 mg by mouth daily.     Marland Kitchen lisinopril (PRINIVIL,ZESTRIL) 2.5 MG tablet Take 2.5 mg by mouth daily.     Marland Kitchen losartan (COZAAR) 50 MG tablet Take 50 mg by mouth daily.    . Multiple Vitamins-Minerals (MULTIVITAMIN WITH MINERALS) tablet Take 1 tablet by mouth daily.    Marland Kitchen omeprazole (PRILOSEC) 40 MG capsule Take 40 mg by mouth 2 (two) times daily.    Marland Kitchen OVER THE COUNTER MEDICATION Take 630 mg by mouth daily. OTC Calcium    . topiramate (TOPAMAX) 50 MG tablet Take 1 tablet (50 mg total) by mouth daily. 30 tablet 3  . vitamin B-12 (CYANOCOBALAMIN) 500 MCG tablet Take 500 mcg by mouth daily.     No current facility-administered medications on file prior to visit.     REVIEW OF SYSTEMS: No changes from her most recent clinic visit  PHYSICAL EXAMINATION:   Vital signs are BP 151/72 mmHg  Pulse 104  Temp(Src) 98.9 F (37.2 C) (Oral)  Resp 18  Ht 5\' 7"  (1.702 m)  Wt 271 lb 1.6 oz (122.97 kg)  BMI 42.45 kg/m2  SpO2 99% General: The patient appears their stated age. HEENT:  No gross abnormalities Pulmonary:  Non labored breathing Abdomen: Soft and non-tender, obese Musculoskeletal: There are no major deformities. Neurologic: No  focal weakness or paresthesias are detected, Skin: There are no ulcer or rashes noted. Psychiatric: The patient has normal affect. Cardiovascular: palpable right femoral pulse.  Pedal pulses are palpable.   Diagnostic Studies none  Assessment: Peripheral vascular disease with rest pain, left leg Plan: I discussed the angiographic findings today with the patient.  She has severe pain in her left leg as well as numbness in  her foot.  This is significantly limiting her activity level.  I think the best course of action is to proceed with a right to left femoral-femoral bypass graft.  The patient is also complaining of pain in her right leg with walking.  Angiography revealed a diffusely diseased superficial femoral artery.  If her femoral-femoral bypass graft goes smoothly, I would consider cannulating the bypass graft and percutaneously treating her right superficial femoral artery.  I discussed with the patient that I may not do this if it takes a long time to do her operation.  She continues to be at risk for groin infection and wound complications, particularly given the fact that she is artery had a muscle flap to her left groin at her initial operation.  This operation has been scheduled for Thursday, November 20.I am going to get cardiac clearance from Dr. Marlou Porch.  Eldridge Abrahams, M.D. [facility] [place] Office: [phone] Pager:  [phone]

## 2013-12-17 NOTE — Anesthesia Preprocedure Evaluation (Addendum)
Anesthesia Evaluation  Patient identified by MRN, date of birth, ID band Patient awake    Reviewed: Allergy & Precautions, H&P , NPO status , Patient's Chart, lab work & pertinent test results  History of Anesthesia Complications Negative for: history of anesthetic complications  Airway Mallampati: II  TM Distance: >3 FB Neck ROM: Full    Dental  (+) Edentulous Upper, Poor Dentition, Missing, Dental Advisory Given   Pulmonary sleep apnea, Continuous Positive Airway Pressure Ventilation and Oxygen sleep apnea , COPDformer smoker (quit 2013),  breath sounds clear to auscultation        Cardiovascular hypertension, Pt. on medications - angina+ Peripheral Vascular Disease Rhythm:Regular Rate:Normal  2/15 ECHO: EF 55-60%, very mild AS '13 nuclear stress test: low risk   Neuro/Psych Anxiety Depression negative neurological ROS     GI/Hepatic Neg liver ROS, GERD-  Controlled,  Endo/Other  diabetes (glu 268), Insulin DependentMorbid obesity  Renal/GU Renal InsufficiencyRenal disease (creat 1.48)     Musculoskeletal   Abdominal (+) + obese,   Peds  Hematology   Anesthesia Other Findings   Reproductive/Obstetrics                           Anesthesia Physical Anesthesia Plan  ASA: III  Anesthesia Plan: General   Post-op Pain Management:    Induction: Intravenous  Airway Management Planned: Oral ETT  Additional Equipment:   Intra-op Plan:   Post-operative Plan: Extubation in OR  Informed Consent: I have reviewed the patients History and Physical, chart, labs and discussed the procedure including the risks, benefits and alternatives for the proposed anesthesia with the patient or authorized representative who has indicated his/her understanding and acceptance.   Dental advisory given  Plan Discussed with: Surgeon and CRNA  Anesthesia Plan Comments: (Plan routine monitors, GETA)         Anesthesia Quick Evaluation

## 2013-12-17 NOTE — Interval H&P Note (Signed)
History and Physical Interval Note:  12/17/2013 9:34 AM  Stephanie Peck  has presented today for surgery, with the diagnosis of Peripheral Vascular Disease with rest pain left lower extremity Claudication right lower extremity  The various methods of treatment have been discussed with the patient and family. After consideration of risks, benefits and other options for treatment, the patient has consented to  Procedure(s): BYPASS GRAFT FEMORAL-FEMORAL ARTERY-RIGHT TO LEFT (N/A) Possible Insertion of Right Lower Extremity Stent (Right) as a surgical intervention .  The patient's history has been reviewed, patient examined, no change in status, stable for surgery.  I have reviewed the patient's chart and labs.  Questions were answered to the patient's satisfaction.     BRABHAM IV, V. WELLS

## 2013-12-17 NOTE — Anesthesia Postprocedure Evaluation (Signed)
  Anesthesia Post-op Note  Patient: Stephanie Peck  Procedure(s) Performed: Procedure(s): BYPASS GRAFT FEMORAL-FEMORAL ARTERY-RIGHT TO LEFT (N/A)  Insertion of Stent Right Superficial Femoral Artery  (Right)  Patient Location: PACU  Anesthesia Type:General  Level of Consciousness: sedated, patient cooperative and responds to stimulation  Airway and Oxygen Therapy: Patient Spontanous Breathing and Patient connected to nasal cannula oxygen  Post-op Pain: none  Post-op Assessment: Post-op Vital signs reviewed, Patient's Cardiovascular Status Stable, Respiratory Function Stable, Patent Airway, No signs of Nausea or vomiting and Pain level controlled  Post-op Vital Signs: Reviewed and stable  Last Vitals:  Filed Vitals:   12/17/13 1652  BP: 116/44  Pulse: 89  Temp:   Resp: 14    Complications: No apparent anesthesia complications

## 2013-12-17 NOTE — Transfer of Care (Signed)
Immediate Anesthesia Transfer of Care Note  Patient: Stephanie Peck  Procedure(s) Performed: Procedure(s): BYPASS GRAFT FEMORAL-FEMORAL ARTERY-RIGHT TO LEFT (N/A)  Insertion of Stent Right Superficial Femoral Artery  (Right)  Patient Location: PACU  Anesthesia Type:General  Level of Consciousness: awake, alert  and oriented  Airway & Oxygen Therapy: Patient Spontanous Breathing and Patient connected to face mask oxygen  Post-op Assessment: Report given to PACU RN  Post vital signs: Reviewed and stable  Complications: No apparent anesthesia complications

## 2013-12-17 NOTE — Op Note (Signed)
Patient name: Stephanie Peck MRN: [id number] DOB: [birth year] Sex: female  12/17/2013 Pre-operative Diagnosis: Ischemic rest pain, left leg.   Lifestyle limiting claudication, right leg Post-operative diagnosis:  Same Surgeon:  Eldridge Abrahams Assistants:  Gae Gallop, Gerri Lins Procedure:   #1: Right to left femoral-femoral bypass graft (8 mm dacryon)   #2: Redo left common femoral artery exposure   #3: Right lower extremity angiogram   #4: Stent, right superficial femoral artery   #5: Thrombectomy, left superficial femoral artery   #6: Placement of bilateral incisional wound VAC Anesthesia:  Gen. Blood Loss:  See anesthesia record Specimens:  None  Findings:  Cordis 6 x 100 self-expanding stents to the right superficial femoral artery.  8 mm dacryon graft for the femoral-femoral bypass graft.  Because of the patient's body habitus this required anastomosis to the distal common femoral artery on the left which was the previously placed patch.  On the right and oblique incision was made and the anastomosis was to the common femoral artery.  Indications:  The patient has previously undergone stenting to her right superficial femoral artery, and left external iliac artery.  She then had a left common femoral endarterectomy and patch angioplasty.  This occluded and required a redo femoral endarterectomy with patch angioplasty.  The patient developed wound complications necessitating a muscle flap by Dr. Migdalia Dk.  She was ultimately able to heal this.  Unfortunately several weeks ago she developed numbness in her left foot and was found to have an occluded left external iliac artery.  She comes in today for revascularization  Procedure:  The patient was identified in the holding area and taken to [facility]  The patient was then placed supine on the table. general anesthesia was administered.  The patient was prepped and draped in the usual sterile fashion.  A time out was called  and antibiotics were administered.  An oblique incision was made in the right groin.  Cautery was used to divide subcutaneous tissue down to the femoral sheath.  The femoral sheath was then opened sharply and the common femoral artery was exposed from the inguinal ligament down to just above the bifurcation.  The patient's previously left femoral incision was opened.  Cautery was used to divide the subcutaneous tissue until I encountered her muscle flap.  I then identified the medial border of the muscle flap and then rotated it laterally so as not to disrupt the blood supply.  I then identified the common femoral artery.  This was circumferentially dissected free.  I also dissected out the superficial femoral and profunda femoral artery.  Once adequate exposure was obtained, a tunnel was created anterior to the rectus fascia between the 2 incisions.  The patient was fully heparinized.  I did the left femoral anastomosis first.  After occluding the vessels, I opened the previously placed patch, and the distal common femoral and proximal superficial femoral artery.  I did this lower than normal so that the graft would not take 2 sharp of a turn because of her body habitus.  There was thrombus within the common femoral artery.  I passed a Fogarty catheter oh (#3) down the superficial femoral artery and did evacuate some thrombus.  Once this was done there was good backbleeding.  I then made a negative past.  I passed the Fogarty catheter down the profunda femoral artery and did not evacuate any clot.  There was good backbleeding.  An 8 mm dacryon  graft which had previously been brought through the tunnel, was then beveled to fit the size of the arteriotomy.  A running anastomosis was created with 5-0 Prolene.  I did not complete the anastomosis but rather turned my attention towards the right groin.  The common femoral artery was exposed.  It was occluded proximally and distally with vascular clamps.  A #11 blade was  used to make an arteriotomy which was extended longitudinally with Potts scissors.  The graft was cut to the appropriate length and then spatulated.  A running anastomosis was created with 5-0 Prolene.  Prior to completion the periphery flushing maneuvers were performed the anastomosis was completed.  There was excellent flow through the graft into the left groin.  I then flushed the superficial femoral profunda femoral artery on the left.  The femorofemoral was also flushed.  The anastomosis was then completed.  Hand-held Doppler was used to evaluate the signals in the superficial femoral and profunda femoral artery on the left.  These were multiphasic and graft dependent.  Similarly there was a good signal distal to the anastomosis on the right.  At this point I elected to proceed with intervention on the right leg.  I used an 18-gauge needle to cannulate the femoral-femoral bypass graft in the left groin.  A woolly wire was then advanced through the graft followed by a 7 French 45 cm antral 1 sheath.  The woolly wire was easily navigated through the superficial femoral artery.  I then performed right lower extremity angiogram identifying the stenosis within the right superficial femoral artery.  I selected a Cordis 6 x 100 self-expanding stent.  This was deployed across the stenosis and then molded with a 5 mm balloon.  Completion imaging showed excellent result.  There was some spasm proximal to the stent.  Runoff to the foot was unchanged.  At this point the sheath was withdrawn and the graftotomy closed with 5-0 Prolene.  50 mg of protamine was given.  Once hemostasis was satisfactory both groin incisions were closed with multiple layers of 20 and 3-0 Vicryl.  4-0 Vicryl was used to close the skin in the right.  I major that I put the muscle flap back in place on the left.  I used staples to reapproximate the skin on the left.  Incisional wound vacs were then placed.  There were no immediate  complications.   Disposition:  To PACU in stable condition.   Theotis Burrow, M.D. [facility] Office: [phone] Pager:  [phone]

## 2013-12-18 LAB — POCT ACTIVATED CLOTTING TIME: Activated Clotting Time: 230 seconds

## 2013-12-18 LAB — CBC
HEMATOCRIT: 35.2 % — AB (ref 36.0–46.0)
Hemoglobin: 11.1 g/dL — ABNORMAL LOW (ref 12.0–15.0)
MCH: 30.9 pg (ref 26.0–34.0)
MCHC: 31.5 g/dL (ref 30.0–36.0)
MCV: 98.1 fL (ref 78.0–100.0)
PLATELETS: 368 10*3/uL (ref 150–400)
RBC: 3.59 MIL/uL — AB (ref 3.87–5.11)
RDW: 13.9 % (ref 11.5–15.5)
WBC: 14 10*3/uL — AB (ref 4.0–10.5)

## 2013-12-18 LAB — HEMOGLOBIN A1C
HEMOGLOBIN A1C: 8.2 % — AB (ref <5.7)
MEAN PLASMA GLUCOSE: 189 mg/dL — AB (ref <117)

## 2013-12-18 LAB — POCT I-STAT 4, (NA,K, GLUC, HGB,HCT)
Glucose, Bld: 203 mg/dL — ABNORMAL HIGH (ref 70–99)
HCT: 36 % (ref 36.0–46.0)
HEMOGLOBIN: 12.2 g/dL (ref 12.0–15.0)
POTASSIUM: 5.6 meq/L — AB (ref 3.7–5.3)
SODIUM: 139 meq/L (ref 137–147)

## 2013-12-18 LAB — BASIC METABOLIC PANEL
ANION GAP: 13 (ref 5–15)
BUN: 24 mg/dL — AB (ref 6–23)
CHLORIDE: 105 meq/L (ref 96–112)
CO2: 22 meq/L (ref 19–32)
Calcium: 8.6 mg/dL (ref 8.4–10.5)
Creatinine, Ser: 1.3 mg/dL — ABNORMAL HIGH (ref 0.50–1.10)
GFR calc Af Amer: 53 mL/min — ABNORMAL LOW (ref >90)
GFR calc non Af Amer: 45 mL/min — ABNORMAL LOW (ref >90)
Glucose, Bld: 173 mg/dL — ABNORMAL HIGH (ref 70–99)
Potassium: 4.3 mEq/L (ref 3.7–5.3)
Sodium: 140 mEq/L (ref 137–147)

## 2013-12-18 LAB — GLUCOSE, CAPILLARY
Glucose-Capillary: 222 mg/dL — ABNORMAL HIGH (ref 70–99)
Glucose-Capillary: 210 mg/dL — ABNORMAL HIGH (ref 70–99)
Glucose-Capillary: 294 mg/dL — ABNORMAL HIGH (ref 70–99)
Glucose-Capillary: 236 mg/dL — ABNORMAL HIGH (ref 70–99)
Glucose-Capillary: 249 mg/dL — ABNORMAL HIGH (ref 70–99)

## 2013-12-18 MED ORDER — INSULIN ASPART 100 UNIT/ML ~~LOC~~ SOLN
0.0000 [IU] | Freq: Three times a day (TID) | SUBCUTANEOUS | Status: DC
  Administered 2013-12-18: 5 [IU] via SUBCUTANEOUS
  Administered 2013-12-18: 8 [IU] via SUBCUTANEOUS
  Administered 2013-12-19 – 2013-12-20 (×3): 5 [IU] via SUBCUTANEOUS
  Administered 2013-12-20 (×2): 3 [IU] via SUBCUTANEOUS
  Administered 2013-12-21: 2 [IU] via SUBCUTANEOUS

## 2013-12-18 NOTE — Progress Notes (Signed)
Utilization Review Completed.Donne Anon T11/20/2015

## 2013-12-18 NOTE — Progress Notes (Addendum)
  [facility] Progress Note  12/18/2013 11:19 AM 1 Day Post-Op  Subjective:  Bilateral groin soreness.  Tmax 98.3 BP sys 100s-140s 02 100 3L  Filed Vitals:   12/18/13 0700  BP:   Pulse:   Temp: 98.3 F (36.8 C)  Resp:     Physical Exam: Incisions:  Bilateral groin with wound vac seal intact. Non palpable fem fem graft pulse. Extremities:  2+ right DP pulse, + doppler signals left PT and DP   CBC    Component Value Date/Time   WBC 14.0* 12/18/2013 0247   RBC 3.59* 12/18/2013 0247   HGB 11.1* 12/18/2013 0247   HCT 35.2* 12/18/2013 0247   PLT 368 12/18/2013 0247   MCV 98.1 12/18/2013 0247   MCH 30.9 12/18/2013 0247   MCHC 31.5 12/18/2013 0247   RDW 13.9 12/18/2013 0247   LYMPHSABS 2.1 03/14/2013 0400   MONOABS 1.1* 03/14/2013 0400   EOSABS 0.1 03/14/2013 0400   BASOSABS 0.1 03/14/2013 0400    BMET    Component Value Date/Time   NA 140 12/18/2013 0247   K 4.3 12/18/2013 0247   CL 105 12/18/2013 0247   CO2 22 12/18/2013 0247   GLUCOSE 173* 12/18/2013 0247   BUN 24* 12/18/2013 0247   CREATININE 1.30* 12/18/2013 0247   CALCIUM 8.6 12/18/2013 0247   GFRNONAA 45* 12/18/2013 0247   GFRAA 53* 12/18/2013 0247    INR    Component Value Date/Time   INR 1.04 12/11/2013 0837     Intake/Output Summary (Last 24 hours) at 12/18/13 1119 Last data filed at 12/18/13 0600  Gross per 24 hour  Intake   4920 ml  Output   3180 ml  Net   1740 ml     Assessment:  53 y.o. female is s/p:  #1 right to left femoral-femoral bypass graft #2: Redo left common femoral artery exposure #3: Right lower extremity angiogram #4: Stent, right superficial femoral artery #5: Thrombectomy, left superficial femoral artery #6: Placement of bilateral incisional wound VAC 1 Day Post-Op  Plan: -2+ right DP pulse, excellent doppler signals left foot. -Surgical blood loss anemia: Hgb 11.1, stable, asymptomatic.  -WBC elevated at 14. Afebrile. Likely reactive from  surgery, will monitor.  -Continue wound vac to groins -PT/OT; mobilize, ambulate. -DVT prophylaxis:  Lovenox -Dispo: Transfer to 2W, Dr. Trula Slade to see patient.    Virgina Jock, PA-C [facility] Office: [phone] Pager: [phone] 12/18/2013 11:19 AM     Agree with the above Appropriate post-op soreness in bilateral groins.  Good biphasic signals bilaterally Continue incisional vac. Will likely start Brilinta for anti-platelet therapy Keep in hospital over weekend for wound management  Wells Tamisha Nordstrom

## 2013-12-18 NOTE — Progress Notes (Signed)
Pt TX to 2W-08, VSS, called report. Son present & aware of [place].

## 2013-12-18 NOTE — Evaluation (Signed)
Physical Therapy Evaluation Patient Details Name: Stephanie Peck MRN: [id number] DOB: Sep 15, 1960 Today's Date: 12/18/2013   History of Present Illness  pt presents with R to L Femoral BPG with Stent and wound vacs at Bil Incisions.    Clinical Impression  Pt needs increased time for bed mobility and transfers 2/2 pain, but is able to complete without physical A.  Pt has good family support and all needed DME at home.  Will continue to follow while on acute.      Follow Up Recommendations Home health PT;Supervision - Intermittent    Equipment Recommendations  None recommended by PT    Recommendations for Other Services       Precautions / Restrictions Precautions Precautions: Fall Precaution Comments: Bil Would vacs Restrictions Weight Bearing Restrictions: No      Mobility  Bed Mobility Overal bed mobility: Needs Assistance Bed Mobility: Supine to Sit     Supine to sit: Min guard     General bed mobility comments: pt utilizes bed rail, but able to complete without physical A.  pt needs A for management of wound vac lines.    Transfers Overall transfer level: Needs assistance Equipment used: Rolling walker (2 wheeled) Transfers: Sit to/from Stand Sit to Stand: Min guard         General transfer comment: pt demos good technique and indicates slight increase in pain during transition sitting to/from standing.  Ambulation/Gait Ambulation/Gait assistance: Min guard Ambulation Distance (Feet): 70 Feet Assistive device: Rolling walker (2 wheeled) Gait Pattern/deviations: Step-through pattern;Decreased stride length;Trunk flexed     General Gait Details: pt leans on RW to decrease pressure on incisions when standing with upright posture.    Stairs            Wheelchair Mobility    Modified Rankin (Stroke Patients Only)       Balance Overall balance assessment: Needs assistance Sitting-balance support: Single extremity supported;Feet supported Sitting  balance-Leahy Scale: Poor Sitting balance - Comments: pt utilizes UE support to take pressure off of Bil Incisions.     Standing balance support: Bilateral upper extremity supported;During functional activity Standing balance-Leahy Scale: Poor                               Pertinent Vitals/Pain Pain Assessment: 0-10 Pain Score: 6  Pain Location: Bil Incisions Pain Descriptors / Indicators: Aching;Burning Pain Intervention(s): Monitored during session;Premedicated before session;Repositioned    Home Living Family/patient expects to be discharged to:: Private residence Living Arrangements: Spouse/significant other;Non-relatives/Friends (Nephew and his wife) Available Help at Discharge: Family;Available 24 hours/day Type of Home: House Home Access: Ramped entrance     Home Layout: One level Home Equipment: Walker - 4 wheels;Cane - single point      Prior Function Level of Independence: Independent with assistive device(s)         Comments: Uses cane for short distances and 4WW for long distance.       Hand Dominance        Extremity/Trunk Assessment   Upper Extremity Assessment: Defer to OT evaluation           Lower Extremity Assessment: Generalized weakness;LLE deficits/detail (Weakness 2/2 incisional pain.  )   LLE Deficits / Details: Diminished sensation, however pt indicates it is improved from baseline.    Cervical / Trunk Assessment: Normal  Communication   Communication: No difficulties  Cognition Arousal/Alertness: Awake/alert Behavior During Therapy: WFL for tasks assessed/performed Overall  Cognitive Status: Within Functional Limits for tasks assessed                      General Comments      Exercises        Assessment/Plan    PT Assessment Patient needs continued PT services  PT Diagnosis Difficulty walking;Acute pain   PT Problem List Decreased strength;Decreased activity tolerance;Decreased balance;Decreased  mobility;Decreased knowledge of use of DME;Pain;Impaired sensation  PT Treatment Interventions DME instruction;Gait training;Functional mobility training;Therapeutic activities;Therapeutic exercise;Balance training;Patient/family education   PT Goals (Current goals can be found in the Care Plan section) Acute Rehab PT Goals Patient Stated Goal: Walk without AD.   PT Goal Formulation: With patient Time For Goal Achievement: 12/25/13 Potential to Achieve Goals: Good    Frequency Min 3X/week   Barriers to discharge        Co-evaluation               End of Session Equipment Utilized During Treatment: Gait belt Activity Tolerance: Patient tolerated treatment well Patient left: in chair;with call bell/phone within reach Nurse Communication: Mobility status         Time: 2751-7001 PT Time Calculation (min) (ACUTE ONLY): 31 min   Charges:   PT Evaluation $Initial PT Evaluation Tier I: 1 Procedure PT Treatments $Gait Training: 8-22 mins $Therapeutic Activity: 8-22 mins   PT G CodesCatarina Hartshorn, [place] 12/18/2013, 8:50 AM

## 2013-12-18 NOTE — Evaluation (Signed)
Occupational Therapy Evaluation Patient Details Name: MARCELLA CHARLSON MRN: [id number] DOB: 1960-04-14 Today's Date: 12/18/2013    History of Present Illness pt presents with R to L Femoral BPG with Stent and wound vacs at Bil Incisions.     Clinical Impression   Prior to admission, pt walked household distances with a cane and performed self care at a modified independent level. She assisted her family with IADL.  Pt currently requires assistance for LB bathing and dressing and ADL transfers due to pain limiting mobility.  Pt has AE at home and assistance of her family.  Her home is well equipped and accessible.  No further OT needs.    Follow Up Recommendations  No OT follow up    Equipment Recommendations  None recommended by OT    Recommendations for Other Services       Precautions / Restrictions Precautions Precautions: Fall Precaution Comments: Bil Would vacs Restrictions Weight Bearing Restrictions: No      Mobility Bed Mobility Not assessed, pt up in chair.    Transfers Overall transfer level: Needs assistance Equipment used: Rolling walker (2 wheeled) Transfers: Sit to/from Stand Sit to Stand: Min guard         General transfer comment: pt demos good technique and indicates slight increase in pain during transition sitting to/from standing.    Balance Overall balance assessment: Needs assistance Sitting-balance support: Single extremity supported;Feet supported Sitting balance-Leahy Scale: Poor Sitting balance - Comments: pt utilizes UE support to take pressure off of Bil Incisions.     Standing balance support: Bilateral upper extremity supported;During functional activity Standing balance-Leahy Scale: Poor                              ADL Overall ADL's : Needs assistance/impaired Eating/Feeding: Independent;Sitting   Grooming: Wash/dry hands;Min guard;Standing   Upper Body Bathing: Set up;Sitting   Lower Body Bathing: Minimal  assistance;Sit to/from stand   Upper Body Dressing : Set up;Sitting   Lower Body Dressing: Minimal assistance;Sit to/from stand   Toilet Transfer: Min guard;Ambulation;RW   Toileting- Water quality scientist and Hygiene: Min guard;Sit to/from stand       Functional mobility during ADLs: Min guard;Rolling walker General ADL Comments: Pt can typically perform LB ADL, will rely on AE at home.     Vision                     Perception     Praxis      Pertinent Vitals/Pain Pain Assessment: Faces Pain Score: 6  Faces Pain Scale: Hurts a little bit Pain Location: groin incisions Pain Descriptors / Indicators: Aching;Burning Pain Intervention(s): Monitored during session;Repositioned;Premedicated before session     Hand Dominance Right   Extremity/Trunk Assessment Upper Extremity Assessment Upper Extremity Assessment: LUE deficits/detail LUE Deficits / Details: hx of RVR and arthritis limiting shoulder AROM   Lower Extremity Assessment Lower Extremity Assessment: Defer to PT evaluation LLE Deficits / Details: Diminished sensation, however pt indicates it is improved from baseline.   LLE Sensation: decreased light touch   Cervical / Trunk Assessment Cervical / Trunk Assessment: Normal   Communication Communication Communication: No difficulties   Cognition Arousal/Alertness: Awake/alert Behavior During Therapy: WFL for tasks assessed/performed Overall Cognitive Status: Within Functional Limits for tasks assessed                     General Comments  Exercises       Shoulder Instructions      Home Living Family/patient expects to be discharged to:: Private residence Living Arrangements: Spouse/significant other;Non-relatives/Friends Available Help at Discharge: Family;Available 24 hours/day Type of Home: House Home Access: Ramped entrance     Home Layout: One level     Bathroom Shower/Tub: Occupational psychologist:  Handicapped height     Home Equipment: Environmental consultant - 4 wheels;Cane - single point;Shower seat;Grab bars - tub/shower;Hand held shower head;Adaptive equipment;Hospital bed Adaptive Equipment: Reacher;Sock aid;Long-handled shoe horn;Long-handled sponge        Prior Functioning/Environment Level of Independence: Independent with assistive device(s);Needs assistance    ADL's / Homemaking Assistance Needed: assisted with homemaking and cooking   Comments: Uses cane for short distances and 4WW for long distance.      OT Diagnosis:     OT Problem List:     OT Treatment/Interventions:      OT Goals(Current goals can be found in the care plan section) Acute Rehab OT Goals Patient Stated Goal: Walk without AD.    OT Frequency:     Barriers to D/C:            Co-evaluation              End of Session Equipment Utilized During Treatment: Gait belt;Rolling walker  Activity Tolerance: Patient tolerated treatment well Patient left: in chair;with call bell/phone within reach   Time: 8366-2947 OT Time Calculation (min): 22 min Charges:  OT General Charges $OT Visit: 1 Procedure OT Evaluation $Initial OT Evaluation Tier I: 1 Procedure OT Treatments $Self Care/Home Management : 8-22 mins G-Codes:    Malka So 12/18/2013, 9:56 AM  [phone]

## 2013-12-19 ENCOUNTER — Encounter (HOSPITAL_COMMUNITY): Payer: Self-pay | Admitting: General Practice

## 2013-12-19 LAB — GLUCOSE, CAPILLARY
GLUCOSE-CAPILLARY: 246 mg/dL — AB (ref 70–99)
GLUCOSE-CAPILLARY: 229 mg/dL — AB (ref 70–99)
Glucose-Capillary: 209 mg/dL — ABNORMAL HIGH (ref 70–99)
Glucose-Capillary: 142 mg/dL — ABNORMAL HIGH (ref 70–99)

## 2013-12-19 LAB — CBC
HCT: 34.4 % — ABNORMAL LOW (ref 36.0–46.0)
Hemoglobin: 11.2 g/dL — ABNORMAL LOW (ref 12.0–15.0)
MCH: 32.7 pg (ref 26.0–34.0)
MCHC: 32.6 g/dL (ref 30.0–36.0)
MCV: 100.3 fL — AB (ref 78.0–100.0)
Platelets: 347 10*3/uL (ref 150–400)
RBC: 3.43 MIL/uL — ABNORMAL LOW (ref 3.87–5.11)
RDW: 13.9 % (ref 11.5–15.5)
WBC: 15.6 10*3/uL — ABNORMAL HIGH (ref 4.0–10.5)

## 2013-12-19 LAB — BASIC METABOLIC PANEL
Anion gap: 16 — ABNORMAL HIGH (ref 5–15)
BUN: 22 mg/dL (ref 6–23)
CO2: 20 meq/L (ref 19–32)
CREATININE: 1.35 mg/dL — AB (ref 0.50–1.10)
Calcium: 8.5 mg/dL (ref 8.4–10.5)
Chloride: 100 mEq/L (ref 96–112)
GFR calc non Af Amer: 43 mL/min — ABNORMAL LOW (ref >90)
GFR, EST AFRICAN AMERICAN: 50 mL/min — AB (ref >90)
GLUCOSE: 229 mg/dL — AB (ref 70–99)
Potassium: 4.3 mEq/L (ref 3.7–5.3)
Sodium: 136 mEq/L — ABNORMAL LOW (ref 137–147)

## 2013-12-19 NOTE — Progress Notes (Signed)
   VASCULAR SURGERY ASSESSMENT & PLAN:  * 2 Days Post-Op s/p: Right to left femoral-femoral bypass and PTA and stenting of right superficial femoral artery  *  Continue incisional wound VAC's  * ambulate.  * she tells me that her Plavix was previously discontinued because of problems with "scar tissue in her throat." She states that these issues resolved after she stopped her Plavix. For this reason, I have not restarted her Plavix and we'll leave this decision to Dr. Trula Slade.  SUBJECTIVE: No complaints this morning.  PHYSICAL EXAM: Filed Vitals:   12/18/13 1207 12/18/13 1613 12/18/13 1908 12/19/13 0505  BP:  124/45 138/70 127/52  Pulse: 98 96 101 101  Temp: 97.8 F (36.6 C) 98.5 F (36.9 C) 98.8 F (37.1 C) 98.2 F (36.8 C)  TempSrc: Oral  Oral Oral  Resp: 19 15 18 17   Height:      Weight:      SpO2: 92% 99% 96% 94%   Incision VAC's both have a good seal. Both feet are warm and well perfused.  LABS: Lab Results  Component Value Date   WBC 15.6* 12/19/2013   HGB 11.2* 12/19/2013   HCT 34.4* 12/19/2013   MCV 100.3* 12/19/2013   PLT 347 12/19/2013   Lab Results  Component Value Date   CREATININE 1.35* 12/19/2013   Lab Results  Component Value Date   INR 1.04 12/11/2013   CBG (last 3)   Recent Labs  12/18/13 1614 12/18/13 2143 12/19/13 0619  GLUCAP 236* 249* 209*    Active Problems:   PAD (peripheral artery disease)   Gae Gallop Beeper: [phone] 12/19/2013

## 2013-12-19 NOTE — Plan of Care (Signed)
Problem: Acute Rehab PT Goals(only PT should resolve) Goal: Patient Will Transfer Sit To/From Stand Outcome: Progressing Goal: Pt Will Ambulate Outcome: Progressing

## 2013-12-19 NOTE — Progress Notes (Signed)
Physical Therapy Treatment Patient Details Name: Stephanie Peck MRN: [id number] DOB: [birth year] Today's Date: 12/19/2013    History of Present Illness pt presents with R to L Femoral BPG with Stent and wound vacs at Bil Incisions.      PT Comments    Patient making good gains with mobility and gait.  Pain remains issue.  Follow Up Recommendations  Home health PT;Supervision - Intermittent     Equipment Recommendations  None recommended by PT    Recommendations for Other Services       Precautions / Restrictions Precautions Precautions: Fall Precaution Comments: Bil Would vacs Restrictions Weight Bearing Restrictions: No    Mobility  Bed Mobility                  Transfers Overall transfer level: Modified independent Equipment used: None Transfers: Sit to/from Stand Sit to Stand: Modified independent (Device/Increase time)         General transfer comment: No physical assist required.  Patient using good hand placement.  Good balance in unsupported static standing.  Ambulation/Gait Ambulation/Gait assistance: Supervision Ambulation Distance (Feet): 152 Feet Assistive device: Rolling walker (2 wheeled) Gait Pattern/deviations: Step-through pattern;Decreased stride length Gait velocity: Decreased Gait velocity interpretation: Below normal speed for age/gender General Gait Details: Patient with safe use of RW, with minimal weight on RW for support.  Slow, antalgic gait pattern.  Good balance with gait with RW.  Fatigues quickly.   Stairs            Wheelchair Mobility    Modified Rankin (Stroke Patients Only)       Balance                                    Cognition Arousal/Alertness: Awake/alert Behavior During Therapy: WFL for tasks assessed/performed Overall Cognitive Status: Within Functional Limits for tasks assessed                      Exercises General Exercises - Upper Extremity Shoulder Flexion:  AROM;Both;10 reps;Seated;Limitations Shoulder Flexion Limitations: Decreased active ROM Lt shoulder due to prior injury General Exercises - Lower Extremity Long Arc Quad: AROM;Both;10 reps;Seated Hip ABduction/ADduction: AROM;Both;10 reps;Seated Hip Flexion/Marching: AROM;Both;5 reps;Seated Toe Raises: AROM;Both;15 reps;Seated Heel Raises: AROM;Both;15 reps;Seated    General Comments        Pertinent Vitals/Pain Pain Assessment: 0-10 Pain Score: 7  Pain Location: Bil groin areas (incision sites) Pain Descriptors / Indicators: Sore Pain Intervention(s): Patient requesting pain meds-RN notified;Repositioned    Home Living                      Prior Function            PT Goals (current goals can now be found in the care plan section) Progress towards PT goals: Progressing toward goals    Frequency  Min 3X/week    PT Plan Current plan remains appropriate    Co-evaluation             End of Session Equipment Utilized During Treatment: Gait belt Activity Tolerance: Patient tolerated treatment well Patient left: in chair;with call bell/phone within reach     Time: 0937-1001 PT Time Calculation (min) (ACUTE ONLY): 24 min  Charges:  $Gait Training: 8-22 mins $Therapeutic Exercise: 8-22 mins  G Codes:      Despina Pole 12/19/2013, 10:13 AM Carita Pian. Sanjuana Kava, [place] Pager [phone]

## 2013-12-19 NOTE — Progress Notes (Signed)
Patient alert and oriented x4 throughout shift, vital signs stable.  Patient up to bathroom with one assist.  Patient medicated with PRNs for pain during shift to good effect.  Patient denies any questions or concerns at this time.  Will continue to monitor.

## 2013-12-20 LAB — CBC
HCT: 34.6 % — ABNORMAL LOW (ref 36.0–46.0)
Hemoglobin: 10.9 g/dL — ABNORMAL LOW (ref 12.0–15.0)
MCH: 31 pg (ref 26.0–34.0)
MCHC: 31.5 g/dL (ref 30.0–36.0)
MCV: 98.3 fL (ref 78.0–100.0)
Platelets: 363 10*3/uL (ref 150–400)
RBC: 3.52 MIL/uL — ABNORMAL LOW (ref 3.87–5.11)
RDW: 14 % (ref 11.5–15.5)
WBC: 13.1 10*3/uL — ABNORMAL HIGH (ref 4.0–10.5)

## 2013-12-20 LAB — GLUCOSE, CAPILLARY
GLUCOSE-CAPILLARY: 198 mg/dL — AB (ref 70–99)
GLUCOSE-CAPILLARY: 173 mg/dL — AB (ref 70–99)
Glucose-Capillary: 205 mg/dL — ABNORMAL HIGH (ref 70–99)
Glucose-Capillary: 189 mg/dL — ABNORMAL HIGH (ref 70–99)

## 2013-12-20 LAB — BASIC METABOLIC PANEL
Anion gap: 16 — ABNORMAL HIGH (ref 5–15)
BUN: 23 mg/dL (ref 6–23)
CALCIUM: 9.3 mg/dL (ref 8.4–10.5)
CO2: 23 mEq/L (ref 19–32)
Chloride: 104 mEq/L (ref 96–112)
Creatinine, Ser: 1.45 mg/dL — ABNORMAL HIGH (ref 0.50–1.10)
GFR, EST AFRICAN AMERICAN: 46 mL/min — AB (ref >90)
GFR, EST NON AFRICAN AMERICAN: 40 mL/min — AB (ref >90)
Glucose, Bld: 171 mg/dL — ABNORMAL HIGH (ref 70–99)
POTASSIUM: 4.8 meq/L (ref 3.7–5.3)
SODIUM: 143 meq/L (ref 137–147)

## 2013-12-20 MED ORDER — INSULIN ASPART 100 UNIT/ML ~~LOC~~ SOLN
14.0000 [IU] | Freq: Every day | SUBCUTANEOUS | Status: DC
  Administered 2013-12-20: 14 [IU] via SUBCUTANEOUS

## 2013-12-20 NOTE — Progress Notes (Addendum)
  [facility] Progress Note  12/20/2013 7:28 AM 3 Days Post-Op  Subjective:  Noticed "boil" in right groin yesterday with purulent drainage. Says she gets boils frequently. Otherwise doing well.   Tmax 98 BP sys 120s-130s 02 98% RA  Filed Vitals:   12/20/13 0416  BP: 133/62  Pulse: 91  Temp: 97.9 F (36.6 C)  Resp: 18    Physical Exam: Incisions:  Bilateral groin with wound vac seal intact. Right groin boil below vac dressing with some sanguinous drainage. No purulent drainage. No fluctuance. Mild induration.  Extremities:  Feet are warm and pink.  CBC    Component Value Date/Time   WBC 13.1* 12/20/2013 0342   RBC 3.52* 12/20/2013 0342   HGB 10.9* 12/20/2013 0342   HCT 34.6* 12/20/2013 0342   PLT 363 12/20/2013 0342   MCV 98.3 12/20/2013 0342   MCH 31.0 12/20/2013 0342   MCHC 31.5 12/20/2013 0342   RDW 14.0 12/20/2013 0342   LYMPHSABS 2.1 03/14/2013 0400   MONOABS 1.1* 03/14/2013 0400   EOSABS 0.1 03/14/2013 0400   BASOSABS 0.1 03/14/2013 0400   BMET    Component Value Date/Time   NA 143 12/20/2013 0342   K 4.8 12/20/2013 0342   CL 104 12/20/2013 0342   CO2 23 12/20/2013 0342   GLUCOSE 171* 12/20/2013 0342   BUN 23 12/20/2013 0342   CREATININE 1.45* 12/20/2013 0342   CALCIUM 9.3 12/20/2013 0342   GFRNONAA 40* 12/20/2013 0342   GFRAA 46* 12/20/2013 0342   INR    Component Value Date/Time   INR 1.04 12/11/2013 0837    Intake/Output Summary (Last 24 hours) at 12/20/13 0728 Last data filed at 12/20/13 0300  Gross per 24 hour  Intake    720 ml  Output      0 ml  Net    720 ml   Assessment:  53 y.o. female is s/p: Right to left femoral-femoral bypass and PTA and stenting of right superficial femoral artery  3 Days Post-Op  Plan: -Groin wound vacs seal intact. Feet are well perfused. -Continue incisional wound vacs -Right groin boil with sanguinous drainage. May need I &D. Will discuss with Dr. Scot Dock. -Creatinine: increased to  1.45 from 1.35 yesterday. Gentle hydration NS 198ml/hr.  -Ambulate. -DM: restarted evening novolog.  -DVT prophylaxis:  Lovenox -Dispo: Anticipate discharge next few days.  Virgina Jock, PA-C [facility] Office: [phone] Pager: [phone] 12/20/2013 7:28 AM  Agree with above. VAC's have a good seal. We'll probably discontinue VAC's tomorrow. Possible discharge tomorrow.  Deitra Mayo, MD, [place] [phone] 12/20/2013

## 2013-12-21 ENCOUNTER — Telehealth: Payer: Self-pay | Admitting: Surgery

## 2013-12-21 DIAGNOSIS — Z9889 Other specified postprocedural states: Secondary | ICD-10-CM

## 2013-12-21 LAB — BASIC METABOLIC PANEL
Anion gap: 12 (ref 5–15)
BUN: 19 mg/dL (ref 6–23)
CO2: 22 meq/L (ref 19–32)
Calcium: 8.8 mg/dL (ref 8.4–10.5)
Chloride: 105 mEq/L (ref 96–112)
Creatinine, Ser: 1.25 mg/dL — ABNORMAL HIGH (ref 0.50–1.10)
GFR calc Af Amer: 55 mL/min — ABNORMAL LOW (ref >90)
GFR, EST NON AFRICAN AMERICAN: 48 mL/min — AB (ref >90)
GLUCOSE: 131 mg/dL — AB (ref 70–99)
Potassium: 4.9 mEq/L (ref 3.7–5.3)
SODIUM: 139 meq/L (ref 137–147)

## 2013-12-21 LAB — GLUCOSE, CAPILLARY
Glucose-Capillary: 134 mg/dL — ABNORMAL HIGH (ref 70–99)
Glucose-Capillary: 189 mg/dL — ABNORMAL HIGH (ref 70–99)

## 2013-12-21 LAB — CBC
HEMATOCRIT: 31.9 % — AB (ref 36.0–46.0)
HEMOGLOBIN: 10.2 g/dL — AB (ref 12.0–15.0)
MCH: 31.4 pg (ref 26.0–34.0)
MCHC: 32 g/dL (ref 30.0–36.0)
MCV: 98.2 fL (ref 78.0–100.0)
Platelets: 370 10*3/uL (ref 150–400)
RBC: 3.25 MIL/uL — AB (ref 3.87–5.11)
RDW: 13.8 % (ref 11.5–15.5)
WBC: 10.8 10*3/uL — ABNORMAL HIGH (ref 4.0–10.5)

## 2013-12-21 MED ORDER — TICAGRELOR 90 MG PO TABS: 90.0000 mg | ORAL_TABLET | Freq: Two times a day (BID) | ORAL | Status: DC

## 2013-12-21 MED ORDER — OXYCODONE-ACETAMINOPHEN 5-325 MG PO TABS: 1.0000 | ORAL_TABLET | ORAL | Status: DC | PRN

## 2013-12-21 NOTE — Plan of Care (Signed)
Problem: Acute Rehab PT Goals(only PT should resolve) Goal: Patient Will Transfer Sit To/From Stand Outcome: Completed/Met Date Met:  12/21/13

## 2013-12-21 NOTE — Progress Notes (Signed)
Utilization review completed.  

## 2013-12-21 NOTE — Progress Notes (Signed)
Medicare Important Message given? YES  (If response is "NO", the following Medicare IM given date fields will be blank)  Date Medicare IM given: 12/21/13 Medicare IM given by:  Zekiah Caruth  

## 2013-12-21 NOTE — Plan of Care (Signed)
Problem: Consults Goal: Vascular Cath Int Patient Education (See Patient Education module for education specifics.) Outcome: Completed/Met Date Met:  12/21/13 Goal: Skin Care Protocol Initiated - if Braden Score 18 or less If consults are not indicated, leave blank or document N/A Outcome: Completed/Met Date Met:  12/21/13 Goal: Tobacco Cessation referral if indicated Outcome: Completed/Met Date Met:  12/21/13 Goal: Nutrition Consult-if indicated Outcome: Completed/Met Date Met:  12/21/13 Goal: Diabetes Guidelines if Diabetic/Glucose > 140 If diabetic or lab glucose is > 140 mg/dl - Initiate Diabetes/Hyperglycemia Guidelines & Document Interventions  Outcome: Completed/Met Date Met:  12/21/13  Problem: Phase I Progression Outcomes Goal: Distal pulses equal to baseline Outcome: Completed/Met Date Met:  12/21/13 Goal: Pain controlled with appropriate interventions Outcome: Completed/Met Date Met:  12/21/13 Goal: Initial discharge plan identified Outcome: Completed/Met Date Met:  12/21/13 Goal: Voiding-avoid urinary catheter unless indicated Outcome: Completed/Met Date Met:  12/21/13 Goal: Hemodynamically stable Outcome: Completed/Met Date Met:  12/21/13  Problem: Problem: Skin/Wound Progression Goal: HEALING SURGICAL WOUND Outcome: Progressing Goal: VAC APPROVAL Outcome: Completed/Met Date Met:  12/21/13

## 2013-12-21 NOTE — Progress Notes (Signed)
PT Cancellation Note  Patient Details Name: Stephanie Peck MRN: [id number] DOB: [birth year]   Cancelled Treatment:    Reason Eval/Treat Not Completed: Patient at procedure or test/unavailable  Will follow up at a later time when pt returns to unit.    [place], [place]  [name] 12/21/2013, 10:32 AM

## 2013-12-21 NOTE — Progress Notes (Signed)
VASCULAR LAB PRELIMINARY  ARTERIAL  ABI completed:    RIGHT    LEFT    PRESSURE WAVEFORM  PRESSURE WAVEFORM  BRACHIAL 162 Tri BRACHIAL 144 Tri  DP   DP    AT 101 [place] AT 104 [address]  PT 113 [place] PT 110 [place]  PER   PER    GREAT TOE  NA GREAT TOE  NA    RIGHT LEFT  ABI 0.7 0.68     Landry Mellow, RDMS, RVT  12/21/2013, 10:30 AM

## 2013-12-21 NOTE — Progress Notes (Addendum)
  [facility] Progress Note  12/21/2013 7:42 AM 4 Days Post-Op  Subjective:  Doing well today. No complaints. Ready to go home.   Tmax 98.9 BP sys 110s 02 96% RA   Filed Vitals:   12/21/13 0512  BP: 118/77  Pulse: 91  Temp: 98.9 F (37.2 C)  Resp: 18    Physical Exam: Incisions:  Bilateral wound vac seal in place. No drainage from prior boil in right groin. No fluctuance. No induration.  Extremities:  Feet are warm and well-perfused.   CBC    Component Value Date/Time   WBC 10.8* 12/21/2013 0443   RBC 3.25* 12/21/2013 0443   HGB 10.2* 12/21/2013 0443   HCT 31.9* 12/21/2013 0443   PLT 370 12/21/2013 0443   MCV 98.2 12/21/2013 0443   MCH 31.4 12/21/2013 0443   MCHC 32.0 12/21/2013 0443   RDW 13.8 12/21/2013 0443   LYMPHSABS 2.1 03/14/2013 0400   MONOABS 1.1* 03/14/2013 0400   EOSABS 0.1 03/14/2013 0400   BASOSABS 0.1 03/14/2013 0400    BMET    Component Value Date/Time   NA 139 12/21/2013 0443   K 4.9 12/21/2013 0443   CL 105 12/21/2013 0443   CO2 22 12/21/2013 0443   GLUCOSE 131* 12/21/2013 0443   BUN 19 12/21/2013 0443   CREATININE 1.25* 12/21/2013 0443   CALCIUM 8.8 12/21/2013 0443   GFRNONAA 48* 12/21/2013 0443   GFRAA 55* 12/21/2013 0443    INR    Component Value Date/Time   INR 1.04 12/11/2013 0837     Intake/Output Summary (Last 24 hours) at 12/21/13 0742 Last data filed at 12/20/13 2300  Gross per 24 hour  Intake   1500 ml  Output      0 ml  Net   1500 ml     Assessment:  53 y.o. female is s/p: right to left femoral-femoral bypass and PTA and stenting of right superficial femoral artery.    4 Days Post-Op  Plan: -Wound vac seal intact. Will discontinue wound vac today.  -Creatinine improved to 1.25 today.  -DVT prophylaxis:  Lovenox -Patient with adverse reaction to plavix. Says it worsened her "reflux" and caused issues with "scar tissue in her throat. Was discontinued by her PCP. Denies any bleeding problems  on plavix. Per Dr. Trula Slade, will send home on Brilinta.  -Discharge home today.   Virgina Jock, PA-C [facility] Office: [phone] Pager: [phone] 12/21/2013 7:42 AM     I agree with the above.  Incisional vcac to be changed to day.  If wounds look ok, will d/c home plavix allergy, so will start [place]

## 2013-12-21 NOTE — Care Management Note (Signed)
    Page 1 of 2   12/21/2013     2:19:00 PM CARE MANAGEMENT NOTE 12/21/2013  Patient:  Stephanie Peck, Stephanie Peck   Account Number:  [id number]  Date Initiated:  12/21/2013  Documentation initiated by:  Marvetta Gibbons  Subjective/Objective Assessment:   Pt admitted with PVD     Action/Plan:   PTA pt lived at home- PT eval recommendation for [facility]   Anticipated DC Date:  12/21/2013   Anticipated DC Plan:  [place]  CM consult  Medication Assistance      [facility] Choice  HOME HEALTH   Choice offered to / List presented to:  C-1 Patient        [place] arranged  HH-1 RN  [place].   Status of service:  Completed, signed off Medicare Important Message given?  YES (If response is "NO", the following Medicare IM given date fields will be blank) Date Medicare IM given:  12/21/2013 Medicare IM given by:  Marvetta Gibbons Date Additional Medicare IM given:   Additional Medicare IM given by:    Discharge Disposition:  [place]  Per UR Regulation:  Reviewed for med. necessity/level of care/duration of stay  If discussed at [place] of Stay Meetings, dates discussed:    Comments:  12/21/13- [place] RN, BSN [phone] Pt for d/c home today, orders for [facility]- per pt choice she would like to use [facility] for [facility] services- referral called to Amy with [facility] for HH-RN/PT- pt also started on Brilinta- free 30 day card given to pt along with savings card- per benefits check- per rep at [employer]: $126.96 at retail/ no auth required patient can use any retail pharmacy Information shared with pt- -- no further d/c needs noted.

## 2013-12-21 NOTE — Progress Notes (Signed)
Physical Therapy Treatment Patient Details Name: Stephanie Peck MRN: [id number] DOB: February 18, 1960 Today's Date: 12/21/2013    History of Present Illness pt presents with R to L Femoral BPG with Stent and wound vacs at Bil Incisions.      PT Comments    [name], ambulating up to 375 feet with supervision for safety and the use of a rolling walker. Tolerating gait training and therapeutic exercises well. [name]   Follow Up Recommendations  Home health PT;Supervision - Intermittent     Equipment Recommendations  None recommended by PT    Recommendations for Other Services       Precautions / Restrictions Precautions Precautions: Fall Precaution Comments: Bil Would vacs Restrictions Weight Bearing Restrictions: No    Mobility  Bed Mobility                  Transfers Overall transfer level: Modified independent Equipment used: None Transfers: Sit to/from Stand Sit to Stand: Modified independent (Device/Increase time)         General transfer comment: No physical assist required.  Patient using good hand placement.  Good balance in unsupported static standing. Impulsive to stand when PT entered room  Ambulation/Gait Ambulation/Gait assistance: Supervision Ambulation Distance (Feet): 375 Feet Assistive device: Rolling walker (2 wheeled) Gait Pattern/deviations: Step-through pattern;Decreased stride length;Trunk flexed Gait velocity: Decreased   General Gait Details: Intermittent cues for upright posture with forward gaze. Demonstrates good control with rolling walker. Did not require a standing rest break to complete this bout today.   Stairs            Wheelchair Mobility    Modified Rankin (Stroke Patients Only)       Balance                                    Cognition Arousal/Alertness: Awake/alert Behavior  During Therapy: WFL for tasks assessed/performed Overall Cognitive Status: Within Functional Limits for tasks assessed                      Exercises General Exercises - Lower Extremity Ankle Circles/Pumps: Both;10 reps;Seated;AROM Long Arc Quad: AROM;Both;10 reps;Seated Hip Flexion/Marching: AROM;Both;Seated;10 reps    General Comments        Pertinent Vitals/Pain Pain Assessment: 0-10 Pain Score: 6  Pain Location: BIL incisions Pain Descriptors / Indicators: [place]ing Pain Intervention(s): Monitored during session;Repositioned    Home Living                      Prior Function            PT Goals (current goals can now be found in the care plan section) Acute Rehab PT Goals PT Goal Formulation: With patient Time For Goal Achievement: 12/25/13 Potential to Achieve Goals: Good Progress towards PT goals: Progressing toward goals    Frequency  Min 3X/week    PT Plan Current plan remains appropriate    Co-evaluation             End of Session Equipment Utilized During Treatment: Gait belt Activity Tolerance: Patient tolerated treatment well Patient left: with call bell/phone within reach;in bed;with family/visitor present (EOB)     Time: 7124-5809 PT Time Calculation (min) (ACUTE ONLY): 14 min  Charges:  $Gait Training: 8-22 mins  G CodesEllouise Newer 2013/12/31, 11:53 AM  Elayne Snare, [place]

## 2013-12-21 NOTE — Telephone Encounter (Addendum)
-----   Message from Mena Goes, RN sent at 12/21/2013 10:02 AM EST ----- Regarding: Schedule   ----- Message -----    From: Alvia Grove, PA-C    Sent: 12/21/2013   8:10 AM      To: Vvs Charge Pool  S/p right to left femoral-femoral bypass and PTA and stenting of right superficial femoral artery 12/17/13  F/u with Dr. Trula Slade in 2 weeks.   Thanks Kim    12/21/13: lm for pt re appt, dpm

## 2013-12-21 NOTE — Discharge Summary (Signed)
[facility] Discharge Summary  Stephanie Peck [birth year] 53 y.o. female  [id number]  Admission Date: 12/17/2013  Discharge Date: 12/21/2013  Physician: Serafina Mitchell, MD  Admission Diagnosis: Peripheral Vascular Disease with rest pain left lower extremity Claudication right lower extremity   HPI:   This is a 53 y.o. female who on 04/07/2012  underwent redo left femoral endarterectomy with patch angioplasty. Dr. Trula Slade previously performed a left femoral endarterectomy and this has occluded. She came in with numbness to her left foot. Dr. Migdalia Dk provided a muscle flap for wound coverage. The patient's numbness resolved after her operation. She recently began complaining of numbness in her left foot. She underwent angiography which reveals an occluded left external iliac artery. She reports that she cannot walk very far before she gets pain in her feet. Her foot remains numb. She is intolerant of Plavix. Earlier this year she suffered a bout of sepsis where she had acute renal failure. This has resolved.   Hospital Course:  The patient was admitted to the hospital and taken to the operating room on 12/17/2013 and underwent: right to left femoral-femoral bypass graft and PTA and stenting of right superficial femoral artery.    The patient tolerated the procedure well and was transported to the PACU in  stable condition.   She had bilateral incisional wound vacs placed. On POD 1, she was doing well with good biphasic doppler signals bilaterally. She was transferred to the floor.   She continued to do well on PODs 2-4. Her creatinine peaked at 1.45, but decreased back down to 1.25 on POD 4 after gentle hydration. Her incisional wound vacs were discontinued on POD 4. The incisions were clean and intact. She was started on Brilinta due to her Plavix intolerance. She was progressing well with physical therapy. She was recommended to have home health PT. She was  discharged home on POD 4 in good condition.  CBC    Component Value Date/Time   WBC 10.8* 12/21/2013 0443   RBC 3.25* 12/21/2013 0443   HGB 10.2* 12/21/2013 0443   HCT 31.9* 12/21/2013 0443   PLT 370 12/21/2013 0443   MCV 98.2 12/21/2013 0443   MCH 31.4 12/21/2013 0443   MCHC 32.0 12/21/2013 0443   RDW 13.8 12/21/2013 0443   LYMPHSABS 2.1 03/14/2013 0400   MONOABS 1.1* 03/14/2013 0400   EOSABS 0.1 03/14/2013 0400   BASOSABS 0.1 03/14/2013 0400    BMET    Component Value Date/Time   NA 139 12/21/2013 0443   K 4.9 12/21/2013 0443   CL 105 12/21/2013 0443   CO2 22 12/21/2013 0443   GLUCOSE 131* 12/21/2013 0443   BUN 19 12/21/2013 0443   CREATININE 1.25* 12/21/2013 0443   CALCIUM 8.8 12/21/2013 0443   GFRNONAA 48* 12/21/2013 0443   GFRAA 55* 12/21/2013 0443     Discharge Instructions:   The patient is discharged to home with extensive instructions on wound care and progressive ambulation.  They are instructed not to drive or perform any heavy lifting until returning to see the physician in his office.  Discharge Instructions    Call MD for:  redness, tenderness, or signs of infection (pain, swelling, bleeding, redness, odor or green/yellow discharge around incision site)    Complete by:  As directed      Call MD for:  severe or increased pain, loss or decreased feeling  in affected limb(s)    Complete by:  As directed  Call MD for:  temperature >100.5    Complete by:  As directed      Driving Restrictions    Complete by:  As directed   No driving for 1 week and while on pain medication     Increase activity slowly    Complete by:  As directed   Walk with assistance use walker or cane as needed     Lifting restrictions    Complete by:  As directed   No lifting for 2 weeks     Resume previous diet    Complete by:  As directed      may wash over wound with mild soap and water    Complete by:  As directed   May shower. Wash the groin wound with soap and water  daily and pat dry. (No tub bath- only shower). Use hair dryer to groins.  Then put a dry gauze or washcloth there to keep this area dry daily and as needed.  Do not use Vaseline or neosporin on your incisions.  Only use soap and water on your incisions and then protect and keep dry.           Discharge Diagnosis:  Peripheral Vascular Disease with rest pain left lower extremity Claudication right lower extremity  Secondary Diagnosis: Patient Active Problem List   Diagnosis Date Noted  . PAD (peripheral artery disease) 12/17/2013  . Numbness of lower limb 11/18/2013  . PVD (peripheral vascular disease) 06/18/2013  . Palliative care status 03/15/2013  . Acute renal failure 03/08/2013  . Sepsis 03/05/2013  . CHF, acute on chronic 03/05/2013  . DM type 2 (diabetes mellitus, type 2) 03/05/2013  . Gluteal abscess 03/05/2013  . Aneurysm of unspecified site 08/25/2012  . 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 Encounter for postoperative wound check 04/28/2012  . Pain in limb 03/31/2012  . Peripheral vascular disease, unspecified 02/29/2012  . Pain in the groin 02/29/2012  . Atherosclerosis of native arteries of the extremities with intermittent claudication 11/19/2011  . Hip pain, bilateral 10/10/2011  . Leg pain, bilateral 06/27/2011  . Low back pain 06/27/2011  . Bilateral knee pain 06/27/2011  . DIABETES MELLITUS, TYPE II 03/13/2007  . OBESITY 03/13/2007  . HYPERTENSION 03/13/2007  . UNSTABLE ANGINA 03/13/2007  . CONGESTIVE HEART FAILURE 03/13/2007  . DIASTOLIC DYSFUNCTION 81/44/8185  . GERD 03/13/2007  . SLEEP APNEA, MILD 03/13/2007  . HEADACHE, CHRONIC 03/13/2007   Past Medical History  Diagnosis Date  . GERD (gastroesophageal reflux disease)   . Anxiety   . CHF (congestive heart failure)   . COPD (chronic obstructive pulmonary disease)   . Depression   . Morbid obesity   . Hypertension   . Asthma   . On home oxygen therapy     "2L q hs" (12/19/2013)  . Chronic  bronchitis     "get it pretty much q yr"  . OSA on CPAP     severe OSA by 09/08/06 sleep study ([facility])  . Type II diabetes mellitus        Medication List    TAKE these medications        albuterol 108 (90 BASE) MCG/ACT inhaler  Commonly known as:  PROVENTIL HFA;VENTOLIN HFA  Inhale 1 puff into the lungs every 6 (six) hours as needed for wheezing or shortness of breath.     aspirin 325 MG EC tablet  Take 1 tablet (325 mg total) by mouth daily.     atorvastatin 20 MG  tablet  Commonly known as:  LIPITOR  Take 1 tablet (20 mg total) by mouth daily.     BD PEN NEEDLE NANO U/F 32G X 4 MM Misc  Generic drug:  Insulin Pen Needle     buPROPion 300 MG 24 hr tablet  Commonly known as:  WELLBUTRIN XL  Take 300 mg by mouth daily.     clindamycin 1 % gel  Commonly known as:  CLINDAGEL  Apply 1 application topically 2 (two) times daily.     [employer] STOOL SOFTENER 100 MG capsule  Generic drug:  docusate sodium  Take 100 mg by mouth daily.     cyclobenzaprine 10 MG tablet  Commonly known as:  FLEXERIL  Take 10 mg by mouth 2 (two) times daily as needed for muscle spasms.     DULoxetine 60 MG capsule  Commonly known as:  CYMBALTA  Take 60 mg by mouth daily.     insulin aspart 100 UNIT/ML injection  Commonly known as:  novoLOG  Inject 14 Units into the skin at bedtime.     insulin detemir 100 UNIT/ML injection  Commonly known as:  LEVEMIR  Inject 64 Units into the skin daily.     JANUVIA 50 MG tablet  Generic drug:  sitaGLIPtin  Take 50 mg by mouth daily.     lisinopril 2.5 MG tablet  Commonly known as:  PRINIVIL,ZESTRIL  Take 2.5 mg by mouth daily.     losartan 50 MG tablet  Commonly known as:  COZAAR  Take 50 mg by mouth daily.     multivitamin with minerals tablet  Take 1 tablet by mouth daily.     omeprazole 40 MG capsule  Commonly known as:  PRILOSEC  Take 40 mg by mouth 2 (two) times daily.     OVER THE COUNTER MEDICATION  Take 630 mg by mouth daily. OTC  Calcium     oxyCODONE-acetaminophen 5-325 MG per tablet  Commonly known as:  PERCOCET/ROXICET  Take 1 tablet by mouth every 6 (six) hours as needed (pain).     oxyCODONE-acetaminophen 5-325 MG per tablet  Commonly known as:  PERCOCET/ROXICET  Take 1-2 tablets by mouth every 4 (four) hours as needed for moderate pain.     promethazine 25 MG tablet  Commonly known as:  PHENERGAN  Take 25 mg by mouth every 6 (six) hours as needed for nausea or vomiting.     SLOW FE PO  Take 1 tablet by mouth daily.     ticagrelor 90 MG Tabs tablet  Commonly known as:  BRILINTA  Take 1 tablet (90 mg total) by mouth 2 (two) times daily.     topiramate 50 MG tablet  Commonly known as:  TOPAMAX  Take 1 tablet (50 mg total) by mouth daily.     vitamin B-12 500 MCG tablet  Commonly known as:  CYANOCOBALAMIN  Take 500 mcg by mouth daily.        Percocet #30 No Refill  Disposition: Home  Patient's condition: is Good  Follow up: 1. Dr. Trula Slade in 2 weeks   Virgina Jock, PA-C [facility] [phone] 12/21/2013  11:29 AM  - For VQI Registry use --- Instructions: Press F2 to tab through selections.  Delete question if not applicable.   Post-op:  Wound infection: No  Graft infection: No  Transfusion: No  New Arrhythmia: No Ipsilateral amputation: No, [ ]  Minor, [ ]  BKA, [ ]  AKA Discharge patency: [x ] Primary, []  Primary assisted, [ ]   Secondary, [ ]  Occluded Patency judged by: [x]  Dopper only, [ ]  Palpable graft pulse, [ ]  Palpable distal pulse, [ ]  ABI inc. > 0.15, [ ]  Duplex Discharge ABI: R 0.7, L 0.68 D/C Ambulatory Status: Ambulatory with Assistance  Complications: MI: No, [ ]  Troponin only, [ ]  EKG or Clinical CHF: No Resp failure:No, [ ]  Pneumonia, [ ]  Ventilator Chg in renal function: No, [ ]  Inc. Cr > 0.5, [ ]  Temp. Dialysis, [ ]  Permanent dialysis Stroke: No, [ ]  Minor, [ ]  Major Return to OR: No  Reason for return to OR: [ ]  Bleeding, [ ]  Infection,  [ ]  Thrombosis, [ ]  Revision  Discharge medications: Statin use:  yes ASA use:  yes Plavix use:  no, on Brilinta Beta blocker use: no, on ACEI Coumadin use: no

## 2013-12-23 ENCOUNTER — Encounter (HOSPITAL_COMMUNITY): Payer: Self-pay | Admitting: Surgery

## 2013-12-28 ENCOUNTER — Telehealth: Payer: Self-pay

## 2013-12-28 NOTE — Telephone Encounter (Signed)
Pt. called to report increased drainage from right groin incision. Reported the drainage is "pouring-out at times from a grape-sized knot" at top of the right groin incision.  Stated she has had to change the dressing every 1-2 hrs.  Denies fever or chills.  Reported to Dr. Trula Slade.  Recommended to schedule appt. For pt. In AM with office MD, and "be prepared to place negative pressure wound therapy."  Called pt; spoke with husband; appt. given for 9:00 AM 12/1 with Dr. Kellie Simmering.  Notified [facility] [place], via voice mail, of need to schedule pt. for initiating Negative Pressure Wound Therapy tomorrow.

## 2013-12-29 ENCOUNTER — Other Ambulatory Visit: Payer: Self-pay | Admitting: Vascular Surgery

## 2013-12-29 ENCOUNTER — Encounter: Payer: Self-pay | Admitting: Vascular Surgery

## 2013-12-29 ENCOUNTER — Ambulatory Visit (INDEPENDENT_AMBULATORY_CARE_PROVIDER_SITE_OTHER): Payer: Self-pay | Admitting: Vascular Surgery

## 2013-12-29 VITALS — BP 127/72 | HR 101 | Temp 98.1°F | Resp 16 | Ht 67.0 in | Wt 270.0 lb

## 2013-12-29 DIAGNOSIS — T814XXD Infection following a procedure, subsequent encounter: Secondary | ICD-10-CM | POA: Diagnosis not present

## 2013-12-29 DIAGNOSIS — I739 Peripheral vascular disease, unspecified: Secondary | ICD-10-CM

## 2013-12-29 DIAGNOSIS — I70201 Unspecified atherosclerosis of native arteries of extremities, right leg: Secondary | ICD-10-CM | POA: Diagnosis not present

## 2013-12-29 DIAGNOSIS — Z9981 Dependence on supplemental oxygen: Secondary | ICD-10-CM | POA: Diagnosis not present

## 2013-12-29 DIAGNOSIS — T8189XA Other complications of procedures, not elsewhere classified, initial encounter: Secondary | ICD-10-CM | POA: Diagnosis not present

## 2013-12-29 DIAGNOSIS — E119 Type 2 diabetes mellitus without complications: Secondary | ICD-10-CM | POA: Diagnosis not present

## 2013-12-29 NOTE — Progress Notes (Signed)
Subjective:     Patient ID: Stephanie Peck, female   DOB: 01/29/1961, 53 y.o.   MRN: [id number]  HPI this 53 year old female had right to left femoral-femoral bypass performed by Dr. Trula Slade on November 19 for severe claudication left leg. Over the past few days she has developed drainage from the right inguinal wound. She has had no chills and fever. She states that her left leg feels much better with ambulation.   Review of Systems     Objective:   Physical Exam BP 127/72 mmHg  Pulse 101  Temp(Src) 98.1 F (36.7 C)  Resp 16  Ht 5\' 7"  (1.702 m)  Wt 270 lb (122.471 kg)  BMI 42.28 kg/m2  Gen. morbidly obese female in no apparent distress alert and oriented 3 Right inguinal wound with serous drainage. No large lymphocele palpable beneath the wound. Drainage does not appear purulent. 3+ femoral-femoral graft pulse palpable. Left inguinal wound healing satisfactorily-skin staples removed  Culture of serosanguineous fluid from right inguinal wound sent     Assessment:     Serosanguineous drainage which appears lymphatic in nature from right inguinal wound status post right to left femoral-femoral bypass graft 11 days ago    Plan:     Culture of fluid sent Keflex 500 mg by mouth 4 times a day 2 weeks Will again VAC wound drainage today by home health nurse 3 times per week Patient to return Monday, December 6 for follow-up by Dr. Trula Slade If patient develops chills and fever at home she will be in touch with [place]

## 2013-12-30 DIAGNOSIS — I70201 Unspecified atherosclerosis of native arteries of extremities, right leg: Secondary | ICD-10-CM | POA: Diagnosis not present

## 2013-12-30 DIAGNOSIS — E119 Type 2 diabetes mellitus without complications: Secondary | ICD-10-CM | POA: Diagnosis not present

## 2013-12-30 DIAGNOSIS — T8189XA Other complications of procedures, not elsewhere classified, initial encounter: Secondary | ICD-10-CM | POA: Diagnosis not present

## 2013-12-30 DIAGNOSIS — Z9981 Dependence on supplemental oxygen: Secondary | ICD-10-CM | POA: Diagnosis not present

## 2013-12-31 DIAGNOSIS — Z9981 Dependence on supplemental oxygen: Secondary | ICD-10-CM | POA: Diagnosis not present

## 2013-12-31 DIAGNOSIS — E119 Type 2 diabetes mellitus without complications: Secondary | ICD-10-CM | POA: Diagnosis not present

## 2013-12-31 DIAGNOSIS — I70201 Unspecified atherosclerosis of native arteries of extremities, right leg: Secondary | ICD-10-CM | POA: Diagnosis not present

## 2013-12-31 DIAGNOSIS — T8189XA Other complications of procedures, not elsewhere classified, initial encounter: Secondary | ICD-10-CM | POA: Diagnosis not present

## 2014-01-01 DIAGNOSIS — E119 Type 2 diabetes mellitus without complications: Secondary | ICD-10-CM | POA: Diagnosis not present

## 2014-01-01 DIAGNOSIS — Z9981 Dependence on supplemental oxygen: Secondary | ICD-10-CM | POA: Diagnosis not present

## 2014-01-01 DIAGNOSIS — T8189XA Other complications of procedures, not elsewhere classified, initial encounter: Secondary | ICD-10-CM | POA: Diagnosis not present

## 2014-01-01 DIAGNOSIS — I70201 Unspecified atherosclerosis of native arteries of extremities, right leg: Secondary | ICD-10-CM | POA: Diagnosis not present

## 2014-01-01 LAB — WOUND CULTURE: GRAM STAIN: NONE SEEN

## 2014-01-04 ENCOUNTER — Encounter: Payer: Self-pay | Admitting: Surgery

## 2014-01-04 ENCOUNTER — Ambulatory Visit (INDEPENDENT_AMBULATORY_CARE_PROVIDER_SITE_OTHER): Payer: Medicare Other | Admitting: Surgery

## 2014-01-04 VITALS — BP 142/75 | HR 90 | Ht 67.0 in | Wt 270.0 lb

## 2014-01-04 DIAGNOSIS — Z9981 Dependence on supplemental oxygen: Secondary | ICD-10-CM | POA: Diagnosis not present

## 2014-01-04 DIAGNOSIS — T8189XA Other complications of procedures, not elsewhere classified, initial encounter: Secondary | ICD-10-CM | POA: Diagnosis not present

## 2014-01-04 DIAGNOSIS — I70201 Unspecified atherosclerosis of native arteries of extremities, right leg: Secondary | ICD-10-CM | POA: Diagnosis not present

## 2014-01-04 DIAGNOSIS — E119 Type 2 diabetes mellitus without complications: Secondary | ICD-10-CM | POA: Diagnosis not present

## 2014-01-04 DIAGNOSIS — I739 Peripheral vascular disease, unspecified: Secondary | ICD-10-CM

## 2014-01-04 NOTE — Progress Notes (Signed)
The patient is back today for follow-up.  On 12/17/2013 she underwent a right to left femoral-femoral bypass graft and stenting of her right superficial femoral artery.  She presented to the office last week with drainage from her right sided incision.  She has a history of a muscle flap by Dr. Migdalia Dk on the left.  I wound VAC was placed.  She has had a fair amount of drainage from her wound VAC.  Cultures showed multiple organisms and she is been on Keflex.  On examination the right wound actually looks very clean the incision is intact.  The left side has nearly completely healed.  The patient will continue with a wound VAC to the right groin.  She has another week of antibiotics.  I scheduled her for return follow-up 1 January 4.   Annamarie Major

## 2014-01-05 DIAGNOSIS — T8189XA Other complications of procedures, not elsewhere classified, initial encounter: Secondary | ICD-10-CM | POA: Diagnosis not present

## 2014-01-05 DIAGNOSIS — E119 Type 2 diabetes mellitus without complications: Secondary | ICD-10-CM | POA: Diagnosis not present

## 2014-01-05 DIAGNOSIS — I70201 Unspecified atherosclerosis of native arteries of extremities, right leg: Secondary | ICD-10-CM | POA: Diagnosis not present

## 2014-01-05 DIAGNOSIS — Z9981 Dependence on supplemental oxygen: Secondary | ICD-10-CM | POA: Diagnosis not present

## 2014-01-06 DIAGNOSIS — I70201 Unspecified atherosclerosis of native arteries of extremities, right leg: Secondary | ICD-10-CM | POA: Diagnosis not present

## 2014-01-06 DIAGNOSIS — Z9981 Dependence on supplemental oxygen: Secondary | ICD-10-CM | POA: Diagnosis not present

## 2014-01-06 DIAGNOSIS — E119 Type 2 diabetes mellitus without complications: Secondary | ICD-10-CM | POA: Diagnosis not present

## 2014-01-06 DIAGNOSIS — T8189XA Other complications of procedures, not elsewhere classified, initial encounter: Secondary | ICD-10-CM | POA: Diagnosis not present

## 2014-01-07 ENCOUNTER — Encounter (HOSPITAL_COMMUNITY): Payer: Self-pay | Admitting: Surgery

## 2014-01-08 DIAGNOSIS — E119 Type 2 diabetes mellitus without complications: Secondary | ICD-10-CM | POA: Diagnosis not present

## 2014-01-08 DIAGNOSIS — T8189XA Other complications of procedures, not elsewhere classified, initial encounter: Secondary | ICD-10-CM | POA: Diagnosis not present

## 2014-01-08 DIAGNOSIS — Z9981 Dependence on supplemental oxygen: Secondary | ICD-10-CM | POA: Diagnosis not present

## 2014-01-08 DIAGNOSIS — I70201 Unspecified atherosclerosis of native arteries of extremities, right leg: Secondary | ICD-10-CM | POA: Diagnosis not present

## 2014-01-11 ENCOUNTER — Telehealth: Payer: Self-pay | Admitting: Cardiology

## 2014-01-11 ENCOUNTER — Telehealth: Payer: Self-pay

## 2014-01-11 DIAGNOSIS — I70201 Unspecified atherosclerosis of native arteries of extremities, right leg: Secondary | ICD-10-CM | POA: Diagnosis not present

## 2014-01-11 DIAGNOSIS — E119 Type 2 diabetes mellitus without complications: Secondary | ICD-10-CM | POA: Diagnosis not present

## 2014-01-11 DIAGNOSIS — Z9981 Dependence on supplemental oxygen: Secondary | ICD-10-CM | POA: Diagnosis not present

## 2014-01-11 DIAGNOSIS — T8189XA Other complications of procedures, not elsewhere classified, initial encounter: Secondary | ICD-10-CM | POA: Diagnosis not present

## 2014-01-11 MED ORDER — PRASUGREL HCL 10 MG PO TABS: 10.0000 mg | ORAL_TABLET | Freq: Every day | ORAL | Status: AC

## 2014-01-11 MED ORDER — ASPIRIN EC 81 MG PO TBEC: 81.0000 mg | DELAYED_RELEASE_TABLET | Freq: Every day | ORAL | Status: AC

## 2014-01-11 NOTE — Telephone Encounter (Signed)
New Message  Pt has had increased SOB that has not been relieved from Oxygen or CPAP machine// SOB is mostly at night when she is going to bed.. Please call back to discuss//sr

## 2014-01-11 NOTE — Telephone Encounter (Signed)
rec'd call from [facility] RN re: update on pt's wound status.  Reported the right groin wound is very superficial; reported 0.3 cm wide, 9 cm. Long (stated length of the incisional area, but not open the entire area), and approx. 0.1 cm depth.  Stated it is no longer draining.  Requesting to discontinue the wound vac due to the improvement.  Also reported that pt. C/o being SOB., "mostly at night."  Was advised to watch out for symptoms of SOB with the Brilinta. Reported there is slight edema in the ankles; lungs are clear.  Reported the above to Dr. Trula Slade.  Advised may discontinue the wound vac and apply wet-dry NS dressing daily.  Re: SOB, Dr. Trula Slade recommended to continue the Brilinta.  Stated she really needs to stay on it.  Advised the [facility] RN to report the SOB and ankle edema to the Cardiologist.  Gave orders per Dr. Trula Slade that okay to d/c wound vac.  Per [facility] RN, will discuss w/ [facility] if any alternative to the Brilinta.

## 2014-01-11 NOTE — Telephone Encounter (Signed)
New message      Returning Pam's call

## 2014-01-11 NOTE — Telephone Encounter (Signed)
Per Dr Marlou Porch - change from Brilinta to Effient 10 mg once a day and make sure she is taking ASA 81 mg a day.  Pt is aware of changes and states understanding.  RX sent into pharmacy.  She will call back if s/s do not improve.

## 2014-01-11 NOTE — Telephone Encounter (Signed)
Discussed with pt - left message for [facility] of medication change and to call back if she has further needs

## 2014-01-11 NOTE — Telephone Encounter (Signed)
Discussed with Dr. Trula Slade.  Questioned about any alternative to the [place].  Stated the only other alternative would be Plavix, and the pt. Is not able to take Plavix.  Notified the [facility] RN of Dr. Stephens Shire recommendation to continue Brilinta, since pt. Not able to tolerate Plavix.  Spoke with [employer].  Verb. Understanding.

## 2014-01-11 NOTE — Telephone Encounter (Signed)
Spoke with patient.  SOB has been going on for about 1 week.  She denies any edema, cough or changes in weight.  She is propping up on 3 pillows at night and is using a hospital bed but this doesn't make a difference.  She does not feel like this SOB is caused by her HX of CHF.  She does have Lasix to use prn however doesn't feel like she needs it.  She also mentions the SOB started since her surgery and being started on Brilinta.  Aware I will discuss with Dr Marlou Porch and call her back.

## 2014-01-11 NOTE — Telephone Encounter (Signed)
Changed to Effient. Brinlinta can cause shortness of breath.

## 2014-01-13 DIAGNOSIS — T8189XA Other complications of procedures, not elsewhere classified, initial encounter: Secondary | ICD-10-CM | POA: Diagnosis not present

## 2014-01-13 DIAGNOSIS — Z9981 Dependence on supplemental oxygen: Secondary | ICD-10-CM | POA: Diagnosis not present

## 2014-01-13 DIAGNOSIS — E119 Type 2 diabetes mellitus without complications: Secondary | ICD-10-CM | POA: Diagnosis not present

## 2014-01-13 DIAGNOSIS — I70201 Unspecified atherosclerosis of native arteries of extremities, right leg: Secondary | ICD-10-CM | POA: Diagnosis not present

## 2014-01-15 DIAGNOSIS — E119 Type 2 diabetes mellitus without complications: Secondary | ICD-10-CM | POA: Diagnosis not present

## 2014-01-15 DIAGNOSIS — T8189XA Other complications of procedures, not elsewhere classified, initial encounter: Secondary | ICD-10-CM | POA: Diagnosis not present

## 2014-01-15 DIAGNOSIS — I70201 Unspecified atherosclerosis of native arteries of extremities, right leg: Secondary | ICD-10-CM | POA: Diagnosis not present

## 2014-01-15 DIAGNOSIS — Z9981 Dependence on supplemental oxygen: Secondary | ICD-10-CM | POA: Diagnosis not present

## 2014-01-19 ENCOUNTER — Telehealth: Payer: Self-pay

## 2014-01-19 DIAGNOSIS — I70201 Unspecified atherosclerosis of native arteries of extremities, right leg: Secondary | ICD-10-CM | POA: Diagnosis not present

## 2014-01-19 DIAGNOSIS — T8189XA Other complications of procedures, not elsewhere classified, initial encounter: Secondary | ICD-10-CM | POA: Diagnosis not present

## 2014-01-19 DIAGNOSIS — Z9981 Dependence on supplemental oxygen: Secondary | ICD-10-CM | POA: Diagnosis not present

## 2014-01-19 DIAGNOSIS — E119 Type 2 diabetes mellitus without complications: Secondary | ICD-10-CM | POA: Diagnosis not present

## 2014-01-19 NOTE — Telephone Encounter (Signed)
Phone call from pt's [facility] RN to give (R) groin wound update; reported that the "right groin wound has opened-up a little"; reports that there is a "clear drainage with a red tinge."   Stated the drainage is minimal.  Reports no odor to drainage.  Denies any fever.  Pt. has f/u appt. 02/01/14.  Jinny Blossom, RN from [facility] gave instructions to pt. to call office if symptoms of fever or worsening drainage (ie: odor or thick, purulent dnge.) develops.  Jinny Blossom stated she will see pt. On 12/24, and reassess the wound.

## 2014-01-21 ENCOUNTER — Encounter: Payer: Self-pay | Admitting: Family

## 2014-01-21 ENCOUNTER — Telehealth: Payer: Self-pay

## 2014-01-21 ENCOUNTER — Ambulatory Visit (INDEPENDENT_AMBULATORY_CARE_PROVIDER_SITE_OTHER): Payer: Self-pay | Admitting: Family

## 2014-01-21 VITALS — BP 135/69 | HR 80 | Temp 98.7°F | Resp 16 | Ht 67.0 in | Wt 270.0 lb

## 2014-01-21 DIAGNOSIS — I739 Peripheral vascular disease, unspecified: Secondary | ICD-10-CM

## 2014-01-21 DIAGNOSIS — R1030 Lower abdominal pain, unspecified: Secondary | ICD-10-CM

## 2014-01-21 DIAGNOSIS — T148XXA Other injury of unspecified body region, initial encounter: Secondary | ICD-10-CM | POA: Insufficient documentation

## 2014-01-21 DIAGNOSIS — IMO0002 Reserved for concepts with insufficient information to code with codable children: Secondary | ICD-10-CM

## 2014-01-21 DIAGNOSIS — E119 Type 2 diabetes mellitus without complications: Secondary | ICD-10-CM | POA: Diagnosis not present

## 2014-01-21 DIAGNOSIS — L24A9 Irritant contact dermatitis due friction or contact with other specified body fluids: Secondary | ICD-10-CM | POA: Insufficient documentation

## 2014-01-21 DIAGNOSIS — Z4889 Encounter for other specified surgical aftercare: Secondary | ICD-10-CM

## 2014-01-21 DIAGNOSIS — Z9981 Dependence on supplemental oxygen: Secondary | ICD-10-CM | POA: Diagnosis not present

## 2014-01-21 DIAGNOSIS — Z87891 Personal history of nicotine dependence: Secondary | ICD-10-CM

## 2014-01-21 DIAGNOSIS — I70201 Unspecified atherosclerosis of native arteries of extremities, right leg: Secondary | ICD-10-CM | POA: Diagnosis not present

## 2014-01-21 DIAGNOSIS — E1165 Type 2 diabetes mellitus with hyperglycemia: Secondary | ICD-10-CM

## 2014-01-21 DIAGNOSIS — T8189XA Other complications of procedures, not elsewhere classified, initial encounter: Secondary | ICD-10-CM | POA: Diagnosis not present

## 2014-01-21 NOTE — Progress Notes (Signed)
[facility] [place] HISTORY AND PHYSICAL -PAD  History of Present Illness Stephanie Peck is a 53 y.o. female patient of Dr. Trula Slade who is S/P Fem-Fem BPG, Fem-Art. stent and Right iliac stent 12-17-13.  Patient returns today with c/o left groin knifelike intermitent pain that started yesterday and right groin area incision small area of dehiscence for about a week. Home health is changing her right groin dressing 2-3 times/week. Her DM is uncontrolled, states she is working with her DM medical provider to get better control, she stopped smoking in 2013.  Dr. Trula Slade saw pt on 01/04/14, his note is as follows: She presented to the office last week with drainage from her right sided incision. She has a history of a muscle flap by Dr. Migdalia Dk on the left. Wound VAC was placed. She has had a fair amount of drainage from her wound VAC. Cultures showed multiple organisms and she is been on Keflex. On examination the right wound actually looks very clean the incision is intact. The left side has nearly completely healed. The patient will continue with a wound VAC to the right groin. She has another week of antibiotics. I scheduled her for return follow-up January 4.  The pt no longer has a wound vac. Her left great toe was cyanotic before the above revascularization procedure, is less cyanotic since the procedure. She has no non healing wounds in her feet or lower legs.   Pt Diabetic: Yes Pt smoker: former smoker, quit in 2013  Pt meds include: Statin :Yes Betablocker: No ASA: Yes Other anticoagulants/antiplatelets: Effient  Past Medical History  Diagnosis Date  . GERD (gastroesophageal reflux disease)   . Anxiety   . CHF (congestive heart failure)   . COPD (chronic obstructive pulmonary disease)   . Depression   . Morbid obesity   . Hypertension   . Asthma   . On home oxygen therapy     "2L q hs" (12/19/2013)  . Chronic bronchitis     "get it pretty much  q yr"  . OSA on CPAP     severe OSA by 09/08/06 sleep study ([facility])  . Type II diabetes mellitus   . Peripheral vascular disease     Social History History  Substance Use Topics  . Smoking status: Former Smoker -- 2.00 packs/day for 20 years    Types: Cigarettes    Start date: 09/30/2011    Quit date: 10/30/2011  . Smokeless tobacco: Never Used  . Alcohol Use: No    Family History Family History  Problem Relation Age of Onset  . Cancer Mother   . Depression Mother   . Heart disease Father     before age 55  . Diabetes Father   . Hypertension Father   . Hyperlipidemia Father   . Heart attack Father   . Cancer Maternal Grandmother     Past Surgical History  Procedure Laterality Date  . Cholecystectomy    . Roux-en-y gastric bypass  2006  . Tonsillectomy    . Femoral artery stent Right 12/05/2011    superficial   . Endarterectomy femoral  02/22/2012    Procedure: ENDARTERECTOMY FEMORAL;  Surgeon: Serafina Mitchell, MD;  Location: [place];  Service: Vascular;  Laterality: Left;  . Patch angioplasty  02/22/2012    Procedure: PATCH ANGIOPLASTY;  Surgeon: Serafina Mitchell, MD;  Location: [facility] OR;  Service: Vascular;  Laterality: Left;  left femoral patch angioplasty  . Application of wound vac  02/22/2012    Procedure: APPLICATION OF WOUND VAC;  Surgeon: Serafina Mitchell, MD;  Location: [place];  Service: Vascular;  Laterality: Left;  application wound vac  . Groin debridement  03/01/2012    Procedure: GROIN DEBRIDEMENT;  Surgeon: Rosetta Posner, MD;  Location: [place];  Service: Vascular;  Laterality: Left;  . Aortogram Left 04/04/2012    Procedure: AORTOGRAM;  Surgeon: Serafina Mitchell, MD;  Location: [place];  Service: Vascular;  Laterality: Left;  . Patch angioplasty Left 04/04/2012    Procedure: PATCH ANGIOPLASTY;  Surgeon: Serafina Mitchell, MD;  Location: [place];  Service: Vascular;  Laterality: Left;  Marland Kitchen Muscle flap closure Left 04/04/2012    Procedure: MUSCLE FLAP CLOSURE;  Surgeon: Theodoro Kos, DO;  Location: [place];  Service: Plastics;  Laterality: Left;  . Irrigation and debridement abscess Left 03/05/2013    Procedure: IRRIGATION AND DEBRIDEMENT ABSCESS;  Surgeon: Zenovia Jarred, MD;  Location: [place];  Service: General;  Laterality: Left;  . Breast biopsy Right ~ 2005  . Femoral artery - femoral artery bypass graft  12/17/2013  . Femoral artery stent Right 12/17/2013  . Femoral-femoral bypass graft N/A 12/17/2013    Procedure: BYPASS GRAFT FEMORAL-FEMORAL ARTERY-RIGHT TO LEFT;  Surgeon: Serafina Mitchell, MD;  Location: [place];  Service: Vascular;  Laterality: N/A;  . Insertion of iliac stent Right 12/17/2013    Procedure:  Insertion of Stent Right Superficial Femoral Artery ;  Surgeon: Serafina Mitchell, MD;  Location: [place];  Service: Vascular;  Laterality: Right;  . Lower extremity angiogram Bilateral 12/05/2011    Procedure: LOWER EXTREMITY ANGIOGRAM;  Surgeon: Serafina Mitchell, MD;  Location: [facility] CATH LAB;  Service: Cardiovascular;  Laterality: Bilateral;  . Abdominal aortagram N/A 12/09/2012    Procedure: ABDOMINAL AORTAGRAM;  Surgeon: Serafina Mitchell, MD;  Location: [facility] C[facility] LAB;  Service: Cardiovascular;  Laterality: N/A;  . Abdominal aortagram N/A 11/24/2013    Procedure: ABDOMINAL AORTAGRAM;  Surgeon: Serafina Mitchell, MD;  Location: [facility] CATH LAB;  Service: Cardiovascular;  Laterality: N/A;    Allergies  Allergen Reactions  . Celebrex [Celecoxib] Shortness Of Breath  . Codeine Nausea Only    Current Outpatient Prescriptions  Medication Sig Dispense Refill  . albuterol (PROVENTIL HFA;VENTOLIN HFA) 108 (90 BASE) MCG/ACT inhaler Inhale 1 puff into the lungs every 6 (six) hours as needed for wheezing or shortness of breath.    Marland Kitchen aspirin EC 81 MG tablet Take 1 tablet (81 mg total) by mouth daily.    Marland Kitchen atorvastatin (LIPITOR) 20 MG tablet Take 1 tablet (20 mg total) by mouth daily. 90 tablet 3  . BD PEN NEEDLE NANO U/F 32G X 4 MM MISC     . buPROPion (WELLBUTRIN XL) 300 MG  24 hr tablet Take 300 mg by mouth daily.    . clindamycin (CLINDAGEL) 1 % gel Apply 1 application topically 2 (two) times daily.     . CVS STOOL SOFTENER 100 MG capsule Take 100 mg by mouth daily.   3  . cyclobenzaprine (FLEXERIL) 10 MG tablet Take 10 mg by mouth 2 (two) times daily as needed for muscle spasms.     . DULoxetine (CYMBALTA) 60 MG capsule Take 60 mg by mouth daily.    . Ferrous Sulfate (SLOW FE PO) Take 1 tablet by mouth daily.    . insulin aspart (NOVOLOG) 100 UNIT/ML injection Inject 14 Units into the skin at bedtime.     . insulin  detemir (LEVEMIR) 100 UNIT/ML injection Inject 64 Units into the skin daily.    Marland Kitchen JANUVIA 50 MG tablet Take 50 mg by mouth daily.     Marland Kitchen lisinopril (PRINIVIL,ZESTRIL) 2.5 MG tablet Take 2.5 mg by mouth daily.     Marland Kitchen losartan (COZAAR) 50 MG tablet Take 50 mg by mouth daily.    . Multiple Vitamins-Minerals (MULTIVITAMIN WITH MINERALS) tablet Take 1 tablet by mouth daily.    Marland Kitchen omeprazole (PRILOSEC) 40 MG capsule Take 40 mg by mouth 2 (two) times daily.    Marland Kitchen OVER THE COUNTER MEDICATION Take 630 mg by mouth daily. OTC Calcium    . oxyCODONE-acetaminophen (PERCOCET/ROXICET) 5-325 MG per tablet Take 1 tablet by mouth every 6 (six) hours as needed (pain).   0  . oxyCODONE-acetaminophen (PERCOCET/ROXICET) 5-325 MG per tablet Take 1-2 tablets by mouth every 4 (four) hours as needed for moderate pain. 30 tablet 0  . prasugrel (EFFIENT) 10 MG TABS tablet Take 1 tablet (10 mg total) by mouth daily. 30 tablet 11  . promethazine (PHENERGAN) 25 MG tablet Take 25 mg by mouth every 6 (six) hours as needed for nausea or vomiting.     . topiramate (TOPAMAX) 50 MG tablet Take 1 tablet (50 mg total) by mouth daily. 30 tablet 3  . vitamin B-12 (CYANOCOBALAMIN) 500 MCG tablet Take 500 mcg by mouth daily.    . CHANTIX STARTING MONTH PAK 0.5 MG X 11 & 1 MG X 42 tablet      No current facility-administered medications for this visit.    ROS: See HPI for pertinent positives and  negatives.   Physical Examination  Filed Vitals:   01/21/14 1119  BP: 135/69  Pulse: 80  Temp: 98.7 F (37.1 C)  TempSrc: Oral  Resp: 16  Height: 5\' 7"  (1.702 m)  Weight: 270 lb (122.471 kg)  SpO2: 100%   Body mass index is 42.28 kg/(m^2).  General: A&O x 3, WDWN, morbidly obese female. Gait: normal Eyes: PERRLA. Pulmonary: Respirations are non labored. Cardiac: regular Rhythm.    Aorta is not palpable. Radial pulses: are 2+ palpable and =                           VASCULAR EXAM: Extremities with ischemic changes mild cyanosis left great toe  without Gangrene; with small area of right groin incision dehiscence, about 1 cm, no swelling, minimal erythema, no drainage from incision. Left groin incision edges are well proximated.                                                                                                          LE Pulses Right Left       FEMORAL  not palpable, large panus  not palpable, large panus        POPLITEAL  not palpable   not palpable       POSTERIOR TIBIAL  monophasic by Doppler    monophasic by Doppler         DORSALIS PEDIS  ANTERIOR TIBIAL biphasic by Doppler  monophasic by Doppler         PERONEAL monophasic by Doppler    monophasic by Doppler     Abdomen: soft, NT, no palpable masses, obese abdomen with large panus. Skin: no rashes, no ulcers. Musculoskeletal: no muscle wasting or atrophy.  Neurologic: A&O X 3; Appropriate Affect ; SENSATION: normal; MOTOR FUNCTION:  moving all extremities equally, motor strength 5/5 throughout. Speech is fluent/normal.  CN 2-12 grossly intact.    ASSESSMENT: KYNNEDY CARRENO is a 53 y.o. female who is S/P Fem-Fem BPG, Fem-Art. stent and Right iliac stent 12-17-13. Right groin incision is healing well with no signs of infection, small dehisced area on right groin incision, about 1 cm, no drainage; home health performs dressing changes and wound evaluation. Left groin incision edges are well  proximated. Obese abdomen hinders palpation of femoral pulses; pedal pulses are all audible by Doppler.  Cyanosis in left great toe is improving since the above procedure. Dr. Donnetta Hutching spoke with and examined pt. The knifelike intermittent pain in her left groin is likely due to nerve irritation from groin surgical intervention and is expected to resolve over a short period of time.    PLAN:  I discussed in depth with the patient the nature of atherosclerosis, and emphasized the importance of maximal medical management including strict control of blood pressure, blood glucose, and lipid levels, obtaining regular exercise, and continued cessation of smoking.  The patient is aware that without maximal medical management the underlying atherosclerotic disease process will progress, limiting the benefit of any interventions.  Based on the patient's vascular studies and examination, pt will return to clinic on 02/01/14 as already scheduled with Dr. Trula Slade; she knows to call sooner for concerns or problems.  The patient was given information about PAD including signs, symptoms, treatment, what symptoms should prompt the patient to seek immediate medical care, and risk reduction measures to take.  Clemon Chambers, RN, MSN, FNP-C [facility] Phone: [phone]  Clinic MD: Early   01/21/2014 11:28 AM

## 2014-01-21 NOTE — Patient Instructions (Signed)

## 2014-01-21 NOTE — Telephone Encounter (Signed)
Phone call from pt's [facility] RN.  Reported pt c/o increased pain in the right groin, and noted a new hard knot beneath the skin.  Stated there is serosanguinous draininage from (R) groin wound.  Also, reported pt. has new redness to the (L) groin area. Denies fever.  Requested appt. to evaluate.  Call placed to pt.  Appt. given for 11:40 AM today; advised to come in to the office now.  Verb. Understanding.

## 2014-01-26 ENCOUNTER — Encounter: Payer: Self-pay | Admitting: Surgery

## 2014-01-26 DIAGNOSIS — Z9981 Dependence on supplemental oxygen: Secondary | ICD-10-CM | POA: Diagnosis not present

## 2014-01-26 DIAGNOSIS — I70201 Unspecified atherosclerosis of native arteries of extremities, right leg: Secondary | ICD-10-CM | POA: Diagnosis not present

## 2014-01-26 DIAGNOSIS — E119 Type 2 diabetes mellitus without complications: Secondary | ICD-10-CM | POA: Diagnosis not present

## 2014-01-26 DIAGNOSIS — T8189XA Other complications of procedures, not elsewhere classified, initial encounter: Secondary | ICD-10-CM | POA: Diagnosis not present

## 2014-01-28 DIAGNOSIS — I70201 Unspecified atherosclerosis of native arteries of extremities, right leg: Secondary | ICD-10-CM | POA: Diagnosis not present

## 2014-01-28 DIAGNOSIS — E119 Type 2 diabetes mellitus without complications: Secondary | ICD-10-CM | POA: Diagnosis not present

## 2014-01-28 DIAGNOSIS — T8189XA Other complications of procedures, not elsewhere classified, initial encounter: Secondary | ICD-10-CM | POA: Diagnosis not present

## 2014-01-28 DIAGNOSIS — Z9981 Dependence on supplemental oxygen: Secondary | ICD-10-CM | POA: Diagnosis not present

## 2014-02-01 ENCOUNTER — Encounter: Payer: Self-pay | Admitting: Surgery

## 2014-02-01 ENCOUNTER — Ambulatory Visit (INDEPENDENT_AMBULATORY_CARE_PROVIDER_SITE_OTHER): Payer: Self-pay | Admitting: Surgery

## 2014-02-01 VITALS — BP 143/68 | HR 102 | Ht 67.0 in | Wt 272.0 lb

## 2014-02-01 DIAGNOSIS — Z4889 Encounter for other specified surgical aftercare: Secondary | ICD-10-CM

## 2014-02-01 DIAGNOSIS — I739 Peripheral vascular disease, unspecified: Secondary | ICD-10-CM

## 2014-02-01 DIAGNOSIS — Z48812 Encounter for surgical aftercare following surgery on the circulatory system: Secondary | ICD-10-CM

## 2014-02-01 NOTE — Addendum Note (Signed)
Addended by: Mena Goes on: 02/01/2014 05:19 PM   Modules accepted: Orders

## 2014-02-01 NOTE — Progress Notes (Signed)
The patient is back today for follow-up. On 12/17/2013 she underwent a right to left femoral-femoral bypass graft and stenting of her right superficial femoral artery. She presented postoperatively to the office  with drainage from her right sided incision. She has a history of a muscle flap by Dr. Migdalia Dk on the left. A wound VAC was placed in the right groin. She has had a fair amount of drainage from her wound VAC. Cultures showed multiple organisms and she is been on Keflex.  She states that the right groin incision had healed but then recently opened back up.  On examination the right wound actually looks very clean the incision is nearly intact. The left side has nearly completely healed.  I have encouraged her to keep the wound dry so they can go ahead and continue to heal.  She will follow up in 6 weeks with a duplex of her lower extremities as well as her femoral-femoral bypass graft.

## 2014-02-02 ENCOUNTER — Other Ambulatory Visit: Payer: Self-pay | Admitting: Internal Medicine

## 2014-02-02 ENCOUNTER — Ambulatory Visit
Admission: RE | Admit: 2014-02-02 | Discharge: 2014-02-02 | Disposition: A | Discharge status: Home / Self Care | Payer: Medicare Other | Source: Ambulatory Visit | Attending: Internal Medicine | Admitting: Internal Medicine

## 2014-02-02 DIAGNOSIS — N183 Chronic kidney disease, stage 3 (moderate): Secondary | ICD-10-CM | POA: Diagnosis not present

## 2014-02-02 DIAGNOSIS — R05 Cough: Secondary | ICD-10-CM

## 2014-02-02 DIAGNOSIS — E1121 Type 2 diabetes mellitus with diabetic nephropathy: Secondary | ICD-10-CM | POA: Diagnosis not present

## 2014-02-02 DIAGNOSIS — R059 Cough, unspecified: Secondary | ICD-10-CM

## 2014-02-02 DIAGNOSIS — J9809 Other diseases of bronchus, not elsewhere classified: Secondary | ICD-10-CM | POA: Diagnosis not present

## 2014-02-02 DIAGNOSIS — Z87891 Personal history of nicotine dependence: Secondary | ICD-10-CM | POA: Diagnosis not present

## 2014-02-03 DIAGNOSIS — I70201 Unspecified atherosclerosis of native arteries of extremities, right leg: Secondary | ICD-10-CM | POA: Diagnosis not present

## 2014-02-03 DIAGNOSIS — E119 Type 2 diabetes mellitus without complications: Secondary | ICD-10-CM | POA: Diagnosis not present

## 2014-02-03 DIAGNOSIS — T8189XA Other complications of procedures, not elsewhere classified, initial encounter: Secondary | ICD-10-CM | POA: Diagnosis not present

## 2014-02-03 DIAGNOSIS — Z9981 Dependence on supplemental oxygen: Secondary | ICD-10-CM | POA: Diagnosis not present

## 2014-02-10 DIAGNOSIS — Z9981 Dependence on supplemental oxygen: Secondary | ICD-10-CM | POA: Diagnosis not present

## 2014-02-10 DIAGNOSIS — T8189XA Other complications of procedures, not elsewhere classified, initial encounter: Secondary | ICD-10-CM | POA: Diagnosis not present

## 2014-02-10 DIAGNOSIS — E119 Type 2 diabetes mellitus without complications: Secondary | ICD-10-CM | POA: Diagnosis not present

## 2014-02-10 DIAGNOSIS — I70201 Unspecified atherosclerosis of native arteries of extremities, right leg: Secondary | ICD-10-CM | POA: Diagnosis not present

## 2014-02-11 DIAGNOSIS — R252 Cramp and spasm: Secondary | ICD-10-CM | POA: Diagnosis not present

## 2014-02-16 DIAGNOSIS — T8189XA Other complications of procedures, not elsewhere classified, initial encounter: Secondary | ICD-10-CM | POA: Diagnosis not present

## 2014-02-16 DIAGNOSIS — Z9981 Dependence on supplemental oxygen: Secondary | ICD-10-CM | POA: Diagnosis not present

## 2014-02-16 DIAGNOSIS — I70201 Unspecified atherosclerosis of native arteries of extremities, right leg: Secondary | ICD-10-CM | POA: Diagnosis not present

## 2014-02-16 DIAGNOSIS — E119 Type 2 diabetes mellitus without complications: Secondary | ICD-10-CM | POA: Diagnosis not present

## 2014-02-24 DIAGNOSIS — Z9981 Dependence on supplemental oxygen: Secondary | ICD-10-CM | POA: Diagnosis not present

## 2014-02-24 DIAGNOSIS — E119 Type 2 diabetes mellitus without complications: Secondary | ICD-10-CM | POA: Diagnosis not present

## 2014-02-24 DIAGNOSIS — I70201 Unspecified atherosclerosis of native arteries of extremities, right leg: Secondary | ICD-10-CM | POA: Diagnosis not present

## 2014-02-24 DIAGNOSIS — T8189XA Other complications of procedures, not elsewhere classified, initial encounter: Secondary | ICD-10-CM | POA: Diagnosis not present

## 2014-03-24 DIAGNOSIS — F330 Major depressive disorder, recurrent, mild: Secondary | ICD-10-CM | POA: Diagnosis not present

## 2014-03-24 DIAGNOSIS — K219 Gastro-esophageal reflux disease without esophagitis: Secondary | ICD-10-CM | POA: Diagnosis not present

## 2014-03-24 DIAGNOSIS — N183 Chronic kidney disease, stage 3 (moderate): Secondary | ICD-10-CM | POA: Diagnosis not present

## 2014-03-24 DIAGNOSIS — Z720 Tobacco use: Secondary | ICD-10-CM | POA: Diagnosis not present

## 2014-03-24 DIAGNOSIS — Z79899 Other long term (current) drug therapy: Secondary | ICD-10-CM | POA: Diagnosis not present

## 2014-03-24 DIAGNOSIS — E1122 Type 2 diabetes mellitus with diabetic chronic kidney disease: Secondary | ICD-10-CM | POA: Diagnosis not present

## 2014-03-24 DIAGNOSIS — G8929 Other chronic pain: Secondary | ICD-10-CM | POA: Diagnosis not present

## 2014-03-26 ENCOUNTER — Encounter: Payer: Self-pay | Admitting: Surgery

## 2014-03-26 ENCOUNTER — Other Ambulatory Visit: Payer: Self-pay | Admitting: *Deleted

## 2014-03-26 DIAGNOSIS — Z48812 Encounter for surgical aftercare following surgery on the circulatory system: Secondary | ICD-10-CM

## 2014-03-26 DIAGNOSIS — I739 Peripheral vascular disease, unspecified: Secondary | ICD-10-CM

## 2014-03-29 ENCOUNTER — Ambulatory Visit (INDEPENDENT_AMBULATORY_CARE_PROVIDER_SITE_OTHER): Payer: Medicare Other | Admitting: Surgery

## 2014-03-29 ENCOUNTER — Other Ambulatory Visit: Payer: Self-pay

## 2014-03-29 ENCOUNTER — Encounter: Payer: Self-pay | Admitting: Surgery

## 2014-03-29 ENCOUNTER — Ambulatory Visit (HOSPITAL_COMMUNITY)
Admission: RE | Admit: 2014-03-29 | Discharge: 2014-03-29 | Disposition: A | Discharge status: Home / Self Care | Payer: Medicare Other | Source: Ambulatory Visit | Attending: Surgery | Admitting: Surgery

## 2014-03-29 ENCOUNTER — Ambulatory Visit (INDEPENDENT_AMBULATORY_CARE_PROVIDER_SITE_OTHER)
Admission: RE | Admit: 2014-03-29 | Discharge: 2014-03-29 | Disposition: A | Discharge status: Home / Self Care | Payer: Medicare Other | Source: Ambulatory Visit | Attending: Surgery | Admitting: Surgery

## 2014-03-29 ENCOUNTER — Other Ambulatory Visit: Payer: Self-pay | Admitting: Surgery

## 2014-03-29 VITALS — BP 122/49 | HR 65 | Resp 16 | Ht 67.0 in | Wt 270.0 lb

## 2014-03-29 DIAGNOSIS — Z48812 Encounter for surgical aftercare following surgery on the circulatory system: Secondary | ICD-10-CM | POA: Diagnosis not present

## 2014-03-29 DIAGNOSIS — I739 Peripheral vascular disease, unspecified: Secondary | ICD-10-CM

## 2014-03-29 DIAGNOSIS — Z4889 Encounter for other specified surgical aftercare: Secondary | ICD-10-CM

## 2014-03-29 NOTE — Progress Notes (Signed)
Patient name: Stephanie Peck MRN: [id number] DOB: 18-May-1960 Sex: female     Chief Complaint  Patient presents with  . Re-evaluation    S/P Right Fem-Fem BPG  12-17-13      HISTORY OF PRESENT ILLNESS: The patient is here today for followup. On 04/07/2012 she underwent redo left femoral endarterectomy with patch angioplasty. I previously performed a left femoral endarterectomy and this has occluded. She came in with numbness to her left foot  The patient is back today for follow-up. On 12/17/2013 she underwent a right to left femoral-femoral bypass graft and stenting of her right superficial femoral artery. She presented postoperatively to the office with drainage from her right sided incision. She has a history of a muscle flap by Dr. Migdalia Dk on the left .  She reports having difficulty with walking approximately 100 feet which is slightly worse than previous    Past Medical History  Diagnosis Date  . GERD (gastroesophageal reflux disease)   . Anxiety   . CHF (congestive heart failure)   . COPD (chronic obstructive pulmonary disease)   . Depression   . Morbid obesity   . Hypertension   . Asthma   . On home oxygen therapy     "2L q hs" (12/19/2013)  . Chronic bronchitis     "get it pretty much q yr"  . OSA on CPAP     severe OSA by 09/08/06 sleep study ([facility])  . Type II diabetes mellitus   . Peripheral vascular disease     Past Surgical History  Procedure Laterality Date  . Cholecystectomy    . Roux-en-y gastric bypass  2006  . Tonsillectomy    . Femoral artery stent Right 12/05/2011    superficial   . Endarterectomy femoral  02/22/2012    Procedure: ENDARTERECTOMY FEMORAL;  Surgeon: Serafina Mitchell, MD;  Location: [place];  Service: Vascular;  Laterality: Left;  . Patch angioplasty  02/22/2012    Procedure: PATCH ANGIOPLASTY;  Surgeon: Serafina Mitchell, MD;  Location: [facility] OR;  Service: Vascular;  Laterality: Left;  left femoral patch angioplasty  . Application of  wound vac  02/22/2012    Procedure: APPLICATION OF WOUND VAC;  Surgeon: Serafina Mitchell, MD;  Location: [place];  Service: Vascular;  Laterality: Left;  application wound vac  . Groin debridement  03/01/2012    Procedure: GROIN DEBRIDEMENT;  Surgeon: Rosetta Posner, MD;  Location: [place];  Service: Vascular;  Laterality: Left;  . Aortogram Left 04/04/2012    Procedure: AORTOGRAM;  Surgeon: Serafina Mitchell, MD;  Location: [place];  Service: Vascular;  Laterality: Left;  . Patch angioplasty Left 04/04/2012    Procedure: PATCH ANGIOPLASTY;  Surgeon: Serafina Mitchell, MD;  Location: [place];  Service: Vascular;  Laterality: Left;  Marland Kitchen Muscle flap closure Left 04/04/2012    Procedure: MUSCLE FLAP CLOSURE;  Surgeon: Theodoro Kos, DO;  Location: [place];  Service: Plastics;  Laterality: Left;  . Irrigation and debridement abscess Left 03/05/2013    Procedure: IRRIGATION AND DEBRIDEMENT ABSCESS;  Surgeon: Zenovia Jarred, MD;  Location: [place];  Service: General;  Laterality: Left;  . Breast biopsy Right ~ 2005  . Femoral artery - femoral artery bypass graft  12/17/2013  . Femoral artery stent Right 12/17/2013  . Femoral-femoral bypass graft N/A 12/17/2013    Procedure: BYPASS GRAFT FEMORAL-FEMORAL ARTERY-RIGHT TO LEFT;  Surgeon: Serafina Mitchell, MD;  Location: [place];  Service: Vascular;  Laterality:  N/A;  . Insertion of iliac stent Right 12/17/2013    Procedure:  Insertion of Stent Right Superficial Femoral Artery ;  Surgeon: Serafina Mitchell, MD;  Location: [place];  Service: Vascular;  Laterality: Right;  . Lower extremity angiogram Bilateral 12/05/2011    Procedure: LOWER EXTREMITY ANGIOGRAM;  Surgeon: Serafina Mitchell, MD;  [facility] C[facility] LAB;  Service: Cardiovascular;  Laterality: Bilateral;  . Abdominal aortagram N/A 12/09/2012    Procedure: ABDOMINAL AORTAGRAM;  Surgeon: Serafina Mitchell, MD;  Location: [facility] CATH LAB;  Service: Cardiovascular;  Laterality: N/A;  . Abdominal aortagram N/A 11/24/2013    Procedure:  ABDOMINAL AORTAGRAM;  Surgeon: Serafina Mitchell, MD;  Location: [facility] CATH LAB;  Service: Cardiovascular;  Laterality: N/A;    History   Social History  . Marital Status: Married    Spouse Name: N/A  . Number of Children: N/A  . Years of Education: N/A   Occupational History  . Not on file.   Social History Main Topics  . Smoking status: Former Smoker -- 2.00 packs/day for 20 years    Types: Cigarettes    Start date: 09/30/2011    Quit date: 10/30/2011  . Smokeless tobacco: Never Used  . Alcohol Use: No  . Drug Use: No  . Sexual Activity: Not Currently    Birth Control/ Protection: Post-menopausal   Other Topics Concern  . Not on file   Social History Narrative    Family History  Problem Relation Age of Onset  . Cancer Mother   . Depression Mother   . Heart disease Father     before age 37  . Diabetes Father   . Hypertension Father   . Hyperlipidemia Father   . Heart attack Father   . Cancer Maternal Grandmother     Allergies as of 03/29/2014 - Review Complete 03/29/2014  Allergen Reaction Noted  . Celebrex [celecoxib] Shortness Of Breath 03/05/2013  . Codeine Nausea Only     Current Outpatient Prescriptions on File Prior to Visit  Medication Sig Dispense Refill  . albuterol (PROVENTIL HFA;VENTOLIN HFA) 108 (90 BASE) MCG/ACT inhaler Inhale 1 puff into the lungs every 6 (six) hours as needed for wheezing or shortness of breath.    Marland Kitchen aspirin EC 81 MG tablet Take 1 tablet (81 mg total) by mouth daily.    Marland Kitchen atorvastatin (LIPITOR) 20 MG tablet Take 1 tablet (20 mg total) by mouth daily. 90 tablet 3  . BD PEN NEEDLE NANO U/F 32G X 4 MM MISC     . buPROPion (WELLBUTRIN XL) 300 MG 24 hr tablet Take 300 mg by mouth daily.    . CHANTIX STARTING MONTH PAK 0.5 MG X 11 & 1 MG X 42 tablet     . clindamycin (CLINDAGEL) 1 % gel Apply 1 application topically 2 (two) times daily.     . CVS STOOL SOFTENER 100 MG capsule Take 100 mg by mouth daily.   3  . cyclobenzaprine  (FLEXERIL) 10 MG tablet Take 10 mg by mouth 2 (two) times daily as needed for muscle spasms.     . DULoxetine (CYMBALTA) 60 MG capsule Take 60 mg by mouth daily.    . Ferrous Sulfate (SLOW FE PO) Take 1 tablet by mouth daily.    . insulin aspart (NOVOLOG) 100 UNIT/ML injection Inject 42 Units into the skin at bedtime.     . insulin detemir (LEVEMIR) 100 UNIT/ML injection Inject 90 Units into the skin daily.     Marland Kitchen  JANUVIA 50 MG tablet Take 50 mg by mouth daily.     Marland Kitchen lisinopril (PRINIVIL,ZESTRIL) 2.5 MG tablet Take 2.5 mg by mouth daily.     Marland Kitchen losartan (COZAAR) 50 MG tablet Take 50 mg by mouth daily.    . Multiple Vitamins-Minerals (MULTIVITAMIN WITH MINERALS) tablet Take 1 tablet by mouth daily.    Marland Kitchen omeprazole (PRILOSEC) 40 MG capsule Take 40 mg by mouth 2 (two) times daily.    Marland Kitchen OVER THE COUNTER MEDICATION Take 630 mg by mouth daily. OTC Calcium    . oxyCODONE-acetaminophen (PERCOCET/ROXICET) 5-325 MG per tablet Take 1 tablet by mouth every 6 (six) hours as needed (pain).   0  . oxyCODONE-acetaminophen (PERCOCET/ROXICET) 5-325 MG per tablet Take 1-2 tablets by mouth every 4 (four) hours as needed for moderate pain. (Patient not taking: Reported on 03/29/2014) 30 tablet 0  . prasugrel (EFFIENT) 10 MG TABS tablet Take 1 tablet (10 mg total) by mouth daily. 30 tablet 11  . promethazine (PHENERGAN) 25 MG tablet Take 25 mg by mouth every 6 (six) hours as needed for nausea or vomiting.     . topiramate (TOPAMAX) 50 MG tablet Take 1 tablet (50 mg total) by mouth daily. 30 tablet 3  . vitamin B-12 (CYANOCOBALAMIN) 500 MCG tablet Take 500 mcg by mouth daily.     No current facility-administered medications on file prior to visit.     REVIEW OF SYSTEMS: Cardiovascular: No chest pain, chest pressure, palpitations, orthopnea, or dyspnea on exertion.  Thigh cramping with activity Pulmonary: No productive cough, asthma or wheezing. Neurologic: No weakness, paresthesias, aphasia, or amaurosis. No  dizziness. Hematologic: No bleeding problems or clotting disorders. Musculoskeletal: No joint pain or joint swelling. Gastrointestinal: No blood in stool or hematemesis Genitourinary: No dysuria or hematuria. Psychiatric:: No history of major depression. Integumentary: Drainage right groin Constitutional: No fever or chills.  PHYSICAL EXAMINATION:   Vital signs are BP 122/49 mmHg  Pulse 65  Resp 16  Ht 5\' 7"  (1.702 m)  Wt 270 lb (122.471 kg)  BMI 42.28 kg/m2  SpO2 98% General: The patient appears their stated age. HEENT:  No gross abnormalities Pulmonary:  Non labored breathing Abdomen: Soft and non-tender Musculoskeletal: There are no major deformities. Neurologic: No focal weakness or paresthesias are detected, Skin: Slight opening his right groin however incisions are basically healed. Psychiatric: The patient has normal affect. Cardiovascular: There is a regular rate and rhythm without significant murmur appreciated.   Diagnostic Studies Duplex was reviewed today.  Elevated velocities are noted at the origin of the right to left femoral-femoral bypass graft with peak velocity of 462.  In addition within the right superficial femoral artery stent peak velocity is 467.  Velocity is 294 in the proximal left superficial femoral artery  Assessment: Bilateral claudication Plan: After muscular liberation, I have elected to proceed with angiography via the left brachial approach to evaluate the aorta, femoral-femoral bypass graft and bilateral runoff.  Potentially, she will need intervention at the proximal right superficial femoral artery stent.  I may also have to get safe second access of her femoral-femoral graft if there is a stenosis at its origin.  Will also consider treating the left superficial femoral artery if it is significant.  This is been scheduled for Tuesday, March 15.  The patient will need to be off of her Effient for 3 days  V. Leia Alf, M.D. [facility] [place] Office: [phone] Pager:  [phone]   2

## 2014-03-29 NOTE — Addendum Note (Signed)
Addended by: Mena Goes on: 03/29/2014 04:41 PM   Modules accepted: Orders

## 2014-04-06 DIAGNOSIS — Z1211 Encounter for screening for malignant neoplasm of colon: Secondary | ICD-10-CM | POA: Diagnosis not present

## 2014-04-06 DIAGNOSIS — K219 Gastro-esophageal reflux disease without esophagitis: Secondary | ICD-10-CM | POA: Diagnosis not present

## 2014-04-06 DIAGNOSIS — R079 Chest pain, unspecified: Secondary | ICD-10-CM | POA: Diagnosis not present

## 2014-04-07 ENCOUNTER — Encounter: Payer: Self-pay | Admitting: Cardiology

## 2014-04-07 ENCOUNTER — Encounter: Payer: Self-pay | Admitting: Nurse Practitioner

## 2014-04-07 ENCOUNTER — Ambulatory Visit (INDEPENDENT_AMBULATORY_CARE_PROVIDER_SITE_OTHER): Payer: Medicare Other | Admitting: Cardiology

## 2014-04-07 VITALS — BP 148/78 | HR 95 | Ht 67.0 in | Wt 284.0 lb

## 2014-04-07 DIAGNOSIS — I10 Essential (primary) hypertension: Secondary | ICD-10-CM

## 2014-04-07 DIAGNOSIS — Z0279 Encounter for issue of other medical certificate: Secondary | ICD-10-CM

## 2014-04-07 DIAGNOSIS — I200 Unstable angina: Secondary | ICD-10-CM | POA: Insufficient documentation

## 2014-04-07 NOTE — Progress Notes (Signed)
Cardiology Office Note   Date:  04/07/2014   ID:  Stephanie Peck, DOB [birth year], MRN [id number]  PCP:  Jonathon Bellows, MD  Cardiologist:   Candee Furbish, MD   Chest pain    History of Present Illness: Stephanie Peck is a 54 y.o. female who presents for chest pain. She has a history of peripheral vascular disease, obesity, mild COPD, sleep apnea, diastolic dysfunction with prior left femoral endarterectomy, right lower extremity stent with nuclear stress test that was previously low risk 01/03/12, no ischemia. No prior cardiac catheterization.  Has had chest discomfort for years, GERD. Happening frequently. H2 blocker, TUMS on top of PPI. Feels like a wham to the chest down to arm and up to jaw. Came on without exertion. A few times concerned. When lay down it is worse. Can last 35min to 1hr. Takes pain pill. Sometimes feels it like a small pressure with walking. Not as severe when walking.   No prior cath.   Her father had bypass surgery at age 85.  She continues to worry about the symptoms.  Next Tuesday she is having lower externally angiogram by Dr. Trula Slade.      Past Medical History  Diagnosis Date  . GERD (gastroesophageal reflux disease)   . Anxiety   . CHF (congestive heart failure)   . COPD (chronic obstructive pulmonary disease)   . Depression   . Morbid obesity   . Hypertension   . Asthma   . On home oxygen therapy     "2L q hs" (12/19/2013)  . Chronic bronchitis     "get it pretty much q yr"  . OSA on CPAP     severe OSA by 09/08/06 sleep study ([facility])  . Type II diabetes mellitus   . Peripheral vascular disease     Past Surgical History  Procedure Laterality Date  . Cholecystectomy    . Roux-en-y gastric bypass  2006  . Tonsillectomy    . Femoral artery stent Right 12/05/2011    superficial   . Endarterectomy femoral  02/22/2012    Procedure: ENDARTERECTOMY FEMORAL;  Surgeon: Serafina Mitchell, MD;  Location: [place];  Service: Vascular;  Laterality:  Left;  . Patch angioplasty  02/22/2012    Procedure: PATCH ANGIOPLASTY;  Surgeon: Serafina Mitchell, MD;  Location: [facility] OR;  Service: Vascular;  Laterality: Left;  left femoral patch angioplasty  . Application of wound vac  02/22/2012    Procedure: APPLICATION OF WOUND VAC;  Surgeon: Serafina Mitchell, MD;  Location: [place];  Service: Vascular;  Laterality: Left;  application wound vac  . Groin debridement  03/01/2012    Procedure: GROIN DEBRIDEMENT;  Surgeon: Rosetta Posner, MD;  Location: [place];  Service: Vascular;  Laterality: Left;  . Aortogram Left 04/04/2012    Procedure: AORTOGRAM;  Surgeon: Serafina Mitchell, MD;  Location: [place];  Service: Vascular;  Laterality: Left;  . Patch angioplasty Left 04/04/2012    Procedure: PATCH ANGIOPLASTY;  Surgeon: Serafina Mitchell, MD;  Location: [place];  Service: Vascular;  Laterality: Left;  Marland Kitchen Muscle flap closure Left 04/04/2012    Procedure: MUSCLE FLAP CLOSURE;  Surgeon: Theodoro Kos, DO;  Location: [place];  Service: Plastics;  Laterality: Left;  . Irrigation and debridement abscess Left 03/05/2013    Procedure: IRRIGATION AND DEBRIDEMENT ABSCESS;  Surgeon: Zenovia Jarred, MD;  Location: [place];  Service: General;  Laterality: Left;  . Breast biopsy Right ~ 2005  .  Femoral artery - femoral artery bypass graft  12/17/2013  . Femoral artery stent Right 12/17/2013  . Femoral-femoral bypass graft N/A 12/17/2013    Procedure: BYPASS GRAFT FEMORAL-FEMORAL ARTERY-RIGHT TO LEFT;  Surgeon: Serafina Mitchell, MD;  Location: [place];  Service: Vascular;  Laterality: N/A;  . Insertion of iliac stent Right 12/17/2013    Procedure:  Insertion of Stent Right Superficial Femoral Artery ;  Surgeon: Serafina Mitchell, MD;  Location: [place];  Service: Vascular;  Laterality: Right;  . Lower extremity angiogram Bilateral 12/05/2011    Procedure: LOWER EXTREMITY ANGIOGRAM;  Surgeon: Serafina Mitchell, MD;  Location: [facility] CATH LAB;  Service: Cardiovascular;  Laterality: Bilateral;  . Abdominal aortagram  N/A 12/09/2012    Procedure: ABDOMINAL AORTAGRAM;  Surgeon: Serafina Mitchell, MD;  Location: [facility] CATH LAB;  Service: Cardiovascular;  Laterality: N/A;  . Abdominal aortagram N/A 11/24/2013    Procedure: ABDOMINAL AORTAGRAM;  Surgeon: Serafina Mitchell, MD;  Location: [facility] CATH LAB;  Service: Cardiovascular;  Laterality: N/A;     Current Outpatient Prescriptions  Medication Sig Dispense Refill  . albuterol (PROVENTIL HFA;VENTOLIN HFA) 108 (90 BASE) MCG/ACT inhaler Inhale 1 puff into the lungs every 6 (six) hours as needed for wheezing or shortness of breath.    Mar[place] Kitchen aspirin EC 81 MG tablet Take 1 tablet (81 mg total) by mouth daily.    Marland Kitchen atorvastatin (LIPITOR) 20 MG tablet Take 1 tablet (20 mg total) by mouth daily. 90 tablet 3  . BD PEN NEEDLE NANO U/F 32G X 4 MM MISC     . buPROPion (WELLBUTRIN XL) 300 MG 24 hr tablet Take 300 mg by mouth daily.    . clindamycin (CLINDAGEL) 1 % gel Apply 1 application topically 2 (two) times daily.     . [employer] STOOL SOFTENER 100 MG capsule Take 100 mg by mouth daily.   3  . cyclobenzaprine (FLEXERIL) 10 MG tablet Take 10 mg by mouth 2 (two) times daily as needed for muscle spasms.     . DULoxetine (CYMBALTA) 60 MG capsule Take 60 mg by mouth daily.    . Ferrous Sulfate (SLOW FE PO) Take 1 tablet by mouth daily.    . insulin aspart (NOVOLOG) 100 UNIT/ML injection Inject 42 Units into the skin at bedtime.     . insulin detemir (LEVEMIR) 100 UNIT/ML injection Inject 90 Units into the skin daily.     Marland Kitchen JANUVIA 50 MG tablet Take 50 mg by mouth daily.     Marland Kitchen losartan (COZAAR) 50 MG tablet Take 50 mg by mouth daily.    . Multiple Vitamins-Minerals (MULTIVITAMIN WITH MINERALS) tablet Take 1 tablet by mouth daily.    Marland Kitchen omeprazole (PRILOSEC) 40 MG capsule Take 40 mg by mouth 2 (two) times daily.    Marland Kitchen OVER THE COUNTER MEDICATION Take 630 mg by mouth daily. OTC Calcium    . oxyCODONE-acetaminophen (PERCOCET/ROXICET) 5-325 MG per tablet Take 1 tablet by mouth every 6 (six) hours  as needed (pain).   0  . prasugrel (EFFIENT) 10 MG TABS tablet Take 1 tablet (10 mg total) by mouth daily. 30 tablet 11  . promethazine (PHENERGAN) 25 MG tablet Take 25 mg by mouth every 6 (six) hours as needed for nausea or vomiting.     . topiramate (TOPAMAX) 50 MG tablet Take 1 tablet (50 mg total) by mouth daily. 30 tablet 3  . vitamin B-12 (CYANOCOBALAMIN) 500 MCG tablet Take 500 mcg by mouth daily.  No current facility-administered medications for this visit.    Allergies:   Celebrex and Codeine    Social History:  The patient  reports that she quit smoking about 2 years ago. Her smoking use included Cigarettes. She started smoking about 2 years ago. She has a 40 pack-year smoking history. She has never used smokeless tobacco. She reports that she does not drink alcohol or use illicit drugs.   Family History:  The patient's family history includes Cancer in her maternal grandmother and mother; Depression in her mother; Diabetes in her father; Heart attack in her father; Heart disease in her father; Hyperlipidemia in her father; Hypertension in her father. Father 50 CABG   ROS:  Please see the history of present illness.   Otherwise, review of systems are positive for none.   All other systems are reviewed and negative.    PHYSICAL EXAM: VS:  BP 148/78 mmHg  Pulse 95  Ht 5\' 7"  (1.702 m)  Wt 284 lb (128.822 kg)  BMI 44.47 kg/m2 , BMI Body mass index is 44.47 kg/(m^2). GEN: Well nourished, well developed, in no acute distress HEENT: normal Neck: no JVD, carotid bruits, or masses Cardiac: RRR; no murmurs, rubs, or gallops,no edema  Respiratory:  clear to auscultation bilaterally, normal work of breathing GI: soft, nontender,distended, + BS, obese MS: no deformity or atrophy Skin: warm and dry, no rash, varicose veins noted left greater than right Neuro:  Strength and sensation are intact Psych: euthymic mood, full affect   EKG:  EKG is ordered today. The ekg ordered today  demonstrates sinus rhythm heart rate 95, poor R-wave progression   Recent Labs: 12/11/2013: ALT 28 12/21/2013: BUN 19; Creatinine 1.25*; Hemoglobin 10.2*; Platelets 370; Potassium 4.9; Sodium 139    Lipid Panel No results found for: CHOL, TRIG, HDL, CHOLHDL, VLDL, LDLCALC, LDLDIRECT    Wt Readings from Last 3 Encounters:  04/07/14 284 lb (128.822 kg)  03/29/14 270 lb (122.471 kg)  02/01/14 272 lb (123.378 kg)       ASSESSMENT AND PLAN:  1.  Worsening angina-I will go ahead and perform diagnostic coronary angiography via the right radial artery approach. Risks and benefits explained including stroke, heart attack, death, renal impairment. She has had a nuclear stress test in 2013 however I feel that her symptoms are progressing has typical and atypical features. Nuclear stress test would be decreased sensitivity because of body habitus. I want to go ahead and get her peripheral angiogram done first which is already been scheduled. I will allow her to heal and then schedule on March 31.  2. Morbid obesity-encourage weight loss  3. GERD-symptoms are challenging, possible GI although atypical, could be musculoskeletal, Dr. Paulita Fujita sees her.  4. Essential hypertension - stopping lisinopril 2.5 mg. Continue losartan 50.   Current medicines are reviewed at length with the patient today.  The patient does not have concerns regarding medicines.    The following changes have been made:  no change  Labs/ tests ordered today include: Cardiac cath  Orders Placed This Encounter  Procedures  . CBC w/Diff  . INR/PT  . Basic metabolic panel  . EKG 12-Lead     Disposition:   FU with 2 weeks post cath  Bobby Rumpf, MD  04/07/2014 5:28 PM    [facility] [address], [place], [place]  [address] Phone: [phone]; Fax: [phone]

## 2014-04-07 NOTE — Patient Instructions (Addendum)
Your physician has requested that you have a cardiac catheterization. Cardiac catheterization is used to diagnose and/or treat various heart conditions. Doctors may recommend this procedure for a number of different reasons. The most common reason is to evaluate chest pain. Chest pain can be a symptom of coronary artery disease (CAD), and cardiac catheterization can show whether plaque is narrowing or blocking your heart's arteries. This procedure is also used to evaluate the valves, as well as measure the blood flow and oxygen levels in different parts of your heart. For further information please visit [email or web address]. Please follow instruction sheet, as given.  Your physician has recommended you make the following change in your medication:  STOP Lisinopril  Your physician recommends that you return for lab work on Tuesday March 29 for pre-cath labs - you do not have to fast - come anytime between 7:30 am and 5:00 pm  Your physician recommends that you schedule a follow-up appointment in: 5 weeks with Dr. Marlou Porch

## 2014-04-09 DIAGNOSIS — D631 Anemia in chronic kidney disease: Secondary | ICD-10-CM | POA: Diagnosis not present

## 2014-04-09 DIAGNOSIS — N182 Chronic kidney disease, stage 2 (mild): Secondary | ICD-10-CM | POA: Diagnosis not present

## 2014-04-09 DIAGNOSIS — I129 Hypertensive chronic kidney disease with stage 1 through stage 4 chronic kidney disease, or unspecified chronic kidney disease: Secondary | ICD-10-CM | POA: Diagnosis not present

## 2014-04-09 DIAGNOSIS — R635 Abnormal weight gain: Secondary | ICD-10-CM | POA: Diagnosis not present

## 2014-04-13 MED ORDER — SODIUM CHLORIDE 0.9 % IV SOLN
INTRAVENOUS | Status: DC
Start: 2014-04-13 — End: 2014-04-14
  Administered 2014-04-14: 1000 mL via INTRAVENOUS

## 2014-04-14 ENCOUNTER — Encounter (HOSPITAL_COMMUNITY)
Admission: RE | Disposition: A | Discharge status: Home / Self Care | Payer: Self-pay | Source: Ambulatory Visit | Attending: Surgery

## 2014-04-14 ENCOUNTER — Other Ambulatory Visit: Payer: Self-pay | Admitting: *Deleted

## 2014-04-14 ENCOUNTER — Ambulatory Visit (HOSPITAL_COMMUNITY)
Admission: RE | Admit: 2014-04-14 | Discharge: 2014-04-14 | Disposition: A | Discharge status: Home / Self Care | Payer: Medicare Other | Source: Ambulatory Visit | Attending: Surgery | Admitting: Surgery

## 2014-04-14 ENCOUNTER — Encounter (HOSPITAL_COMMUNITY): Payer: Self-pay | Admitting: Surgery

## 2014-04-14 DIAGNOSIS — I509 Heart failure, unspecified: Secondary | ICD-10-CM | POA: Insufficient documentation

## 2014-04-14 DIAGNOSIS — Z6841 Body Mass Index (BMI) 40.0 and over, adult: Secondary | ICD-10-CM | POA: Diagnosis not present

## 2014-04-14 DIAGNOSIS — J45909 Unspecified asthma, uncomplicated: Secondary | ICD-10-CM | POA: Insufficient documentation

## 2014-04-14 DIAGNOSIS — Z87891 Personal history of nicotine dependence: Secondary | ICD-10-CM | POA: Diagnosis not present

## 2014-04-14 DIAGNOSIS — Z9889 Other specified postprocedural states: Secondary | ICD-10-CM | POA: Diagnosis not present

## 2014-04-14 DIAGNOSIS — Z9989 Dependence on other enabling machines and devices: Secondary | ICD-10-CM | POA: Insufficient documentation

## 2014-04-14 DIAGNOSIS — E119 Type 2 diabetes mellitus without complications: Secondary | ICD-10-CM | POA: Diagnosis not present

## 2014-04-14 DIAGNOSIS — I10 Essential (primary) hypertension: Secondary | ICD-10-CM | POA: Insufficient documentation

## 2014-04-14 DIAGNOSIS — Z9862 Peripheral vascular angioplasty status: Secondary | ICD-10-CM

## 2014-04-14 DIAGNOSIS — Z79891 Long term (current) use of opiate analgesic: Secondary | ICD-10-CM | POA: Insufficient documentation

## 2014-04-14 DIAGNOSIS — G4733 Obstructive sleep apnea (adult) (pediatric): Secondary | ICD-10-CM | POA: Diagnosis not present

## 2014-04-14 DIAGNOSIS — K219 Gastro-esophageal reflux disease without esophagitis: Secondary | ICD-10-CM | POA: Insufficient documentation

## 2014-04-14 DIAGNOSIS — Z794 Long term (current) use of insulin: Secondary | ICD-10-CM | POA: Diagnosis not present

## 2014-04-14 DIAGNOSIS — F329 Major depressive disorder, single episode, unspecified: Secondary | ICD-10-CM | POA: Insufficient documentation

## 2014-04-14 DIAGNOSIS — F419 Anxiety disorder, unspecified: Secondary | ICD-10-CM | POA: Diagnosis not present

## 2014-04-14 DIAGNOSIS — Z7982 Long term (current) use of aspirin: Secondary | ICD-10-CM | POA: Diagnosis not present

## 2014-04-14 DIAGNOSIS — I70213 Atherosclerosis of native arteries of extremities with intermittent claudication, bilateral legs: Secondary | ICD-10-CM | POA: Diagnosis not present

## 2014-04-14 DIAGNOSIS — Z9981 Dependence on supplemental oxygen: Secondary | ICD-10-CM | POA: Insufficient documentation

## 2014-04-14 DIAGNOSIS — J449 Chronic obstructive pulmonary disease, unspecified: Secondary | ICD-10-CM | POA: Insufficient documentation

## 2014-04-14 DIAGNOSIS — Z79899 Other long term (current) drug therapy: Secondary | ICD-10-CM | POA: Insufficient documentation

## 2014-04-14 DIAGNOSIS — I739 Peripheral vascular disease, unspecified: Secondary | ICD-10-CM | POA: Diagnosis present

## 2014-04-14 HISTORY — PX: ABDOMINAL AORTAGRAM: SHX5454

## 2014-04-14 LAB — POCT I-STAT, CHEM 8
BUN: 18 mg/dL (ref 6–23)
Calcium, Ion: 1.15 mmol/L (ref 1.12–1.23)
Chloride: 104 mmol/L (ref 96–112)
Creatinine, Ser: 1.2 mg/dL — ABNORMAL HIGH (ref 0.50–1.10)
Glucose, Bld: 288 mg/dL — ABNORMAL HIGH (ref 70–99)
HCT: 44 % (ref 36.0–46.0)
HEMOGLOBIN: 15 g/dL (ref 12.0–15.0)
POTASSIUM: 4.2 mmol/L (ref 3.5–5.1)
Sodium: 139 mmol/L (ref 135–145)
TCO2: 18 mmol/L (ref 0–100)

## 2014-04-14 LAB — GLUCOSE, CAPILLARY
GLUCOSE-CAPILLARY: 272 mg/dL — AB (ref 70–99)
Glucose-Capillary: 243 mg/dL — ABNORMAL HIGH (ref 70–99)

## 2014-04-14 LAB — POCT ACTIVATED CLOTTING TIME
Activated Clotting Time: 202 seconds
Activated Clotting Time: 153 seconds

## 2014-04-14 SURGERY — ABDOMINAL AORTAGRAM
Laterality: Right

## 2014-04-14 MED ORDER — FENTANYL CITRATE 0.05 MG/ML IJ SOLN
INTRAMUSCULAR | Status: AC
  Filled 2014-04-14: qty 2

## 2014-04-14 MED ORDER — HEPARIN (PORCINE) IN NACL 2-0.9 UNIT/ML-% IJ SOLN
INTRAMUSCULAR | Status: AC
  Filled 2014-04-14: qty 1000

## 2014-04-14 MED ORDER — ALUM & MAG HYDROXIDE-SIMETH 200-200-20 MG/5ML PO SUSP: 15.0000 mL | ORAL | Status: DC | PRN

## 2014-04-14 MED ORDER — HYDRALAZINE HCL 20 MG/ML IJ SOLN: 5.0000 mg | INTRAMUSCULAR | Status: DC | PRN

## 2014-04-14 MED ORDER — ACETAMINOPHEN 325 MG RE SUPP: 325.0000 mg | RECTAL | Status: DC | PRN

## 2014-04-14 MED ORDER — HEPARIN SODIUM (PORCINE) 1000 UNIT/ML IJ SOLN
INTRAMUSCULAR | Status: AC
  Filled 2014-04-14: qty 1

## 2014-04-14 MED ORDER — LIDOCAINE HCL (PF) 1 % IJ SOLN
INTRAMUSCULAR | Status: AC
  Filled 2014-04-14: qty 30

## 2014-04-14 MED ORDER — GUAIFENESIN-DM 100-10 MG/5ML PO SYRP: 15.0000 mL | ORAL_SOLUTION | ORAL | Status: DC | PRN

## 2014-04-14 MED ORDER — DOCUSATE SODIUM 100 MG PO CAPS: 100.0000 mg | ORAL_CAPSULE | Freq: Every day | ORAL | Status: DC

## 2014-04-14 MED ORDER — NITROGLYCERIN 1 MG/10 ML FOR IR/CATH LAB
INTRA_ARTERIAL | Status: AC
Start: 2014-04-14 — End: 2014-04-14
  Filled 2014-04-14: qty 10

## 2014-04-14 MED ORDER — MIDAZOLAM HCL 2 MG/2ML IJ SOLN
INTRAMUSCULAR | Status: AC
  Filled 2014-04-14: qty 2

## 2014-04-14 MED ORDER — SODIUM CHLORIDE 0.9 % IV SOLN: 1.0000 mL/kg/h | INTRAVENOUS | Status: DC

## 2014-04-14 MED ORDER — ACETAMINOPHEN 325 MG PO TABS: 325.0000 mg | ORAL_TABLET | ORAL | Status: DC | PRN

## 2014-04-14 MED ORDER — METOPROLOL TARTRATE 1 MG/ML IV SOLN: 2.0000 mg | INTRAVENOUS | Status: DC | PRN

## 2014-04-14 MED ORDER — PRASUGREL HCL 10 MG PO TABS
ORAL_TABLET | ORAL | Status: AC
  Filled 2014-04-14: qty 6

## 2014-04-14 MED ORDER — PHENOL 1.4 % MT LIQD: 1.0000 | OROMUCOSAL | Status: DC | PRN

## 2014-04-14 MED ORDER — LABETALOL HCL 5 MG/ML IV SOLN: 10.0000 mg | INTRAVENOUS | Status: DC | PRN

## 2014-04-14 MED ORDER — ONDANSETRON HCL 4 MG/2ML IJ SOLN: 4.0000 mg | Freq: Four times a day (QID) | INTRAMUSCULAR | Status: DC | PRN

## 2014-04-14 SURGICAL SUPPLY — 55 items
ADH SKN CLS APL DERMABOND .7 (GAUZE/BANDAGES/DRESSINGS) ×3
BANDAGE ELASTIC 4 VELCRO ST LF (GAUZE/BANDAGES/DRESSINGS) IMPLANT
BANDAGE ESMARK 6X9 LF (GAUZE/BANDAGES/DRESSINGS) IMPLANT
BNDG CMPR 9X6 STRL LF SNTH (GAUZE/BANDAGES/DRESSINGS)
BNDG ESMARK 6X9 LF (GAUZE/BANDAGES/DRESSINGS)
CANISTER SUCTION 2500CC (MISCELLANEOUS) ×5 IMPLANT
CLIP TI MEDIUM 24 (CLIP) ×5 IMPLANT
CLIP TI WIDE RED SMALL 24 (CLIP) ×5 IMPLANT
COVER SURGICAL LIGHT HANDLE (MISCELLANEOUS) ×5 IMPLANT
CUFF TOURNIQUET SINGLE 24IN (TOURNIQUET CUFF) IMPLANT
CUFF TOURNIQUET SINGLE 34IN LL (TOURNIQUET CUFF) IMPLANT
CUFF TOURNIQUET SINGLE 44IN (TOURNIQUET CUFF) IMPLANT
DERMABOND ADVANCED (GAUZE/BANDAGES/DRESSINGS) ×2
DERMABOND ADVANCED .7 DNX12 (GAUZE/BANDAGES/DRESSINGS) ×3 IMPLANT
DRAIN CHANNEL 15F RND FF W/TCR (WOUND CARE) IMPLANT
DRAPE WARM FLUID 44X44 (DRAPE) ×5 IMPLANT
DRAPE X-RAY CASS 24X20 (DRAPES) IMPLANT
DRSG COVADERM 4X10 (GAUZE/BANDAGES/DRESSINGS) IMPLANT
DRSG COVADERM 4X8 (GAUZE/BANDAGES/DRESSINGS) IMPLANT
ELECT REM PT RETURN 9FT ADLT (ELECTROSURGICAL) ×5
ELECTRODE REM PT RTRN 9FT ADLT (ELECTROSURGICAL) ×3 IMPLANT
EVACUATOR SILICONE 100CC (DRAIN) IMPLANT
GLOVE BIOGEL PI IND STRL 7.5 (GLOVE) ×3 IMPLANT
GLOVE BIOGEL PI INDICATOR 7.5 (GLOVE) ×2
GLOVE SURG SS PI 7.5 STRL IVOR (GLOVE) ×5 IMPLANT
GOWN PREVENTION PLUS XXLARGE (GOWN DISPOSABLE) ×5 IMPLANT
GOWN STRL NON-REIN LRG LVL3 (GOWN DISPOSABLE) ×15 IMPLANT
HEMOSTAT SNOW SURGICEL 2X4 (HEMOSTASIS) IMPLANT
KIT BASIN OR (CUSTOM PROCEDURE TRAY) ×5 IMPLANT
KIT ROOM TURNOVER OR (KITS) ×5 IMPLANT
MARKER GRAFT CORONARY BYPASS (MISCELLANEOUS) IMPLANT
NS IRRIG 1000ML POUR BTL (IV SOLUTION) ×10 IMPLANT
PACK PERIPHERAL VASCULAR (CUSTOM PROCEDURE TRAY) ×5 IMPLANT
PAD ARMBOARD 7.5X6 YLW CONV (MISCELLANEOUS) ×10 IMPLANT
PADDING CAST COTTON 6X4 STRL (CAST SUPPLIES) IMPLANT
SET COLLECT BLD 21X3/4 12 (NEEDLE) IMPLANT
STOPCOCK 4 WAY LG BORE MALE ST (IV SETS) IMPLANT
SUT ETHILON 3 0 PS 1 (SUTURE) IMPLANT
SUT PROLENE 5 0 C 1 24 (SUTURE) ×5 IMPLANT
SUT PROLENE 6 0 BV (SUTURE) ×5 IMPLANT
SUT PROLENE 7 0 BV 1 (SUTURE) IMPLANT
SUT SILK 2 0 SH (SUTURE) ×5 IMPLANT
SUT SILK 3 0 (SUTURE)
SUT SILK 3-0 18XBRD TIE 12 (SUTURE) IMPLANT
SUT VIC AB 2-0 CT1 27 (SUTURE) ×10
SUT VIC AB 2-0 CT1 TAPERPNT 27 (SUTURE) ×6 IMPLANT
SUT VIC AB 3-0 SH 27 (SUTURE) ×10
SUT VIC AB 3-0 SH 27X BRD (SUTURE) ×6 IMPLANT
SUT VICRYL 4-0 PS2 18IN ABS (SUTURE) ×10 IMPLANT
TOWEL OR 17X24 6PK STRL BLUE (TOWEL DISPOSABLE) ×10 IMPLANT
TOWEL OR 17X26 10 PK STRL BLUE (TOWEL DISPOSABLE) ×10 IMPLANT
TRAY FOLEY CATH 16FRSI W/METER (SET/KITS/TRAYS/PACK) ×5 IMPLANT
TUBING EXTENTION W/L.L. (IV SETS) IMPLANT
UNDERPAD 30X30 INCONTINENT (UNDERPADS AND DIAPERS) ×5 IMPLANT
WATER STERILE IRR 1000ML POUR (IV SOLUTION) ×5 IMPLANT

## 2014-04-14 NOTE — Op Note (Signed)
Patient name: Stephanie Peck MRN: [id number] DOB: Oct 24, 1960 Sex: female  04/14/2014 Pre-operative Diagnosis: Recurrent claudication Post-operative diagnosis:  Same Surgeon:  Eldridge Abrahams Procedure Performed:  1.  Ultrasound-guided access, left brachial artery  2.  Abdominal aortogram  3.  Bilateral lower extremity runoff  4.  Drug coated balloon angioplasty, right superficial femoral artery  5.  Third order catheterization (femoral-femoral bypass graft)     Indications:  The patient has undergone multiple revascularizations in the past.  She has a right to left femoral-femoral bypass graft and stents within her right superficial femoral artery and a history of an angioplasty to the left superficial femoral artery.  Ultrasound identified several areas of stenosis.  She is here for further evaluation and possible treatment.  Findings: 80% stenosis just proximal to the right superficial femoral artery stent.  This was treated with Angiosculpt 5 x 40 balloon followed by a Lutonix 5 x 40 drug coated balloon With residual stenosis less than 10%   Procedure:  The patient was identified in the holding area and taken to room 8.  The patient was then placed supine on the table and prepped and draped in the usual sterile fashion.  A time out was called.  Ultrasound was used to evaluate the left brachial artery was widely patent.  A digital ultrasound image was acquired.  After 1% lidocaine was utilized, the left brachial artery was accessed under ultrasound guidance with a micropuncture needle.  A 018 wire was advanced without resistance and a micropuncture sheath was placed.  Next, a woolly wire was inserted followed by a 6 [place] sheath.  400 nitroglycerin and 2000 units of heparin were administered through the sheath.  Next a pigtail catheter was advanced to the distal aorta and a distal abdominal aortogram was performed.  I then utilized a long headhunter catheter and selected the right to  left femoral-femoral bypass graft and left lower sternum a runoff was performed.  Next the catheter was advanced into the right superficial femoral artery and right lower extremity runoff was performed.   Findings:   Aortogram:  No stenosis is identified within the visualized portions of the distal abdominal aorta.  The right iliac system is widely patent.  The left is occluded.  There is a right to left femoral-femoral bypass graft which is widely patent.  There is no evidence of anastomotic stenosis, as suggested by ultrasound.    Right Lower Extremity:  The bypass graft originates from the right superficial femoral artery.  The profunda is widely patent.  The stent within the right superficial femoral artery is widely patent, however there is an 80% stenosis just proximal to the stent.  The stent visualized in the adductor canal is widely patent.  There is three-vessel runoff to the ankle.    Left Lower Extremity:  The anastomosis of the femoral-femoral bypass graft is widely patent.  The profunda femoral artery is widely patent.  The superficial femoral artery is patent throughout its course in the midportion there is approximately 50% stenosis.  The popliteal artery is widely patent there is three-vessel runoff   Intervention:  After the above images were acquired, the decision was made to proceed with intervention.  Over a 035 wire, a 6 French 90 cm sheath was inserted into the right iliac system.  I then advanced a 014 [place] core wire into the right popliteal artery.  The patient was fully heparinized.  I elected to treat the 80% stenosis just proximal  to the stent using a  Angiosculpt 5 x 40 balloon.  This was taken to profile for 2 minutes.  Completion imaging was performed which showed resolution of the stenosis.  I then inserted a Lutonix 5x40 drug coated balloon.  Transit time was 12 seconds.  The balloon was taken to profile which correlated to 4 atm and held up for 3 minutes.  Completion imaging  showed resolution of the stenosis.  Catheters and wires were removed.  The long sheath was exchanged out for a short 6 [place] sheath.  There were no immediate complications  Impression:  #1  .  Right to left femoral-femoral bypass graft is widely patent with no evidence of anastomotic stenosis.  Elevated velocities visualized on ultrasound are likely secondary to angulation.  #2   approximate 50% stenosis within the left superficial femoral artery.  This correlates to an area that has never been intervened on in the past, and therefore no intervention was performed.  #3   approximate 80% stenosis within the native right superficial femoral artery, just proximal to the stent in the SFA.  I treated this with a 5 x 40 Angiosculpt, followed by a Lutonix 5 x 40 drug coated balloon with resolution of the stenosis to less than 10%.    Theotis Burrow, M.D. [facility] Office: [phone] Pager:  [phone]

## 2014-04-14 NOTE — Progress Notes (Signed)
6 french sheath removed from left brachial artery and pressure held x 25 minutes.  Vitals stable throughout sheath removal.  Site dressed with tegaderm and 4x4 gauze.  Site level 0.  Armboard applied to arm to keep from bending arm.  Post instructions given.  --Alison Murray

## 2014-04-14 NOTE — Progress Notes (Signed)
ACT 152. Sheath left brachial being removed by Alison Murray cath lab supervisor. Vitals monitor every 5 min.

## 2014-04-14 NOTE — H&P (View-Only) (Signed)
Patient name: Stephanie Peck MRN: [id number] DOB: 1960-08-26 Sex: female     Chief Complaint  Patient presents with  . Re-evaluation    S/P Right Fem-Fem BPG  12-17-13      HISTORY OF PRESENT ILLNESS: The patient is here today for followup. On 04/07/2012 she underwent redo left femoral endarterectomy with patch angioplasty. I previously performed a left femoral endarterectomy and this has occluded. She came in with numbness to her left foot  The patient is back today for follow-up. On 12/17/2013 she underwent a right to left femoral-femoral bypass graft and stenting of her right superficial femoral artery. She presented postoperatively to the office with drainage from her right sided incision. She has a history of a muscle flap by Dr. Migdalia Dk on the left .  She reports having difficulty with walking approximately 100 feet which is slightly worse than previous    Past Medical History  Diagnosis Date  . GERD (gastroesophageal reflux disease)   . Anxiety   . CHF (congestive heart failure)   . COPD (chronic obstructive pulmonary disease)   . Depression   . Morbid obesity   . Hypertension   . Asthma   . On home oxygen therapy     "2L q hs" (12/19/2013)  . Chronic bronchitis     "get it pretty much q yr"  . OSA on CPAP     severe OSA by 09/08/06 sleep study ([facility])  . Type II diabetes mellitus   . Peripheral vascular disease     Past Surgical History  Procedure Laterality Date  . Cholecystectomy    . Roux-en-y gastric bypass  2006  . Tonsillectomy    . Femoral artery stent Right 12/05/2011    superficial   . Endarterectomy femoral  02/22/2012    Procedure: ENDARTERECTOMY FEMORAL;  Surgeon: Serafina Mitchell, MD;  Location: [place];  Service: Vascular;  Laterality: Left;  . Patch angioplasty  02/22/2012    Procedure: PATCH ANGIOPLASTY;  Surgeon: Serafina Mitchell, MD;  Location: [facility] OR;  Service: Vascular;  Laterality: Left;  left femoral patch angioplasty  . Application of  wound vac  02/22/2012    Procedure: APPLICATION OF WOUND VAC;  Surgeon: Serafina Mitchell, MD;  Location: [place];  Service: Vascular;  Laterality: Left;  application wound vac  . Groin debridement  03/01/2012    Procedure: GROIN DEBRIDEMENT;  Surgeon: Rosetta Posner, MD;  Location: [place];  Service: Vascular;  Laterality: Left;  . Aortogram Left 04/04/2012    Procedure: AORTOGRAM;  Surgeon: Serafina Mitchell, MD;  Location: [place];  Service: Vascular;  Laterality: Left;  . Patch angioplasty Left 04/04/2012    Procedure: PATCH ANGIOPLASTY;  Surgeon: Serafina Mitchell, MD;  Location: [place];  Service: Vascular;  Laterality: Left;  Marland Kitchen Muscle flap closure Left 04/04/2012    Procedure: MUSCLE FLAP CLOSURE;  Surgeon: Theodoro Kos, DO;  Location: [place];  Service: Plastics;  Laterality: Left;  . Irrigation and debridement abscess Left 03/05/2013    Procedure: IRRIGATION AND DEBRIDEMENT ABSCESS;  Surgeon: Zenovia Jarred, MD;  Location: [place];  Service: General;  Laterality: Left;  . Breast biopsy Right ~ 2005  . Femoral artery - femoral artery bypass graft  12/17/2013  . Femoral artery stent Right 12/17/2013  . Femoral-femoral bypass graft N/A 12/17/2013    Procedure: BYPASS GRAFT FEMORAL-FEMORAL ARTERY-RIGHT TO LEFT;  Surgeon: Serafina Mitchell, MD;  Location: [place];  Service: Vascular;  Laterality:  N/A;  . Insertion of iliac stent Right 12/17/2013    Procedure:  Insertion of Stent Right Superficial Femoral Artery ;  Surgeon: Serafina Mitchell, MD;  Location: [place];  Service: Vascular;  Laterality: Right;  . Lower extremity angiogram Bilateral 12/05/2011    Procedure: LOWER EXTREMITY ANGIOGRAM;  Surgeon: Serafina Mitchell, MD;  Location: [facility] CATH LAB;  Service: Cardiovascular;  Laterality: Bilateral;  . Abdominal aortagram N/A 12/09/2012    Procedure: ABDOMINAL AORTAGRAM;  Surgeon: Serafina Mitchell, MD;  Location: [facility] CATH LAB;  Service: Cardiovascular;  Laterality: N/A;  . Abdominal aortagram N/A 11/24/2013    Procedure:  ABDOMINAL AORTAGRAM;  Surgeon: Serafina Mitchell, MD;  Location: [facility] CATH LAB;  Service: Cardiovascular;  Laterality: N/A;    History   Social History  . Marital Status: Married    Spouse Name: N/A  . Number of Children: N/A  . Years of Education: N/A   Occupational History  . Not on file.   Social History Main Topics  . Smoking status: Former Smoker -- 2.00 packs/day for 20 years    Types: Cigarettes    Start date: 09/30/2011    Quit date: 10/30/2011  . Smokeless tobacco: Never Used  . Alcohol Use: No  . Drug Use: No  . Sexual Activity: Not Currently    Birth Control/ Protection: Post-menopausal   Other Topics Concern  . Not on file   Social History Narrative    Family History  Problem Relation Age of Onset  . Cancer Mother   . Depression Mother   . Heart disease Father     before age 37  . Diabetes Father   . Hypertension Father   . Hyperlipidemia Father   . Heart attack Father   . Cancer Maternal Grandmother     Allergies as of 03/29/2014 - Review Complete 03/29/2014  Allergen Reaction Noted  . Celebrex [celecoxib] Shortness Of Breath 03/05/2013  . Codeine Nausea Only     Current Outpatient Prescriptions on File Prior to Visit  Medication Sig Dispense Refill  . albuterol (PROVENTIL HFA;VENTOLIN HFA) 108 (90 BASE) MCG/ACT inhaler Inhale 1 puff into the lungs every 6 (six) hours as needed for wheezing or shortness of breath.    Marland Kitchen aspirin EC 81 MG tablet Take 1 tablet (81 mg total) by mouth daily.    Marland Kitchen atorvastatin (LIPITOR) 20 MG tablet Take 1 tablet (20 mg total) by mouth daily. 90 tablet 3  . BD PEN NEEDLE NANO U/F 32G X 4 MM MISC     . buPROPion (WELLBUTRIN XL) 300 MG 24 hr tablet Take 300 mg by mouth daily.    . CHANTIX STARTING MONTH PAK 0.5 MG X 11 & 1 MG X 42 tablet     . clindamycin (CLINDAGEL) 1 % gel Apply 1 application topically 2 (two) times daily.     . [employer] STOOL SOFTENER 100 MG capsule Take 100 mg by mouth daily.   3  . cyclobenzaprine  (FLEXERIL) 10 MG tablet Take 10 mg by mouth 2 (two) times daily as needed for muscle spasms.     . DULoxetine (CYMBALTA) 60 MG capsule Take 60 mg by mouth daily.    . Ferrous Sulfate (SLOW FE PO) Take 1 tablet by mouth daily.    . insulin aspart (NOVOLOG) 100 UNIT/ML injection Inject 42 Units into the skin at bedtime.     . insulin detemir (LEVEMIR) 100 UNIT/ML injection Inject 90 Units into the skin daily.     Marland Kitchen  JANUVIA 50 MG tablet Take 50 mg by mouth daily.     Marland Kitchen lisinopril (PRINIVIL,ZESTRIL) 2.5 MG tablet Take 2.5 mg by mouth daily.     Marland Kitchen losartan (COZAAR) 50 MG tablet Take 50 mg by mouth daily.    . Multiple Vitamins-Minerals (MULTIVITAMIN WITH MINERALS) tablet Take 1 tablet by mouth daily.    Marland Kitchen omeprazole (PRILOSEC) 40 MG capsule Take 40 mg by mouth 2 (two) times daily.    Marland Kitchen OVER THE COUNTER MEDICATION Take 630 mg by mouth daily. OTC Calcium    . oxyCODONE-acetaminophen (PERCOCET/ROXICET) 5-325 MG per tablet Take 1 tablet by mouth every 6 (six) hours as needed (pain).   0  . oxyCODONE-acetaminophen (PERCOCET/ROXICET) 5-325 MG per tablet Take 1-2 tablets by mouth every 4 (four) hours as needed for moderate pain. (Patient not taking: Reported on 03/29/2014) 30 tablet 0  . prasugrel (EFFIENT) 10 MG TABS tablet Take 1 tablet (10 mg total) by mouth daily. 30 tablet 11  . promethazine (PHENERGAN) 25 MG tablet Take 25 mg by mouth every 6 (six) hours as needed for nausea or vomiting.     . topiramate (TOPAMAX) 50 MG tablet Take 1 tablet (50 mg total) by mouth daily. 30 tablet 3  . vitamin B-12 (CYANOCOBALAMIN) 500 MCG tablet Take 500 mcg by mouth daily.     No current facility-administered medications on file prior to visit.     REVIEW OF SYSTEMS: Cardiovascular: No chest pain, chest pressure, palpitations, orthopnea, or dyspnea on exertion.  Thigh cramping with activity Pulmonary: No productive cough, asthma or wheezing. Neurologic: No weakness, paresthesias, aphasia, or amaurosis. No  dizziness. Hematologic: No bleeding problems or clotting disorders. Musculoskeletal: No joint pain or joint swelling. Gastrointestinal: No blood in stool or hematemesis Genitourinary: No dysuria or hematuria. Psychiatric:: No history of major depression. Integumentary: Drainage right groin Constitutional: No fever or chills.  PHYSICAL EXAMINATION:   Vital signs are BP 122/49 mmHg  Pulse 65  Resp 16  Ht 5\' 7"  (1.702 m)  Wt 270 lb (122.471 kg)  BMI 42.28 kg/m2  SpO2 98% General: The patient appears their stated age. HEENT:  No gross abnormalities Pulmonary:  Non labored breathing Abdomen: Soft and non-tender Musculoskeletal: There are no major deformities. Neurologic: No focal weakness or paresthesias are detected, Skin: Slight opening his right groin however incisions are basically healed. Psychiatric: The patient has normal affect. Cardiovascular: There is a regular rate and rhythm without significant murmur appreciated.   Diagnostic Studies Duplex was reviewed today.  Elevated velocities are noted at the origin of the right to left femoral-femoral bypass graft with peak velocity of 462.  In addition within the right superficial femoral artery stent peak velocity is 467.  Velocity is 294 in the proximal left superficial femoral artery  Assessment: Bilateral claudication Plan: After muscular liberation, I have elected to proceed with angiography via the left brachial approach to evaluate the aorta, femoral-femoral bypass graft and bilateral runoff.  Potentially, she will need intervention at the proximal right superficial femoral artery stent.  I may also have to get safe second access of her femoral-femoral graft if there is a stenosis at its origin.  Will also consider treating the left superficial femoral artery if it is significant.  This is been scheduled for Tuesday, March 15.  The patient will need to be off of her Effient for 3 days  V. Leia Alf, M.D. [facility] Office: [phone] Pager:  [phone]   2

## 2014-04-14 NOTE — Interval H&P Note (Signed)
History and Physical Interval Note:  04/14/2014 8:43 AM  Stephanie Peck  has presented today for surgery, with the diagnosis of bilateral lower extremity claudication  The various methods of treatment have been discussed with the patient and family. After consideration of risks, benefits and other options for treatment, the patient has consented to  Procedure(s): ABDOMINAL AORTAGRAM (N/A) as a surgical intervention .  The patient's history has been reviewed, patient examined, no change in status, stable for surgery.  I have reviewed the patient's chart and labs.  Questions were answered to the patient's satisfaction.     Stephanie Peck IV, V. WELLS

## 2014-04-14 NOTE — Discharge Instructions (Signed)
Angiogram °An angiogram, also called angiography, is a procedure used to look at the blood vessels that carry blood to different parts of your body (arteries). In this procedure, dye is injected through a long, thin tube (catheter) into an artery. X-rays are then taken. The X-rays will show if there is a blockage or problem in a blood vessel.  °LET YOUR HEALTH CARE PROVIDER KNOW ABOUT: °· Any allergies you have, including allergies to shellfish or contrast dye.   °· All medicines you are taking, including vitamins, herbs, eye drops, creams, and over-the-counter medicines.   °· Previous problems you or members of your family have had with the use of anesthetics.   °· Any blood disorders you have.   °· Previous surgeries you have had. °· Any previous kidney problems or failure you have had.  °· Medical conditions you have.   °· Possibility of pregnancy, if this applies. °RISKS AND COMPLICATIONS °Generally, an angiogram is a safe procedure. However, as with any procedure, problems can occur. Possible problems include: °· Injury to the blood vessels, including rupture or bleeding. °· Infection or bruising at the catheter site. °· Allergic reaction to the dye or contrast used. °· Kidney damage from the dye or contrast used. °· Blood clots that can lead to a stroke or heart attack. °BEFORE THE PROCEDURE °· Do not eat or drink after midnight on the night before the procedure, or as directed by your health care provider.   °· Ask your health care provider if you may drink enough water to take any needed medicines the morning of the procedure.   °PROCEDURE °· You may be given a medicine to help you relax (sedative) before and during the procedure. This medicine is given through an IV access tube that is inserted into one of your veins.   °· The area where the catheter will be inserted will be washed and shaved. This is usually done in the groin but may be done in the fold of your arm (near your elbow) or in the wrist. °· A  medicine will be given to numb the area where the catheter will be inserted (local anesthetic). °· The catheter will be inserted with a guide wire into an artery. The catheter is guided by using a type of X-ray (fluoroscopy) to the blood vessel being examined.   °· Dye is then injected into the catheter, and X-rays are taken. The dye helps to show where any narrowing or blockages are located.   °AFTER THE PROCEDURE  °· If the procedure is done through the leg, you will be kept in bed lying flat for several hours. You will be instructed to not bend or cross your legs. °· The insertion site will be checked frequently. °· The pulse in your feet or wrist will be checked frequently. °· Additional blood tests, X-rays, and electrocardiography may be done.   °· You may need to stay in the hospital overnight for observation.   °Document Released: 10/25/2004 Document Revised: 01/20/2013 Document Reviewed: 06/18/2012 °ExitCare® Patient Information ©2015 [employer]. This information is not intended to replace advice given to you by your health care provider. Make sure you discuss any questions you have with your health care provider. ° °

## 2014-04-16 ENCOUNTER — Telehealth: Payer: Self-pay | Admitting: Vascular Surgery

## 2014-04-16 NOTE — Telephone Encounter (Signed)
-----   Message from Mena Goes, RN sent at 04/15/2014 12:15 PM EDT ----- Regarding: RE: Schedule Yes, please keep June appts. Thanks :)   ----- Message -----    From: Gena Fray    Sent: 04/15/2014  11:30 AM      To: Mena Goes, RN Subject: RE: Schedule                                   Zigmund Daniel,   Will you look at Ms [facility] appointment desk. It looks like she is already following up in June. Can we just keep that appointment for her?  Thanks! Hinton Dyer ----- Message -----    From: Mena Goes, RN    Sent: 04/14/2014  10:48 AM      To: [facility] Admin Pool Subject: Schedule                                         ----- Message -----    From: Serafina Mitchell, MD    Sent: 04/14/2014  10:29 AM      To: [facility] Charge Pool  04/14/2014:  Surgeon:  Eldridge Abrahams Procedure Performed:  1.  Ultrasound-guided access, left brachial artery  2.  Abdominal aortogram  3.  Bilateral lower extremity runoff  4.  Drug coated balloon angioplasty, right superficial femoral artery  5.  Third order catheterization (femoral-femoral bypass graft)   Follow-up 3 months with duplex ultrasound

## 2014-04-27 ENCOUNTER — Other Ambulatory Visit (INDEPENDENT_AMBULATORY_CARE_PROVIDER_SITE_OTHER): Payer: Medicare Other | Admitting: *Deleted

## 2014-04-27 ENCOUNTER — Other Ambulatory Visit: Payer: Medicare Other

## 2014-04-27 DIAGNOSIS — I200 Unstable angina: Secondary | ICD-10-CM | POA: Diagnosis not present

## 2014-04-27 LAB — BASIC METABOLIC PANEL
BUN: 20 mg/dL (ref 6–23)
CO2: 25 mEq/L (ref 19–32)
Calcium: 9 mg/dL (ref 8.4–10.5)
Chloride: 104 mEq/L (ref 96–112)
Creatinine, Ser: 1.52 mg/dL — ABNORMAL HIGH (ref 0.40–1.20)
GFR: 37.66 mL/min — ABNORMAL LOW (ref >60.00)
Glucose, Bld: 270 mg/dL — ABNORMAL HIGH (ref 70–99)
Potassium: 3.7 mEq/L (ref 3.5–5.1)
SODIUM: 136 meq/L (ref 135–145)

## 2014-04-27 LAB — CBC WITH DIFFERENTIAL/PLATELET
Basophils Absolute: 0.1 10*3/uL (ref 0.0–0.1)
Basophils Relative: 0.9 % (ref 0.0–3.0)
EOS PCT: 4.7 % (ref 0.0–5.0)
Eosinophils Absolute: 0.4 10*3/uL (ref 0.0–0.7)
HCT: 39 % (ref 36.0–46.0)
Hemoglobin: 13 g/dL (ref 12.0–15.0)
LYMPHS ABS: 3.2 10*3/uL (ref 0.7–4.0)
LYMPHS PCT: 35.1 % (ref 12.0–46.0)
MCHC: 33.4 g/dL (ref 30.0–36.0)
MCV: 93.3 fl (ref 78.0–100.0)
Monocytes Absolute: 0.4 10*3/uL (ref 0.1–1.0)
Monocytes Relative: 4.8 % (ref 3.0–12.0)
Neutro Abs: 5 10*3/uL (ref 1.4–7.7)
Neutrophils Relative %: 54.5 % (ref 43.0–77.0)
Platelets: 429 10*3/uL — ABNORMAL HIGH (ref 150.0–400.0)
RBC: 4.18 Mil/uL (ref 3.87–5.11)
RDW: 14.6 % (ref 11.5–15.5)
WBC: 9.2 10*3/uL (ref 4.0–10.5)

## 2014-04-27 LAB — PROTIME-INR
INR: 1 ratio (ref 0.8–1.0)
Prothrombin Time: 10.8 s (ref 9.6–13.1)

## 2014-04-27 NOTE — Addendum Note (Signed)
Addended by: Eulis Foster on: 04/27/2014 08:42 AM   Modules accepted: Orders

## 2014-04-29 ENCOUNTER — Encounter (HOSPITAL_COMMUNITY)
Admission: RE | Disposition: A | Discharge status: Home / Self Care | Payer: Self-pay | Source: Ambulatory Visit | Attending: Cardiology

## 2014-04-29 ENCOUNTER — Ambulatory Visit (HOSPITAL_COMMUNITY)
Admission: RE | Admit: 2014-04-29 | Discharge: 2014-04-29 | Disposition: A | Discharge status: Home / Self Care | Payer: Medicare Other | Source: Ambulatory Visit | Attending: Cardiology | Admitting: Cardiology

## 2014-04-29 ENCOUNTER — Encounter (HOSPITAL_COMMUNITY): Payer: Self-pay | Admitting: Cardiology

## 2014-04-29 DIAGNOSIS — Z7902 Long term (current) use of antithrombotics/antiplatelets: Secondary | ICD-10-CM | POA: Insufficient documentation

## 2014-04-29 DIAGNOSIS — J45909 Unspecified asthma, uncomplicated: Secondary | ICD-10-CM | POA: Insufficient documentation

## 2014-04-29 DIAGNOSIS — Z8249 Family history of ischemic heart disease and other diseases of the circulatory system: Secondary | ICD-10-CM | POA: Diagnosis not present

## 2014-04-29 DIAGNOSIS — K219 Gastro-esophageal reflux disease without esophagitis: Secondary | ICD-10-CM | POA: Insufficient documentation

## 2014-04-29 DIAGNOSIS — F329 Major depressive disorder, single episode, unspecified: Secondary | ICD-10-CM | POA: Diagnosis not present

## 2014-04-29 DIAGNOSIS — I2510 Atherosclerotic heart disease of native coronary artery without angina pectoris: Secondary | ICD-10-CM | POA: Diagnosis not present

## 2014-04-29 DIAGNOSIS — E119 Type 2 diabetes mellitus without complications: Secondary | ICD-10-CM | POA: Diagnosis not present

## 2014-04-29 DIAGNOSIS — I509 Heart failure, unspecified: Secondary | ICD-10-CM | POA: Insufficient documentation

## 2014-04-29 DIAGNOSIS — J449 Chronic obstructive pulmonary disease, unspecified: Secondary | ICD-10-CM | POA: Insufficient documentation

## 2014-04-29 DIAGNOSIS — Z6841 Body Mass Index (BMI) 40.0 and over, adult: Secondary | ICD-10-CM | POA: Diagnosis not present

## 2014-04-29 DIAGNOSIS — G4733 Obstructive sleep apnea (adult) (pediatric): Secondary | ICD-10-CM | POA: Diagnosis not present

## 2014-04-29 DIAGNOSIS — R079 Chest pain, unspecified: Secondary | ICD-10-CM | POA: Diagnosis not present

## 2014-04-29 DIAGNOSIS — Z7982 Long term (current) use of aspirin: Secondary | ICD-10-CM | POA: Insufficient documentation

## 2014-04-29 DIAGNOSIS — Z792 Long term (current) use of antibiotics: Secondary | ICD-10-CM | POA: Diagnosis not present

## 2014-04-29 DIAGNOSIS — Z794 Long term (current) use of insulin: Secondary | ICD-10-CM | POA: Insufficient documentation

## 2014-04-29 DIAGNOSIS — I739 Peripheral vascular disease, unspecified: Secondary | ICD-10-CM | POA: Diagnosis not present

## 2014-04-29 DIAGNOSIS — Z79891 Long term (current) use of opiate analgesic: Secondary | ICD-10-CM | POA: Diagnosis not present

## 2014-04-29 DIAGNOSIS — Z79899 Other long term (current) drug therapy: Secondary | ICD-10-CM | POA: Insufficient documentation

## 2014-04-29 DIAGNOSIS — F419 Anxiety disorder, unspecified: Secondary | ICD-10-CM | POA: Diagnosis not present

## 2014-04-29 DIAGNOSIS — Z87891 Personal history of nicotine dependence: Secondary | ICD-10-CM | POA: Diagnosis not present

## 2014-04-29 DIAGNOSIS — I10 Essential (primary) hypertension: Secondary | ICD-10-CM | POA: Insufficient documentation

## 2014-04-29 DIAGNOSIS — Z9989 Dependence on other enabling machines and devices: Secondary | ICD-10-CM | POA: Diagnosis not present

## 2014-04-29 DIAGNOSIS — Z9884 Bariatric surgery status: Secondary | ICD-10-CM | POA: Insufficient documentation

## 2014-04-29 DIAGNOSIS — Z9981 Dependence on supplemental oxygen: Secondary | ICD-10-CM | POA: Insufficient documentation

## 2014-04-29 DIAGNOSIS — I200 Unstable angina: Secondary | ICD-10-CM | POA: Diagnosis present

## 2014-04-29 HISTORY — PX: LEFT HEART CATHETERIZATION WITH CORONARY ANGIOGRAM: SHX5451

## 2014-04-29 LAB — BASIC METABOLIC PANEL
ANION GAP: 11 (ref 5–15)
BUN: 21 mg/dL (ref 6–23)
CALCIUM: 9.1 mg/dL (ref 8.4–10.5)
CHLORIDE: 102 mmol/L (ref 96–112)
CO2: 22 mmol/L (ref 19–32)
CREATININE: 1.37 mg/dL — AB (ref 0.50–1.10)
GFR calc non Af Amer: 43 mL/min — ABNORMAL LOW (ref >90)
GFR, EST AFRICAN AMERICAN: 49 mL/min — AB (ref >90)
Glucose, Bld: 304 mg/dL — ABNORMAL HIGH (ref 70–99)
Potassium: 4.1 mmol/L (ref 3.5–5.1)
SODIUM: 135 mmol/L (ref 135–145)

## 2014-04-29 LAB — GLUCOSE, CAPILLARY: Glucose-Capillary: 275 mg/dL — ABNORMAL HIGH (ref 70–99)

## 2014-04-29 SURGERY — LEFT HEART CATHETERIZATION WITH CORONARY ANGIOGRAM
Anesthesia: LOCAL

## 2014-04-29 MED ORDER — ASPIRIN 81 MG PO CHEW: 81.0000 mg | CHEWABLE_TABLET | ORAL | Status: DC

## 2014-04-29 MED ORDER — SODIUM CHLORIDE 0.9 % IV SOLN
INTRAVENOUS | Status: DC
  Administered 2014-04-29: 08:00:00 via INTRAVENOUS

## 2014-04-29 MED ORDER — LIDOCAINE HCL (PF) 1 % IJ SOLN
INTRAMUSCULAR | Status: AC
  Filled 2014-04-29: qty 30

## 2014-04-29 MED ORDER — HEPARIN (PORCINE) IN NACL 2-0.9 UNIT/ML-% IJ SOLN
INTRAMUSCULAR | Status: AC
  Filled 2014-04-29: qty 1500

## 2014-04-29 MED ORDER — VERAPAMIL HCL 2.5 MG/ML IV SOLN
INTRAVENOUS | Status: AC
  Filled 2014-04-29: qty 2

## 2014-04-29 MED ORDER — MIDAZOLAM HCL 2 MG/2ML IJ SOLN
INTRAMUSCULAR | Status: AC
  Filled 2014-04-29: qty 2

## 2014-04-29 MED ORDER — SODIUM CHLORIDE 0.9 % IJ SOLN: 3.0000 mL | INTRAMUSCULAR | Status: DC | PRN

## 2014-04-29 MED ORDER — HEPARIN SODIUM (PORCINE) 1000 UNIT/ML IJ SOLN
INTRAMUSCULAR | Status: AC
  Filled 2014-04-29: qty 1

## 2014-04-29 MED ORDER — NITROGLYCERIN 1 MG/10 ML FOR IR/CATH LAB
INTRA_ARTERIAL | Status: AC
  Filled 2014-04-29: qty 10

## 2014-04-29 MED ORDER — FENTANYL CITRATE 0.05 MG/ML IJ SOLN
INTRAMUSCULAR | Status: AC
  Filled 2014-04-29: qty 2

## 2014-04-29 MED ORDER — SODIUM CHLORIDE 0.9 % IV SOLN: 250.0000 mL | INTRAVENOUS | Status: DC | PRN

## 2014-04-29 MED ORDER — SODIUM CHLORIDE 0.9 % IV SOLN: 1.0000 mL/kg/h | INTRAVENOUS | Status: DC

## 2014-04-29 MED ORDER — SODIUM CHLORIDE 0.9 % IJ SOLN: 3.0000 mL | Freq: Two times a day (BID) | INTRAMUSCULAR | Status: DC

## 2014-04-29 NOTE — Interval H&P Note (Signed)
Cath Lab Visit (complete for each Cath Lab visit)  Clinical Evaluation Leading to the Procedure:   ACS: No.  Non-ACS:    Anginal Classification: CCS III  Anti-ischemic medical therapy: No Therapy  Non-Invasive Test Results: Low-risk stress test findings: cardiac mortality <1%/year  Prior CABG: No previous CABG        History and Physical Interval Note:  04/29/2014 8:09 AM  Stephanie Peck  has presented today for surgery, with the diagnosis of UNSTABLE ANGINA  The various methods of treatment have been discussed with the patient and family. After consideration of risks, benefits and other options for treatment, the patient has consented to  Procedure(s): LEFT HEART CATHETERIZATION WITH CORONARY ANGIOGRAM (N/A) as a surgical intervention .  The patient's history has been reviewed, patient examined, no change in status, stable for surgery.  I have reviewed the patient's chart and labs.  Questions were answered to the patient's satisfaction.     SKAINS, MARK

## 2014-04-29 NOTE — Discharge Instructions (Signed)
Radial Site Care °Refer to this sheet in the next few weeks. These instructions provide you with information on caring for yourself after your procedure. Your caregiver may also give you more specific instructions. Your treatment has been planned according to current medical practices, but problems sometimes occur. Call your caregiver if you have any problems or questions after your procedure. °HOME CARE INSTRUCTIONS °· You may shower the day after the procedure. Remove the bandage (dressing) and gently wash the site with plain soap and water. Gently pat the site dry. °· Do not apply powder or lotion to the site. °· Do not submerge the affected site in water for 3 to 5 days. °· Inspect the site at least twice daily. °· Do not flex or bend the affected arm for 24 hours. °· No lifting over 5 pounds (2.3 kg) for 5 days after your procedure. °· Do not drive home if you are discharged the same day of the procedure. Have someone else drive you. °· You may drive 24 hours after the procedure unless otherwise instructed by your caregiver. °· Do not operate machinery or power tools for 24 hours. °· A responsible adult should be with you for the first 24 hours after you arrive home. °What to expect: °· Any bruising will usually fade within 1 to 2 weeks. °· Blood that collects in the tissue (hematoma) may be painful to the touch. It should usually decrease in size and tenderness within 1 to 2 weeks. °SEEK IMMEDIATE MEDICAL CARE IF: °· You have unusual pain at the radial site. °· You have redness, warmth, swelling, or pain at the radial site. °· You have drainage (other than a small amount of blood on the dressing). °· You have chills. °· You have a fever or persistent symptoms for more than 72 hours. °· You have a fever and your symptoms suddenly get worse. °· Your arm becomes pale, cool, tingly, or numb. °· You have heavy bleeding from the site. Hold pressure on the site. °Document Released: 02/17/2010 Document Revised:  04/09/2011 Document Reviewed: 02/17/2010 °ExitCare® Patient Information ©2015 [employer]. This information is not intended to replace advice given to you by your health care provider. Make sure you discuss any questions you have with your health care provider. ° °

## 2014-04-29 NOTE — CV Procedure (Signed)
    CARDIAC CATHETERIZATION  PROCEDURE:  Left heart catheterization with selective coronary angiography, left ventriculogram via the radial artery approach.  INDICATIONS:  Chest pain. NUC 2013 low risk. See office note  The risks, benefits, and details of the procedure were explained to the patient, including possibilities of stroke, heart attack, death, renal impairment, arterial damage, bleeding.  The patient verbalized understanding and wanted to proceed.  Informed written consent was obtained.  PROCEDURE TECHNIQUE:  Allen's test was performed pre-and post procedure and was normal. Ultrasound utilized for radial access. 2 attempts made and successful.  The right radial artery site was prepped and draped in a sterile fashion. One percent lidocaine was used for local anesthesia. Using the modified Seldinger technique a 5 French hydrophilic sheath was inserted into the radial artery without difficulty. 3 mg of verapamil was administered via the sheath. A Judkins right #4 catheter with the guidance of a Versicore wire was placed in the right coronary cusp and selectively cannulated the right coronary artery. After traversing the aortic arch, 6000 units of heparin IV was administered. A Judkins left #3.5 catheter was used to selectively cannulate the left main artery. Multiple views with hand injection of Omnipaque were obtained. Catheter a pigtail catheter was used to cross into the left ventricle, hemodynamics were obtained, and a left ventriculogram was performed in the RAO position with power injection. Following the procedure, sheath was removed, patient was hemodynamically stable, hemostasis was maintained with a Terumo T band.   CONTRAST:  Total of 90 ml.    FLOUROSCOPY TIME: 2.3 min.  COMPLICATIONS:  None.    HEMODYNAMICS:  Aortic pressure was 267/12 mmHg; LV systolic pressure was 458KDXI; LVEDP 35mmHg.  There was no gradient between the left ventricle and aorta.    ANGIOGRAPHIC DATA:     Left main: No CAD angiographically.  Left anterior descending (LAD): Mild 20% stenosis proximal LAD otherwise no significant CAD.  Circumflex artery (CIRC): No CAD angiographically.  Right coronary artery (RCA): Dominant vessel. No CAD angiographically.   LEFT VENTRICULOGRAM:  Left ventricular angiogram was done in the 30 RAO projection and revealed normal left ventricular wall motion and systolic function with an estimated ejection fraction of 65%.   IMPRESSIONS:  No angiographically significant CAD with only 20% proximal LAD stenosis. No change from 2006. Normal left ventricular systolic function.  LVEDP 17 mmHg.  Ejection fraction 65%.  RECOMMENDATION:  Reassuring. Non cardiac chest pain. Restart losartan in 2 days. Will see back in clinic. Creatinine 1.3 from 1.5, now at baseline. Hydration.    Candee Furbish, MD

## 2014-04-29 NOTE — H&P (View-Only) (Signed)
Cardiology Office Note   Date:  04/07/2014   ID:  Stephanie Peck, DOB 02/21/1960, MRN [id number]  PCP:  Jonathon Bellows, MD  Cardiologist:   Candee Furbish, MD   Chest pain    History of Present Illness: Stephanie Peck is a 54 y.o. female who presents for chest pain. She has a history of peripheral vascular disease, obesity, mild COPD, sleep apnea, diastolic dysfunction with prior left femoral endarterectomy, right lower extremity stent with nuclear stress test that was previously low risk 01/03/12, no ischemia. No prior cardiac catheterization.  Has had chest discomfort for years, GERD. Happening frequently. H2 blocker, TUMS on top of PPI. Feels like a wham to the chest down to arm and up to jaw. Came on without exertion. A few times concerned. When lay down it is worse. Can last 8min to 1hr. Takes pain pill. Sometimes feels it like a small pressure with walking. Not as severe when walking.   No prior cath.   Her father had bypass surgery at age 24.  She continues to worry about the symptoms.  Next Tuesday she is having lower externally angiogram by Dr. Trula Slade.      Past Medical History  Diagnosis Date  . GERD (gastroesophageal reflux disease)   . Anxiety   . CHF (congestive heart failure)   . COPD (chronic obstructive pulmonary disease)   . Depression   . Morbid obesity   . Hypertension   . Asthma   . On home oxygen therapy     "2L q hs" (12/19/2013)  . Chronic bronchitis     "get it pretty much q yr"  . OSA on CPAP     severe OSA by 09/08/06 sleep study ([facility])  . Type II diabetes mellitus   . Peripheral vascular disease     Past Surgical History  Procedure Laterality Date  . Cholecystectomy    . Roux-en-y gastric bypass  2006  . Tonsillectomy    . Femoral artery stent Right 12/05/2011    superficial   . Endarterectomy femoral  02/22/2012    Procedure: ENDARTERECTOMY FEMORAL;  Surgeon: Serafina Mitchell, MD;  Location: [place];  Service: Vascular;  Laterality:  Left;  . Patch angioplasty  02/22/2012    Procedure: PATCH ANGIOPLASTY;  Surgeon: Serafina Mitchell, MD;  Location: [facility] OR;  Service: Vascular;  Laterality: Left;  left femoral patch angioplasty  . Application of wound vac  02/22/2012    Procedure: APPLICATION OF WOUND VAC;  Surgeon: Serafina Mitchell, MD;  Location: [place];  Service: Vascular;  Laterality: Left;  application wound vac  . Groin debridement  03/01/2012    Procedure: GROIN DEBRIDEMENT;  Surgeon: Rosetta Posner, MD;  Location: [place];  Service: Vascular;  Laterality: Left;  . Aortogram Left 04/04/2012    Procedure: AORTOGRAM;  Surgeon: Serafina Mitchell, MD;  Location: [place];  Service: Vascular;  Laterality: Left;  . Patch angioplasty Left 04/04/2012    Procedure: PATCH ANGIOPLASTY;  Surgeon: Serafina Mitchell, MD;  Location: [place];  Service: Vascular;  Laterality: Left;  Marland Kitchen Muscle flap closure Left 04/04/2012    Procedure: MUSCLE FLAP CLOSURE;  Surgeon: Theodoro Kos, DO;  Location: [place];  Service: Plastics;  Laterality: Left;  . Irrigation and debridement abscess Left 03/05/2013    Procedure: IRRIGATION AND DEBRIDEMENT ABSCESS;  Surgeon: Zenovia Jarred, MD;  Location: [place];  Service: General;  Laterality: Left;  . Breast biopsy Right ~ 2005  .  Femoral artery - femoral artery bypass graft  12/17/2013  . Femoral artery stent Right 12/17/2013  . Femoral-femoral bypass graft N/A 12/17/2013    Procedure: BYPASS GRAFT FEMORAL-FEMORAL ARTERY-RIGHT TO LEFT;  Surgeon: Serafina Mitchell, MD;  Location: [facility];  Service: Vascular;  Laterality: N/A;  . Insertion of iliac stent Right 12/17/2013    Procedure:  Insertion of Stent Right Superficial Femoral Artery ;  Surgeon: Serafina Mitchell, MD;  Location: [place];  Service: Vascular;  Laterality: Right;  . Lower extremity angiogram Bilateral 12/05/2011    Procedure: LOWER EXTREMITY ANGIOGRAM;  Surgeon: Serafina Mitchell, MD;  Location: [facility] CATH LAB;  Service: Cardiovascular;  Laterality: Bilateral;  . Abdominal aortagram  N/A 12/09/2012    Procedure: ABDOMINAL AORTAGRAM;  Surgeon: Serafina Mitchell, MD;  Location: [facility] CATH LAB;  Service: Cardiovascular;  Laterality: N/A;  . Abdominal aortagram N/A 11/24/2013    Procedure: ABDOMINAL AORTAGRAM;  Surgeon: Serafina Mitchell, MD;  Location: [facility] CATH LAB;  Service: Cardiovascular;  Laterality: N/A;     Current Outpatient Prescriptions  Medication Sig Dispense Refill  . albuterol (PROVENTIL HFA;VENTOLIN HFA) 108 (90 BASE) MCG/ACT inhaler Inhale 1 puff into the lungs every 6 (six) hours as needed for wheezing or shortness of breath.    Marland Kitchen aspirin EC 81 MG tablet Take 1 tablet (81 mg total) by mouth daily.    Marland Kitchen atorvastatin (LIPITOR) 20 MG tablet Take 1 tablet (20 mg total) by mouth daily. 90 tablet 3  . BD PEN NEEDLE NANO U/F 32G X 4 MM MISC     . buPROPion (WELLBUTRIN XL) 300 MG 24 hr tablet Take 300 mg by mouth daily.    . clindamycin (CLINDAGEL) 1 % gel Apply 1 application topically 2 (two) times daily.     . CVS STOOL SOFTENER 100 MG capsule Take 100 mg by mouth daily.   3  . cyclobenzaprine (FLEXERIL) 10 MG tablet Take 10 mg by mouth 2 (two) times daily as needed for muscle spasms.     . DULoxetine (CYMBALTA) 60 MG capsule Take 60 mg by mouth daily.    . Ferrous Sulfate (SLOW FE PO) Take 1 tablet by mouth daily.    . insulin aspart (NOVOLOG) 100 UNIT/ML injection Inject 42 Units into the skin at bedtime.     . insulin detemir (LEVEMIR) 100 UNIT/ML injection Inject 90 Units into the skin daily.     Marland Kitchen JANUVIA 50 MG tablet Take 50 mg by mouth daily.     Marland Kitchen losartan (COZAAR) 50 MG tablet Take 50 mg by mouth daily.    . Multiple Vitamins-Minerals (MULTIVITAMIN WITH MINERALS) tablet Take 1 tablet by mouth daily.    Marland Kitchen omeprazole (PRILOSEC) 40 MG capsule Take 40 mg by mouth 2 (two) times daily.    Marland Kitchen OVER THE COUNTER MEDICATION Take 630 mg by mouth daily. OTC Calcium    . oxyCODONE-acetaminophen (PERCOCET/ROXICET) 5-325 MG per tablet Take 1 tablet by mouth every 6 (six) hours  as needed (pain).   0  . prasugrel (EFFIENT) 10 MG TABS tablet Take 1 tablet (10 mg total) by mouth daily. 30 tablet 11  . promethazine (PHENERGAN) 25 MG tablet Take 25 mg by mouth every 6 (six) hours as needed for nausea or vomiting.     . topiramate (TOPAMAX) 50 MG tablet Take 1 tablet (50 mg total) by mouth daily. 30 tablet 3  . vitamin B-12 (CYANOCOBALAMIN) 500 MCG tablet Take 500 mcg by mouth daily.  No current facility-administered medications for this visit.    Allergies:   Celebrex and Codeine    Social History:  The patient  reports that she quit smoking about 2 years ago. Her smoking use included Cigarettes. She started smoking about 2 years ago. She has a 40 pack-year smoking history. She has never used smokeless tobacco. She reports that she does not drink alcohol or use illicit drugs.   Family History:  The patient's family history includes Cancer in her maternal grandmother and mother; Depression in her mother; Diabetes in her father; Heart attack in her father; Heart disease in her father; Hyperlipidemia in her father; Hypertension in her father. Father 21 CABG   ROS:  Please see the history of present illness.   Otherwise, review of systems are positive for none.   All other systems are reviewed and negative.    PHYSICAL EXAM: VS:  BP 148/78 mmHg  Pulse 95  Ht 5\' 7"  (1.702 m)  Wt 284 lb (128.822 kg)  BMI 44.47 kg/m2 , BMI Body mass index is 44.47 kg/(m^2). GEN: Well nourished, well developed, in no acute distress HEENT: normal Neck: no JVD, carotid bruits, or masses Cardiac: RRR; no murmurs, rubs, or gallops,no edema  Respiratory:  clear to auscultation bilaterally, normal work of breathing GI: soft, nontender,distended, + BS, obese MS: no deformity or atrophy Skin: warm and dry, no rash, varicose veins noted left greater than right Neuro:  Strength and sensation are intact Psych: euthymic mood, full affect   EKG:  EKG is ordered today. The ekg ordered today  demonstrates sinus rhythm heart rate 95, poor R-wave progression   Recent Labs: 12/11/2013: ALT 28 12/21/2013: BUN 19; Creatinine 1.25*; Hemoglobin 10.2*; Platelets 370; Potassium 4.9; Sodium 139    Lipid Panel No results found for: CHOL, TRIG, HDL, CHOLHDL, VLDL, LDLCALC, LDLDIRECT    Wt Readings from Last 3 Encounters:  04/07/14 284 lb (128.822 kg)  03/29/14 270 lb (122.471 kg)  02/01/14 272 lb (123.378 kg)       ASSESSMENT AND PLAN:  1.  Worsening angina-I will go ahead and perform diagnostic coronary angiography via the right radial artery approach. Risks and benefits explained including stroke, heart attack, death, renal impairment. She has had a nuclear stress test in 2013 however I feel that her symptoms are progressing has typical and atypical features. Nuclear stress test would be decreased sensitivity because of body habitus. I want to go ahead and get her peripheral angiogram done first which is already been scheduled. I will allow her to heal and then schedule on March 31.  2. Morbid obesity-encourage weight loss  3. GERD-symptoms are challenging, possible GI although atypical, could be musculoskeletal, Dr. Paulita Fujita sees her.  4. Essential hypertension - stopping lisinopril 2.5 mg. Continue losartan 50.   Current medicines are reviewed at length with the patient today.  The patient does not have concerns regarding medicines.    The following changes have been made:  no change  Labs/ tests ordered today include: Cardiac cath  Orders Placed This Encounter  Procedures  . CBC w/Diff  . INR/PT  . Basic metabolic panel  . EKG 12-Lead     Disposition:   FU with 2 weeks post cath  Bobby Rumpf, MD  04/07/2014 5:28 PM    [facility] [address], [place], [place]  [address] Phone: [phone]; Fax: [phone]

## 2014-05-12 ENCOUNTER — Encounter: Payer: Self-pay | Admitting: Cardiology

## 2014-05-12 ENCOUNTER — Ambulatory Visit (INDEPENDENT_AMBULATORY_CARE_PROVIDER_SITE_OTHER): Payer: Medicare Other | Admitting: Cardiology

## 2014-05-12 VITALS — BP 156/82 | HR 136 | Ht 67.0 in | Wt 282.4 lb

## 2014-05-12 DIAGNOSIS — K219 Gastro-esophageal reflux disease without esophagitis: Secondary | ICD-10-CM

## 2014-05-12 DIAGNOSIS — I200 Unstable angina: Secondary | ICD-10-CM

## 2014-05-12 DIAGNOSIS — I739 Peripheral vascular disease, unspecified: Secondary | ICD-10-CM

## 2014-05-12 NOTE — Patient Instructions (Signed)
**Note De-identified Tarini Carrier Obfuscation** Your physician recommends that you continue on your current medications as directed. Please refer to the Current Medication list given to you today.  Your physician wants you to follow-up in: 1 year. You will receive a reminder letter in the mail two months in advance. If you don't receive a letter, please call our office to schedule the follow-up appointment.  

## 2014-05-12 NOTE — Progress Notes (Signed)
Cardiology Office Note   Date:  05/12/2014   ID:  Stephanie Peck, DOB 13-Jun-1960, MRN [id number]  PCP:  Jonathon Bellows, MD  Cardiologist:   Candee Furbish, MD   Chest pain    History of Present Illness: Stephanie Peck is a 54 y.o. female who presents for follow-up of chest pain evaluation which included diagnostic cardiac catheterization on 04/29/14 which was reassuring with only 20% proximal LAD stenosis. Normal ejection fraction. She has a history of peripheral vascular disease, obesity, mild COPD, sleep apnea, diastolic dysfunction with prior left femoral endarterectomy, right lower extremity stent with nuclear stress test that was previously low risk 01/03/12, no ischemia.   Has had chest discomfort for years, GERD. Happening frequently. H2 blocker, TUMS on top of PPI. Feels like a wham to the chest down to arm and up to jaw. Came on without exertion. A few times concerned. When lay down it is worse. Can last 64min to 1hr. Takes pain pill. Sometimes feels it like a small pressure with walking. Not as severe when walking.   Her father had bypass surgery at age 25.  Having lower externally angiogram by Dr. Trula Slade.  Since cardiac catheterization/reassurance she is felt better. Minor bruising noted.      Past Medical History  Diagnosis Date  . GERD (gastroesophageal reflux disease)   . Anxiety   . CHF (congestive heart failure)   . COPD (chronic obstructive pulmonary disease)   . Depression   . Morbid obesity   . Hypertension   . Asthma   . On home oxygen therapy     "2L q hs" (12/19/2013)  . Chronic bronchitis     "get it pretty much q yr"  . OSA on CPAP     severe OSA by 09/08/06 sleep study ([facility])  . Type II diabetes mellitus   . Peripheral vascular disease     Past Surgical History  Procedure Laterality Date  . Cholecystectomy    . Roux-en-y gastric bypass  2006  . Tonsillectomy    . Femoral artery stent Right 12/05/2011    superficial   . Endarterectomy  femoral  02/22/2012    Procedure: ENDARTERECTOMY FEMORAL;  Surgeon: Serafina Mitchell, MD;  Location: [place];  Service: Vascular;  Laterality: Left;  . Patch angioplasty  02/22/2012    Procedure: PATCH ANGIOPLASTY;  Surgeon: Serafina Mitchell, MD;  Location: [facility] OR;  Service: Vascular;  Laterality: Left;  left femoral patch angioplasty  . Application of wound vac  02/22/2012    Procedure: APPLICATION OF WOUND VAC;  Surgeon: Serafina Mitchell, MD;  Location: [place];  Service: Vascular;  Laterality: Left;  application wound vac  . Groin debridement  03/01/2012    Procedure: GROIN DEBRIDEMENT;  Surgeon: Rosetta Posner, MD;  Location: [place];  Service: Vascular;  Laterality: Left;  . Aortogram Left 04/04/2012    Procedure: AORTOGRAM;  Surgeon: Serafina Mitchell, MD;  Location: [place];  Service: Vascular;  Laterality: Left;  . Patch angioplasty Left 04/04/2012    Procedure: PATCH ANGIOPLASTY;  Surgeon: Serafina Mitchell, MD;  Location: [place];  Service: Vascular;  Laterality: Left;  Marland Kitchen Muscle flap closure Left 04/04/2012    Procedure: MUSCLE FLAP CLOSURE;  Surgeon: Theodoro Kos, DO;  Location: [place];  Service: Plastics;  Laterality: Left;  . Irrigation and debridement abscess Left 03/05/2013    Procedure: IRRIGATION AND DEBRIDEMENT ABSCESS;  Surgeon: Zenovia Jarred, MD;  Location: [place];  Service: General;  Laterality: Left;  . Breast biopsy Right ~ 2005  . Femoral artery - femoral artery bypass graft  12/17/2013  . Femoral artery stent Right 12/17/2013  . Femoral-femoral bypass graft N/A 12/17/2013    Procedure: BYPASS GRAFT FEMORAL-FEMORAL ARTERY-RIGHT TO LEFT;  Surgeon: Serafina Mitchell, MD;  Location: [place];  Service: Vascular;  Laterality: N/A;  . Insertion of iliac stent Right 12/17/2013    Procedure:  Insertion of Stent Right Superficial Femoral Artery ;  Surgeon: Serafina Mitchell, MD;  Location: [place];  Service: Vascular;  Laterality: Right;  . Lower extremity angiogram Bilateral 12/05/2011    Procedure: LOWER EXTREMITY  ANGIOGRAM;  Surgeon: Serafina Mitchell, MD;  Location: [facility] CATH LAB;  Service: Cardiovascular;  Laterality: Bilateral;  . Abdominal aortagram N/A 12/09/2012    Procedure: ABDOMINAL AORTAGRAM;  Surgeon: Serafina Mitchell, MD;  Location: [facility] CATH LAB;  Service: Cardiovascular;  Laterality: N/A;  . Abdominal aortagram N/A 11/24/2013    Procedure: ABDOMINAL AORTAGRAM;  Surgeon: Serafina Mitchell, MD;  Location: [facility] CATH LAB;  Service: Cardiovascular;  Laterality: N/A;  . Abdominal aortagram N/A 04/14/2014    Procedure: ABDOMINAL AORTAGRAM;  Surgeon: Serafina Mitchell, MD;  Location: [facility] CATH LAB;  Service: Cardiovascular;  Laterality: N/A;  . Left heart catheterization with coronary angiogram N/A 04/29/2014    Procedure: LEFT HEART CATHETERIZATION WITH CORONARY ANGIOGRAM;  Surgeon: Jerline Pain, MD;  Location: [facility] CATH LAB;  Service: Cardiovascular;  Laterality: N/A;     Current Outpatient Prescriptions  Medication Sig Dispense Refill  . albuterol (PROVENTIL HFA;VENTOLIN HFA) 108 (90 BASE) MCG/ACT inhaler Inhale 1 puff into the lungs every 6 (six) hours as needed for wheezing or shortness of breath.    Marland Kitchen aspirin EC 81 MG tablet Take 1 tablet (81 mg total) by mouth daily.    Marland Kitchen atorvastatin (LIPITOR) 20 MG tablet Take 1 tablet (20 mg total) by mouth daily. 90 tablet 3  . BD PEN NEEDLE NANO U/F 32G X 4 MM MISC     . buPROPion (WELLBUTRIN XL) 300 MG 24 hr tablet Take 300 mg by mouth daily.    . CVS STOOL SOFTENER 100 MG capsule Take 100 mg by mouth daily.   3  . cyclobenzaprine (FLEXERIL) 10 MG tablet Take 10 mg by mouth 2 (two) times daily as needed for muscle spasms.     . DULoxetine (CYMBALTA) 60 MG capsule Take 60 mg by mouth daily.    . Ferrous Sulfate (SLOW FE PO) Take 1 tablet by mouth daily.    . insulin aspart (NOVOLOG) 100 UNIT/ML injection Inject 42 Units into the skin at bedtime.     . insulin detemir (LEVEMIR) 100 UNIT/ML injection Inject 90 Units into the skin daily.     Marland Kitchen JANUVIA 50 MG tablet Take  50 mg by mouth daily.     Marland Kitchen losartan (COZAAR) 50 MG tablet Take 50 mg by mouth daily.    . Multiple Vitamins-Minerals (MULTIVITAMIN WITH MINERALS) tablet Take 1 tablet by mouth daily.    Marland Kitchen omeprazole (PRILOSEC) 40 MG capsule Take 40 mg by mouth 2 (two) times daily.    Marland Kitchen OVER THE COUNTER MEDICATION Take 630 mg by mouth daily. OTC Calcium    . oxyCODONE-acetaminophen (PERCOCET/ROXICET) 5-325 MG per tablet Take 1 tablet by mouth every 6 (six) hours as needed (pain).   0  . prasugrel (EFFIENT) 10 MG TABS tablet Take 1 tablet (10 mg total) by mouth daily. 30 tablet  11  . promethazine (PHENERGAN) 25 MG tablet Take 25 mg by mouth every 6 (six) hours as needed for nausea or vomiting.     . topiramate (TOPAMAX) 50 MG tablet Take 1 tablet (50 mg total) by mouth daily. 30 tablet 3  . vitamin B-12 (CYANOCOBALAMIN) 500 MCG tablet Take 500 mcg by mouth daily.     No current facility-administered medications for this visit.    Allergies:   Celebrex and Codeine    Social History:  The patient  reports that she quit smoking about 2 years ago. Her smoking use included Cigarettes. She started smoking about 2 years ago. She has a 40 pack-year smoking history. She has never used smokeless tobacco. She reports that she does not drink alcohol or use illicit drugs.   Family History:  The patient's family history includes Cancer in her maternal grandmother and mother; Depression in her mother; Diabetes in her father; Heart attack in her father; Heart disease in her father; Hyperlipidemia in her father; Hypertension in her father. Father 43 CABG   ROS:  Please see the history of present illness.   Otherwise, review of systems are positive for none.   All other systems are reviewed and negative.    PHYSICAL EXAM: VS:  BP 156/82 mmHg  Pulse 136  Ht 5\' 7"  (1.702 m)  Wt 282 lb 6.4 oz (128.096 kg)  BMI 44.22 kg/m2 , BMI Body mass index is 44.22 kg/(m^2). GEN: Well nourished, well developed, in no acute  distress HEENT: normal Neck: no JVD, carotid bruits, or masses Cardiac: RRR; no murmurs, rubs, or gallops,no edema  Respiratory:  clear to auscultation bilaterally, normal work of breathing GI: soft, nontender,distended, + BS, obese MS: no deformity or atrophy Skin: warm and dry, no rash, varicose veins noted left greater than right. Right radial artery site noted. Minor bruising at antecubital fossa. Neuro:  Strength and sensation are intact Psych: euthymic mood, full affect   EKG:  EKG is ordered today. The ekg ordered today demonstrates sinus rhythm heart rate 95, poor R-wave progression   Recent Labs: 12/11/2013: ALT 28 04/27/2014: Hemoglobin 13.0; Platelets 429.0* 04/29/2014: BUN 21; Creatinine 1.37*; Potassium 4.1; Sodium 135    Lipid Panel No results found for: CHOL, TRIG, HDL, CHOLHDL, VLDL, LDLCALC, LDLDIRECT    Wt Readings from Last 3 Encounters:  05/12/14 282 lb 6.4 oz (128.096 kg)  04/14/14 280 lb (127.007 kg)  04/07/14 284 lb (128.822 kg)       ASSESSMENT AND PLAN:  1.  Prior chest pain-diagnostic coronary angiography via the right radial artery approach was quite reassuring. Only 20% proximal LAD stenosis. No change from 2006. Ejection fraction was normal at 65%. Continue with aggressive medical management.  2. Morbid obesity-encourage weight loss, decrease carbohydrates.  3. GERD-symptoms are challenging, possible GI although atypical, could be musculoskeletal, Dr. Paulita Fujita sees her. Cardiac evaluation reassuring.  4. Essential hypertension - stopping lisinopril 2.5 mg. Continue losartan 50 to avoid comminution of a/ARB. Blood pressure is elevated today however usually at home it is normal.  5. Peripheral vascular disease-currently on dual antiplatelet therapy. Continue to follow with Dr. Trula Slade.   Current medicines are reviewed at length with the patient today.  The patient does not have concerns regarding medicines.    The following changes have been  made:  no change  Labs/ tests ordered today include:  No orders of the defined types were placed in this encounter.     Disposition:   FU with Denean Pavon 1  year  Signed, Candee Furbish, MD  05/12/2014 9:33 AM    [address], [place], [place]  [address] Phone: [phone]; Fax: [phone]

## 2014-06-01 DIAGNOSIS — E1151 Type 2 diabetes mellitus with diabetic peripheral angiopathy without gangrene: Secondary | ICD-10-CM | POA: Diagnosis not present

## 2014-06-01 DIAGNOSIS — Z0000 Encounter for general adult medical examination without abnormal findings: Secondary | ICD-10-CM | POA: Diagnosis not present

## 2014-06-01 DIAGNOSIS — E782 Mixed hyperlipidemia: Secondary | ICD-10-CM | POA: Diagnosis not present

## 2014-06-01 DIAGNOSIS — I10 Essential (primary) hypertension: Secondary | ICD-10-CM | POA: Diagnosis not present

## 2014-06-24 ENCOUNTER — Other Ambulatory Visit: Payer: Self-pay | Admitting: Cardiology

## 2014-06-25 NOTE — Telephone Encounter (Signed)
Per note 4.13.16

## 2014-07-07 ENCOUNTER — Encounter: Payer: Self-pay | Admitting: Surgery

## 2014-07-12 ENCOUNTER — Encounter (HOSPITAL_COMMUNITY): Payer: Medicare Other

## 2014-07-12 ENCOUNTER — Ambulatory Visit: Payer: Medicare Other | Admitting: Surgery

## 2014-07-26 ENCOUNTER — Other Ambulatory Visit: Payer: Self-pay

## 2014-08-05 DIAGNOSIS — N183 Chronic kidney disease, stage 3 (moderate): Secondary | ICD-10-CM | POA: Diagnosis not present

## 2014-08-05 DIAGNOSIS — Z794 Long term (current) use of insulin: Secondary | ICD-10-CM | POA: Diagnosis not present

## 2014-08-05 DIAGNOSIS — E1165 Type 2 diabetes mellitus with hyperglycemia: Secondary | ICD-10-CM | POA: Diagnosis not present

## 2014-08-05 DIAGNOSIS — E1121 Type 2 diabetes mellitus with diabetic nephropathy: Secondary | ICD-10-CM | POA: Diagnosis not present

## 2014-08-11 ENCOUNTER — Encounter: Payer: Self-pay | Admitting: Surgery

## 2014-08-13 ENCOUNTER — Other Ambulatory Visit: Payer: Self-pay | Admitting: Surgery

## 2014-08-13 ENCOUNTER — Ambulatory Visit (INDEPENDENT_AMBULATORY_CARE_PROVIDER_SITE_OTHER): Payer: Medicare Other | Admitting: Surgery

## 2014-08-13 ENCOUNTER — Ambulatory Visit (INDEPENDENT_AMBULATORY_CARE_PROVIDER_SITE_OTHER)
Admission: RE | Admit: 2014-08-13 | Discharge: 2014-08-13 | Disposition: A | Discharge status: Home / Self Care | Payer: Medicare Other | Source: Ambulatory Visit | Attending: Surgery | Admitting: Surgery

## 2014-08-13 ENCOUNTER — Ambulatory Visit (HOSPITAL_COMMUNITY)
Admission: RE | Admit: 2014-08-13 | Discharge: 2014-08-13 | Disposition: A | Discharge status: Home / Self Care | Payer: Medicare Other | Source: Ambulatory Visit | Attending: Surgery | Admitting: Surgery

## 2014-08-13 DIAGNOSIS — Z9862 Peripheral vascular angioplasty status: Secondary | ICD-10-CM

## 2014-08-13 DIAGNOSIS — Z95820 Peripheral vascular angioplasty status with implants and grafts: Secondary | ICD-10-CM | POA: Insufficient documentation

## 2014-08-13 DIAGNOSIS — I739 Peripheral vascular disease, unspecified: Secondary | ICD-10-CM

## 2014-08-13 DIAGNOSIS — I10 Essential (primary) hypertension: Secondary | ICD-10-CM | POA: Diagnosis not present

## 2014-08-13 DIAGNOSIS — I200 Unstable angina: Secondary | ICD-10-CM | POA: Diagnosis not present

## 2014-08-13 DIAGNOSIS — I70203 Unspecified atherosclerosis of native arteries of extremities, bilateral legs: Secondary | ICD-10-CM | POA: Diagnosis not present

## 2014-08-13 DIAGNOSIS — E785 Hyperlipidemia, unspecified: Secondary | ICD-10-CM | POA: Insufficient documentation

## 2014-08-13 DIAGNOSIS — Z9889 Other specified postprocedural states: Secondary | ICD-10-CM | POA: Diagnosis not present

## 2014-08-13 DIAGNOSIS — E119 Type 2 diabetes mellitus without complications: Secondary | ICD-10-CM | POA: Diagnosis not present

## 2014-08-13 DIAGNOSIS — Z87891 Personal history of nicotine dependence: Secondary | ICD-10-CM | POA: Diagnosis not present

## 2014-08-13 DIAGNOSIS — Z48812 Encounter for surgical aftercare following surgery on the circulatory system: Secondary | ICD-10-CM

## 2014-08-13 NOTE — Progress Notes (Signed)
Patient name: Stephanie Peck MRN: [id number] DOB: 01-31-1960 Sex: female     Chief Complaint  Patient presents with  . PVD    f/u  lab study prior    HISTORY OF PRESENT ILLNESS: The patient comes in for follow-up today.  On 12/05/2011 she underwent stenting of her right superficial femoral artery.  On 02/22/2012 she underwent left femoral endarterectomy with patch angioplasty.  She developed a significant wound infection and then occluded her endarterectomy site.  She went back for redo left femoral endarterectomy with patch angioplasty and angioplasty of the left external iliac stent.  This was done on 04/06/2012.  Simultaneously she underwent muscle flap closure by Dr. Migdalia Dk.  On 12/09/2012 she developed bypass graft stenosis and underwent angioplasty of the left superficial femoral artery and stenting of the left external iliac artery.  She was later found to have left external iliac occlusion and therefore on1119 12/17/2013 she underwent right to left femoral-femoral bypass graft with an 8 mm dacryon graft.  She developed in-stent stenosis and on 04/14/2014 had drug coated balloon angioplasty to her right superficial femoral artery.  The patient is here today stating that she does not have any symptoms of claudication.  She states her back bothers her before her legs do.  She denies any nonhealing wounds.  Past Medical History  Diagnosis Date  . GERD (gastroesophageal reflux disease)   . Anxiety   . CHF (congestive heart failure)   . COPD (chronic obstructive pulmonary disease)   . Depression   . Morbid obesity   . Hypertension   . Asthma   . On home oxygen therapy     "2L q hs" (12/19/2013)  . Chronic bronchitis     "get it pretty much q yr"  . OSA on CPAP     severe OSA by 09/08/06 sleep study ([facility])  . Type II diabetes mellitus   . Peripheral vascular disease     Past Surgical History  Procedure Laterality Date  . Cholecystectomy    . Roux-en-y gastric bypass  2006    . Tonsillectomy    . Femoral artery stent Right 12/05/2011    superficial   . Endarterectomy femoral  02/22/2012    Procedure: ENDARTERECTOMY FEMORAL;  Surgeon: Serafina Mitchell, MD;  Location: [place];  Service: Vascular;  Laterality: Left;  . Patch angioplasty  02/22/2012    Procedure: PATCH ANGIOPLASTY;  Surgeon: Serafina Mitchell, MD;  [facility];  Service: Vascular;  Laterality: Left;  left femoral patch angioplasty  . Application of wound vac  02/22/2012    Procedure: APPLICATION OF WOUND VAC;  Surgeon: Serafina Mitchell, MD;  Location: [place];  Service: Vascular;  Laterality: Left;  application wound vac  . Groin debridement  03/01/2012    Procedure: GROIN DEBRIDEMENT;  Surgeon: Rosetta Posner, MD;  Location: [place];  Service: Vascular;  Laterality: Left;  . Aortogram Left 04/04/2012    Procedure: AORTOGRAM;  Surgeon: Serafina Mitchell, MD;  Location: [place];  Service: Vascular;  Laterality: Left;  . Patch angioplasty Left 04/04/2012    Procedure: PATCH ANGIOPLASTY;  Surgeon: Serafina Mitchell, MD;  Location: [place];  Service: Vascular;  Laterality: Left;  Marland Kitchen Muscle flap closure Left 04/04/2012    Procedure: MUSCLE FLAP CLOSURE;  Surgeon: Theodoro Kos, DO;  Location: [place];  Service: Plastics;  Laterality: Left;  . Irrigation and debridement abscess Left 03/05/2013    Procedure: IRRIGATION AND DEBRIDEMENT ABSCESS;  Surgeon: Zenovia Jarred, MD;  Location: [place];  Service: General;  Laterality: Left;  . Breast biopsy Right ~ 2005  . Femoral artery - femoral artery bypass graft  12/17/2013  . Femoral artery stent Right 12/17/2013  . Femoral-femoral bypass graft N/A 12/17/2013    Procedure: BYPASS GRAFT FEMORAL-FEMORAL ARTERY-RIGHT TO LEFT;  Surgeon: Serafina Mitchell, MD;  Location: [place];  Service: Vascular;  Laterality: N/A;  . Insertion of iliac stent Right 12/17/2013    Procedure:  Insertion of Stent Right Superficial Femoral Artery ;  Surgeon: Serafina Mitchell, MD;  Location: [place];  Service: Vascular;   Laterality: Right;  . Lower extremity angiogram Bilateral 12/05/2011    Procedure: LOWER EXTREMITY ANGIOGRAM;  Surgeon: Serafina Mitchell, MD;  Location: [facility] CATH LAB;  Service: Cardiovascular;  Laterality: Bilateral;  . Abdominal aortagram N/A 12/09/2012    Procedure: ABDOMINAL AORTAGRAM;  Surgeon: Serafina Mitchell, MD;  Location: [facility] CATH LAB;  Service: Cardiovascular;  Laterality: N/A;  . Abdominal aortagram N/A 11/24/2013    Procedure: ABDOMINAL AORTAGRAM;  Surgeon: Serafina Mitchell, MD;  Location: [facility] CATH LAB;  Service: Cardiovascular;  Laterality: N/A;  . Abdominal aortagram N/A 04/14/2014    Procedure: ABDOMINAL AORTAGRAM;  Surgeon: Serafina Mitchell, MD;  Location: [facility] CATH LAB;  Service: Cardiovascular;  Laterality: N/A;  . Left heart catheterization with coronary angiogram N/A 04/29/2014    Procedure: LEFT HEART CATHETERIZATION WITH CORONARY ANGIOGRAM;  Surgeon: Jerline Pain, MD;  Location: [facility] CATH LAB;  Service: Cardiovascular;  Laterality: N/A;    History   Social History  . Marital Status: Married    Spouse Name: N/A  . Number of Children: N/A  . Years of Education: N/A   Occupational History  . Not on file.   Social History Main Topics  . Smoking status: Former Smoker -- 2.00 packs/day for 20 years    Types: Cigarettes    Start date: 09/30/2011    Quit date: 10/30/2011  . Smokeless tobacco: Never Used  . Alcohol Use: No  . Drug Use: No  . Sexual Activity: Not Currently    Birth Control/ Protection: Post-menopausal   Other Topics Concern  . Not on file   Social History Narrative    Family History  Problem Relation Age of Onset  . Cancer Mother   . Depression Mother   . Heart disease Father     before age 70  . Diabetes Father   . Hypertension Father   . Hyperlipidemia Father   . Heart attack Father   . Cancer Maternal Grandmother     Allergies as of 08/13/2014 - Review Complete 08/13/2014  Allergen Reaction Noted  . Celebrex [celecoxib] Shortness Of Breath  03/05/2013  . Codeine Nausea Only     Current Outpatient Prescriptions on File Prior to Visit  Medication Sig Dispense Refill  . albuterol (PROVENTIL HFA;VENTOLIN HFA) 108 (90 BASE) MCG/ACT inhaler Inhale 1 puff into the lungs every 6 (six) hours as needed for wheezing or shortness of breath.    Marland Kitchen aspirin EC 81 MG tablet Take 1 tablet (81 mg total) by mouth daily.    Marland Kitchen atorvastatin (LIPITOR) 20 MG tablet TAKE 1 TABLET BY MOUTH EVERY DAY 90 tablet 3  . BD PEN NEEDLE NANO U/F 32G X 4 MM MISC     . buPROPion (WELLBUTRIN XL) 300 MG 24 hr tablet Take 300 mg by mouth daily.    . [employer] STOOL SOFTENER 100 MG capsule Take 100  mg by mouth daily.   3  . cyclobenzaprine (FLEXERIL) 10 MG tablet Take 10 mg by mouth 2 (two) times daily as needed for muscle spasms.     . DULoxetine (CYMBALTA) 60 MG capsule Take 60 mg by mouth daily.    Marland Kitchen JANUVIA 50 MG tablet Take 50 mg by mouth daily.     Marland Kitchen losartan (COZAAR) 50 MG tablet Take 50 mg by mouth daily.    Marland Kitchen omeprazole (PRILOSEC) 40 MG capsule Take 40 mg by mouth 2 (two) times daily.    Marland Kitchen OVER THE COUNTER MEDICATION Take 630 mg by mouth daily. OTC Calcium    . oxyCODONE-acetaminophen (PERCOCET/ROXICET) 5-325 MG per tablet Take 1 tablet by mouth every 6 (six) hours as needed (pain).   0  . prasugrel (EFFIENT) 10 MG TABS tablet Take 1 tablet (10 mg total) by mouth daily. 30 tablet 11  . promethazine (PHENERGAN) 25 MG tablet Take 25 mg by mouth every 6 (six) hours as needed for nausea or vomiting.     . topiramate (TOPAMAX) 50 MG tablet Take 1 tablet (50 mg total) by mouth daily. 30 tablet 3  . Ferrous Sulfate (SLOW FE PO) Take 1 tablet by mouth daily.    . insulin aspart (NOVOLOG) 100 UNIT/ML injection Inject 42 Units into the skin at bedtime.     . insulin detemir (LEVEMIR) 100 UNIT/ML injection Inject 90 Units into the skin daily.     . Multiple Vitamins-Minerals (MULTIVITAMIN WITH MINERALS) tablet Take 1 tablet by mouth daily.    . vitamin B-12 (CYANOCOBALAMIN)  500 MCG tablet Take 500 mcg by mouth daily.     No current facility-administered medications on file prior to visit.     REVIEW OF SYSTEMS: See history of present illness otherwise negative  PHYSICAL EXAMINATION:   Vital signs are There were no vitals filed for this visit. There is no weight on file to calculate BMI. General: The patient appears their stated age. HEENT:  No gross abnormalities Pulmonary:  Non labored breathing Abdomen: Soft and non-tender Musculoskeletal: There are no major deformities. Neurologic: No focal weakness or paresthesias are detected, Skin: There are no ulcer or rashes noted. Psychiatric: The patient has normal affect. Cardiovascular: There is a regular rate and rhythm without significant murmur appreciated.  Nonpalpable pedal pulses   Diagnostic Studies I have reviewed her ultrasound today.  Ankle brachial indices are 0.64 on the right and 0.64 on the left.  These are stable.  Her duplex shows elevated velocities within the stent on the right with a peak velocity of 376.  On the left there is a native stenosis of 498.  I compare these areas to her angiogram.  Assessment: Atherosclerosis with claudication Plan: The patient appears to be doing well at this time.  I feel the most elevated velocity in the left side is in a native vessel which has never been treated.  Therefore sent she remains asymptomatic L would favor observation.  She has recently undergone balloon angioplasty of the in-stent stenosis on the right.  For that reason I would elect to monitor this and have her follow-up in 6 months.  She'll contact me if her symptoms change  V. Leia Alf, M.D. [facility] [place] Office: [phone] Pager:  [phone]   \

## 2014-08-16 ENCOUNTER — Encounter: Payer: Self-pay | Admitting: Surgery

## 2014-08-16 NOTE — Addendum Note (Signed)
Addended by: Dorthula Rue L on: 08/16/2014 10:51 AM   Modules accepted: Orders

## 2014-09-01 DIAGNOSIS — M1990 Unspecified osteoarthritis, unspecified site: Secondary | ICD-10-CM | POA: Diagnosis not present

## 2014-09-01 DIAGNOSIS — R110 Nausea: Secondary | ICD-10-CM | POA: Diagnosis not present

## 2014-09-22 DIAGNOSIS — E1121 Type 2 diabetes mellitus with diabetic nephropathy: Secondary | ICD-10-CM | POA: Diagnosis not present

## 2014-09-22 DIAGNOSIS — Z794 Long term (current) use of insulin: Secondary | ICD-10-CM | POA: Diagnosis not present

## 2014-09-22 DIAGNOSIS — N183 Chronic kidney disease, stage 3 (moderate): Secondary | ICD-10-CM | POA: Diagnosis not present

## 2014-09-22 DIAGNOSIS — E1165 Type 2 diabetes mellitus with hyperglycemia: Secondary | ICD-10-CM | POA: Diagnosis not present

## 2014-10-11 ENCOUNTER — Encounter: Payer: Self-pay | Admitting: *Deleted

## 2014-10-11 DIAGNOSIS — Z006 Encounter for examination for normal comparison and control in clinical research program: Secondary | ICD-10-CM

## 2014-10-11 NOTE — Progress Notes (Signed)
Called Stephanie Peck for her 6 month follow-up for the [facility] Registry. The patient states she has been doing well and has had no Target limb re-interventions or procedure related events. I will contact her again in 6 months.

## 2014-10-25 DIAGNOSIS — N182 Chronic kidney disease, stage 2 (mild): Secondary | ICD-10-CM | POA: Diagnosis not present

## 2014-10-25 DIAGNOSIS — I129 Hypertensive chronic kidney disease with stage 1 through stage 4 chronic kidney disease, or unspecified chronic kidney disease: Secondary | ICD-10-CM | POA: Diagnosis not present

## 2014-10-25 DIAGNOSIS — R635 Abnormal weight gain: Secondary | ICD-10-CM | POA: Diagnosis not present

## 2014-12-02 DIAGNOSIS — G8929 Other chronic pain: Secondary | ICD-10-CM | POA: Diagnosis not present

## 2014-12-02 DIAGNOSIS — G2581 Restless legs syndrome: Secondary | ICD-10-CM | POA: Diagnosis not present

## 2014-12-02 DIAGNOSIS — Z23 Encounter for immunization: Secondary | ICD-10-CM | POA: Diagnosis not present

## 2015-02-11 ENCOUNTER — Encounter: Payer: Self-pay | Admitting: Surgery

## 2015-02-21 ENCOUNTER — Ambulatory Visit (INDEPENDENT_AMBULATORY_CARE_PROVIDER_SITE_OTHER): Payer: Medicare Other | Admitting: Surgery

## 2015-02-21 ENCOUNTER — Other Ambulatory Visit: Payer: Self-pay | Admitting: Surgery

## 2015-02-21 ENCOUNTER — Ambulatory Visit (HOSPITAL_COMMUNITY)
Admission: RE | Admit: 2015-02-21 | Discharge: 2015-02-21 | Disposition: A | Discharge status: Home / Self Care | Payer: Medicare Other | Source: Ambulatory Visit | Attending: Surgery | Admitting: Surgery

## 2015-02-21 ENCOUNTER — Ambulatory Visit (INDEPENDENT_AMBULATORY_CARE_PROVIDER_SITE_OTHER)
Admission: RE | Admit: 2015-02-21 | Discharge: 2015-02-21 | Disposition: A | Discharge status: Home / Self Care | Payer: Medicare Other | Source: Ambulatory Visit | Attending: Surgery | Admitting: Surgery

## 2015-02-21 ENCOUNTER — Encounter: Payer: Self-pay | Admitting: Surgery

## 2015-02-21 VITALS — BP 148/77 | HR 105 | Ht 67.0 in | Wt 308.2 lb

## 2015-02-21 DIAGNOSIS — I739 Peripheral vascular disease, unspecified: Secondary | ICD-10-CM | POA: Diagnosis not present

## 2015-02-21 DIAGNOSIS — Z48812 Encounter for surgical aftercare following surgery on the circulatory system: Secondary | ICD-10-CM | POA: Diagnosis not present

## 2015-02-21 DIAGNOSIS — Z95828 Presence of other vascular implants and grafts: Secondary | ICD-10-CM | POA: Diagnosis not present

## 2015-02-21 DIAGNOSIS — E119 Type 2 diabetes mellitus without complications: Secondary | ICD-10-CM | POA: Diagnosis not present

## 2015-02-21 DIAGNOSIS — I10 Essential (primary) hypertension: Secondary | ICD-10-CM | POA: Diagnosis not present

## 2015-02-21 DIAGNOSIS — M792 Neuralgia and neuritis, unspecified: Secondary | ICD-10-CM

## 2015-02-21 DIAGNOSIS — I70202 Unspecified atherosclerosis of native arteries of extremities, left leg: Secondary | ICD-10-CM | POA: Insufficient documentation

## 2015-02-21 DIAGNOSIS — Z95820 Peripheral vascular angioplasty status with implants and grafts: Secondary | ICD-10-CM | POA: Insufficient documentation

## 2015-02-21 MED ORDER — GABAPENTIN 300 MG PO CAPS: 300.0000 mg | ORAL_CAPSULE | Freq: Every day | ORAL | Status: AC

## 2015-02-21 NOTE — Addendum Note (Signed)
Addended by: Mena Goes on: 02/21/2015 05:13 PM   Modules accepted: Orders

## 2015-02-21 NOTE — Progress Notes (Signed)
Patient name: Stephanie Peck MRN: [id number] DOB: 07-08-1960 Sex: female     Chief Complaint  Patient presents with  . Re-evaluation    6 month f/u     HISTORY OF PRESENT ILLNESS: The patient comes in for follow-up today. On 12/05/2011 she underwent stenting of her right superficial femoral artery. On 02/22/2012 she underwent left femoral endarterectomy with patch angioplasty. She developed a significant wound infection and then occluded her endarterectomy site. She went back for redo left femoral endarterectomy with patch angioplasty and angioplasty of the left external iliac stent. This was done on 04/06/2012. Simultaneously she underwent muscle flap closure by Dr. Migdalia Dk. On 12/09/2012 she developed bypass graft stenosis and underwent angioplasty of the left superficial femoral artery and stenting of the left external iliac artery. She was later found to have left external iliac occlusion and therefore on1119 12/17/2013 she underwent right to left femoral-femoral bypass graft with an 8 mm dacryon graft. She developed in-stent stenosis and on 04/14/2014 had drug coated balloon angioplasty to her right superficial femoral artery.   she has no complaints today.  She states her back bothers her before her legs. She denies rest pain.  Se denies ulcers. He states that her hands are now beginning to feel like her legs did before we operated on the  Past Medical History  Diagnosis Date  . GERD (gastroesophageal reflux disease)   . Anxiety   . CHF (congestive heart failure) ([place])   . COPD (chronic obstructive pulmonary disease) ([place])   . Depression   . Morbid obesity ([place])   . Hypertension   . Asthma   . On home oxygen therapy     "2L q hs" (12/19/2013)  . Chronic bronchitis ([place])     "get it pretty much q yr"  . OSA on CPAP     severe OSA by 09/08/06 sleep study ([facility])  . Type II diabetes mellitus ([place])   . Peripheral vascular disease [facility])     Past Surgical History    Procedure Laterality Date  . Cholecystectomy    . Roux-en-y gastric bypass  2006  . Tonsillectomy    . Femoral artery stent Right 12/05/2011    superficial   . Endarterectomy femoral  02/22/2012    Procedure: ENDARTERECTOMY FEMORAL;  Surgeon: Serafina Mitchell, MD;  Location: [place];  Service: Vascular;  Laterality: Left;  . Patch angioplasty  02/22/2012    Procedure: PATCH ANGIOPLASTY;  Surgeon: Serafina Mitchell, MD;  Location: [facility] OR;  Service: Vascular;  Laterality: Left;  left femoral patch angioplasty  . Application of wound vac  02/22/2012    Procedure: APPLICATION OF WOUND VAC;  Surgeon: Serafina Mitchell, MD;  Location: [place];  Service: Vascular;  Laterality: Left;  application wound vac  . Groin debridement  03/01/2012    Procedure: GROIN DEBRIDEMENT;  Surgeon: Rosetta Posner, MD;  Location: [place];  Service: Vascular;  Laterality: Left;  . Aortogram Left 04/04/2012    Procedure: AORTOGRAM;  Surgeon: Serafina Mitchell, MD;  Location: [place];  Service: Vascular;  Laterality: Left;  . Patch angioplasty Left 04/04/2012    Procedure: PATCH ANGIOPLASTY;  Surgeon: Serafina Mitchell, MD;  Location: [place];  Service: Vascular;  Laterality: Left;  Marland Kitchen Muscle flap closure Left 04/04/2012    Procedure: MUSCLE FLAP CLOSURE;  Surgeon: Theodoro Kos, DO;  Location: [place];  Service: Plastics;  Laterality: Left;  . Irrigation and debridement abscess Left 03/05/2013  Procedure: IRRIGATION AND DEBRIDEMENT ABSCESS;  Surgeon: Zenovia Jarred, MD;  Location: [place];  Service: General;  Laterality: Left;  . Breast biopsy Right ~ 2005  . Femoral artery - femoral artery bypass graft  12/17/2013  . Femoral artery stent Right 12/17/2013  . Femoral-femoral bypass graft N/A 12/17/2013    Procedure: BYPASS GRAFT FEMORAL-FEMORAL ARTERY-RIGHT TO LEFT;  Surgeon: Serafina Mitchell, MD;  Location: [place];  Service: Vascular;  Laterality: N/A;  . Insertion of iliac stent Right 12/17/2013    Procedure:  Insertion of Stent Right Superficial  Femoral Artery ;  Surgeon: Serafina Mitchell, MD;  Location: [place];  Service: Vascular;  Laterality: Right;  . Lower extremity angiogram Bilateral 12/05/2011    Procedure: LOWER EXTREMITY ANGIOGRAM;  Surgeon: Serafina Mitchell, MD;  Location: [facility] CATH LAB;  Service: Cardiovascular;  Laterality: Bilateral;  . Abdominal aortagram N/A 12/09/2012    Procedure: ABDOMINAL AORTAGRAM;  Surgeon: Serafina Mitchell, MD;  Location: [facility] CATH LAB;  Service: Cardiovascular;  Laterality: N/A;  . Abdominal aortagram N/A 11/24/2013    Procedure: ABDOMINAL AORTAGRAM;  Surgeon: Serafina Mitchell, MD;  Location: [facility] CATH LAB;  Service: Cardiovascular;  Laterality: N/A;  . Abdominal aortagram N/A 04/14/2014    Procedure: ABDOMINAL AORTAGRAM;  Surgeon: Serafina Mitchell, MD;  Location: [facility] CATH LAB;  Service: Cardiovascular;  Laterality: N/A;  . Left heart catheterization with coronary angiogram N/A 04/29/2014    Procedure: LEFT HEART CATHETERIZATION WITH CORONARY ANGIOGRAM;  Surgeon: Jerline Pain, MD;  Location: [facility] CATH LAB;  Service: Cardiovascular;  Laterality: N/A;    Social History   Social History  . Marital Status: Married    Spouse Name: N/A  . Number of Children: N/A  . Years of Education: N/A   Occupational History  . Not on file.   Social History Main Topics  . Smoking status: Former Smoker -- 2.00 packs/day for 20 years    Types: Cigarettes    Start date: 09/30/2011    Quit date: 10/30/2011  . Smokeless tobacco: Never Used  . Alcohol Use: No  . Drug Use: No  . Sexual Activity: Not Currently    Birth Control/ Protection: Post-menopausal   Other Topics Concern  . Not on file   Social History Narrative    Family History  Problem Relation Age of Onset  . Cancer Mother   . Depression Mother   . Heart disease Father     before age 71  . Diabetes Father   . Hypertension Father   . Hyperlipidemia Father   . Heart attack Father   . Cancer Maternal Grandmother     Allergies as of 02/21/2015 - Review  Complete 02/21/2015  Allergen Reaction Noted  . Celebrex [celecoxib] Shortness Of Breath 03/05/2013  . Codeine Nausea Only     Current Outpatient Prescriptions on File Prior to Visit  Medication Sig Dispense Refill  . albuterol (PROVENTIL HFA;VENTOLIN HFA) 108 (90 BASE) MCG/ACT inhaler Inhale 1 puff into the lungs every 6 (six) hours as needed for wheezing or shortness of breath.    Marland Kitchen aspirin EC 81 MG tablet Take 1 tablet (81 mg total) by mouth daily.    Marland Kitchen atorvastatin (LIPITOR) 20 MG tablet TAKE 1 TABLET BY MOUTH EVERY DAY 90 tablet 3  . BD PEN NEEDLE NANO U/F 32G X 4 MM MISC     . buPROPion (WELLBUTRIN XL) 300 MG 24 hr tablet Take 300 mg by mouth daily.    Marland Kitchen CVS  STOOL SOFTENER 100 MG capsule Take 100 mg by mouth daily.   3  . DULoxetine (CYMBALTA) 60 MG capsule Take 60 mg by mouth daily.    . Ferrous Sulfate (SLOW FE PO) Take 1 tablet by mouth daily.    . insulin detemir (LEVEMIR) 100 UNIT/ML injection Inject 90 Units into the skin daily.     Marland Kitchen losartan (COZAAR) 50 MG tablet Take 50 mg by mouth daily.    Marland Kitchen omeprazole (PRILOSEC) 40 MG capsule Take 40 mg by mouth 2 (two) times daily.    Marland Kitchen oxyCODONE-acetaminophen (PERCOCET/ROXICET) 5-325 MG per tablet Take 1 tablet by mouth every 6 (six) hours as needed (pain).   0  . prasugrel (EFFIENT) 10 MG TABS tablet Take 1 tablet (10 mg total) by mouth daily. 30 tablet 11  . promethazine (PHENERGAN) 25 MG tablet Take 25 mg by mouth every 6 (six) hours as needed for nausea or vomiting.     . topiramate (TOPAMAX) 50 MG tablet Take 1 tablet (50 mg total) by mouth daily. 30 tablet 3  . cyclobenzaprine (FLEXERIL) 10 MG tablet Take 10 mg by mouth 2 (two) times daily as needed for muscle spasms. Reported on 02/21/2015    . HUMULIN R U-500 KWIKPEN 500 UNIT/ML SOPN Reported on 02/21/2015  0  . insulin aspart (NOVOLOG) 100 UNIT/ML injection Inject 42 Units into the skin at bedtime. Reported on 02/21/2015    . JANUVIA 50 MG tablet Take 50 mg by mouth daily. Reported  on 02/21/2015    . Multiple Vitamins-Minerals (MULTIVITAMIN WITH MINERALS) tablet Take 1 tablet by mouth daily. Reported on 02/21/2015    . OVER THE COUNTER MEDICATION Take 630 mg by mouth daily. Reported on 02/21/2015    . vitamin B-12 (CYANOCOBALAMIN) 500 MCG tablet Take 500 mcg by mouth daily. Reported on 02/21/2015     No current facility-administered medications on file prior to visit.     REVIEW OF SYSTEMS: Cardiovascular: No chest pain,   Pulmonary: No productive cough, asthma or wheezing. Neurologic: No weakness, paresthesias, aphasia, or amaurosis. No dizziness. Hematologic: No bleeding problems or clotting disorders. Musculoskeletal: positive back pain Gastrointestinal: No blood in stool or hematemesis Genitourinary: No dysuria or hematuria. Integumentary: No rashes or ulcers. Constitutional: No fever or chills.  PHYSICAL EXAMINATION:   Vital signs are  Filed Vitals:   02/21/15 1446  BP: 148/77  Pulse: 105  Height: 5\' 7"  (1.702 m)  Weight: 308 lb 3.2 oz (139.799 kg)  SpO2: 95%   Body mass index is 48.26 kg/(m^2). General: The patient appears their stated age. HEENT:  No gross abnormalities Pulmonary:  Non labored breathing Musculoskeletal: There are no major deformities. Neurologic: No focal weakness or paresthesias are detected, Skin: There are no ulcer or rashes noted. Psychiatric: The patient has normal affect. Cardiovascular: There is a regular rate and rhythm without significant murmur appreciated. Extremities are warm and well perfused.  Brisk Doppler signals in bilateral radial arteries   Diagnostic Studies  I have ordered and reviewed her Doppler studies.  Her ABI is 0.73 on the right and 0.71 on the left.  Previously both were 0.64.   I have reviewed her duplex.  Her femoral-femoral bypass graft is widely patent.  There are elevated velocities just distal to the right superficial femoral artery stent and within the proximal left superficial femoral  artery  Assessment:   #1:atherosclerosis with claudication   #2:Peripheral neuropathy Plan:  #1: the patient's duplex ultrasound is stable.  I have recommended follow-up  in 6 months with attention towards the superficial femoral arteries bilaterally. #2: I will start the patient on Neurontin 300 mg daily at bedtime.  I do not think this is vascular in etiology as she has brisk triphasic waveforms by Doppler hand-held ultrasound in both radial arteries.  Eldridge Abrahams, M.D. [facility] [place] Office: [phone] Pager:  [phone]

## 2015-03-15 DIAGNOSIS — G43109 Migraine with aura, not intractable, without status migrainosus: Secondary | ICD-10-CM | POA: Diagnosis not present

## 2015-03-15 DIAGNOSIS — N183 Chronic kidney disease, stage 3 (moderate): Secondary | ICD-10-CM | POA: Diagnosis not present

## 2015-03-15 DIAGNOSIS — I10 Essential (primary) hypertension: Secondary | ICD-10-CM | POA: Diagnosis not present

## 2015-03-15 DIAGNOSIS — E1122 Type 2 diabetes mellitus with diabetic chronic kidney disease: Secondary | ICD-10-CM | POA: Diagnosis not present

## 2015-03-15 DIAGNOSIS — G8929 Other chronic pain: Secondary | ICD-10-CM | POA: Diagnosis not present

## 2015-03-15 DIAGNOSIS — I739 Peripheral vascular disease, unspecified: Secondary | ICD-10-CM | POA: Diagnosis not present

## 2015-03-15 DIAGNOSIS — J449 Chronic obstructive pulmonary disease, unspecified: Secondary | ICD-10-CM | POA: Diagnosis not present

## 2015-03-15 DIAGNOSIS — E1151 Type 2 diabetes mellitus with diabetic peripheral angiopathy without gangrene: Secondary | ICD-10-CM | POA: Diagnosis not present

## 2015-03-15 DIAGNOSIS — G4733 Obstructive sleep apnea (adult) (pediatric): Secondary | ICD-10-CM | POA: Diagnosis not present

## 2015-03-15 DIAGNOSIS — Z794 Long term (current) use of insulin: Secondary | ICD-10-CM | POA: Diagnosis not present

## 2015-03-15 DIAGNOSIS — E782 Mixed hyperlipidemia: Secondary | ICD-10-CM | POA: Diagnosis not present

## 2015-03-21 DIAGNOSIS — E119 Type 2 diabetes mellitus without complications: Secondary | ICD-10-CM | POA: Diagnosis not present

## 2015-03-21 DIAGNOSIS — H353131 Nonexudative age-related macular degeneration, bilateral, early dry stage: Secondary | ICD-10-CM | POA: Diagnosis not present

## 2015-03-21 DIAGNOSIS — H2513 Age-related nuclear cataract, bilateral: Secondary | ICD-10-CM | POA: Diagnosis not present

## 2015-04-21 ENCOUNTER — Other Ambulatory Visit: Payer: Self-pay | Admitting: Nurse Practitioner

## 2015-04-21 ENCOUNTER — Other Ambulatory Visit (HOSPITAL_COMMUNITY)
Admission: RE | Admit: 2015-04-21 | Discharge: 2015-04-21 | Disposition: A | Discharge status: Home / Self Care | Payer: Medicare Other | Source: Ambulatory Visit | Attending: Nurse Practitioner | Admitting: Nurse Practitioner

## 2015-04-21 DIAGNOSIS — N950 Postmenopausal bleeding: Secondary | ICD-10-CM | POA: Diagnosis not present

## 2015-04-21 DIAGNOSIS — Z1151 Encounter for screening for human papillomavirus (HPV): Secondary | ICD-10-CM | POA: Diagnosis not present

## 2015-04-21 DIAGNOSIS — N840 Polyp of corpus uteri: Secondary | ICD-10-CM | POA: Diagnosis not present

## 2015-04-21 DIAGNOSIS — Z01419 Encounter for gynecological examination (general) (routine) without abnormal findings: Secondary | ICD-10-CM | POA: Diagnosis not present

## 2015-04-22 LAB — CYTOLOGY - PAP

## 2015-04-25 DIAGNOSIS — N182 Chronic kidney disease, stage 2 (mild): Secondary | ICD-10-CM | POA: Diagnosis not present

## 2015-04-25 DIAGNOSIS — I129 Hypertensive chronic kidney disease with stage 1 through stage 4 chronic kidney disease, or unspecified chronic kidney disease: Secondary | ICD-10-CM | POA: Diagnosis not present

## 2015-04-25 DIAGNOSIS — R635 Abnormal weight gain: Secondary | ICD-10-CM | POA: Diagnosis not present

## 2015-05-10 DIAGNOSIS — N950 Postmenopausal bleeding: Secondary | ICD-10-CM | POA: Diagnosis not present

## 2015-05-26 DIAGNOSIS — Z794 Long term (current) use of insulin: Secondary | ICD-10-CM | POA: Diagnosis not present

## 2015-05-26 DIAGNOSIS — E1121 Type 2 diabetes mellitus with diabetic nephropathy: Secondary | ICD-10-CM | POA: Diagnosis not present

## 2015-05-26 DIAGNOSIS — N183 Chronic kidney disease, stage 3 (moderate): Secondary | ICD-10-CM | POA: Diagnosis not present

## 2015-05-31 DIAGNOSIS — N950 Postmenopausal bleeding: Secondary | ICD-10-CM | POA: Diagnosis not present

## 2015-06-13 DIAGNOSIS — T7840XA Allergy, unspecified, initial encounter: Secondary | ICD-10-CM | POA: Diagnosis not present

## 2015-06-21 ENCOUNTER — Other Ambulatory Visit: Payer: Self-pay | Admitting: Cardiology

## 2015-07-12 DIAGNOSIS — Z01818 Encounter for other preprocedural examination: Secondary | ICD-10-CM | POA: Diagnosis not present

## 2015-07-12 DIAGNOSIS — N950 Postmenopausal bleeding: Secondary | ICD-10-CM | POA: Diagnosis not present

## 2015-07-12 DIAGNOSIS — N9489 Other specified conditions associated with female genital organs and menstrual cycle: Secondary | ICD-10-CM | POA: Diagnosis not present

## 2015-07-12 NOTE — Patient Instructions (Addendum)
Your procedure is scheduled on:  Wednesday, June 28  Enter through the [employer] [facility] at: 1:15pm  Pick up the phone at the desk and dial [phone].  Call this number if you have problems the morning of surgery: [phone].  Remember: Do NOT eat food: midnight Tuesday, 6/27  Do NOT drink clear liquids after: 10:45am  Take these medicines the morning of surgery with a SIP OF WATER:  Lipitor, wellbutrin xl, cymbalta, gabapentin, losartan,  Omeprazole, Pramipexole, topamax.  Do not take any diabetes medication on Wednesday, 6/28 morning.  We will check your blood sugar and treat if needed. Half your Tuesday evening dose of Humulin (50 units - take 25  Units).      Do NOT wear jewelry (body piercing), metal hair clips/bobby pins, make-up, or nail polish. Do NOT wear lotions, powders, or perfumes.  You may wear deoderant. Do NOT shave for 48 hours prior to surgery. Do NOT bring valuables to the hospital. Contacts, dentures, or bridgework may not be worn into surgery.  Have a responsible adult drive you home and stay with you for 24 hours after your procedure

## 2015-07-13 ENCOUNTER — Inpatient Hospital Stay (HOSPITAL_COMMUNITY)
Admission: RE | Admit: 2015-07-13 | Discharge: 2015-07-13 | Disposition: A | Discharge status: Home / Self Care | Payer: Medicare Other | Source: Ambulatory Visit

## 2015-07-19 ENCOUNTER — Other Ambulatory Visit: Payer: Self-pay

## 2015-07-19 ENCOUNTER — Encounter (HOSPITAL_COMMUNITY): Payer: Self-pay

## 2015-07-19 ENCOUNTER — Encounter (HOSPITAL_COMMUNITY)
Admission: RE | Admit: 2015-07-19 | Discharge: 2015-07-19 | Disposition: A | Discharge status: Home / Self Care | Payer: Medicare Other | Source: Ambulatory Visit | Attending: Obstetrics and Gynecology | Admitting: Obstetrics and Gynecology

## 2015-07-19 DIAGNOSIS — Z01812 Encounter for preprocedural laboratory examination: Secondary | ICD-10-CM | POA: Insufficient documentation

## 2015-07-19 DIAGNOSIS — Z01810 Encounter for preprocedural cardiovascular examination: Secondary | ICD-10-CM | POA: Diagnosis not present

## 2015-07-19 HISTORY — DX: Other chronic pain: G89.29

## 2015-07-19 HISTORY — DX: Headache, unspecified: R51.9

## 2015-07-19 HISTORY — DX: Long term (current) use of anticoagulants: Z79.01

## 2015-07-19 HISTORY — DX: Other intervertebral disc degeneration, lumbar region: M51.36

## 2015-07-19 HISTORY — DX: Other intervertebral disc degeneration, lumbar region without mention of lumbar back pain or lower extremity pain: M51.369

## 2015-07-19 HISTORY — DX: Chronic kidney disease, unspecified: N18.9

## 2015-07-19 HISTORY — DX: Personal history of other drug therapy: Z92.29

## 2015-07-19 HISTORY — DX: Dorsalgia, unspecified: M54.9

## 2015-07-19 HISTORY — DX: Heart disease, unspecified: I51.9

## 2015-07-19 HISTORY — DX: Headache: R51

## 2015-07-19 HISTORY — DX: Rash and other nonspecific skin eruption: R21

## 2015-07-19 LAB — CBC
HCT: 36 % (ref 36.0–46.0)
Hemoglobin: 11 g/dL — ABNORMAL LOW (ref 12.0–15.0)
MCH: 24.4 pg — ABNORMAL LOW (ref 26.0–34.0)
MCHC: 30.6 g/dL (ref 30.0–36.0)
MCV: 80 fL (ref 78.0–100.0)
PLATELETS: 599 10*3/uL — AB (ref 150–400)
RBC: 4.5 MIL/uL (ref 3.87–5.11)
RDW: 17.1 % — ABNORMAL HIGH (ref 11.5–15.5)
WBC: 12 10*3/uL — AB (ref 4.0–10.5)

## 2015-07-19 LAB — BASIC METABOLIC PANEL
ANION GAP: 8 (ref 5–15)
BUN: 23 mg/dL — ABNORMAL HIGH (ref 6–20)
CO2: 23 mmol/L (ref 22–32)
Calcium: 8.7 mg/dL — ABNORMAL LOW (ref 8.9–10.3)
Chloride: 105 mmol/L (ref 101–111)
Creatinine, Ser: 1.48 mg/dL — ABNORMAL HIGH (ref 0.44–1.00)
GFR calc Af Amer: 45 mL/min — ABNORMAL LOW (ref >60)
GFR, EST NON AFRICAN AMERICAN: 38 mL/min — AB (ref >60)
Glucose, Bld: 62 mg/dL — ABNORMAL LOW (ref 65–99)
POTASSIUM: 3.8 mmol/L (ref 3.5–5.1)
SODIUM: 136 mmol/L (ref 135–145)

## 2015-07-19 NOTE — Pre-Procedure Instructions (Signed)
Dr. Delma Post made aware of Stephanie Peck's lab results (glucose) no new orders received at this time.

## 2015-07-19 NOTE — Patient Instructions (Addendum)
Your procedure is scheduled on:  Wednesday, July 27, 2015  Enter through the [employer] [facility] at:  1:15 PM  Pick up the phone at the desk and dial [phone].  Call this number if you have problems the morning of surgery: [phone].  Remember: Do NOT eat food:  After Midnight Tuesday, July 26, 2015  Do NOT drink clear liquids after:  10:30 AM day of surgery  Take these medicines the morning of surgery with a SIP OF WATER:  Losartan, Omeprazole, Topamax, Wellbutrin  Speak with physician about when to stop blood thinners.  Bring CPAP machine to hospital day of surgery.  Do NOT smoke day of surgery. Do NOT wear jewelry (body piercing), metal hair clips/bobby pins, make-up, or nail polish. Do NOT wear lotions, powders, or perfumes.  You may wear deodorant. Do NOT shave for 48 hours prior to surgery. Do NOT bring valuables to the hospital. Contacts, dentures, or bridgework may not be worn into surgery.  Have a responsible adult drive you home and stay with you for 24 hours after your procedure  Bring a copy of living will day of surgery

## 2015-07-19 NOTE — Pre-Procedure Instructions (Signed)
Dr. Delma Post reviewed Stephanie Peck's EKG and medical history.  He believes patient will be fine for this procedure.  I will request medical clearance from her primary care, cardiologist, and vascular physician prior to this surgery.

## 2015-07-21 ENCOUNTER — Other Ambulatory Visit: Payer: Self-pay | Admitting: Cardiology

## 2015-07-21 ENCOUNTER — Telehealth: Payer: Self-pay

## 2015-07-21 NOTE — Telephone Encounter (Signed)
Pt. called office.  Reported she is having a D & C, and removal of endometrial mass 07/27/15, at [facility].  Reported she was advised to check with Dr. Trula Slade re: holding Effient, prior to procedure.  Discussed with Dr. Trula Slade.  Advised to continue to take ASA, and may hold Effient starting today, prior to procedure on 6/28; pt. Should resume Effient as soon as safe, following the procedure.  Pt. Notified of Dr. Stephens Shire recommendations.

## 2015-07-22 ENCOUNTER — Telehealth: Payer: Self-pay | Admitting: Cardiology

## 2015-07-22 NOTE — Telephone Encounter (Signed)
Thank you - will place this information on her paperwork to be reviewed.

## 2015-07-22 NOTE — Telephone Encounter (Signed)
Follow-up     Returning Pam's call requesting the pt's date of birth [birth year], and the medical records # [id number]

## 2015-07-26 ENCOUNTER — Telehealth: Payer: Self-pay

## 2015-07-26 MED ORDER — DEXTROSE 5 % IV SOLN
3.0000 g | INTRAVENOUS | Status: AC
  Administered 2015-07-27: 3 g via INTRAVENOUS
  Filled 2015-07-26: qty 3000

## 2015-07-26 NOTE — Telephone Encounter (Addendum)
Phone call rec'd from nurse at [facility], [place], with request for vascular clearance for pt.; scheduled for Dilatation and Curettage, endometrial mass removal via myosure or other device on Wed., 07/27/15.  Paged Dr. Trula Slade.  Reviewed pt's vascular surgery history with him; stated he is well acquainted with the pt., and based on her last evaluation on 02/21/15, recommended that there are no Vascular Surgery contraindications to proceeding with the D & C and removal of endometrial mass.  Notified nurse at [facility] Pre-op Dept. Of clearance.

## 2015-07-27 ENCOUNTER — Ambulatory Visit (HOSPITAL_COMMUNITY)
Admission: RE | Admit: 2015-07-27 | Discharge: 2015-07-27 | Disposition: A | Discharge status: Home / Self Care | Payer: Medicare Other | Source: Ambulatory Visit | Attending: Obstetrics and Gynecology | Admitting: Obstetrics and Gynecology

## 2015-07-27 ENCOUNTER — Ambulatory Visit (HOSPITAL_COMMUNITY): Payer: Medicare Other | Admitting: Anesthesiology

## 2015-07-27 ENCOUNTER — Encounter (HOSPITAL_COMMUNITY)
Admission: RE | Disposition: A | Discharge status: Home / Self Care | Payer: Self-pay | Source: Ambulatory Visit | Attending: Obstetrics and Gynecology

## 2015-07-27 ENCOUNTER — Encounter (HOSPITAL_COMMUNITY): Payer: Self-pay | Admitting: *Deleted

## 2015-07-27 DIAGNOSIS — F17210 Nicotine dependence, cigarettes, uncomplicated: Secondary | ICD-10-CM | POA: Insufficient documentation

## 2015-07-27 DIAGNOSIS — M109 Gout, unspecified: Secondary | ICD-10-CM | POA: Diagnosis not present

## 2015-07-27 DIAGNOSIS — G4730 Sleep apnea, unspecified: Secondary | ICD-10-CM | POA: Diagnosis not present

## 2015-07-27 DIAGNOSIS — I509 Heart failure, unspecified: Secondary | ICD-10-CM | POA: Diagnosis not present

## 2015-07-27 DIAGNOSIS — N289 Disorder of kidney and ureter, unspecified: Secondary | ICD-10-CM | POA: Insufficient documentation

## 2015-07-27 DIAGNOSIS — J449 Chronic obstructive pulmonary disease, unspecified: Secondary | ICD-10-CM | POA: Diagnosis not present

## 2015-07-27 DIAGNOSIS — Z6841 Body Mass Index (BMI) 40.0 and over, adult: Secondary | ICD-10-CM | POA: Diagnosis not present

## 2015-07-27 DIAGNOSIS — E119 Type 2 diabetes mellitus without complications: Secondary | ICD-10-CM | POA: Insufficient documentation

## 2015-07-27 DIAGNOSIS — K219 Gastro-esophageal reflux disease without esophagitis: Secondary | ICD-10-CM | POA: Insufficient documentation

## 2015-07-27 DIAGNOSIS — N950 Postmenopausal bleeding: Secondary | ICD-10-CM | POA: Diagnosis not present

## 2015-07-27 DIAGNOSIS — I10 Essential (primary) hypertension: Secondary | ICD-10-CM | POA: Diagnosis not present

## 2015-07-27 DIAGNOSIS — I739 Peripheral vascular disease, unspecified: Secondary | ICD-10-CM | POA: Diagnosis not present

## 2015-07-27 DIAGNOSIS — K5792 Diverticulitis of intestine, part unspecified, without perforation or abscess without bleeding: Secondary | ICD-10-CM | POA: Diagnosis not present

## 2015-07-27 DIAGNOSIS — Z9889 Other specified postprocedural states: Secondary | ICD-10-CM

## 2015-07-27 DIAGNOSIS — N840 Polyp of corpus uteri: Secondary | ICD-10-CM | POA: Insufficient documentation

## 2015-07-27 HISTORY — PX: DILATATION & CURETTAGE/HYSTEROSCOPY WITH MYOSURE: SHX6511

## 2015-07-27 LAB — GLUCOSE, CAPILLARY
GLUCOSE-CAPILLARY: 161 mg/dL — AB (ref 65–99)
Glucose-Capillary: 154 mg/dL — ABNORMAL HIGH (ref 65–99)

## 2015-07-27 SURGERY — DILATATION & CURETTAGE/HYSTEROSCOPY WITH MYOSURE
Anesthesia: General

## 2015-07-27 MED ORDER — SODIUM CHLORIDE 0.9 % IV SOLN
INTRAVENOUS | Status: DC
  Administered 2015-07-27: 14:00:00 via INTRAVENOUS

## 2015-07-27 MED ORDER — FENTANYL CITRATE (PF) 250 MCG/5ML IJ SOLN
INTRAMUSCULAR | Status: AC
  Filled 2015-07-27: qty 5

## 2015-07-27 MED ORDER — ACETAMINOPHEN 500 MG PO TABS: 500.0000 mg | ORAL_TABLET | Freq: Three times a day (TID) | ORAL | Status: DC | PRN

## 2015-07-27 MED ORDER — PROPOFOL 10 MG/ML IV BOLUS
INTRAVENOUS | Status: AC
  Filled 2015-07-27: qty 20

## 2015-07-27 MED ORDER — FENTANYL CITRATE (PF) 100 MCG/2ML IJ SOLN
25.0000 ug | INTRAMUSCULAR | Status: DC | PRN
  Administered 2015-07-27: 50 ug via INTRAVENOUS

## 2015-07-27 MED ORDER — FENTANYL CITRATE (PF) 100 MCG/2ML IJ SOLN
INTRAMUSCULAR | Status: AC
  Filled 2015-07-27: qty 2

## 2015-07-27 MED ORDER — ACETAMINOPHEN 500 MG PO TABS: 500.0000 mg | ORAL_TABLET | Freq: Three times a day (TID) | ORAL | Status: AC | PRN

## 2015-07-27 MED ORDER — LIDOCAINE HCL 2 % IJ SOLN
INTRAMUSCULAR | Status: AC
  Filled 2015-07-27: qty 20

## 2015-07-27 MED ORDER — FENTANYL CITRATE (PF) 100 MCG/2ML IJ SOLN
INTRAMUSCULAR | Status: DC | PRN
  Administered 2015-07-27: 100 ug via INTRAVENOUS
  Administered 2015-07-27 (×2): 25 ug via INTRAVENOUS

## 2015-07-27 MED ORDER — MIDAZOLAM HCL 2 MG/2ML IJ SOLN
INTRAMUSCULAR | Status: AC
  Filled 2015-07-27: qty 2

## 2015-07-27 MED ORDER — IBUPROFEN 600 MG PO TABS: 600.0000 mg | ORAL_TABLET | Freq: Four times a day (QID) | ORAL | Status: DC | PRN

## 2015-07-27 MED ORDER — SUCCINYLCHOLINE CHLORIDE 200 MG/10ML IV SOSY
PREFILLED_SYRINGE | INTRAVENOUS | Status: DC | PRN
  Administered 2015-07-27: 170 mg via INTRAVENOUS

## 2015-07-27 MED ORDER — ALBUTEROL SULFATE HFA 108 (90 BASE) MCG/ACT IN AERS
INHALATION_SPRAY | RESPIRATORY_TRACT | Status: DC | PRN
  Administered 2015-07-27: 2 via RESPIRATORY_TRACT

## 2015-07-27 MED ORDER — SODIUM CHLORIDE 0.9 % IV SOLN
INTRAVENOUS | Status: DC
  Administered 2015-07-27: 17:00:00 via INTRAVENOUS

## 2015-07-27 MED ORDER — MEPERIDINE HCL 25 MG/ML IJ SOLN: 6.2500 mg | INTRAMUSCULAR | Status: DC | PRN

## 2015-07-27 MED ORDER — SILVER NITRATE-POT NITRATE 75-25 % EX MISC
CUTANEOUS | Status: DC | PRN
  Administered 2015-07-27: 1

## 2015-07-27 MED ORDER — LACTATED RINGERS IV SOLN: INTRAVENOUS | Status: DC

## 2015-07-27 MED ORDER — LIDOCAINE HCL (CARDIAC) 20 MG/ML IV SOLN
INTRAVENOUS | Status: AC
  Filled 2015-07-27: qty 5

## 2015-07-27 MED ORDER — ONDANSETRON HCL 4 MG/2ML IJ SOLN
INTRAMUSCULAR | Status: AC
Start: 2015-07-27 — End: 2015-07-27
  Filled 2015-07-27: qty 2

## 2015-07-27 MED ORDER — METOCLOPRAMIDE HCL 5 MG/ML IJ SOLN: 10.0000 mg | Freq: Once | INTRAMUSCULAR | Status: DC | PRN

## 2015-07-27 MED ORDER — SODIUM CHLORIDE 0.9 % IR SOLN
Status: DC | PRN
  Administered 2015-07-27: 3000 mL

## 2015-07-27 MED ORDER — ALBUTEROL SULFATE HFA 108 (90 BASE) MCG/ACT IN AERS
INHALATION_SPRAY | RESPIRATORY_TRACT | Status: AC
  Filled 2015-07-27: qty 6.7

## 2015-07-27 MED ORDER — LACTATED RINGERS IV SOLN
INTRAVENOUS | Status: DC
  Administered 2015-07-27: 14:00:00 via INTRAVENOUS

## 2015-07-27 MED ORDER — LIDOCAINE HCL 2 % IJ SOLN
INTRAMUSCULAR | Status: DC | PRN
  Administered 2015-07-27: 10 mL

## 2015-07-27 MED ORDER — PROPOFOL 10 MG/ML IV BOLUS
INTRAVENOUS | Status: DC | PRN
  Administered 2015-07-27: 180 mg via INTRAVENOUS
  Administered 2015-07-27: 30 mg via INTRAVENOUS
  Administered 2015-07-27: 20 mg via INTRAVENOUS
  Administered 2015-07-27: 50 mg via INTRAVENOUS

## 2015-07-27 MED ORDER — SUCCINYLCHOLINE CHLORIDE 20 MG/ML IJ SOLN
INTRAMUSCULAR | Status: AC
  Filled 2015-07-27: qty 1

## 2015-07-27 MED ORDER — ONDANSETRON HCL 4 MG/2ML IJ SOLN
INTRAMUSCULAR | Status: DC | PRN
  Administered 2015-07-27: 4 mg via INTRAVENOUS

## 2015-07-27 MED ORDER — LIDOCAINE HCL (CARDIAC) 20 MG/ML IV SOLN
INTRAVENOUS | Status: DC | PRN
  Administered 2015-07-27: 80 mg via INTRAVENOUS

## 2015-07-27 SURGICAL SUPPLY — 23 items
CANISTER SUCT 3000ML (MISCELLANEOUS) ×3 IMPLANT
CATH ROBINSON RED A/P 16FR (CATHETERS) ×3 IMPLANT
CLOTH BEACON ORANGE TIMEOUT ST (SAFETY) ×3 IMPLANT
CONTAINER PREFILL 10% NBF 60ML (FORM) ×6 IMPLANT
DEVICE MYOSURE LITE (MISCELLANEOUS) IMPLANT
DEVICE MYOSURE REACH (MISCELLANEOUS) IMPLANT
DILATOR CANAL MILEX (MISCELLANEOUS) ×2 IMPLANT
FILTER ARTHROSCOPY CONVERTOR (FILTER) ×3 IMPLANT
GLOVE BIO SURGEON STRL SZ7 (GLOVE) ×3 IMPLANT
GLOVE BIOGEL M 7.0 STRL (GLOVE) ×4 IMPLANT
GLOVE BIOGEL PI IND STRL 7.0 (GLOVE) ×2 IMPLANT
GLOVE BIOGEL PI IND STRL 7.5 (GLOVE) IMPLANT
GLOVE BIOGEL PI INDICATOR 7.0 (GLOVE) ×4
GLOVE BIOGEL PI INDICATOR 7.5 (GLOVE) ×4
GOWN STRL REUS W/TWL LRG LVL3 (GOWN DISPOSABLE) ×6 IMPLANT
MYOSURE LITE POLYP REMOVAL (MISCELLANEOUS) ×2 IMPLANT
PACK VAGINAL MINOR WOMEN LF (CUSTOM PROCEDURE TRAY) ×3 IMPLANT
PAD OB MATERNITY 4.3X12.25 (PERSONAL CARE ITEMS) ×3 IMPLANT
SEAL ROD LENS SCOPE MYOSURE (ABLATOR) ×3 IMPLANT
TOWEL OR 17X24 6PK STRL BLUE (TOWEL DISPOSABLE) ×6 IMPLANT
TUBING AQUILEX INFLOW (TUBING) ×3 IMPLANT
TUBING AQUILEX OUTFLOW (TUBING) ×3 IMPLANT
WATER STERILE IRR 1000ML POUR (IV SOLUTION) ×3 IMPLANT

## 2015-07-27 NOTE — Discharge Instructions (Addendum)
Dilation and Curettage or Vacuum Curettage, Care After Refer to this sheet in the next few weeks. These instructions provide you with information on caring for yourself after your procedure. Your health care provider may also give you more specific instructions. Your treatment has been planned according to current medical practices, but problems sometimes occur. Call your health care provider if you have any problems or questions after your procedure. WHAT TO EXPECT AFTER THE PROCEDURE After your procedure, it is typical to have light cramping and bleeding. This may last for 2 days to 2 weeks after the procedure. HOME CARE INSTRUCTIONS   Do not drive for 24 hours.  Wait 1 week before returning to strenuous activities.  Take your temperature 2 times a day for 4 days and write it down. Provide these temperatures to your health care provider if you develop a fever.  Avoid long periods of standing.  Avoid heavy lifting, pushing, or pulling. Do not lift anything heavier than 10 pounds (4.5 kg).  Limit stair climbing to once or twice a day.  Take rest periods often.  You may resume your usual diet.  Drink enough fluids to keep your urine clear or pale yellow.  Your usual bowel function should return. If you have constipation, you may:  Take a mild laxative with permission from your health care provider.  Add fruit and bran to your diet.  Drink more fluids.  Take showers instead of baths until your health care provider gives you permission to take baths.  Do not go swimming or use a hot tub until your health care provider approves.  Try to have someone with you or available to you the first 24-48 hours, especially if you were given a general anesthetic.  Do not douche, use tampons, or have sex (intercourse) for 2 weeks after the procedure.  Only take over-the-counter or prescription medicines as directed by your health care provider. Do not take aspirin. It can cause  bleeding.  Follow up with your health care provider as directed. SEEK MEDICAL CARE IF:   You have increasing cramps or pain that is not relieved with medicine.  You have abdominal pain that does not seem to be related to the same area of earlier cramping and pain.  You have bad smelling vaginal discharge.  You have a rash.  You are having problems with any medicine. SEEK IMMEDIATE MEDICAL CARE IF:   You have bleeding that is heavier than a normal menstrual period.  You have a fever.  You have chest pain.  You have shortness of breath.  You feel dizzy or feel like fainting.  You pass out.  You have pain in your shoulder strap area.  You have heavy vaginal bleeding with or without blood clots. MAKE SURE YOU:   Understand these instructions.  Will watch your condition.  Will get help right away if you are not doing well or get worse.   This information is not intended to replace advice given to you by your health care provider. Make sure you discuss any questions you have with your health care provider.   Document Released: 01/13/2000 Document Revised: 01/20/2013 Document Reviewed: 08/14/2012 Elsevier Interactive Patient Education 2016 [place] INSTRUCTIONS: HYSTEROSCOPY / ENDOMETRIAL ABLATION The following instructions have been prepared to help you care for yourself upon your return home.  May Remove Scop patch on or before  May take Ibuprofen after  May take stool softner while taking narcotic pain medication to prevent constipation.  Drink plenty  of water.  Personal hygiene:  Use sanitary pads for vaginal drainage, not tampons.  Shower the day after your procedure.  NO tub baths, pools or Jacuzzis for 2-3 weeks.  Wipe front to back after using the bathroom.  Activity and limitations:  Do NOT drive or operate any equipment for 24 hours. The effects of anesthesia are still present and drowsiness may result.  Do NOT rest in bed all  day.  Walking is encouraged.  Walk up and down stairs slowly.  You may resume your normal activity in one to two days or as indicated by your physician. Sexual activity: NO intercourse for at least 2 weeks after the procedure, or as indicated by your Doctor.  Diet: Eat a light meal as desired this evening. You may resume your usual diet tomorrow.  Return to Work: You may resume your work activities in one to two days or as indicated by Marine scientist.  What to expect after your surgery: Expect to have vaginal bleeding/discharge for 2-3 days and spotting for up to 10 days. It is not unusual to have soreness for up to 1-2 weeks. You may have a slight burning sensation when you urinate for the first day. Mild cramps may continue for a couple of days. You may have a regular period in 2-6 weeks.  Call your doctor for any of the following:  Excessive vaginal bleeding or clotting, saturating and changing one pad every hour.  Inability to urinate 6 hours after discharge from hospital.  Pain not relieved by pain medication.  Fever of 100.4 F or greater.  Unusual vaginal discharge or odor.  Return to office _________________Call for an appointment ___________________ Patients signature: ______________________ Nurses signature ________________________  [facility] [phone]

## 2015-07-27 NOTE — Brief Op Note (Signed)
07/27/2015  4:47 PM  PATIENT:  Stephanie Peck  55 y.o. female  PRE-OPERATIVE DIAGNOSIS:  N95.0 PMB, endometrial masses  POST-OPERATIVE DIAGNOSIS:  endometrial polyps  PROCEDURE:  Procedure(s): DILATATION & CURETTAGE/HYSTEROSCOPY WITH MYOSURE (N/A)  SURGEON:  Surgeon(s) and Role:    * Thurnell Lose, MD - Primary  PHYSICIAN ASSISTANT: None  ASSISTANTS: Technician   ANESTHESIA:   local and general  EBL:  Total I/O In: 500 [I.V.:500] Out: 130 [Urine:120; Blood:10] Hysteroscopy deficit 275  BLOOD ADMINISTERED:none  DRAINS: none   LOCAL MEDICATIONS USED:  LIDOCAINE   SPECIMEN:  Source of Specimen:  Endometrial polyps, endometrial currettings  DISPOSITION OF SPECIMEN:  PATHOLOGY  COUNTS:  YES   TOURNIQUET:  * No tourniquets in log *  DICTATION: .Other Dictation: Dictation Number [id number]  PLAN OF CARE: Discharge to home after PACU  PATIENT DISPOSITION:  PACU - hemodynamically stable.   Delay start of Pharmacological VTE agent (>24hrs) due to surgical blood loss or risk of bleeding: yes

## 2015-07-27 NOTE — Transfer of Care (Signed)
Immediate Anesthesia Transfer of Care Note  Patient: Stephanie Peck  Procedure(s) Performed: Procedure(s): DILATATION & CURETTAGE/HYSTEROSCOPY WITH MYOSURE (N/A)  Patient Location: PACU  Anesthesia Type:General  Level of Consciousness: awake  Airway & Oxygen Therapy: Patient Spontanous Breathing  Post-op Assessment: Report given to PACU RN  Post vital signs: stable  Filed Vitals:   07/27/15 1330  BP: 146/68  Pulse: 89  Temp: 36.8 C  Resp: 20    Complications: No apparent anesthesia complications

## 2015-07-27 NOTE — Op Note (Signed)
NAMEVICTORIALYNN, Stephanie Peck                ACCOUNT NO.:  [id number]  MEDICAL RECORD NO.:  [id number]  LOCATION:  [facility]                          FACILITY:  [facility]  PHYSICIAN:  Jola Schmidt, MD   DATE OF BIRTH:  06-04-1960  DATE OF PROCEDURE:  07/27/2015 DATE OF DISCHARGE:  07/27/2015                              OPERATIVE REPORT   PREOPERATIVE DIAGNOSIS:  Postmenopausal bleeding and endometrial masses.  POSTOPERATIVE DIAGNOSIS:  Endometrial polyps.  PROCEDURE:  Hysteroscopy D and C with resection of polyps with MyoSure.  SURGEON:  Jola Schmidt, MD.  PHYSICIAN ASSISTANT:  None.  Technician as Environmental consultant.  ANESTHESIA:  Local and general.  BLOOD:  10.  URINE OUTPUT:  120.  HYSTEROSCOPY DEFICIT:  275.  DRAINS:  None.  LOCAL SEDATION:  10 mL of 1% lidocaine.  SPECIMEN:  Endometrial polyps and endometrial curettings.  Disposition of specimen to Pathology.  DISPOSITION:  To PACU hemodynamically stable, extubated in OR.  COMPLICATIONS:  None.  FINDINGS:  Polypoid mass is noted.  Otherwise, normal endometrial cavity.  Some redundant tissue on the posterior portion of the uterus.  DESCRIPTION OF PROCEDURE:  [name] was identified in the holding area.  She was then taken to the operating room.  She was placed in a dorsal lithotomy position and underwent general endotracheal anesthesia without complications.  SCDs were on and operating.  Ancef 3 g had been administered prior to the procedure.  The patient was then prepped and draped in a normal sterile fashion.  A time-out was performed.  The patient has a large pannus and had a redundant vulva, and pannus was hanging.  We used a Graves speculum to visualize the cervix.  Anterior lip of the cervix was grasped with a single-tooth tenaculum.  Os binder was then used to dilate the internal os, and she was dilated up to a 6. I then advanced the hysteroscope, and there was some difficulty getting to the internal os, it was dilated  up to an 8.  I then was able to get to the fundus and visualize the findings above.  I would eventually need to increase the pressure so I could see better, but the module was advanced, and the polyps were removed; and then, I did a general curettage until the uterus was clean.  The MyoSure was then removed, and then I did a sharp curettage of all four quadrants.  It was gritty in the low in the posterior portion, a little slicked on the anterior portion.  Tissue specimen was obtained and sent to Pathology.  The Graves speculum was intermittently removed so I could try to get to the fundus due to the patient's habitus.  It was finally placed to take the tenaculum off.  Hemostasis was achieved with silver nitrate.  All instruments were removed from the vagina.  All instrument and sponge counts were correct x3.  The patient was taken off the operating room in stable condition.  She tolerated the procedure well.  Deficit was low.     Jola Schmidt, MD     EBV/MEDQ  D:  07/27/2015  T:  07/27/2015  Job:  [id number]

## 2015-07-27 NOTE — Anesthesia Preprocedure Evaluation (Signed)
Anesthesia Evaluation  Patient identified by MRN, date of birth, ID band Patient awake    Reviewed: Allergy & Precautions, NPO status , Patient's Chart, lab work & pertinent test results  Airway Mallampati: II  TM Distance: >3 FB Neck ROM: Full    Dental no notable dental hx.    Pulmonary asthma , sleep apnea and Continuous Positive Airway Pressure Ventilation , COPD, Current Smoker,    Pulmonary exam normal breath sounds clear to auscultation       Cardiovascular hypertension, Pt. on medications + Peripheral Vascular Disease and +CHF  Normal cardiovascular exam Rhythm:Regular Rate:Normal     Neuro/Psych negative neurological ROS  negative psych ROS   GI/Hepatic Neg liver ROS, GERD  Medicated and Poorly Controlled,  Endo/Other  diabetes, Type 2, Insulin DependentMorbid obesity  Renal/GU Renal disease  negative genitourinary   Musculoskeletal negative musculoskeletal ROS (+)   Abdominal   Peds negative pediatric ROS (+)  Hematology negative hematology ROS (+)   Anesthesia Other Findings   Reproductive/Obstetrics negative OB ROS                             Anesthesia Physical Anesthesia Plan  ASA: III  Anesthesia Plan: General   Post-op Pain Management:    Induction: Intravenous, Rapid sequence and Cricoid pressure planned  Airway Management Planned: Oral ETT  Additional Equipment:   Intra-op Plan:   Post-operative Plan: Extubation in OR  Informed Consent: I have reviewed the patients History and Physical, chart, labs and discussed the procedure including the risks, benefits and alternatives for the proposed anesthesia with the patient or authorized representative who has indicated his/her understanding and acceptance.   Dental advisory given  Plan Discussed with: CRNA  Anesthesia Plan Comments:         Anesthesia Quick Evaluation

## 2015-07-27 NOTE — H&P (Signed)
History of Present Illness  General:  55 y/o presents for hysteroscopy, D&C, resection of endometrial masses via Myosure. Pt has a h/o postmenopausal bleeding. Ultrasound showed 2 small endometrial masses. EMS ~ 3 mm. Pt has gotten clearance from her doctors for her multiple medical issues, PCP, nephrologist and endocrinologist. Pt without complaints. Denies bleeding at this time.   Current Medications  Taking   Gabapentin 300 MG Capsule 1 capsule Orally at bedtime once daily   Oxygen 2 Liters Nasal cannula at night only   Albuterol Sulfate HFA 108 (90 Base) MCG/ACT Aerosol Solution 2 puff Inhalation prn   Albuterol Sulfate (2.5 MG/3ML) 0.083% Nebulization Solution as directed Inhalation qid prn   Wellbutrin XL(BuPROPion HCl ER) 300 MG Tablet Extended Release 24 Hour 1 tablet every morning Orally daily   Promethazine HCl 25 mg Tablet 1 tablet Orally BID with meds, Notes: daily   Docusate Sodium 100 mg Capsule 1 capsule as needed Orally Once a day   Omeprazole 40 MG Capsule Delayed Release 1 capsule Orally twice a day   Pramipexole Dihydrochloride 0.125 MG Tablet 3 tablets 2 hours before bedtime. Orally Once a day   Bayer Contour Test(Blood Glucose Test) Strip   BD Pen Needle Nano U/F(Insulin Pen Needle) 32G X 4 MM Dx E11.21 . subcutaneous three times a day   Effient(Prasugrel HCl) 10 MG Tablet 1 tablet Oral once a day   Cymbalta(DULoxetine HCl) 60 MG Capsule Delayed Release Particles 1 capsule Orally at bedtime   Atorvastatin Calcium 20 MG Tablet 1 tablet Orally Once a day   Losartan Potassium 50 MG Tablet 1 tablet Orally Once a day   Aspirin 81 MG Tablet 1 tablet Orally Once a day   Oxycodone-Acetaminophen 7.5-325 MG Tablet 1 tablet as needed Orally TID, stop date 07/17/2015   Oxycodone-Acetaminophen 7.5-325 MG Tablet 1 tablet as needed Orally TID prn, stop date 08/16/2015   Oxycodone-Acetaminophen 7.5-325 MG Tablet 1 tablet as needed Orally TID prn   HydrOXYzine HCl 50 MG Tablet 1/2-1  tablet Orally every 6 hrs as needed   Triamcinolone Acetonide 0.1 % Ointment 1 application to affected area Externally Twice a day   PredniSONE 20 MG Tablet 2 tablets Orally Once a day   Losartan Potassium 50MG  Tablet TAKE 1 TABLET ONCE DAILY   Topamax(Topiramate) 50MG  Milligram Tablet take 1 tablet once daily Orally Once a day   Victoza(Liraglutide) 18 MG/3ML Solution 1.8 mg Subcutaneous Once a day   HumuLIN R U-500 KwikPen(Insulin Regular Human) 500 UNIT/ML Solution Pen-injector 150 units before breakfast, 60 units before evening meal Subcutaneous Twice a day, thirty minutes before that meal   Medication List reviewed and reconciled with the patient    Past Medical History  Deep necrotizing abscess of the buttock in 2014- hospitalized for months, s/p multiple surgeries/prolonged ICU stay. Please have low threshold to image her pelvis if she complains of redness or drainage in this area, low threshold to examine her if she has non-specific complaints like fatigue or hyperglycemia.   Severe peripheral arterial disease s/p left femoral endarterectomies x2 (occluded), angioplasty, and left external illiac stenting. .   Diabetes- Dr. Buddy Duty.   Mild COPD.   GERD.   Morbid Obesity.   Diverticulitis.   Diastolic dysfunction of the left ventricle, NUC - low risk 12.13.   Gout.   Sleep apnea.   Kidney disease.   Migraines.   Degenerative Disc Disease.           Surgical History  cholecystectomy   gastric  bypass 2003  Endarterectomy Femoral, Patch angioplasty 2014  Femoral bypass 11/2013   Family History  Father: deceased 59 yrs, CAD, CABG 3, blood clot, diagnosed with DM, CAD  Mother: deceased 90 yrs, hernia, Breast cancer, diagnosed with DM, Breast Ca  Paternal Grand Father: deceased  Paternal Grand Mother: deceased  Maternal Grand Father: deceased  Maternal Grand Mother: deceased, Breast cancer  Sister 1: deceased, some type of female cancer, Cervical?  No family hx  colon cancer, polyps or liver disease.   Social History  General:  Tobacco use  cigarettes: Current smoker Frequency: intermittent smoker Estimated Pack-years: 20 Tobacco history last updated 07/12/2015 Additional Findings: Tobacco User Light cigarette smoker ((1-9 cigs/day) no Alcohol.  no Recreational drug use.  no Exercise.  Marital Status: married.  Children: none.  Family history- sister did have issues with drugs.   Gyn History  Periods : 2012.  LMP PMP.  Denies H/O Birth control.  Last pap smear date 04/21/15, negative.  Last mammogram date 7.7.14.  Denies H/O Abnormal pap smear.  Denies H/O STD.  GYN procedures Endometrial Biopsy, 10/2010, benign.    OB History  Never been pregnant per patient.    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 No h/o complication with anesthesia, bleeding disorders or blood clots.   Vital Signs  Wt 302, Wt change -6.4 lb, Ht 66.25, BMI 48.37, Pulse sitting 109, BP sitting 101/74.   Physical Examination  GENERAL:  Patient appears alert and oriented.  General Appearance: well-appearing, well-developed, no acute distress.  Speech: clear.  LUNGS:  Auscultation: no wheezing/rhonchi/rales. CTA bilaterally.  HEART:  Heart sounds: normal. RRR. no murmur.  ABDOMEN:  General: soft nontender, nondistended, no masses.  FEMALE GENITOURINARY:  Pelvic Not examined.  EXTREMITIES:  General: No edema or calf tenderness.     Assessments   1. Preoperative exam for gynecologic surgery - Z01.818   2. PMB (postmenopausal bleeding) - N95.0   3. Endometrial mass - N94.89   Treatment  1. Preoperative exam for gynecologic surgery  Notes: Pt counseled on R/B/A. Informed of  increased intravascual volume with hysteroscopy, which is a consideration in a pt with renal disease. Also reviewed risk of bleeding and infection. All questions answered, consent obtained.    Follow Up  2 Weeks post op

## 2015-07-27 NOTE — Interval H&P Note (Signed)
History and Physical Interval Note:  07/27/2015 3:20 PM  Stephanie Peck  has presented today for surgery, with the diagnosis of N95.0 PMB  The various methods of treatment have been discussed with the patient and family. After consideration of risks, benefits and other options for treatment, the patient has consented to  Procedure(s): [place] (N/A) as a surgical intervention .  The patient's history has been reviewed, patient examined, no change in status, stable for surgery.  I have reviewed the patient's chart and labs.  Questions were answered to the patient's satisfaction.     Simona Huh, Doyal Saric

## 2015-07-27 NOTE — Anesthesia Procedure Notes (Signed)
Procedure Name: Intubation Date/Time: 07/27/2015 3:42 PM Performed by: Casimer Lanius A Pre-anesthesia Checklist: Patient identified, Patient being monitored, Timeout performed, Emergency Drugs available and Suction available Patient Re-evaluated:Patient Re-evaluated prior to inductionOxygen Delivery Method: Circle System Utilized Preoxygenation: Pre-oxygenation with 100% oxygen Intubation Type: IV induction Laryngoscope Size: Mac and 3 Grade View: Grade III Tube type: Oral Tube size: 7.0 mm Number of attempts: 1 Airway Equipment and Method: stylet Placement Confirmation: ETT inserted through vocal cords under direct vision,  positive ETCO2 and breath sounds checked- equal and bilateral Secured at: 21 cm Tube secured with: Tape Dental Injury: Teeth and Oropharynx as per pre-operative assessment

## 2015-07-28 ENCOUNTER — Encounter (HOSPITAL_COMMUNITY): Payer: Self-pay | Admitting: Obstetrics and Gynecology

## 2015-07-28 NOTE — Addendum Note (Signed)
Addendum  created 07/28/15 1214 by Laverle Hobby, CRNA   Modules edited: Charges VN

## 2015-07-28 NOTE — Anesthesia Postprocedure Evaluation (Signed)
Anesthesia Post Note  Patient: Stephanie Peck  Procedure(s) Performed: Procedure(s) (LRB): DILATATION & CURETTAGE/HYSTEROSCOPY WITH MYOSURE (N/A)  Patient location during evaluation: PACU Anesthesia Type: General Level of consciousness: awake and alert Pain management: pain level controlled Vital Signs Assessment: post-procedure vital signs reviewed and stable Respiratory status: spontaneous breathing, nonlabored ventilation, respiratory function stable and patient connected to nasal cannula oxygen Cardiovascular status: blood pressure returned to baseline and stable Postop Assessment: no signs of nausea or vomiting Anesthetic complications: no    Last Vitals:  Filed Vitals:   07/27/15 1800 07/27/15 1850  BP: 134/62 180/86  Pulse: 88 98  Temp: 36.9 C 36.9 C  Resp: 11 20    Last Pain:  Filed Vitals:   07/27/15 1923  PainSc: 7                  Montez Hageman

## 2015-07-29 ENCOUNTER — Encounter: Payer: Self-pay | Admitting: Cardiology

## 2015-07-29 DIAGNOSIS — E1122 Type 2 diabetes mellitus with diabetic chronic kidney disease: Secondary | ICD-10-CM | POA: Diagnosis not present

## 2015-07-29 DIAGNOSIS — E782 Mixed hyperlipidemia: Secondary | ICD-10-CM | POA: Diagnosis not present

## 2015-07-29 DIAGNOSIS — F3342 Major depressive disorder, recurrent, in full remission: Secondary | ICD-10-CM | POA: Diagnosis not present

## 2015-07-29 DIAGNOSIS — N183 Chronic kidney disease, stage 3 (moderate): Secondary | ICD-10-CM | POA: Diagnosis not present

## 2015-07-29 DIAGNOSIS — I10 Essential (primary) hypertension: Secondary | ICD-10-CM | POA: Diagnosis not present

## 2015-07-29 DIAGNOSIS — Z794 Long term (current) use of insulin: Secondary | ICD-10-CM | POA: Diagnosis not present

## 2015-07-29 DIAGNOSIS — J449 Chronic obstructive pulmonary disease, unspecified: Secondary | ICD-10-CM | POA: Diagnosis not present

## 2015-07-29 DIAGNOSIS — E1151 Type 2 diabetes mellitus with diabetic peripheral angiopathy without gangrene: Secondary | ICD-10-CM | POA: Diagnosis not present

## 2015-07-29 DIAGNOSIS — I5032 Chronic diastolic (congestive) heart failure: Secondary | ICD-10-CM | POA: Diagnosis not present

## 2015-08-10 DIAGNOSIS — G2581 Restless legs syndrome: Secondary | ICD-10-CM | POA: Diagnosis not present

## 2015-08-10 DIAGNOSIS — E782 Mixed hyperlipidemia: Secondary | ICD-10-CM | POA: Diagnosis not present

## 2015-08-10 DIAGNOSIS — Z0000 Encounter for general adult medical examination without abnormal findings: Secondary | ICD-10-CM | POA: Diagnosis not present

## 2015-08-10 DIAGNOSIS — B351 Tinea unguium: Secondary | ICD-10-CM | POA: Diagnosis not present

## 2015-08-18 ENCOUNTER — Encounter: Payer: Self-pay | Admitting: Surgery

## 2015-08-19 ENCOUNTER — Other Ambulatory Visit: Payer: Self-pay | Admitting: *Deleted

## 2015-08-19 ENCOUNTER — Telehealth: Payer: Self-pay | Admitting: Surgery

## 2015-08-19 DIAGNOSIS — I739 Peripheral vascular disease, unspecified: Secondary | ICD-10-CM

## 2015-08-19 DIAGNOSIS — Z48812 Encounter for surgical aftercare following surgery on the circulatory system: Secondary | ICD-10-CM

## 2015-08-19 NOTE — Telephone Encounter (Signed)
-----   Message from Rufina Falco sent at 08/19/2015 10:21 AM EDT ----- Regarding: Order change for 07/31 [id number] Dr. Trula Slade 07/31 atherosclerosis with claudication

## 2015-08-25 DIAGNOSIS — Z23 Encounter for immunization: Secondary | ICD-10-CM | POA: Diagnosis not present

## 2015-08-25 DIAGNOSIS — Z794 Long term (current) use of insulin: Secondary | ICD-10-CM | POA: Diagnosis not present

## 2015-08-25 DIAGNOSIS — N183 Chronic kidney disease, stage 3 (moderate): Secondary | ICD-10-CM | POA: Diagnosis not present

## 2015-08-25 DIAGNOSIS — E1121 Type 2 diabetes mellitus with diabetic nephropathy: Secondary | ICD-10-CM | POA: Diagnosis not present

## 2015-08-29 ENCOUNTER — Ambulatory Visit: Payer: Medicare Other | Admitting: Surgery

## 2015-08-29 ENCOUNTER — Ambulatory Visit (HOSPITAL_COMMUNITY): Payer: Medicare Other

## 2015-08-29 ENCOUNTER — Ambulatory Visit (HOSPITAL_COMMUNITY)
Admission: RE | Admit: 2015-08-29 | Discharge: 2015-08-29 | Disposition: A | Discharge status: Home / Self Care | Payer: Medicare Other | Source: Ambulatory Visit | Attending: Surgery | Admitting: Surgery

## 2015-08-29 DIAGNOSIS — Z48812 Encounter for surgical aftercare following surgery on the circulatory system: Secondary | ICD-10-CM

## 2015-08-29 DIAGNOSIS — I739 Peripheral vascular disease, unspecified: Secondary | ICD-10-CM

## 2015-09-26 DIAGNOSIS — I5032 Chronic diastolic (congestive) heart failure: Secondary | ICD-10-CM | POA: Diagnosis not present

## 2015-09-26 DIAGNOSIS — I10 Essential (primary) hypertension: Secondary | ICD-10-CM | POA: Diagnosis not present

## 2015-09-26 DIAGNOSIS — Z794 Long term (current) use of insulin: Secondary | ICD-10-CM | POA: Diagnosis not present

## 2015-09-26 DIAGNOSIS — F3342 Major depressive disorder, recurrent, in full remission: Secondary | ICD-10-CM | POA: Diagnosis not present

## 2015-09-26 DIAGNOSIS — N183 Chronic kidney disease, stage 3 (moderate): Secondary | ICD-10-CM | POA: Diagnosis not present

## 2015-09-26 DIAGNOSIS — E1122 Type 2 diabetes mellitus with diabetic chronic kidney disease: Secondary | ICD-10-CM | POA: Diagnosis not present

## 2015-09-26 DIAGNOSIS — E782 Mixed hyperlipidemia: Secondary | ICD-10-CM | POA: Diagnosis not present

## 2015-09-26 DIAGNOSIS — E1151 Type 2 diabetes mellitus with diabetic peripheral angiopathy without gangrene: Secondary | ICD-10-CM | POA: Diagnosis not present

## 2015-09-26 DIAGNOSIS — J449 Chronic obstructive pulmonary disease, unspecified: Secondary | ICD-10-CM | POA: Diagnosis not present

## 2015-11-07 ENCOUNTER — Ambulatory Visit: Payer: Medicare Other | Admitting: Surgery

## 2015-11-07 ENCOUNTER — Encounter (HOSPITAL_COMMUNITY): Payer: Medicare Other

## 2015-11-16 DIAGNOSIS — G8929 Other chronic pain: Secondary | ICD-10-CM | POA: Diagnosis not present

## 2015-11-16 DIAGNOSIS — Z23 Encounter for immunization: Secondary | ICD-10-CM | POA: Diagnosis not present

## 2015-11-24 ENCOUNTER — Encounter: Payer: Self-pay | Admitting: Surgery

## 2015-11-28 ENCOUNTER — Encounter (HOSPITAL_COMMUNITY): Payer: Medicare Other

## 2015-11-28 ENCOUNTER — Ambulatory Visit: Payer: Medicare Other | Admitting: Surgery

## 2015-11-28 ENCOUNTER — Inpatient Hospital Stay (HOSPITAL_COMMUNITY): Admission: RE | Admit: 2015-11-28 | Payer: Medicare Other | Source: Ambulatory Visit

## 2016-02-14 ENCOUNTER — Encounter: Payer: Self-pay | Admitting: Surgery

## 2016-02-20 ENCOUNTER — Ambulatory Visit (HOSPITAL_COMMUNITY)
Admission: RE | Admit: 2016-02-20 | Discharge: 2016-02-20 | Disposition: A | Discharge status: Home / Self Care | Payer: Medicare Other | Source: Ambulatory Visit | Attending: Surgery | Admitting: Surgery

## 2016-02-20 ENCOUNTER — Ambulatory Visit (INDEPENDENT_AMBULATORY_CARE_PROVIDER_SITE_OTHER)
Admission: RE | Admit: 2016-02-20 | Discharge: 2016-02-20 | Disposition: A | Discharge status: Home / Self Care | Payer: Medicare Other | Source: Ambulatory Visit | Attending: Surgery | Admitting: Surgery

## 2016-02-20 ENCOUNTER — Ambulatory Visit (INDEPENDENT_AMBULATORY_CARE_PROVIDER_SITE_OTHER): Payer: Medicare Other | Admitting: Physician Assistant

## 2016-02-20 ENCOUNTER — Encounter: Payer: Self-pay | Admitting: Physician Assistant

## 2016-02-20 DIAGNOSIS — I739 Peripheral vascular disease, unspecified: Secondary | ICD-10-CM

## 2016-02-20 DIAGNOSIS — Z48812 Encounter for surgical aftercare following surgery on the circulatory system: Secondary | ICD-10-CM | POA: Diagnosis not present

## 2016-02-20 NOTE — Progress Notes (Signed)
HISTORY AND PHYSICAL     CC:  Follow up Referring Provider:  Maurice Small, MD  HPI: This is a 56 y.o. female who presents today for follow up.   On 12/05/2011 she underwent stenting of her right superficial femoral artery. On 02/22/2012 she underwent left femoral endarterectomy with patch angioplasty. She developed a significant wound infection and then occluded her endarterectomy site. She went back for redo left femoral endarterectomy with patch angioplasty and angioplasty of the left external iliac stent. This was done on 04/06/2012. Simultaneously she underwent muscle flap closure by Dr. Migdalia Dk. On 12/09/2012 she developed bypass graft stenosis and underwent angioplasty of the left superficial femoral artery and stenting of the left external iliac artery. She was later found to have left external iliac occlusion and therefore on1119 12/17/2013 she underwent right to left femoral-femoral bypass graft with an 8 mm dacryon graft. She developed in-stent stenosis and on 04/14/2014 had drug coated balloon angioplasty to her right superficial femoral artery.  She states that not much has changed since her last visit.  She continues to have the back pain.  She denies any non healing wounds.  She does c/o numbness in her left hand.  She states that it has gotten worse since her last visit.  She also states that at times, she will develop a boil on the edge of the right groin incision and that it will drain & heal up.    She denies having any fevers.  She does have DOE and while lying flat.  She sees her cardiologist regularly.  She is on home O2.  She does have leg swelling that is worse today from sitting.  She quit smoking 3 years ago.   She is on insulin for her diabetes.  She is on a statin for cholesterol management.  She takes a daily aspirin.  She is on an ARB for blood pressure management.  She is on Effient.  Past Medical History:  Diagnosis Date  . Anxiety   . Asthma   . CHF  (congestive heart failure) ([place])   . Chronic back pain   . Chronic bronchitis ([place])    "get it pretty much q yr"  . Chronic kidney disease    chronic stage 3 renal disease  . COPD (chronic obstructive pulmonary disease) ([place])   . DDD (degenerative disc disease), lumbar   . Depression   . GERD (gastroesophageal reflux disease)   . Headache    Migraines  . Hx of long term use of blood thinners   . Hypertension   . Left ventricular diastolic dysfunction   . Morbid obesity ([place])   . On home oxygen therapy    "2L q hs" (12/19/2013)  . OSA on CPAP    severe OSA by 09/08/06 sleep study ([place])  . Peripheral vascular disease ([place])   . Skin rash    on arms  . Type II diabetes mellitus ([place])     Past Surgical History:  Procedure Laterality Date  . ABDOMINAL AORTAGRAM N/A 12/09/2012   Procedure: ABDOMINAL AORTAGRAM;  Surgeon: Serafina Mitchell, MD;  Location: [facility] CATH LAB;  Service: Cardiovascular;  Laterality: N/A;  . ABDOMINAL AORTAGRAM N/A 11/24/2013   Procedure: ABDOMINAL AORTAGRAM;  Surgeon: Serafina Mitchell, MD;  Location: [facility] CATH LAB;  Service: Cardiovascular;  Laterality: N/A;  . ABDOMINAL AORTAGRAM N/A 04/14/2014   Procedure: ABDOMINAL Maxcine Ham;  Surgeon: Serafina Mitchell, MD;  Location: [facility] CATH LAB;  Service: Cardiovascular;  Laterality: N/A;  .  AORTOGRAM Left 04/04/2012   Procedure: AORTOGRAM;  Surgeon: Serafina Mitchell, MD;  Location: [place];  Service: Vascular;  Laterality: Left;  . APPLICATION OF WOUND VAC  02/22/2012   Procedure: APPLICATION OF WOUND VAC;  Surgeon: Serafina Mitchell, MD;  Location: [place];  Service: Vascular;  Laterality: Left;  application wound vac  . BREAST BIOPSY Right ~ 2005  . CHOLECYSTECTOMY    . CORONARY ANGIOPLASTY    . DILATATION & CURETTAGE/HYSTEROSCOPY WITH MYOSURE N/A 07/27/2015   Procedure: DILATATION & CURETTAGE/HYSTEROSCOPY WITH MYOSURE;  Surgeon: Thurnell Lose, MD;  Location: [facility];  Service: Gynecology;  Laterality: N/A;  . ENDARTERECTOMY FEMORAL   02/22/2012   Procedure: ENDARTERECTOMY FEMORAL;  Surgeon: Serafina Mitchell, MD;  Location: [place];  Service: Vascular;  Laterality: Left;  . FEMORAL ARTERY - FEMORAL ARTERY BYPASS GRAFT  12/17/2013  . FEMORAL ARTERY STENT Right 12/05/2011   superficial   . FEMORAL ARTERY STENT Right 12/17/2013  . FEMORAL-FEMORAL BYPASS GRAFT N/A 12/17/2013   Procedure: BYPASS GRAFT FEMORAL-FEMORAL ARTERY-RIGHT TO LEFT;  Surgeon: Serafina Mitchell, MD;  Location: [place];  Service: Vascular;  Laterality: N/A;  . GROIN DEBRIDEMENT  03/01/2012   Procedure: Virl Son DEBRIDEMENT;  Surgeon: Rosetta Posner, MD;  Location: [place];  Service: Vascular;  Laterality: Left;  . INSERTION OF ILIAC STENT Right 12/17/2013   Procedure:  Insertion of Stent Right Superficial Femoral Artery ;  Surgeon: Serafina Mitchell, MD;  Location: [place];  Service: Vascular;  Laterality: Right;  . IRRIGATION AND DEBRIDEMENT ABSCESS Left 03/05/2013   Procedure: IRRIGATION AND DEBRIDEMENT ABSCESS;  Surgeon: Zenovia Jarred, MD;  Location: [place];  Service: General;  Laterality: Left;  . LEFT HEART CATHETERIZATION WITH CORONARY ANGIOGRAM N/A 04/29/2014   Procedure: LEFT HEART CATHETERIZATION WITH CORONARY ANGIOGRAM;  Surgeon: Jerline Pain, MD;  Location: [facility] CATH LAB;  Service: Cardiovascular;  Laterality: N/A;  . LOWER EXTREMITY ANGIOGRAM Bilateral 12/05/2011   Procedure: LOWER EXTREMITY ANGIOGRAM;  Surgeon: Serafina Mitchell, MD;  Location: [facility] CATH LAB;  Service: Cardiovascular;  Laterality: Bilateral;  . MUSCLE FLAP CLOSURE Left 04/04/2012   Procedure: MUSCLE FLAP CLOSURE;  Surgeon: Theodoro Kos, DO;  Location: [place];  Service: Plastics;  Laterality: Left;  . PATCH ANGIOPLASTY  02/22/2012   Procedure: PATCH ANGIOPLASTY;  Surgeon: Serafina Mitchell, MD;  Location: [facility] OR;  Service: Vascular;  Laterality: Left;  left femoral patch angioplasty  . PATCH ANGIOPLASTY Left 04/04/2012   Procedure: PATCH ANGIOPLASTY;  Surgeon: Serafina Mitchell, MD;  Location: [place];  Service:  Vascular;  Laterality: Left;  . ROUX-EN-Y GASTRIC BYPASS  2006  . TONSILLECTOMY      Allergies  Allergen Reactions  . Celebrex [Celecoxib] Shortness Of Breath  . Codeine Nausea Only    Current Outpatient Prescriptions  Medication Sig Dispense Refill  . acetaminophen (TYLENOL) 500 MG tablet Take 1 tablet (500 mg total) by mouth every 8 (eight) hours as needed for moderate pain. 30 tablet 0  . albuterol (PROVENTIL HFA;VENTOLIN HFA) 108 (90 BASE) MCG/ACT inhaler Inhale 1 puff into the lungs every 6 (six) hours as needed for wheezing or shortness of breath.    Marland Kitchen aspirin EC 81 MG tablet Take 1 tablet (81 mg total) by mouth daily.    Marland Kitchen atorvastatin (LIPITOR) 20 MG tablet TAKE 1 TABLET BY MOUTH EVERY DAY 15 tablet 0  . buPROPion (WELLBUTRIN XL) 300 MG 24 hr tablet Take 300 mg by mouth daily.    Marland Kitchen  docusate sodium (COLACE) 100 MG capsule Take 100 mg by mouth daily.    . DULoxetine (CYMBALTA) 60 MG capsule Take 60 mg by mouth daily.    . furosemide (LASIX) 40 MG tablet Take 40 mg by mouth daily.  3  . gabapentin (NEURONTIN) 300 MG capsule Take 1 capsule (300 mg total) by mouth at bedtime. 30 capsule 3  . HUMULIN R U-500 KWIKPEN 500 UNIT/ML SOPN Inject 50-150 Units into the skin 2 (two) times daily with a meal. 150 units in the morning and 50 units in the evening  0  . Liraglutide (VICTOZA) 18 MG/3ML SOPN Inject 1.8 mg into the skin daily.    Marland Kitchen losartan (COZAAR) 50 MG tablet Take 50 mg by mouth daily.    . Methylcellulose, Laxative, (FIBER THERAPY) 500 MG TABS Take 1 tablet by mouth 2 (two) times daily.    Marland Kitchen omeprazole (PRILOSEC) 40 MG capsule Take 40 mg by mouth 2 (two) times daily.    Marland Kitchen oxyCODONE-acetaminophen (PERCOCET) 7.5-325 MG tablet Take 1 tablet by mouth 3 (three) times daily as needed for moderate pain.   0  . pramipexole (MIRAPEX) 0.125 MG tablet Take 0.375 mg by mouth at bedtime.    . prasugrel (EFFIENT) 10 MG TABS tablet Take 1 tablet (10 mg total) by mouth daily. 30 tablet 11  .  promethazine (PHENERGAN) 25 MG tablet Take 25 mg by mouth every 6 (six) hours as needed for nausea or vomiting.     . topiramate (TOPAMAX) 50 MG tablet Take 1 tablet (50 mg total) by mouth daily. 30 tablet 3  . triamcinolone ointment (KENALOG) 0.1 % Apply 1 application topically 3 (three) times daily.  1   No current facility-administered medications for this visit.     Family History  Problem Relation Age of Onset  . Cancer Mother   . Depression Mother   . Heart disease Father     before age 80  . Diabetes Father   . Hypertension Father   . Hyperlipidemia Father   . Heart attack Father   . Cancer Maternal Grandmother     Social History   Social History  . Marital status: Married    Spouse name: N/A  . Number of children: N/A  . Years of education: N/A   Occupational History  . Not on file.   Social History Main Topics  . Smoking status: Current Some Day Smoker    Packs/day: 0.25    Years: 20.00    Types: Cigarettes    Start date: 09/30/2011    Last attempt to quit: 10/30/2011  . Smokeless tobacco: Never Used  . Alcohol use No  . Drug use: No  . Sexual activity: Not Currently    Birth control/ protection: Post-menopausal   Other Topics Concern  . Not on file   Social History Narrative  . No narrative on file     REVIEW OF SYSTEMS:   [X]  denotes positive finding, [ ]  denotes negative finding Cardiac  Comments:  Chest pain or chest pressure:    Shortness of breath upon exertion: x   Short of breath when lying flat: x   Irregular heart rhythm:        Vascular    Pain in calf, thigh, or hip brought on by ambulation: x   Pain in feet at night that wakes you up from your sleep:     Blood clot in your veins:    Leg swelling:  x  Pulmonary    Oxygen at home: x   Productive cough:     Wheezing:         Neurologic    Sudden weakness in arms or legs:  x See HPI  Sudden numbness in arms or legs:  x See HPI  Sudden onset of difficulty speaking or  slurred speech:    Temporary loss of vision in one eye:     Problems with dizziness:  x       Gastrointestinal    Blood in stool:     Vomited blood:         Genitourinary    Burning when urinating:     Blood in urine:        Psychiatric    Major depression:  x       Hematologic    Bleeding problems: x On Effient  Problems with blood clotting too easily:        Skin    Rashes or ulcers:        Constitutional    Fever or chills:      PHYSICAL EXAMINATION:   General:  WDWN in NAD; vital signs documented above Gait: Walks with a cane HENT: WNL, normocephalic Pulmonary: normal non-labored breathing , without Rales, rhonchi,  wheezing Cardiac: regular HR, without  Murmurs, rubs or gallops; without carotid bruit  Abdomen: soft, NT, no masses Skin: without rashes Vascular Exam/Pulses:  Right Left  Radial Unable to palpate  Unable to palpate   Ulnar Unable to palpate  Unable to palpate   Femoral Unable to palpate due to body habitus  Unable to palpate due to body habitus  DP Unable to palpate  Unable to palpate   PT Unable to palpate  Unable to palpate    Extremities: without ischemic changes, without Gangrene , without cellulitis; without open wounds; bilateral feet are warm Musculoskeletal: no muscle wasting or atrophy  Neurologic: A&O X 3;  No focal weakness or paresthesias are detected Psychiatric:  The pt has Normal affect.   Non-Invasive Vascular Imaging:   ABI's on 02/21/16: Right:  0.68 Left:  0.64  Previous ABI's on 02/21/15: Right:  0.73 Left:  0.71 (prior both were 0.64)  BLE arterial duplex 02/21/16: -the right to left femoral to femoral bypass is patent -there is no significant change from previous exam  Pt meds includes: Statin:  Yes.   Beta Blocker:  No. Aspirin:  Yes.   ACEI:  No. ARB:  Yes.   CCB use:  No Other Antiplatelet/Anticoagulant:  Yes Effient   ASSESSMENT/PLAN:: 56 y.o. female with hx of multiple procedures for  PAD   -she is doing well and has not really had any changes since her last visit with the exception of more back pain (her legs are unchanged) -she states she does develop boils on the lateral edge of the right groin incision regularly with drainage and then it improves.  I discussed with her to keep her groins dry.  If she develops this in the future and there is purulence and/or fevers, she needs to see [place] back sooner as she does have synthetic graft material in both groins.   -she does have numbness of the left hand that has worsened.  She does a lot of computer work.  I discussed with her getting a brace for her hand to wear at night to see if this improves as she may have CTS.  If it does not improve, she may need to see a  hand surgeon.   -she will f/u in 6 months with ABI's and BLE arterial duplex.  She will return sooner if she has acute changes.    Leontine Locket, PA-C [facility] [phone]  Clinic MD:  Trula Slade

## 2016-03-21 DIAGNOSIS — H2511 Age-related nuclear cataract, right eye: Secondary | ICD-10-CM | POA: Diagnosis not present

## 2016-03-21 DIAGNOSIS — E119 Type 2 diabetes mellitus without complications: Secondary | ICD-10-CM | POA: Diagnosis not present

## 2016-03-21 DIAGNOSIS — H353131 Nonexudative age-related macular degeneration, bilateral, early dry stage: Secondary | ICD-10-CM | POA: Diagnosis not present

## 2016-03-21 DIAGNOSIS — H2512 Age-related nuclear cataract, left eye: Secondary | ICD-10-CM | POA: Diagnosis not present

## 2016-03-22 DIAGNOSIS — Z23 Encounter for immunization: Secondary | ICD-10-CM | POA: Diagnosis not present

## 2016-03-22 DIAGNOSIS — E1121 Type 2 diabetes mellitus with diabetic nephropathy: Secondary | ICD-10-CM | POA: Diagnosis not present

## 2016-03-22 DIAGNOSIS — N183 Chronic kidney disease, stage 3 (moderate): Secondary | ICD-10-CM | POA: Diagnosis not present

## 2016-03-22 DIAGNOSIS — Z794 Long term (current) use of insulin: Secondary | ICD-10-CM | POA: Diagnosis not present

## 2016-04-02 DIAGNOSIS — L732 Hidradenitis suppurativa: Secondary | ICD-10-CM | POA: Diagnosis not present

## 2016-04-02 DIAGNOSIS — J449 Chronic obstructive pulmonary disease, unspecified: Secondary | ICD-10-CM | POA: Diagnosis not present

## 2016-04-02 DIAGNOSIS — K580 Irritable bowel syndrome with diarrhea: Secondary | ICD-10-CM | POA: Diagnosis not present

## 2016-04-02 DIAGNOSIS — G43109 Migraine with aura, not intractable, without status migrainosus: Secondary | ICD-10-CM | POA: Diagnosis not present

## 2016-04-02 DIAGNOSIS — I739 Peripheral vascular disease, unspecified: Secondary | ICD-10-CM | POA: Diagnosis not present

## 2016-04-02 DIAGNOSIS — E1151 Type 2 diabetes mellitus with diabetic peripheral angiopathy without gangrene: Secondary | ICD-10-CM | POA: Diagnosis not present

## 2016-04-02 DIAGNOSIS — G4733 Obstructive sleep apnea (adult) (pediatric): Secondary | ICD-10-CM | POA: Diagnosis not present

## 2016-04-02 DIAGNOSIS — E782 Mixed hyperlipidemia: Secondary | ICD-10-CM | POA: Diagnosis not present

## 2016-04-02 DIAGNOSIS — G894 Chronic pain syndrome: Secondary | ICD-10-CM | POA: Diagnosis not present

## 2016-04-02 DIAGNOSIS — Z5181 Encounter for therapeutic drug level monitoring: Secondary | ICD-10-CM | POA: Diagnosis not present

## 2016-04-02 DIAGNOSIS — I10 Essential (primary) hypertension: Secondary | ICD-10-CM | POA: Diagnosis not present

## 2016-04-19 DIAGNOSIS — H2512 Age-related nuclear cataract, left eye: Secondary | ICD-10-CM | POA: Diagnosis not present

## 2016-05-02 ENCOUNTER — Telehealth: Payer: Self-pay

## 2016-05-02 DIAGNOSIS — Z01818 Encounter for other preprocedural examination: Secondary | ICD-10-CM | POA: Diagnosis not present

## 2016-05-02 DIAGNOSIS — H2511 Age-related nuclear cataract, right eye: Secondary | ICD-10-CM | POA: Diagnosis not present

## 2016-05-02 NOTE — Telephone Encounter (Signed)
Phone call from Dr. Mingo Amber office.  Stated they need a recommendation from D.r Brabham on holding her Effient prior to oral surgery.  Noted that pt. Was placed on Brilinta 11/2013, then changed by the Cardiologist to [place] in 12/2013, due to SOB.  Advised Dr. Trula Slade out of office until 4/9, and will discuss with him at that time, and return call to their office.  Agreed.

## 2016-05-08 NOTE — Telephone Encounter (Signed)
Discussed with Dr. Trula Slade, pt's need for authorization to hold Effient prior to oral surgery, per request from Dr. Mingo Amber office.  Dr. Trula Slade advised it is okay for pt. to hold Effient on week prior to oral surgery, and to resume as soon as possible following the procedure.    Phone call to Dr. Mingo Amber office; spoke with Jerline Pain.  Advised of above recommendation.  Verb. understanding. Will fax note to Dr. Mingo Amber office.

## 2016-05-10 DIAGNOSIS — H2511 Age-related nuclear cataract, right eye: Secondary | ICD-10-CM | POA: Diagnosis not present

## 2016-06-06 DIAGNOSIS — G8929 Other chronic pain: Secondary | ICD-10-CM | POA: Diagnosis not present

## 2016-06-06 DIAGNOSIS — G894 Chronic pain syndrome: Secondary | ICD-10-CM | POA: Diagnosis not present

## 2016-06-06 DIAGNOSIS — Z23 Encounter for immunization: Secondary | ICD-10-CM | POA: Diagnosis not present

## 2016-06-26 DIAGNOSIS — I10 Essential (primary) hypertension: Secondary | ICD-10-CM | POA: Diagnosis not present

## 2016-06-26 DIAGNOSIS — I5032 Chronic diastolic (congestive) heart failure: Secondary | ICD-10-CM | POA: Diagnosis not present

## 2016-06-26 DIAGNOSIS — F3342 Major depressive disorder, recurrent, in full remission: Secondary | ICD-10-CM | POA: Diagnosis not present

## 2016-06-26 DIAGNOSIS — J449 Chronic obstructive pulmonary disease, unspecified: Secondary | ICD-10-CM | POA: Diagnosis not present

## 2016-06-26 DIAGNOSIS — N183 Chronic kidney disease, stage 3 (moderate): Secondary | ICD-10-CM | POA: Diagnosis not present

## 2016-06-26 DIAGNOSIS — E782 Mixed hyperlipidemia: Secondary | ICD-10-CM | POA: Diagnosis not present

## 2016-06-26 DIAGNOSIS — E1151 Type 2 diabetes mellitus with diabetic peripheral angiopathy without gangrene: Secondary | ICD-10-CM | POA: Diagnosis not present

## 2016-06-27 ENCOUNTER — Telehealth: Payer: Self-pay | Admitting: *Deleted

## 2016-06-27 NOTE — Telephone Encounter (Signed)
Ms. Milos called the research office after receiving a letter from our office when we were unable to get in contact with her regarding her SAFE-DCB follow-up. She recently has moved to [place] after the death of her husband. She states she has been doing well and has had no additional interventions to her right leg.

## 2016-08-08 ENCOUNTER — Encounter: Payer: Self-pay | Admitting: Family

## 2016-08-13 DIAGNOSIS — Z720 Tobacco use: Secondary | ICD-10-CM | POA: Diagnosis not present

## 2016-08-13 DIAGNOSIS — G8929 Other chronic pain: Secondary | ICD-10-CM | POA: Diagnosis not present

## 2016-08-13 DIAGNOSIS — I5032 Chronic diastolic (congestive) heart failure: Secondary | ICD-10-CM | POA: Diagnosis not present

## 2016-08-13 DIAGNOSIS — E1351 Other specified diabetes mellitus with diabetic peripheral angiopathy without gangrene: Secondary | ICD-10-CM | POA: Diagnosis not present

## 2016-08-13 DIAGNOSIS — E1169 Type 2 diabetes mellitus with other specified complication: Secondary | ICD-10-CM | POA: Diagnosis not present

## 2016-08-13 DIAGNOSIS — M25552 Pain in left hip: Secondary | ICD-10-CM | POA: Diagnosis not present

## 2016-08-13 DIAGNOSIS — J449 Chronic obstructive pulmonary disease, unspecified: Secondary | ICD-10-CM | POA: Diagnosis not present

## 2016-08-13 DIAGNOSIS — E7800 Pure hypercholesterolemia, unspecified: Secondary | ICD-10-CM | POA: Diagnosis not present

## 2016-08-13 DIAGNOSIS — I110 Hypertensive heart disease with heart failure: Secondary | ICD-10-CM | POA: Diagnosis not present

## 2016-08-20 ENCOUNTER — Ambulatory Visit: Payer: Medicare Other | Admitting: Family

## 2016-08-20 ENCOUNTER — Encounter (HOSPITAL_COMMUNITY): Payer: Medicare Other

## 2016-08-22 DIAGNOSIS — Z803 Family history of malignant neoplasm of breast: Secondary | ICD-10-CM | POA: Diagnosis not present

## 2016-08-22 DIAGNOSIS — Z1231 Encounter for screening mammogram for malignant neoplasm of breast: Secondary | ICD-10-CM | POA: Diagnosis not present

## 2016-08-23 DIAGNOSIS — I70203 Unspecified atherosclerosis of native arteries of extremities, bilateral legs: Secondary | ICD-10-CM | POA: Diagnosis not present

## 2016-08-23 DIAGNOSIS — I739 Peripheral vascular disease, unspecified: Secondary | ICD-10-CM | POA: Diagnosis not present

## 2016-09-04 DIAGNOSIS — I70213 Atherosclerosis of native arteries of extremities with intermittent claudication, bilateral legs: Secondary | ICD-10-CM | POA: Diagnosis not present

## 2016-09-10 DIAGNOSIS — M5136 Other intervertebral disc degeneration, lumbar region: Secondary | ICD-10-CM | POA: Diagnosis not present

## 2016-09-10 DIAGNOSIS — M5137 Other intervertebral disc degeneration, lumbosacral region: Secondary | ICD-10-CM | POA: Diagnosis not present

## 2016-09-10 DIAGNOSIS — M7062 Trochanteric bursitis, left hip: Secondary | ICD-10-CM | POA: Diagnosis not present

## 2016-09-10 DIAGNOSIS — M5416 Radiculopathy, lumbar region: Secondary | ICD-10-CM | POA: Diagnosis not present

## 2016-09-10 DIAGNOSIS — E1140 Type 2 diabetes mellitus with diabetic neuropathy, unspecified: Secondary | ICD-10-CM | POA: Diagnosis not present

## 2016-09-10 DIAGNOSIS — M47812 Spondylosis without myelopathy or radiculopathy, cervical region: Secondary | ICD-10-CM | POA: Diagnosis not present

## 2016-09-10 DIAGNOSIS — G894 Chronic pain syndrome: Secondary | ICD-10-CM | POA: Diagnosis not present

## 2016-09-10 DIAGNOSIS — Z79891 Long term (current) use of opiate analgesic: Secondary | ICD-10-CM | POA: Diagnosis not present

## 2016-09-10 DIAGNOSIS — M1612 Unilateral primary osteoarthritis, left hip: Secondary | ICD-10-CM | POA: Diagnosis not present

## 2016-09-10 DIAGNOSIS — Z79899 Other long term (current) drug therapy: Secondary | ICD-10-CM | POA: Diagnosis not present

## 2016-09-10 DIAGNOSIS — M25552 Pain in left hip: Secondary | ICD-10-CM | POA: Diagnosis not present

## 2016-09-10 DIAGNOSIS — M76892 Other specified enthesopathies of left lower limb, excluding foot: Secondary | ICD-10-CM | POA: Diagnosis not present

## 2016-09-10 DIAGNOSIS — M4726 Other spondylosis with radiculopathy, lumbar region: Secondary | ICD-10-CM | POA: Diagnosis not present

## 2016-09-11 DIAGNOSIS — D509 Iron deficiency anemia, unspecified: Secondary | ICD-10-CM | POA: Diagnosis not present

## 2016-09-11 DIAGNOSIS — Z86010 Personal history of colonic polyps: Secondary | ICD-10-CM | POA: Diagnosis not present

## 2016-09-11 DIAGNOSIS — K644 Residual hemorrhoidal skin tags: Secondary | ICD-10-CM | POA: Diagnosis not present

## 2016-09-11 DIAGNOSIS — K5730 Diverticulosis of large intestine without perforation or abscess without bleeding: Secondary | ICD-10-CM | POA: Diagnosis not present

## 2016-09-11 DIAGNOSIS — R197 Diarrhea, unspecified: Secondary | ICD-10-CM | POA: Diagnosis not present

## 2016-09-11 DIAGNOSIS — K449 Diaphragmatic hernia without obstruction or gangrene: Secondary | ICD-10-CM | POA: Diagnosis not present

## 2016-09-11 DIAGNOSIS — D649 Anemia, unspecified: Secondary | ICD-10-CM | POA: Diagnosis not present

## 2016-09-11 DIAGNOSIS — Z9884 Bariatric surgery status: Secondary | ICD-10-CM | POA: Diagnosis not present

## 2016-09-11 DIAGNOSIS — K2950 Unspecified chronic gastritis without bleeding: Secondary | ICD-10-CM | POA: Diagnosis not present

## 2016-09-11 DIAGNOSIS — K635 Polyp of colon: Secondary | ICD-10-CM | POA: Diagnosis not present

## 2016-09-11 DIAGNOSIS — D125 Benign neoplasm of sigmoid colon: Secondary | ICD-10-CM | POA: Diagnosis not present

## 2016-09-11 DIAGNOSIS — K648 Other hemorrhoids: Secondary | ICD-10-CM | POA: Diagnosis not present

## 2016-09-11 DIAGNOSIS — R12 Heartburn: Secondary | ICD-10-CM | POA: Diagnosis not present

## 2016-09-11 DIAGNOSIS — S52551A Other extraarticular fracture of lower end of right radius, initial encounter for closed fracture: Secondary | ICD-10-CM | POA: Diagnosis not present

## 2016-09-17 DIAGNOSIS — E1169 Type 2 diabetes mellitus with other specified complication: Secondary | ICD-10-CM | POA: Diagnosis not present

## 2016-09-17 DIAGNOSIS — I110 Hypertensive heart disease with heart failure: Secondary | ICD-10-CM | POA: Diagnosis not present

## 2016-09-17 DIAGNOSIS — E1351 Other specified diabetes mellitus with diabetic peripheral angiopathy without gangrene: Secondary | ICD-10-CM | POA: Diagnosis not present

## 2016-09-17 DIAGNOSIS — E7800 Pure hypercholesterolemia, unspecified: Secondary | ICD-10-CM | POA: Diagnosis not present

## 2016-09-17 DIAGNOSIS — I5032 Chronic diastolic (congestive) heart failure: Secondary | ICD-10-CM | POA: Diagnosis not present

## 2016-09-17 DIAGNOSIS — G8929 Other chronic pain: Secondary | ICD-10-CM | POA: Diagnosis not present

## 2016-09-17 DIAGNOSIS — J449 Chronic obstructive pulmonary disease, unspecified: Secondary | ICD-10-CM | POA: Diagnosis not present

## 2016-09-20 DIAGNOSIS — R202 Paresthesia of skin: Secondary | ICD-10-CM | POA: Diagnosis not present

## 2016-09-20 DIAGNOSIS — M62830 Muscle spasm of back: Secondary | ICD-10-CM | POA: Diagnosis not present

## 2016-09-20 DIAGNOSIS — M5416 Radiculopathy, lumbar region: Secondary | ICD-10-CM | POA: Diagnosis not present

## 2016-10-03 DIAGNOSIS — M5126 Other intervertebral disc displacement, lumbar region: Secondary | ICD-10-CM | POA: Diagnosis not present

## 2016-10-03 DIAGNOSIS — M5136 Other intervertebral disc degeneration, lumbar region: Secondary | ICD-10-CM | POA: Diagnosis not present

## 2016-10-03 DIAGNOSIS — M5127 Other intervertebral disc displacement, lumbosacral region: Secondary | ICD-10-CM | POA: Diagnosis not present

## 2016-10-04 DIAGNOSIS — M25552 Pain in left hip: Secondary | ICD-10-CM | POA: Diagnosis not present

## 2016-10-09 DIAGNOSIS — M5416 Radiculopathy, lumbar region: Secondary | ICD-10-CM | POA: Diagnosis not present

## 2016-10-09 DIAGNOSIS — M4726 Other spondylosis with radiculopathy, lumbar region: Secondary | ICD-10-CM | POA: Diagnosis not present

## 2016-10-09 DIAGNOSIS — Z79891 Long term (current) use of opiate analgesic: Secondary | ICD-10-CM | POA: Diagnosis not present

## 2016-10-09 DIAGNOSIS — M1612 Unilateral primary osteoarthritis, left hip: Secondary | ICD-10-CM | POA: Diagnosis not present

## 2016-10-09 DIAGNOSIS — M25552 Pain in left hip: Secondary | ICD-10-CM | POA: Diagnosis not present

## 2016-10-09 DIAGNOSIS — M76892 Other specified enthesopathies of left lower limb, excluding foot: Secondary | ICD-10-CM | POA: Diagnosis not present

## 2016-10-09 DIAGNOSIS — E1140 Type 2 diabetes mellitus with diabetic neuropathy, unspecified: Secondary | ICD-10-CM | POA: Diagnosis not present

## 2016-10-09 DIAGNOSIS — M5136 Other intervertebral disc degeneration, lumbar region: Secondary | ICD-10-CM | POA: Diagnosis not present

## 2016-10-09 DIAGNOSIS — G894 Chronic pain syndrome: Secondary | ICD-10-CM | POA: Diagnosis not present

## 2016-10-09 DIAGNOSIS — M5137 Other intervertebral disc degeneration, lumbosacral region: Secondary | ICD-10-CM | POA: Diagnosis not present

## 2016-10-09 DIAGNOSIS — M47812 Spondylosis without myelopathy or radiculopathy, cervical region: Secondary | ICD-10-CM | POA: Diagnosis not present

## 2016-10-09 DIAGNOSIS — M7062 Trochanteric bursitis, left hip: Secondary | ICD-10-CM | POA: Diagnosis not present

## 2016-10-30 DIAGNOSIS — M7062 Trochanteric bursitis, left hip: Secondary | ICD-10-CM | POA: Diagnosis not present

## 2016-10-30 DIAGNOSIS — Z79891 Long term (current) use of opiate analgesic: Secondary | ICD-10-CM | POA: Diagnosis not present

## 2016-11-06 DIAGNOSIS — M5137 Other intervertebral disc degeneration, lumbosacral region: Secondary | ICD-10-CM | POA: Diagnosis not present

## 2016-11-06 DIAGNOSIS — E1140 Type 2 diabetes mellitus with diabetic neuropathy, unspecified: Secondary | ICD-10-CM | POA: Diagnosis not present

## 2016-11-06 DIAGNOSIS — M47812 Spondylosis without myelopathy or radiculopathy, cervical region: Secondary | ICD-10-CM | POA: Diagnosis not present

## 2016-11-06 DIAGNOSIS — G894 Chronic pain syndrome: Secondary | ICD-10-CM | POA: Diagnosis not present

## 2016-11-06 DIAGNOSIS — M5136 Other intervertebral disc degeneration, lumbar region: Secondary | ICD-10-CM | POA: Diagnosis not present

## 2016-11-06 DIAGNOSIS — M5416 Radiculopathy, lumbar region: Secondary | ICD-10-CM | POA: Diagnosis not present

## 2016-11-06 DIAGNOSIS — M76892 Other specified enthesopathies of left lower limb, excluding foot: Secondary | ICD-10-CM | POA: Diagnosis not present

## 2016-11-06 DIAGNOSIS — M4726 Other spondylosis with radiculopathy, lumbar region: Secondary | ICD-10-CM | POA: Diagnosis not present

## 2016-11-06 DIAGNOSIS — M25552 Pain in left hip: Secondary | ICD-10-CM | POA: Diagnosis not present

## 2016-11-06 DIAGNOSIS — Z79891 Long term (current) use of opiate analgesic: Secondary | ICD-10-CM | POA: Diagnosis not present

## 2016-11-06 DIAGNOSIS — M1612 Unilateral primary osteoarthritis, left hip: Secondary | ICD-10-CM | POA: Diagnosis not present

## 2016-11-06 DIAGNOSIS — M7062 Trochanteric bursitis, left hip: Secondary | ICD-10-CM | POA: Diagnosis not present

## 2016-11-13 DIAGNOSIS — Z6841 Body Mass Index (BMI) 40.0 and over, adult: Secondary | ICD-10-CM | POA: Diagnosis not present

## 2016-11-13 DIAGNOSIS — E7800 Pure hypercholesterolemia, unspecified: Secondary | ICD-10-CM | POA: Diagnosis not present

## 2016-11-13 DIAGNOSIS — Z79899 Other long term (current) drug therapy: Secondary | ICD-10-CM | POA: Diagnosis not present

## 2016-11-13 DIAGNOSIS — I110 Hypertensive heart disease with heart failure: Secondary | ICD-10-CM | POA: Diagnosis not present

## 2016-11-13 DIAGNOSIS — Z23 Encounter for immunization: Secondary | ICD-10-CM | POA: Diagnosis not present

## 2016-11-13 DIAGNOSIS — G8929 Other chronic pain: Secondary | ICD-10-CM | POA: Diagnosis not present

## 2016-11-13 DIAGNOSIS — E1169 Type 2 diabetes mellitus with other specified complication: Secondary | ICD-10-CM | POA: Diagnosis not present

## 2016-11-13 DIAGNOSIS — I5032 Chronic diastolic (congestive) heart failure: Secondary | ICD-10-CM | POA: Diagnosis not present

## 2016-11-13 DIAGNOSIS — J449 Chronic obstructive pulmonary disease, unspecified: Secondary | ICD-10-CM | POA: Diagnosis not present

## 2016-11-14 DIAGNOSIS — M2578 Osteophyte, vertebrae: Secondary | ICD-10-CM | POA: Diagnosis not present

## 2016-12-04 DIAGNOSIS — G894 Chronic pain syndrome: Secondary | ICD-10-CM | POA: Diagnosis not present

## 2016-12-04 DIAGNOSIS — M4726 Other spondylosis with radiculopathy, lumbar region: Secondary | ICD-10-CM | POA: Diagnosis not present

## 2016-12-04 DIAGNOSIS — Z79891 Long term (current) use of opiate analgesic: Secondary | ICD-10-CM | POA: Diagnosis not present

## 2016-12-04 DIAGNOSIS — M7062 Trochanteric bursitis, left hip: Secondary | ICD-10-CM | POA: Diagnosis not present

## 2016-12-04 DIAGNOSIS — M25552 Pain in left hip: Secondary | ICD-10-CM | POA: Diagnosis not present

## 2016-12-04 DIAGNOSIS — M5137 Other intervertebral disc degeneration, lumbosacral region: Secondary | ICD-10-CM | POA: Diagnosis not present

## 2016-12-04 DIAGNOSIS — M47812 Spondylosis without myelopathy or radiculopathy, cervical region: Secondary | ICD-10-CM | POA: Diagnosis not present

## 2016-12-04 DIAGNOSIS — E1140 Type 2 diabetes mellitus with diabetic neuropathy, unspecified: Secondary | ICD-10-CM | POA: Diagnosis not present

## 2016-12-04 DIAGNOSIS — M1612 Unilateral primary osteoarthritis, left hip: Secondary | ICD-10-CM | POA: Diagnosis not present

## 2016-12-04 DIAGNOSIS — M5136 Other intervertebral disc degeneration, lumbar region: Secondary | ICD-10-CM | POA: Diagnosis not present

## 2016-12-04 DIAGNOSIS — M5416 Radiculopathy, lumbar region: Secondary | ICD-10-CM | POA: Diagnosis not present

## 2016-12-04 DIAGNOSIS — Z79899 Other long term (current) drug therapy: Secondary | ICD-10-CM | POA: Diagnosis not present

## 2016-12-04 DIAGNOSIS — M76892 Other specified enthesopathies of left lower limb, excluding foot: Secondary | ICD-10-CM | POA: Diagnosis not present

## 2016-12-13 DIAGNOSIS — M5116 Intervertebral disc disorders with radiculopathy, lumbar region: Secondary | ICD-10-CM | POA: Diagnosis not present

## 2016-12-13 DIAGNOSIS — M5416 Radiculopathy, lumbar region: Secondary | ICD-10-CM | POA: Diagnosis not present

## 2016-12-13 DIAGNOSIS — I509 Heart failure, unspecified: Secondary | ICD-10-CM | POA: Diagnosis not present

## 2016-12-13 DIAGNOSIS — Z79891 Long term (current) use of opiate analgesic: Secondary | ICD-10-CM | POA: Diagnosis not present

## 2016-12-13 DIAGNOSIS — J449 Chronic obstructive pulmonary disease, unspecified: Secondary | ICD-10-CM | POA: Diagnosis not present

## 2016-12-13 DIAGNOSIS — M5417 Radiculopathy, lumbosacral region: Secondary | ICD-10-CM | POA: Diagnosis not present

## 2016-12-13 DIAGNOSIS — G4733 Obstructive sleep apnea (adult) (pediatric): Secondary | ICD-10-CM | POA: Diagnosis not present

## 2017-01-09 DIAGNOSIS — M7062 Trochanteric bursitis, left hip: Secondary | ICD-10-CM | POA: Diagnosis not present

## 2017-01-09 DIAGNOSIS — M5137 Other intervertebral disc degeneration, lumbosacral region: Secondary | ICD-10-CM | POA: Diagnosis not present

## 2017-01-09 DIAGNOSIS — M5416 Radiculopathy, lumbar region: Secondary | ICD-10-CM | POA: Diagnosis not present

## 2017-01-09 DIAGNOSIS — M47812 Spondylosis without myelopathy or radiculopathy, cervical region: Secondary | ICD-10-CM | POA: Diagnosis not present

## 2017-01-09 DIAGNOSIS — M25552 Pain in left hip: Secondary | ICD-10-CM | POA: Diagnosis not present

## 2017-01-09 DIAGNOSIS — G894 Chronic pain syndrome: Secondary | ICD-10-CM | POA: Diagnosis not present

## 2017-01-09 DIAGNOSIS — M1612 Unilateral primary osteoarthritis, left hip: Secondary | ICD-10-CM | POA: Diagnosis not present

## 2017-01-09 DIAGNOSIS — M4726 Other spondylosis with radiculopathy, lumbar region: Secondary | ICD-10-CM | POA: Diagnosis not present

## 2017-01-09 DIAGNOSIS — M5136 Other intervertebral disc degeneration, lumbar region: Secondary | ICD-10-CM | POA: Diagnosis not present

## 2017-01-09 DIAGNOSIS — M76892 Other specified enthesopathies of left lower limb, excluding foot: Secondary | ICD-10-CM | POA: Diagnosis not present

## 2017-01-09 DIAGNOSIS — Z79891 Long term (current) use of opiate analgesic: Secondary | ICD-10-CM | POA: Diagnosis not present

## 2017-01-09 DIAGNOSIS — E1140 Type 2 diabetes mellitus with diabetic neuropathy, unspecified: Secondary | ICD-10-CM | POA: Diagnosis not present

## 2017-01-10 DIAGNOSIS — M25561 Pain in right knee: Secondary | ICD-10-CM | POA: Diagnosis not present

## 2017-01-10 DIAGNOSIS — M25562 Pain in left knee: Secondary | ICD-10-CM | POA: Diagnosis not present

## 2017-01-31 DIAGNOSIS — N950 Postmenopausal bleeding: Secondary | ICD-10-CM | POA: Diagnosis not present

## 2017-01-31 DIAGNOSIS — N939 Abnormal uterine and vaginal bleeding, unspecified: Secondary | ICD-10-CM | POA: Diagnosis not present

## 2017-01-31 DIAGNOSIS — Z01419 Encounter for gynecological examination (general) (routine) without abnormal findings: Secondary | ICD-10-CM | POA: Diagnosis not present

## 2017-02-05 DIAGNOSIS — E1140 Type 2 diabetes mellitus with diabetic neuropathy, unspecified: Secondary | ICD-10-CM | POA: Diagnosis not present

## 2017-02-05 DIAGNOSIS — M25562 Pain in left knee: Secondary | ICD-10-CM | POA: Diagnosis not present

## 2017-02-05 DIAGNOSIS — M170 Bilateral primary osteoarthritis of knee: Secondary | ICD-10-CM | POA: Diagnosis not present

## 2017-02-05 DIAGNOSIS — M25561 Pain in right knee: Secondary | ICD-10-CM | POA: Diagnosis not present

## 2017-02-05 DIAGNOSIS — Z79891 Long term (current) use of opiate analgesic: Secondary | ICD-10-CM | POA: Diagnosis not present

## 2017-02-07 DIAGNOSIS — G894 Chronic pain syndrome: Secondary | ICD-10-CM | POA: Diagnosis not present

## 2017-02-07 DIAGNOSIS — M4726 Other spondylosis with radiculopathy, lumbar region: Secondary | ICD-10-CM | POA: Diagnosis not present

## 2017-02-07 DIAGNOSIS — M1612 Unilateral primary osteoarthritis, left hip: Secondary | ICD-10-CM | POA: Diagnosis not present

## 2017-02-07 DIAGNOSIS — M5416 Radiculopathy, lumbar region: Secondary | ICD-10-CM | POA: Diagnosis not present

## 2017-02-07 DIAGNOSIS — M76892 Other specified enthesopathies of left lower limb, excluding foot: Secondary | ICD-10-CM | POA: Diagnosis not present

## 2017-02-07 DIAGNOSIS — M7062 Trochanteric bursitis, left hip: Secondary | ICD-10-CM | POA: Diagnosis not present

## 2017-02-07 DIAGNOSIS — M47812 Spondylosis without myelopathy or radiculopathy, cervical region: Secondary | ICD-10-CM | POA: Diagnosis not present

## 2017-02-07 DIAGNOSIS — M5136 Other intervertebral disc degeneration, lumbar region: Secondary | ICD-10-CM | POA: Diagnosis not present

## 2017-02-07 DIAGNOSIS — Z79891 Long term (current) use of opiate analgesic: Secondary | ICD-10-CM | POA: Diagnosis not present

## 2017-02-07 DIAGNOSIS — M25552 Pain in left hip: Secondary | ICD-10-CM | POA: Diagnosis not present

## 2017-02-07 DIAGNOSIS — M5137 Other intervertebral disc degeneration, lumbosacral region: Secondary | ICD-10-CM | POA: Diagnosis not present

## 2017-02-07 DIAGNOSIS — E1140 Type 2 diabetes mellitus with diabetic neuropathy, unspecified: Secondary | ICD-10-CM | POA: Diagnosis not present

## 2017-02-12 DIAGNOSIS — M170 Bilateral primary osteoarthritis of knee: Secondary | ICD-10-CM | POA: Diagnosis not present

## 2017-02-13 DIAGNOSIS — G8929 Other chronic pain: Secondary | ICD-10-CM | POA: Diagnosis not present

## 2017-02-13 DIAGNOSIS — I110 Hypertensive heart disease with heart failure: Secondary | ICD-10-CM | POA: Diagnosis not present

## 2017-02-13 DIAGNOSIS — Z79899 Other long term (current) drug therapy: Secondary | ICD-10-CM | POA: Diagnosis not present

## 2017-02-13 DIAGNOSIS — J9610 Chronic respiratory failure, unspecified whether with hypoxia or hypercapnia: Secondary | ICD-10-CM | POA: Diagnosis not present

## 2017-02-13 DIAGNOSIS — J449 Chronic obstructive pulmonary disease, unspecified: Secondary | ICD-10-CM | POA: Diagnosis not present

## 2017-02-13 DIAGNOSIS — Z6841 Body Mass Index (BMI) 40.0 and over, adult: Secondary | ICD-10-CM | POA: Diagnosis not present

## 2017-02-13 DIAGNOSIS — L659 Nonscarring hair loss, unspecified: Secondary | ICD-10-CM | POA: Diagnosis not present

## 2017-02-13 DIAGNOSIS — I5032 Chronic diastolic (congestive) heart failure: Secondary | ICD-10-CM | POA: Diagnosis not present

## 2017-02-13 DIAGNOSIS — E7800 Pure hypercholesterolemia, unspecified: Secondary | ICD-10-CM | POA: Diagnosis not present

## 2017-02-13 DIAGNOSIS — E559 Vitamin D deficiency, unspecified: Secondary | ICD-10-CM | POA: Diagnosis not present

## 2017-02-13 DIAGNOSIS — E1169 Type 2 diabetes mellitus with other specified complication: Secondary | ICD-10-CM | POA: Diagnosis not present

## 2017-02-19 DIAGNOSIS — M25562 Pain in left knee: Secondary | ICD-10-CM | POA: Diagnosis not present

## 2017-02-19 DIAGNOSIS — M25561 Pain in right knee: Secondary | ICD-10-CM | POA: Diagnosis not present

## 2017-02-19 DIAGNOSIS — M170 Bilateral primary osteoarthritis of knee: Secondary | ICD-10-CM | POA: Diagnosis not present

## 2017-03-07 DIAGNOSIS — M25552 Pain in left hip: Secondary | ICD-10-CM | POA: Diagnosis not present

## 2017-03-07 DIAGNOSIS — M4726 Other spondylosis with radiculopathy, lumbar region: Secondary | ICD-10-CM | POA: Diagnosis not present

## 2017-03-07 DIAGNOSIS — M47812 Spondylosis without myelopathy or radiculopathy, cervical region: Secondary | ICD-10-CM | POA: Diagnosis not present

## 2017-03-07 DIAGNOSIS — M5136 Other intervertebral disc degeneration, lumbar region: Secondary | ICD-10-CM | POA: Diagnosis not present

## 2017-03-07 DIAGNOSIS — G894 Chronic pain syndrome: Secondary | ICD-10-CM | POA: Diagnosis not present

## 2017-03-07 DIAGNOSIS — E1140 Type 2 diabetes mellitus with diabetic neuropathy, unspecified: Secondary | ICD-10-CM | POA: Diagnosis not present

## 2017-03-07 DIAGNOSIS — Z79891 Long term (current) use of opiate analgesic: Secondary | ICD-10-CM | POA: Diagnosis not present

## 2017-03-07 DIAGNOSIS — M5416 Radiculopathy, lumbar region: Secondary | ICD-10-CM | POA: Diagnosis not present

## 2017-03-07 DIAGNOSIS — M5137 Other intervertebral disc degeneration, lumbosacral region: Secondary | ICD-10-CM | POA: Diagnosis not present

## 2017-03-07 DIAGNOSIS — M7062 Trochanteric bursitis, left hip: Secondary | ICD-10-CM | POA: Diagnosis not present

## 2017-03-07 DIAGNOSIS — M76892 Other specified enthesopathies of left lower limb, excluding foot: Secondary | ICD-10-CM | POA: Diagnosis not present

## 2017-03-07 DIAGNOSIS — M1612 Unilateral primary osteoarthritis, left hip: Secondary | ICD-10-CM | POA: Diagnosis not present

## 2017-03-11 DIAGNOSIS — N83201 Unspecified ovarian cyst, right side: Secondary | ICD-10-CM | POA: Diagnosis not present

## 2017-03-11 DIAGNOSIS — N950 Postmenopausal bleeding: Secondary | ICD-10-CM | POA: Diagnosis not present

## 2017-03-11 DIAGNOSIS — N888 Other specified noninflammatory disorders of cervix uteri: Secondary | ICD-10-CM | POA: Diagnosis not present

## 2017-03-11 DIAGNOSIS — N83202 Unspecified ovarian cyst, left side: Secondary | ICD-10-CM | POA: Diagnosis not present

## 2017-03-13 DIAGNOSIS — L649 Androgenic alopecia, unspecified: Secondary | ICD-10-CM | POA: Diagnosis not present

## 2017-03-13 DIAGNOSIS — L648 Other androgenic alopecia: Secondary | ICD-10-CM | POA: Diagnosis not present

## 2017-03-13 DIAGNOSIS — L732 Hidradenitis suppurativa: Secondary | ICD-10-CM | POA: Diagnosis not present

## 2017-03-27 DIAGNOSIS — R102 Pelvic and perineal pain: Secondary | ICD-10-CM | POA: Diagnosis not present

## 2017-03-27 DIAGNOSIS — N950 Postmenopausal bleeding: Secondary | ICD-10-CM | POA: Diagnosis not present

## 2017-03-27 DIAGNOSIS — Z6841 Body Mass Index (BMI) 40.0 and over, adult: Secondary | ICD-10-CM | POA: Diagnosis not present

## 2017-04-03 DIAGNOSIS — M4726 Other spondylosis with radiculopathy, lumbar region: Secondary | ICD-10-CM | POA: Diagnosis not present

## 2017-04-03 DIAGNOSIS — M76892 Other specified enthesopathies of left lower limb, excluding foot: Secondary | ICD-10-CM | POA: Diagnosis not present

## 2017-04-03 DIAGNOSIS — M7062 Trochanteric bursitis, left hip: Secondary | ICD-10-CM | POA: Diagnosis not present

## 2017-04-03 DIAGNOSIS — Z79891 Long term (current) use of opiate analgesic: Secondary | ICD-10-CM | POA: Diagnosis not present

## 2017-04-03 DIAGNOSIS — G894 Chronic pain syndrome: Secondary | ICD-10-CM | POA: Diagnosis not present

## 2017-04-03 DIAGNOSIS — E1140 Type 2 diabetes mellitus with diabetic neuropathy, unspecified: Secondary | ICD-10-CM | POA: Diagnosis not present

## 2017-04-03 DIAGNOSIS — M5137 Other intervertebral disc degeneration, lumbosacral region: Secondary | ICD-10-CM | POA: Diagnosis not present

## 2017-04-03 DIAGNOSIS — M5136 Other intervertebral disc degeneration, lumbar region: Secondary | ICD-10-CM | POA: Diagnosis not present

## 2017-04-03 DIAGNOSIS — M1612 Unilateral primary osteoarthritis, left hip: Secondary | ICD-10-CM | POA: Diagnosis not present

## 2017-04-03 DIAGNOSIS — M25552 Pain in left hip: Secondary | ICD-10-CM | POA: Diagnosis not present

## 2017-04-03 DIAGNOSIS — M47812 Spondylosis without myelopathy or radiculopathy, cervical region: Secondary | ICD-10-CM | POA: Diagnosis not present

## 2017-04-03 DIAGNOSIS — M5416 Radiculopathy, lumbar region: Secondary | ICD-10-CM | POA: Diagnosis not present

## 2017-04-16 DIAGNOSIS — E559 Vitamin D deficiency, unspecified: Secondary | ICD-10-CM | POA: Diagnosis not present

## 2017-04-16 DIAGNOSIS — I5032 Chronic diastolic (congestive) heart failure: Secondary | ICD-10-CM | POA: Diagnosis not present

## 2017-04-16 DIAGNOSIS — E7800 Pure hypercholesterolemia, unspecified: Secondary | ICD-10-CM | POA: Diagnosis not present

## 2017-04-16 DIAGNOSIS — Z7982 Long term (current) use of aspirin: Secondary | ICD-10-CM | POA: Diagnosis not present

## 2017-04-16 DIAGNOSIS — Z79899 Other long term (current) drug therapy: Secondary | ICD-10-CM | POA: Diagnosis not present

## 2017-04-16 DIAGNOSIS — Z794 Long term (current) use of insulin: Secondary | ICD-10-CM | POA: Diagnosis not present

## 2017-04-16 DIAGNOSIS — Z01810 Encounter for preprocedural cardiovascular examination: Secondary | ICD-10-CM | POA: Diagnosis not present

## 2017-04-16 DIAGNOSIS — E1169 Type 2 diabetes mellitus with other specified complication: Secondary | ICD-10-CM | POA: Diagnosis not present

## 2017-04-16 DIAGNOSIS — I110 Hypertensive heart disease with heart failure: Secondary | ICD-10-CM | POA: Diagnosis not present

## 2017-04-18 DIAGNOSIS — I739 Peripheral vascular disease, unspecified: Secondary | ICD-10-CM | POA: Diagnosis not present

## 2017-04-18 DIAGNOSIS — I70213 Atherosclerosis of native arteries of extremities with intermittent claudication, bilateral legs: Secondary | ICD-10-CM | POA: Diagnosis not present

## 2017-04-30 DIAGNOSIS — K219 Gastro-esophageal reflux disease without esophagitis: Secondary | ICD-10-CM | POA: Diagnosis not present

## 2017-04-30 DIAGNOSIS — N183 Chronic kidney disease, stage 3 (moderate): Secondary | ICD-10-CM | POA: Diagnosis not present

## 2017-04-30 DIAGNOSIS — R102 Pelvic and perineal pain: Secondary | ICD-10-CM | POA: Diagnosis not present

## 2017-04-30 DIAGNOSIS — I129 Hypertensive chronic kidney disease with stage 1 through stage 4 chronic kidney disease, or unspecified chronic kidney disease: Secondary | ICD-10-CM | POA: Diagnosis not present

## 2017-04-30 DIAGNOSIS — Z79899 Other long term (current) drug therapy: Secondary | ICD-10-CM | POA: Diagnosis not present

## 2017-04-30 DIAGNOSIS — N950 Postmenopausal bleeding: Secondary | ICD-10-CM | POA: Diagnosis not present

## 2017-04-30 DIAGNOSIS — E1122 Type 2 diabetes mellitus with diabetic chronic kidney disease: Secondary | ICD-10-CM | POA: Diagnosis not present

## 2017-04-30 DIAGNOSIS — Z6841 Body Mass Index (BMI) 40.0 and over, adult: Secondary | ICD-10-CM | POA: Diagnosis not present

## 2017-05-01 DIAGNOSIS — M7062 Trochanteric bursitis, left hip: Secondary | ICD-10-CM | POA: Diagnosis not present

## 2017-05-01 DIAGNOSIS — M5137 Other intervertebral disc degeneration, lumbosacral region: Secondary | ICD-10-CM | POA: Diagnosis not present

## 2017-05-01 DIAGNOSIS — M4726 Other spondylosis with radiculopathy, lumbar region: Secondary | ICD-10-CM | POA: Diagnosis not present

## 2017-05-01 DIAGNOSIS — Z79891 Long term (current) use of opiate analgesic: Secondary | ICD-10-CM | POA: Diagnosis not present

## 2017-05-01 DIAGNOSIS — M1612 Unilateral primary osteoarthritis, left hip: Secondary | ICD-10-CM | POA: Diagnosis not present

## 2017-05-01 DIAGNOSIS — E1140 Type 2 diabetes mellitus with diabetic neuropathy, unspecified: Secondary | ICD-10-CM | POA: Diagnosis not present

## 2017-05-01 DIAGNOSIS — M5416 Radiculopathy, lumbar region: Secondary | ICD-10-CM | POA: Diagnosis not present

## 2017-05-01 DIAGNOSIS — M5136 Other intervertebral disc degeneration, lumbar region: Secondary | ICD-10-CM | POA: Diagnosis not present

## 2017-05-01 DIAGNOSIS — M47812 Spondylosis without myelopathy or radiculopathy, cervical region: Secondary | ICD-10-CM | POA: Diagnosis not present

## 2017-05-01 DIAGNOSIS — M76892 Other specified enthesopathies of left lower limb, excluding foot: Secondary | ICD-10-CM | POA: Diagnosis not present

## 2017-05-01 DIAGNOSIS — G894 Chronic pain syndrome: Secondary | ICD-10-CM | POA: Diagnosis not present

## 2017-05-01 DIAGNOSIS — M25552 Pain in left hip: Secondary | ICD-10-CM | POA: Diagnosis not present

## 2017-05-07 DIAGNOSIS — Z01810 Encounter for preprocedural cardiovascular examination: Secondary | ICD-10-CM | POA: Diagnosis not present

## 2017-05-07 DIAGNOSIS — R9431 Abnormal electrocardiogram [ECG] [EKG]: Secondary | ICD-10-CM | POA: Diagnosis not present

## 2017-05-13 DIAGNOSIS — D261 Other benign neoplasm of corpus uteri: Secondary | ICD-10-CM | POA: Diagnosis not present

## 2017-05-13 DIAGNOSIS — E1122 Type 2 diabetes mellitus with diabetic chronic kidney disease: Secondary | ICD-10-CM | POA: Diagnosis not present

## 2017-05-13 DIAGNOSIS — K219 Gastro-esophageal reflux disease without esophagitis: Secondary | ICD-10-CM | POA: Diagnosis not present

## 2017-05-13 DIAGNOSIS — N183 Chronic kidney disease, stage 3 (moderate): Secondary | ICD-10-CM | POA: Diagnosis not present

## 2017-05-13 DIAGNOSIS — D251 Intramural leiomyoma of uterus: Secondary | ICD-10-CM | POA: Diagnosis not present

## 2017-05-13 DIAGNOSIS — Z79899 Other long term (current) drug therapy: Secondary | ICD-10-CM | POA: Diagnosis not present

## 2017-05-13 DIAGNOSIS — N939 Abnormal uterine and vaginal bleeding, unspecified: Secondary | ICD-10-CM | POA: Diagnosis not present

## 2017-05-13 DIAGNOSIS — N72 Inflammatory disease of cervix uteri: Secondary | ICD-10-CM | POA: Diagnosis not present

## 2017-05-13 DIAGNOSIS — R102 Pelvic and perineal pain: Secondary | ICD-10-CM | POA: Diagnosis not present

## 2017-05-13 DIAGNOSIS — N736 Female pelvic peritoneal adhesions (postinfective): Secondary | ICD-10-CM | POA: Diagnosis not present

## 2017-05-13 DIAGNOSIS — N950 Postmenopausal bleeding: Secondary | ICD-10-CM | POA: Diagnosis not present

## 2017-05-13 DIAGNOSIS — I129 Hypertensive chronic kidney disease with stage 1 through stage 4 chronic kidney disease, or unspecified chronic kidney disease: Secondary | ICD-10-CM | POA: Diagnosis not present

## 2017-05-19 DIAGNOSIS — N183 Chronic kidney disease, stage 3 (moderate): Secondary | ICD-10-CM | POA: Diagnosis not present

## 2017-05-19 DIAGNOSIS — J9601 Acute respiratory failure with hypoxia: Secondary | ICD-10-CM | POA: Diagnosis not present

## 2017-05-19 DIAGNOSIS — R509 Fever, unspecified: Secondary | ICD-10-CM | POA: Diagnosis not present

## 2017-05-19 DIAGNOSIS — R0600 Dyspnea, unspecified: Secondary | ICD-10-CM | POA: Diagnosis not present

## 2017-05-19 DIAGNOSIS — E1122 Type 2 diabetes mellitus with diabetic chronic kidney disease: Secondary | ICD-10-CM | POA: Diagnosis present

## 2017-05-19 DIAGNOSIS — G4730 Sleep apnea, unspecified: Secondary | ICD-10-CM | POA: Diagnosis present

## 2017-05-19 DIAGNOSIS — L0390 Cellulitis, unspecified: Secondary | ICD-10-CM | POA: Diagnosis not present

## 2017-05-19 DIAGNOSIS — T8141XA Infection following a procedure, superficial incisional surgical site, initial encounter: Secondary | ICD-10-CM | POA: Diagnosis present

## 2017-05-19 DIAGNOSIS — E785 Hyperlipidemia, unspecified: Secondary | ICD-10-CM | POA: Diagnosis present

## 2017-05-19 DIAGNOSIS — Z6841 Body Mass Index (BMI) 40.0 and over, adult: Secondary | ICD-10-CM | POA: Diagnosis not present

## 2017-05-19 DIAGNOSIS — I129 Hypertensive chronic kidney disease with stage 1 through stage 4 chronic kidney disease, or unspecified chronic kidney disease: Secondary | ICD-10-CM | POA: Diagnosis not present

## 2017-05-19 DIAGNOSIS — A419 Sepsis, unspecified organism: Secondary | ICD-10-CM | POA: Diagnosis not present

## 2017-05-19 DIAGNOSIS — I739 Peripheral vascular disease, unspecified: Secondary | ICD-10-CM | POA: Diagnosis not present

## 2017-05-19 DIAGNOSIS — D62 Acute posthemorrhagic anemia: Secondary | ICD-10-CM | POA: Diagnosis not present

## 2017-05-19 DIAGNOSIS — F17210 Nicotine dependence, cigarettes, uncomplicated: Secondary | ICD-10-CM | POA: Diagnosis present

## 2017-05-19 DIAGNOSIS — I2510 Atherosclerotic heart disease of native coronary artery without angina pectoris: Secondary | ICD-10-CM | POA: Diagnosis present

## 2017-05-19 DIAGNOSIS — N99840 Postprocedural hematoma of a genitourinary system organ or structure following a genitourinary system procedure: Secondary | ICD-10-CM | POA: Diagnosis not present

## 2017-05-19 DIAGNOSIS — L03311 Cellulitis of abdominal wall: Secondary | ICD-10-CM | POA: Diagnosis not present

## 2017-05-19 DIAGNOSIS — K219 Gastro-esophageal reflux disease without esophagitis: Secondary | ICD-10-CM | POA: Diagnosis present

## 2017-05-19 DIAGNOSIS — T8144XA Sepsis following a procedure, initial encounter: Secondary | ICD-10-CM | POA: Diagnosis not present

## 2017-05-19 DIAGNOSIS — E559 Vitamin D deficiency, unspecified: Secondary | ICD-10-CM | POA: Diagnosis present

## 2017-05-19 DIAGNOSIS — E1151 Type 2 diabetes mellitus with diabetic peripheral angiopathy without gangrene: Secondary | ICD-10-CM | POA: Diagnosis present

## 2017-05-19 DIAGNOSIS — K5900 Constipation, unspecified: Secondary | ICD-10-CM | POA: Diagnosis not present

## 2017-05-19 DIAGNOSIS — E1140 Type 2 diabetes mellitus with diabetic neuropathy, unspecified: Secondary | ICD-10-CM | POA: Diagnosis not present

## 2017-05-19 DIAGNOSIS — J449 Chronic obstructive pulmonary disease, unspecified: Secondary | ICD-10-CM | POA: Diagnosis present

## 2017-05-29 DIAGNOSIS — I509 Heart failure, unspecified: Secondary | ICD-10-CM | POA: Diagnosis not present

## 2017-05-29 DIAGNOSIS — E1122 Type 2 diabetes mellitus with diabetic chronic kidney disease: Secondary | ICD-10-CM | POA: Diagnosis not present

## 2017-05-29 DIAGNOSIS — N189 Chronic kidney disease, unspecified: Secondary | ICD-10-CM | POA: Diagnosis not present

## 2017-05-29 DIAGNOSIS — Z48816 Encounter for surgical aftercare following surgery on the genitourinary system: Secondary | ICD-10-CM | POA: Diagnosis not present

## 2017-05-29 DIAGNOSIS — I130 Hypertensive heart and chronic kidney disease with heart failure and stage 1 through stage 4 chronic kidney disease, or unspecified chronic kidney disease: Secondary | ICD-10-CM | POA: Diagnosis not present

## 2017-05-29 DIAGNOSIS — E1151 Type 2 diabetes mellitus with diabetic peripheral angiopathy without gangrene: Secondary | ICD-10-CM | POA: Diagnosis not present

## 2017-06-04 DIAGNOSIS — Z7982 Long term (current) use of aspirin: Secondary | ICD-10-CM | POA: Diagnosis not present

## 2017-06-04 DIAGNOSIS — D279 Benign neoplasm of unspecified ovary: Secondary | ICD-10-CM | POA: Diagnosis not present

## 2017-06-04 DIAGNOSIS — Z803 Family history of malignant neoplasm of breast: Secondary | ICD-10-CM | POA: Diagnosis not present

## 2017-06-04 DIAGNOSIS — Z885 Allergy status to narcotic agent status: Secondary | ICD-10-CM | POA: Diagnosis not present

## 2017-06-04 DIAGNOSIS — Z4889 Encounter for other specified surgical aftercare: Secondary | ICD-10-CM | POA: Diagnosis not present

## 2017-06-04 DIAGNOSIS — Z6841 Body Mass Index (BMI) 40.0 and over, adult: Secondary | ICD-10-CM | POA: Diagnosis not present

## 2017-06-04 DIAGNOSIS — Z8249 Family history of ischemic heart disease and other diseases of the circulatory system: Secondary | ICD-10-CM | POA: Diagnosis not present

## 2017-06-04 DIAGNOSIS — D62 Acute posthemorrhagic anemia: Secondary | ICD-10-CM | POA: Diagnosis not present

## 2017-06-04 DIAGNOSIS — B372 Candidiasis of skin and nail: Secondary | ICD-10-CM | POA: Diagnosis not present

## 2017-06-04 DIAGNOSIS — Z8041 Family history of malignant neoplasm of ovary: Secondary | ICD-10-CM | POA: Diagnosis not present

## 2017-06-04 DIAGNOSIS — Z79899 Other long term (current) drug therapy: Secondary | ICD-10-CM | POA: Diagnosis not present

## 2017-06-04 DIAGNOSIS — Z794 Long term (current) use of insulin: Secondary | ICD-10-CM | POA: Diagnosis not present

## 2017-06-05 DIAGNOSIS — Z794 Long term (current) use of insulin: Secondary | ICD-10-CM | POA: Diagnosis not present

## 2017-06-05 DIAGNOSIS — Z7982 Long term (current) use of aspirin: Secondary | ICD-10-CM | POA: Diagnosis not present

## 2017-06-05 DIAGNOSIS — R609 Edema, unspecified: Secondary | ICD-10-CM | POA: Diagnosis not present

## 2017-06-05 DIAGNOSIS — E7800 Pure hypercholesterolemia, unspecified: Secondary | ICD-10-CM | POA: Diagnosis not present

## 2017-06-05 DIAGNOSIS — Z79899 Other long term (current) drug therapy: Secondary | ICD-10-CM | POA: Diagnosis not present

## 2017-06-05 DIAGNOSIS — Z7951 Long term (current) use of inhaled steroids: Secondary | ICD-10-CM | POA: Diagnosis not present

## 2017-06-05 DIAGNOSIS — E1169 Type 2 diabetes mellitus with other specified complication: Secondary | ICD-10-CM | POA: Diagnosis not present

## 2017-06-05 DIAGNOSIS — Z885 Allergy status to narcotic agent status: Secondary | ICD-10-CM | POA: Diagnosis not present

## 2017-06-05 DIAGNOSIS — Z6841 Body Mass Index (BMI) 40.0 and over, adult: Secondary | ICD-10-CM | POA: Diagnosis not present

## 2017-06-05 DIAGNOSIS — A419 Sepsis, unspecified organism: Secondary | ICD-10-CM | POA: Diagnosis not present

## 2017-06-06 DIAGNOSIS — M5137 Other intervertebral disc degeneration, lumbosacral region: Secondary | ICD-10-CM | POA: Diagnosis not present

## 2017-06-06 DIAGNOSIS — Z79891 Long term (current) use of opiate analgesic: Secondary | ICD-10-CM | POA: Diagnosis not present

## 2017-06-06 DIAGNOSIS — M25552 Pain in left hip: Secondary | ICD-10-CM | POA: Diagnosis not present

## 2017-06-06 DIAGNOSIS — M7062 Trochanteric bursitis, left hip: Secondary | ICD-10-CM | POA: Diagnosis not present

## 2017-06-06 DIAGNOSIS — M76892 Other specified enthesopathies of left lower limb, excluding foot: Secondary | ICD-10-CM | POA: Diagnosis not present

## 2017-06-06 DIAGNOSIS — E1140 Type 2 diabetes mellitus with diabetic neuropathy, unspecified: Secondary | ICD-10-CM | POA: Diagnosis not present

## 2017-06-06 DIAGNOSIS — G894 Chronic pain syndrome: Secondary | ICD-10-CM | POA: Diagnosis not present

## 2017-06-06 DIAGNOSIS — M47812 Spondylosis without myelopathy or radiculopathy, cervical region: Secondary | ICD-10-CM | POA: Diagnosis not present

## 2017-06-06 DIAGNOSIS — M5416 Radiculopathy, lumbar region: Secondary | ICD-10-CM | POA: Diagnosis not present

## 2017-06-06 DIAGNOSIS — M4726 Other spondylosis with radiculopathy, lumbar region: Secondary | ICD-10-CM | POA: Diagnosis not present

## 2017-06-06 DIAGNOSIS — M1612 Unilateral primary osteoarthritis, left hip: Secondary | ICD-10-CM | POA: Diagnosis not present

## 2017-06-06 DIAGNOSIS — M5136 Other intervertebral disc degeneration, lumbar region: Secondary | ICD-10-CM | POA: Diagnosis not present

## 2017-07-05 DIAGNOSIS — M7062 Trochanteric bursitis, left hip: Secondary | ICD-10-CM | POA: Diagnosis not present

## 2017-07-05 DIAGNOSIS — M1612 Unilateral primary osteoarthritis, left hip: Secondary | ICD-10-CM | POA: Diagnosis not present

## 2017-07-05 DIAGNOSIS — M5137 Other intervertebral disc degeneration, lumbosacral region: Secondary | ICD-10-CM | POA: Diagnosis not present

## 2017-07-05 DIAGNOSIS — Z79891 Long term (current) use of opiate analgesic: Secondary | ICD-10-CM | POA: Diagnosis not present

## 2017-07-05 DIAGNOSIS — E1140 Type 2 diabetes mellitus with diabetic neuropathy, unspecified: Secondary | ICD-10-CM | POA: Diagnosis not present

## 2017-07-05 DIAGNOSIS — M47812 Spondylosis without myelopathy or radiculopathy, cervical region: Secondary | ICD-10-CM | POA: Diagnosis not present

## 2017-07-05 DIAGNOSIS — M76892 Other specified enthesopathies of left lower limb, excluding foot: Secondary | ICD-10-CM | POA: Diagnosis not present

## 2017-07-05 DIAGNOSIS — M25552 Pain in left hip: Secondary | ICD-10-CM | POA: Diagnosis not present

## 2017-07-05 DIAGNOSIS — M5416 Radiculopathy, lumbar region: Secondary | ICD-10-CM | POA: Diagnosis not present

## 2017-07-05 DIAGNOSIS — G894 Chronic pain syndrome: Secondary | ICD-10-CM | POA: Diagnosis not present

## 2017-07-05 DIAGNOSIS — M5136 Other intervertebral disc degeneration, lumbar region: Secondary | ICD-10-CM | POA: Diagnosis not present

## 2017-07-05 DIAGNOSIS — M4726 Other spondylosis with radiculopathy, lumbar region: Secondary | ICD-10-CM | POA: Diagnosis not present

## 2017-08-05 DIAGNOSIS — Z79891 Long term (current) use of opiate analgesic: Secondary | ICD-10-CM | POA: Diagnosis not present

## 2017-08-05 DIAGNOSIS — M5416 Radiculopathy, lumbar region: Secondary | ICD-10-CM | POA: Diagnosis not present

## 2017-08-05 DIAGNOSIS — M7062 Trochanteric bursitis, left hip: Secondary | ICD-10-CM | POA: Diagnosis not present

## 2017-08-05 DIAGNOSIS — M5137 Other intervertebral disc degeneration, lumbosacral region: Secondary | ICD-10-CM | POA: Diagnosis not present

## 2017-08-05 DIAGNOSIS — M25562 Pain in left knee: Secondary | ICD-10-CM | POA: Diagnosis not present

## 2017-08-05 DIAGNOSIS — G894 Chronic pain syndrome: Secondary | ICD-10-CM | POA: Diagnosis not present

## 2017-08-05 DIAGNOSIS — M4726 Other spondylosis with radiculopathy, lumbar region: Secondary | ICD-10-CM | POA: Diagnosis not present

## 2017-08-05 DIAGNOSIS — E1140 Type 2 diabetes mellitus with diabetic neuropathy, unspecified: Secondary | ICD-10-CM | POA: Diagnosis not present

## 2017-08-05 DIAGNOSIS — M47812 Spondylosis without myelopathy or radiculopathy, cervical region: Secondary | ICD-10-CM | POA: Diagnosis not present

## 2017-08-05 DIAGNOSIS — M5136 Other intervertebral disc degeneration, lumbar region: Secondary | ICD-10-CM | POA: Diagnosis not present

## 2017-08-05 DIAGNOSIS — M1612 Unilateral primary osteoarthritis, left hip: Secondary | ICD-10-CM | POA: Diagnosis not present

## 2017-08-05 DIAGNOSIS — M25552 Pain in left hip: Secondary | ICD-10-CM | POA: Diagnosis not present

## 2017-08-05 DIAGNOSIS — M76892 Other specified enthesopathies of left lower limb, excluding foot: Secondary | ICD-10-CM | POA: Diagnosis not present

## 2017-09-03 DIAGNOSIS — M4726 Other spondylosis with radiculopathy, lumbar region: Secondary | ICD-10-CM | POA: Diagnosis not present

## 2017-09-03 DIAGNOSIS — M170 Bilateral primary osteoarthritis of knee: Secondary | ICD-10-CM | POA: Diagnosis not present

## 2017-09-03 DIAGNOSIS — G894 Chronic pain syndrome: Secondary | ICD-10-CM | POA: Diagnosis not present

## 2017-09-03 DIAGNOSIS — M9973 Connective tissue and disc stenosis of intervertebral foramina of lumbar region: Secondary | ICD-10-CM | POA: Diagnosis not present

## 2017-09-03 DIAGNOSIS — M25562 Pain in left knee: Secondary | ICD-10-CM | POA: Diagnosis not present

## 2017-09-03 DIAGNOSIS — M1612 Unilateral primary osteoarthritis, left hip: Secondary | ICD-10-CM | POA: Diagnosis not present

## 2017-09-03 DIAGNOSIS — M76892 Other specified enthesopathies of left lower limb, excluding foot: Secondary | ICD-10-CM | POA: Diagnosis not present

## 2017-09-03 DIAGNOSIS — M25561 Pain in right knee: Secondary | ICD-10-CM | POA: Diagnosis not present

## 2017-09-03 DIAGNOSIS — M259 Joint disorder, unspecified: Secondary | ICD-10-CM | POA: Diagnosis not present

## 2017-09-03 DIAGNOSIS — M5416 Radiculopathy, lumbar region: Secondary | ICD-10-CM | POA: Diagnosis not present

## 2017-09-03 DIAGNOSIS — M533 Sacrococcygeal disorders, not elsewhere classified: Secondary | ICD-10-CM | POA: Diagnosis not present

## 2017-09-03 DIAGNOSIS — M5417 Radiculopathy, lumbosacral region: Secondary | ICD-10-CM | POA: Diagnosis not present

## 2017-09-09 DIAGNOSIS — R252 Cramp and spasm: Secondary | ICD-10-CM | POA: Diagnosis not present

## 2017-09-09 DIAGNOSIS — I781 Nevus, non-neoplastic: Secondary | ICD-10-CM | POA: Diagnosis not present

## 2017-09-09 DIAGNOSIS — I8392 Asymptomatic varicose veins of left lower extremity: Secondary | ICD-10-CM | POA: Diagnosis not present

## 2017-09-10 DIAGNOSIS — M170 Bilateral primary osteoarthritis of knee: Secondary | ICD-10-CM | POA: Diagnosis not present

## 2017-09-10 DIAGNOSIS — Z79891 Long term (current) use of opiate analgesic: Secondary | ICD-10-CM | POA: Diagnosis not present
